# Patient Record
Sex: Female | Born: 1945
Health system: Southern US, Community
[De-identification: ages and names within clinical notes are randomized; demographics above are authoritative.]

## PROBLEM LIST (undated history)

## (undated) DIAGNOSIS — E7800 Pure hypercholesterolemia, unspecified: Secondary | ICD-10-CM

## (undated) DIAGNOSIS — N63 Unspecified lump in unspecified breast: Secondary | ICD-10-CM

## (undated) DIAGNOSIS — K219 Gastro-esophageal reflux disease without esophagitis: Secondary | ICD-10-CM

## (undated) DIAGNOSIS — M1990 Unspecified osteoarthritis, unspecified site: Secondary | ICD-10-CM

## (undated) DIAGNOSIS — Z87891 Personal history of nicotine dependence: Secondary | ICD-10-CM

## (undated) DIAGNOSIS — J439 Emphysema, unspecified: Secondary | ICD-10-CM

## (undated) DIAGNOSIS — R911 Solitary pulmonary nodule: Secondary | ICD-10-CM

## (undated) DIAGNOSIS — N390 Urinary tract infection, site not specified: Secondary | ICD-10-CM

## (undated) DIAGNOSIS — D219 Benign neoplasm of connective and other soft tissue, unspecified: Secondary | ICD-10-CM

## (undated) DIAGNOSIS — J449 Chronic obstructive pulmonary disease, unspecified: Secondary | ICD-10-CM

## (undated) DIAGNOSIS — M810 Age-related osteoporosis without current pathological fracture: Secondary | ICD-10-CM

## (undated) HISTORY — DX: Unspecified osteoarthritis, unspecified site: M19.90

## (undated) HISTORY — DX: Personal history of nicotine dependence: Z87.891

## (undated) HISTORY — PX: BREAST BIOPSY: SHX20

## (undated) HISTORY — DX: Pure hypercholesterolemia, unspecified: E78.00

## (undated) HISTORY — DX: Urinary tract infection, site not specified: N39.0

## (undated) HISTORY — DX: Benign neoplasm of connective and other soft tissue, unspecified: D21.9

## (undated) HISTORY — DX: Age-related osteoporosis without current pathological fracture: M81.0

## (undated) HISTORY — DX: Unspecified lump in unspecified breast: N63.0

## (undated) HISTORY — PX: TONSILLECTOMY: SHX5217

## (undated) HISTORY — PX: BREAST SURGERY: SHX581

---

## 1973-07-14 HISTORY — PX: BREAST BIOPSY: SHX20

## 1973-07-14 HISTORY — PX: BREAST EXCISIONAL BIOPSY: SUR124

## 1995-07-15 HISTORY — PX: ABDOMINAL HYSTERECTOMY: SHX81

## 2005-11-01 ENCOUNTER — Emergency Department: Payer: Self-pay | Admitting: Emergency Medicine

## 2006-02-15 IMAGING — CR DG ELBOW COMPLETE 3+V*L*
1 series · 6 of 6 positions shown · non-contrast
Comparison: none

REASON FOR EXAM: INJURY
COMMENTS:

[Series 1: view not recorded · 0.17mm/px · 6 of 6 slices shown]
[im 1/6]
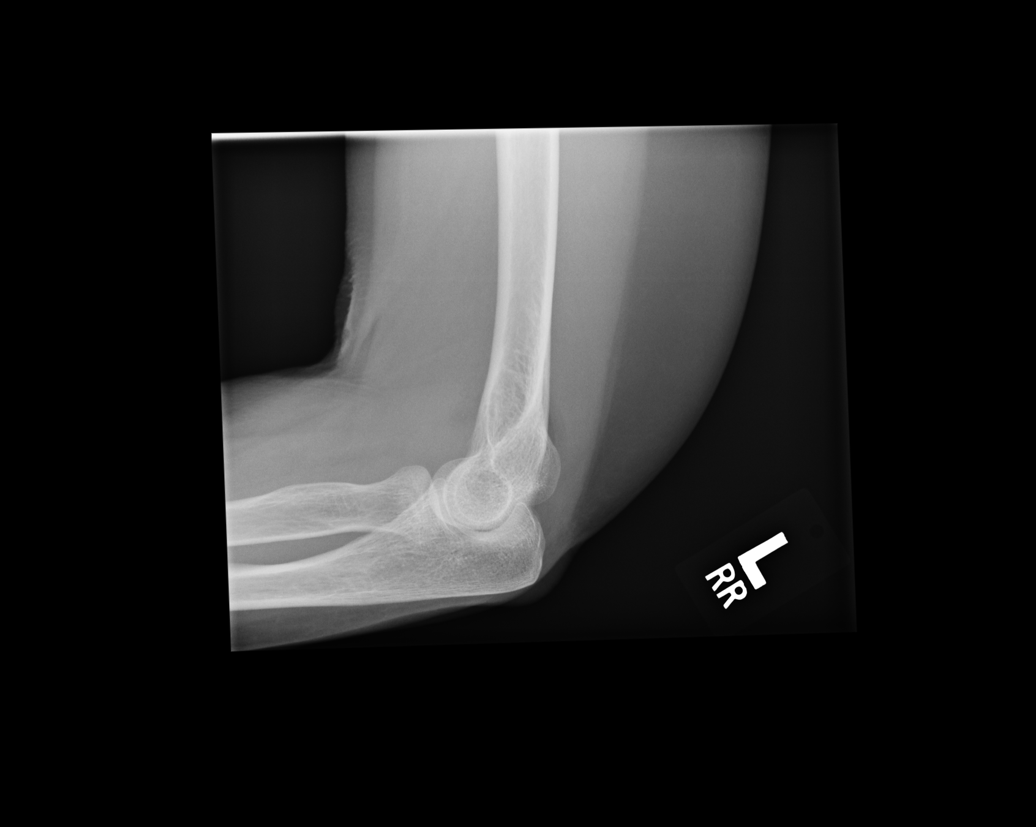
[im 2/6]
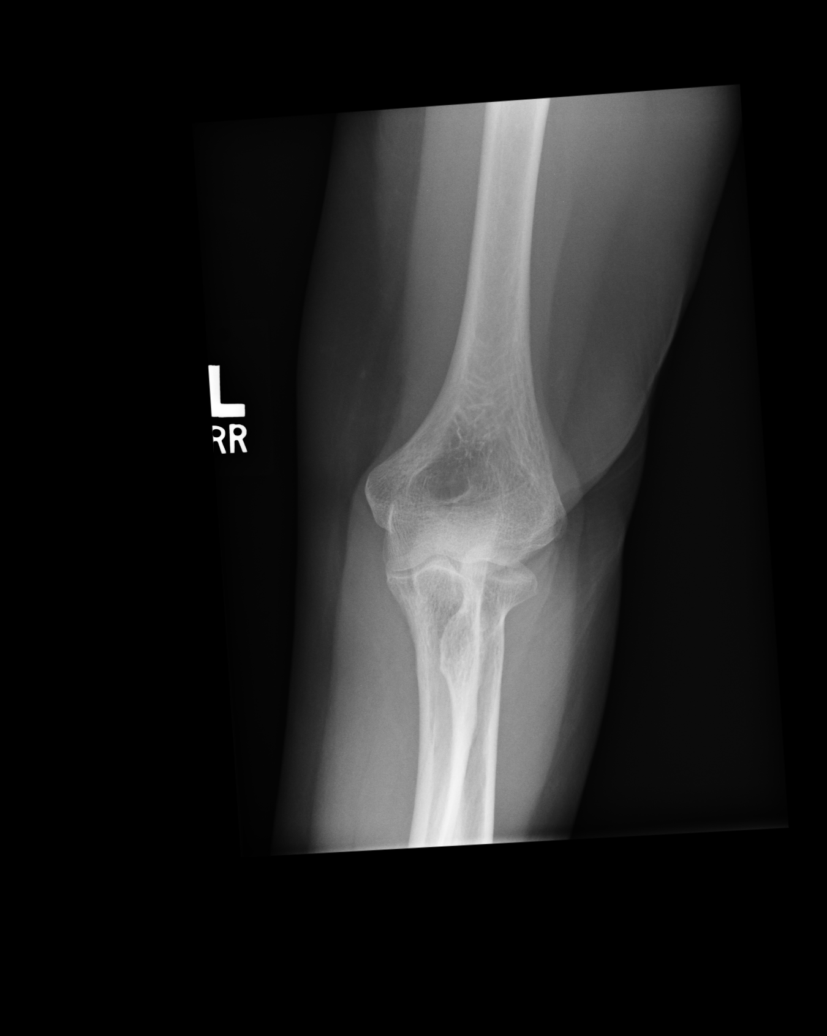
[im 3/6]
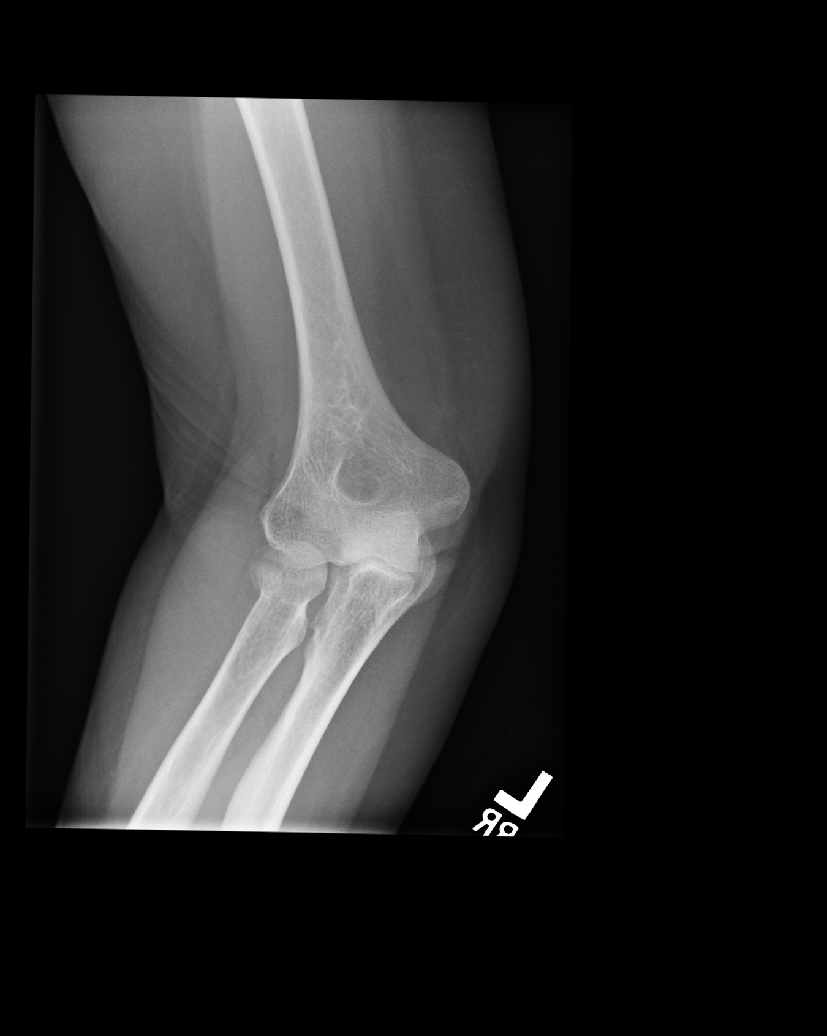
[im 4/6]
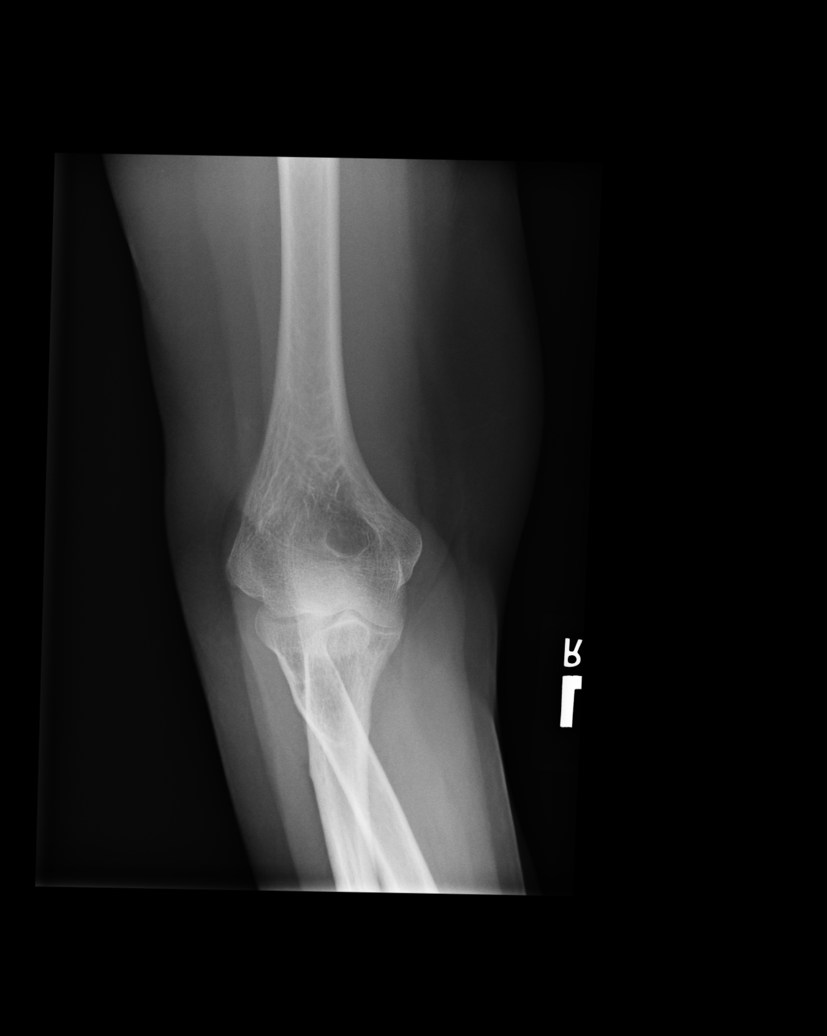
[im 5/6]
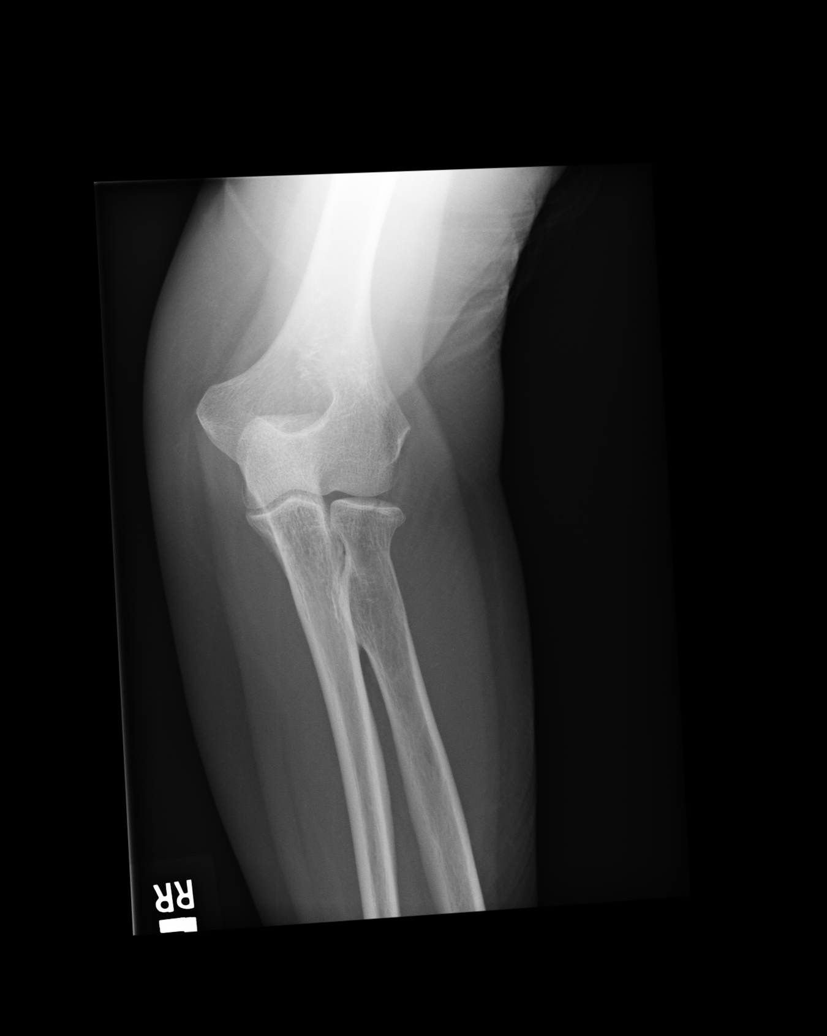
[im 6/6]
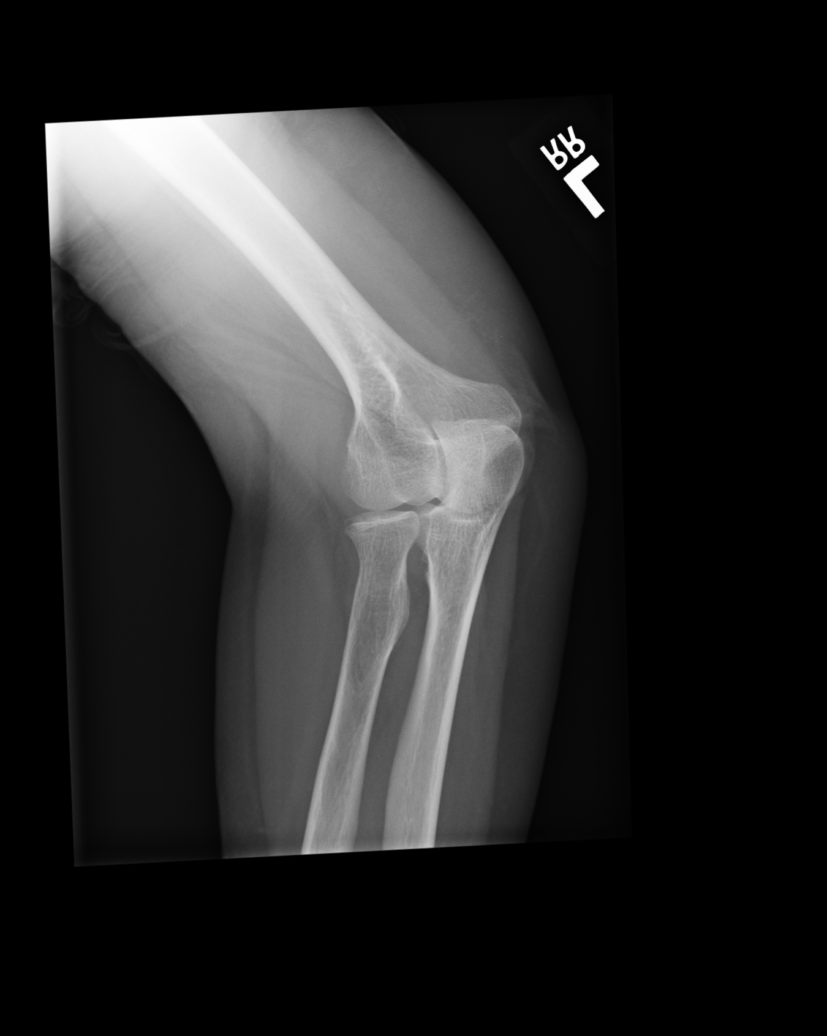

[6 of 6 positions shown; findings below may reference images not displayed]

PROCEDURE:     DXR - DXR ELBOW LT COMP W/OBLIQUES  - [DATE]  [DATE]

RESULT:          There is a posterior fat pad sign present.  There is
lucency through the radial head consistent with a fracture.  The fracture
appears nondisplaced and nondepressed.  The distal humerus and the olecranon
appear intact.
IMPRESSION: The patient has sustained a fracture through the radial
head on the LEFT.  There is a small joint effusion.

## 2006-07-03 ENCOUNTER — Ambulatory Visit: Payer: Self-pay | Admitting: Internal Medicine

## 2006-07-03 IMAGING — US US THYROID
1 series · 17 of 25 positions shown · non-contrast
Comparison: none

REASON FOR EXAM: Right thyroid fullness,  abdominal pain
COMMENTS:

[Series 1: us thyroid · 17 of 45 slices shown]
[im 1/45]
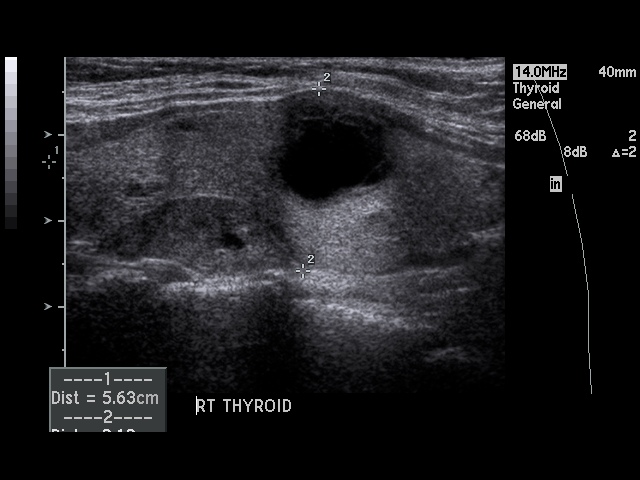
[im 4/45]
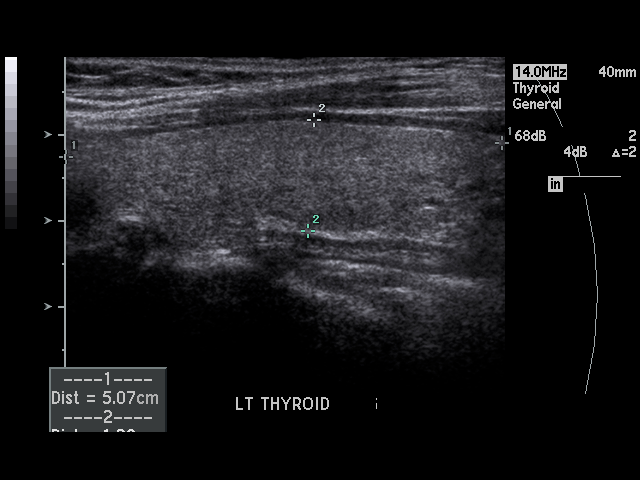
[im 6/45]
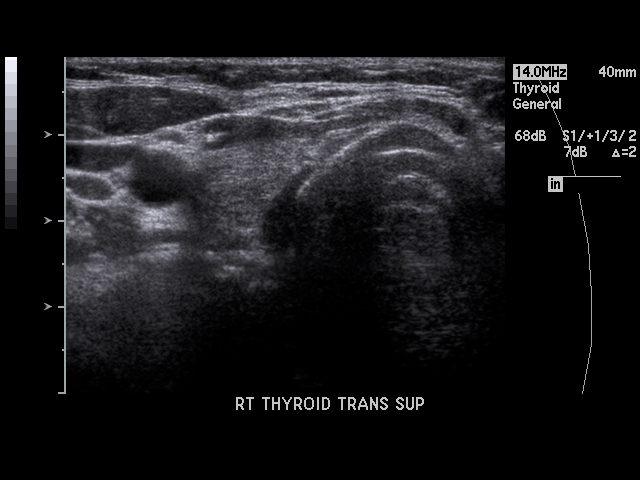
[im 10/45]
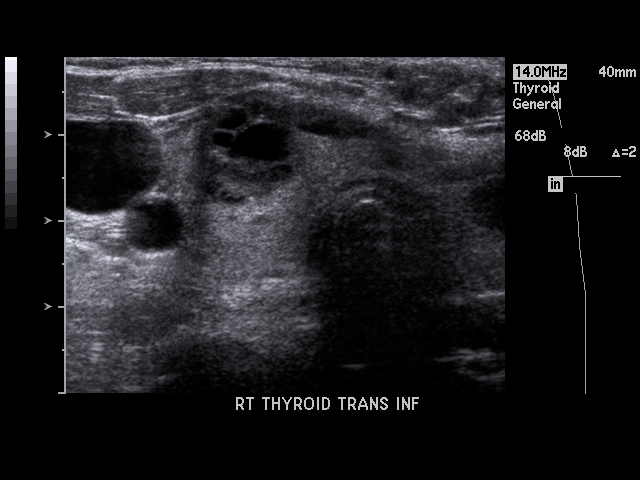
[im 12/45]
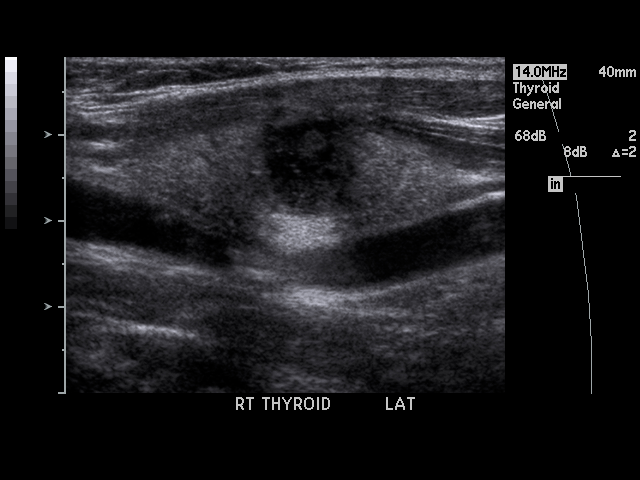
[im 15/45]
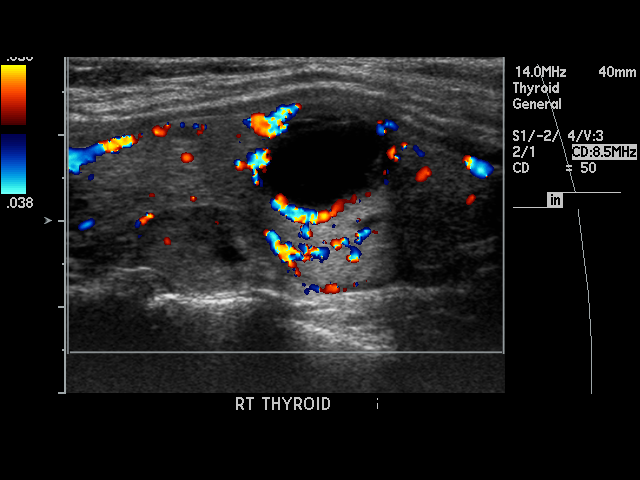
[im 17/45]
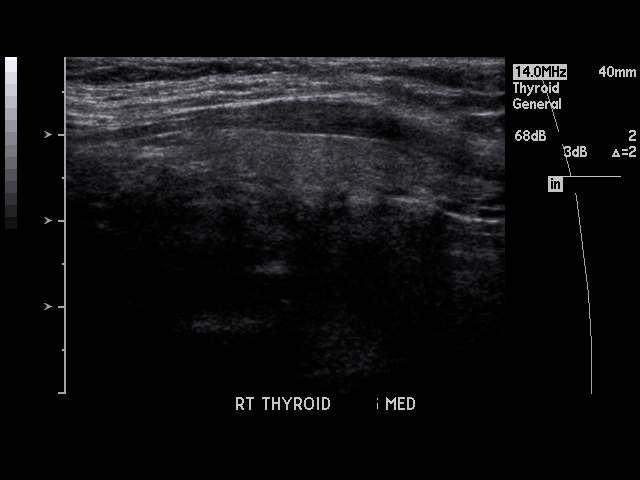
[im 21/45]
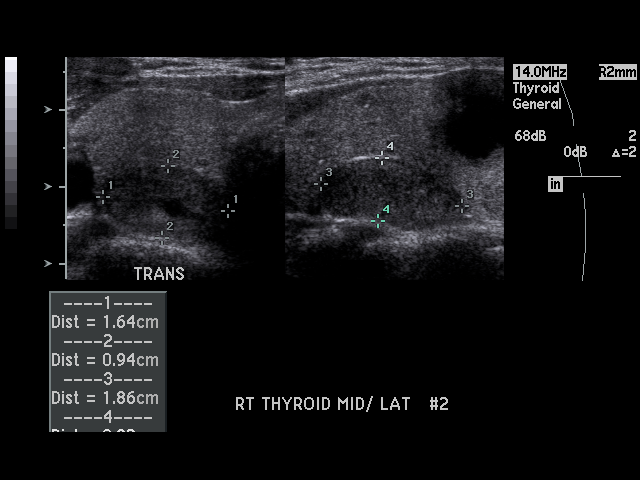
[im 23/45]
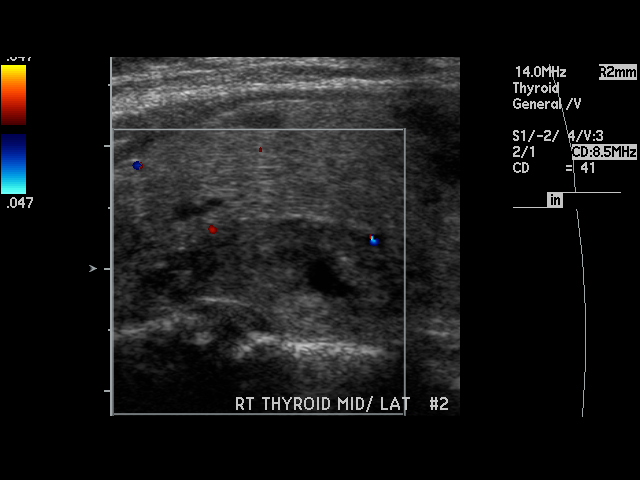
[im 24/45]
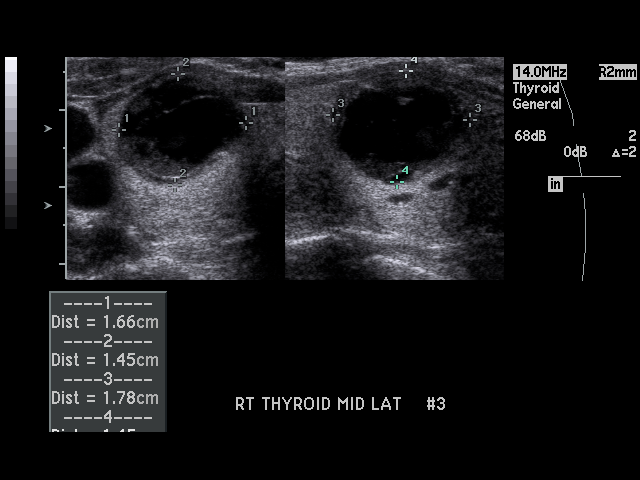
[im 28/45]
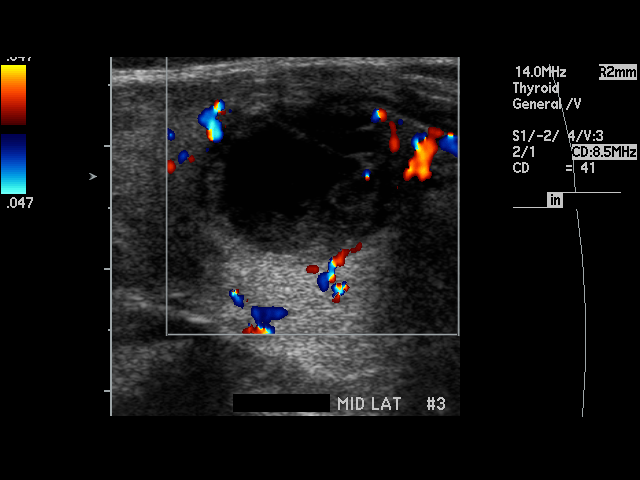
[im 30/45]
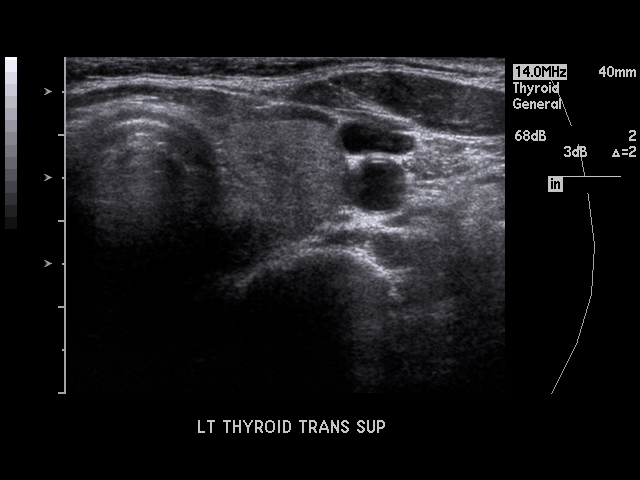
[im 34/45]
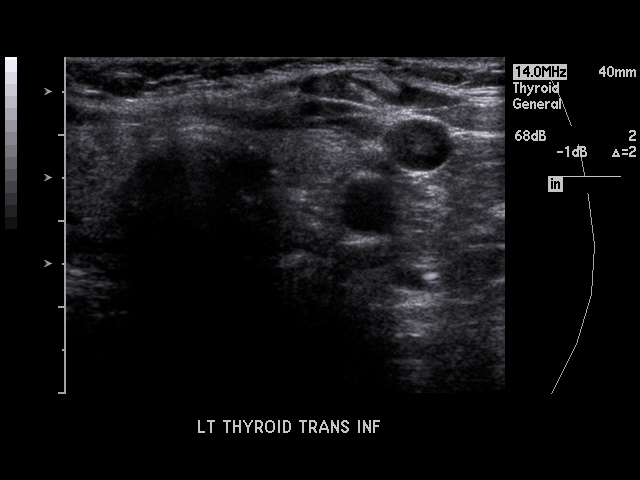
[im 35/45]
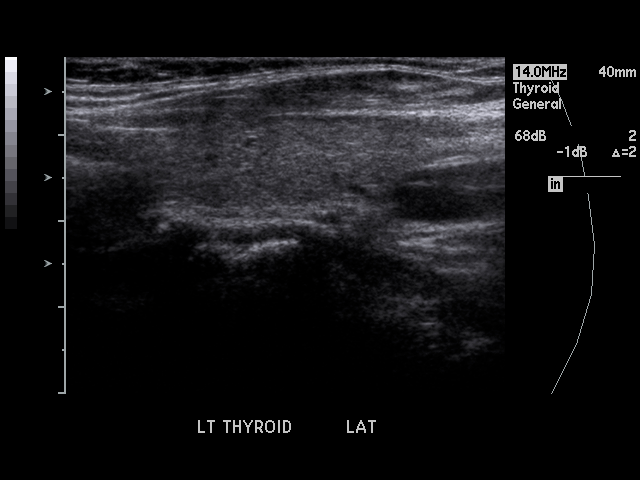
[im 39/45]
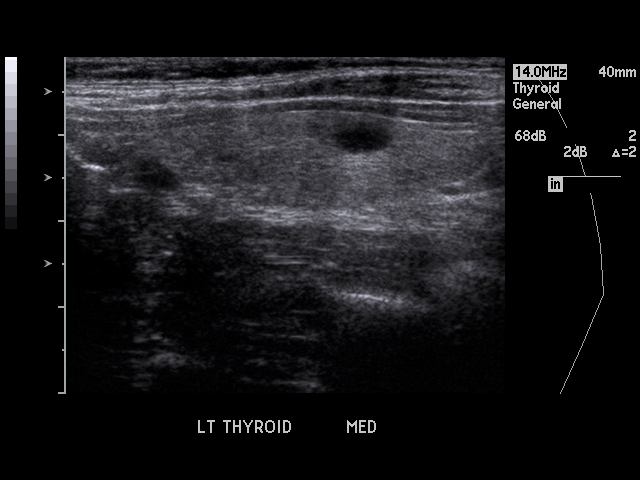
[im 41/45]
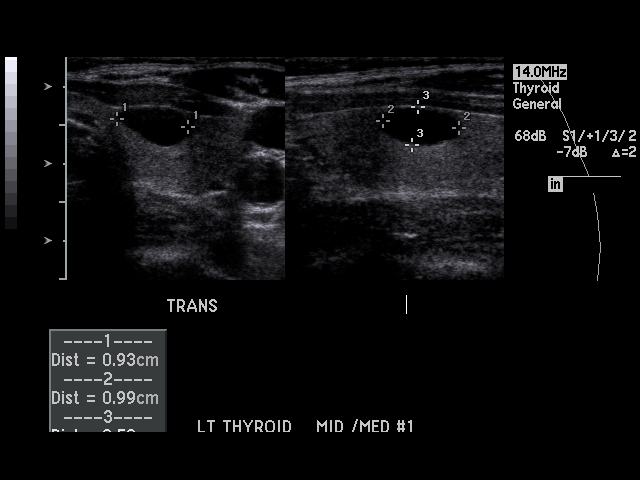
[im 45/45]
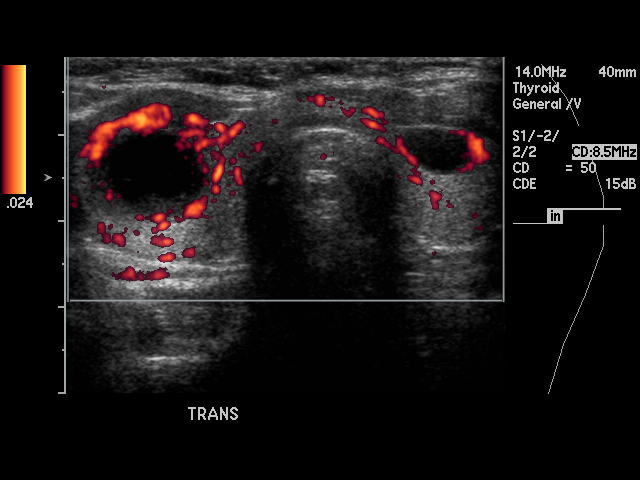

[17 of 25 positions shown; findings below may reference images not displayed]

PROCEDURE:     US  - US THYROID  - [DATE] [DATE]

RESULT:     The RIGHT lobe measures 5.63 cm x 2.2 cm x 2.2 cm and the LEFT
lobe measures 5.07 cm x 1.35 cm x 1.43 cm.  There are two complex cystic
nodules in the lower pole of the RIGHT lobe.  The larger measures 1.86 cm at
maximum diameter and the smaller measures 1.78 cm at maximum diameter.
Additionally there is a 3.0 mm cystic-appearing nodule in the upper pole of
the RIGHT lobe.  In the LEFT lobe there is a 1 cm cystic-appearing nodule in
the midpole region.  No other masses or nodules are identified.
IMPRESSION: The thyroid overall is upper limits for normal in size or slightly enlarged.

Multiple complex or cystic thyroid nodules are noted as mentioned above.
The most prominent nodules are on the RIGHT.

## 2006-07-03 IMAGING — US ABDOMEN ULTRASOUND
1 series · 17 of 25 positions shown · non-contrast
Comparison: none

REASON FOR EXAM: Right thyroid fullness, abdominal pain
COMMENTS:

[Series 1: abdomen ultrasound · 17 of 60 slices shown]
[im 1/60]
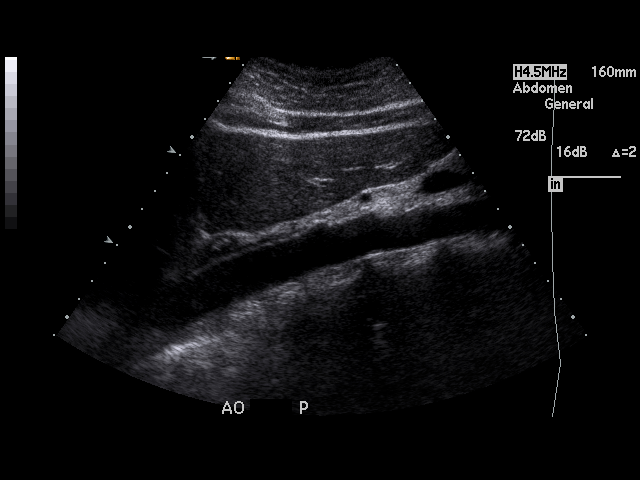
[im 5/60]
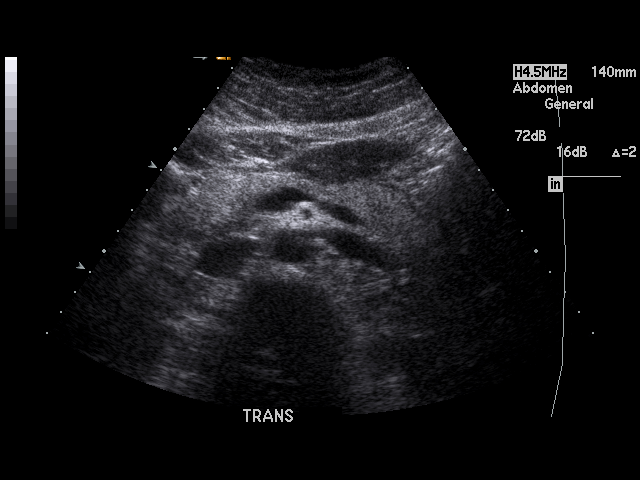
[im 8/60]
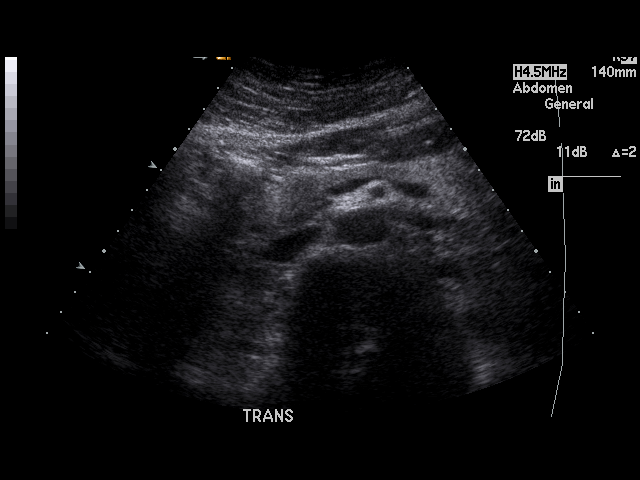
[im 13/60]
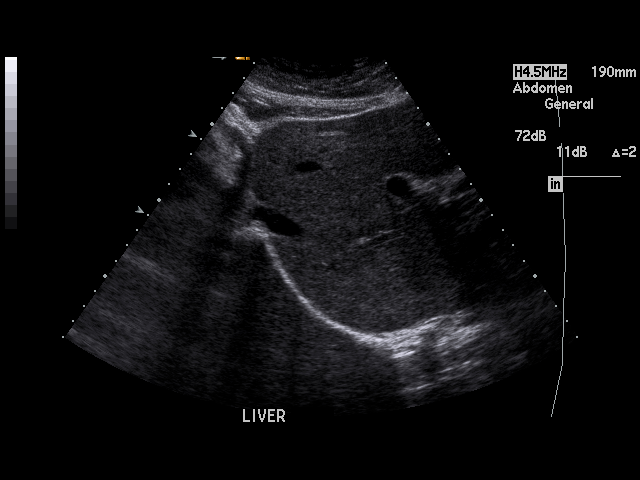
[im 15/60]
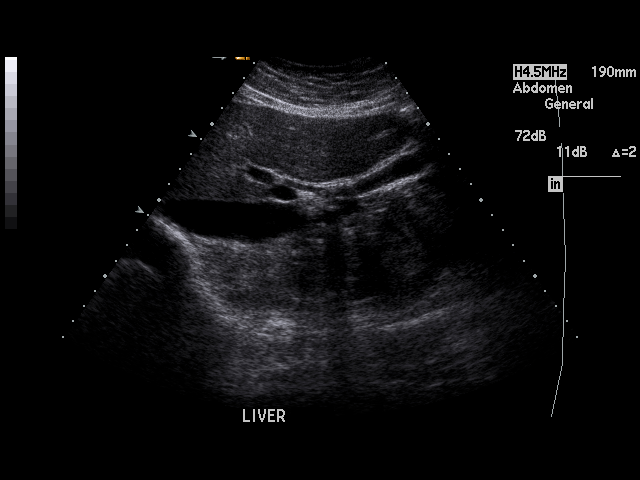
[im 20/60]
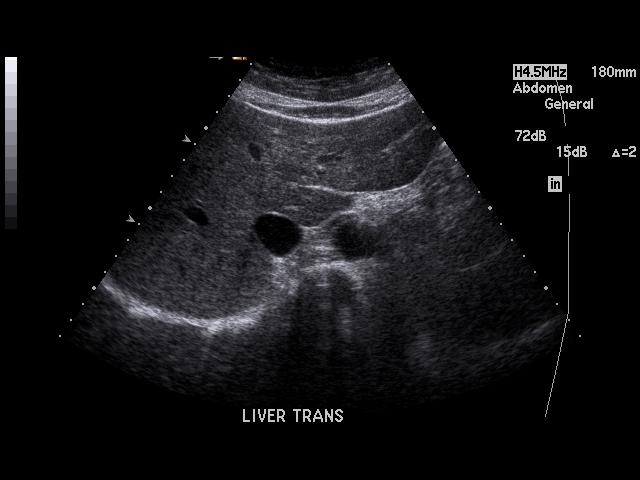
[im 23/60]
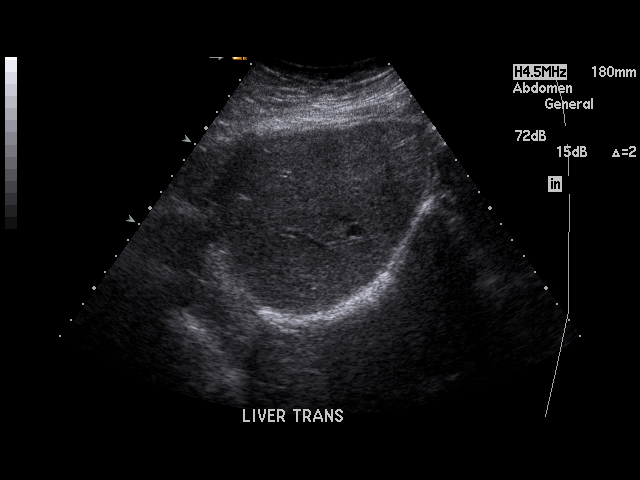
[im 28/60]
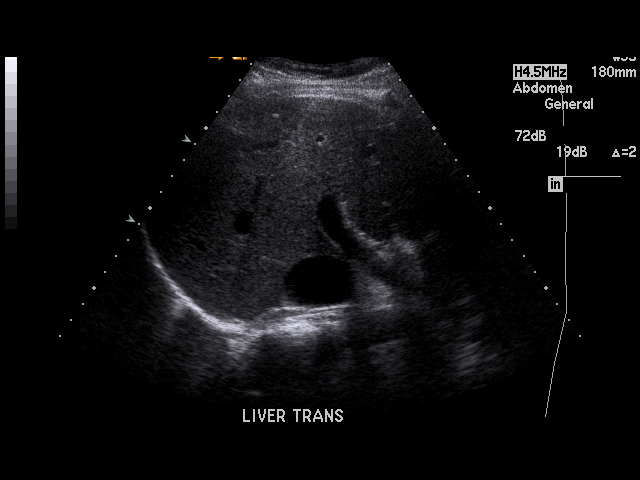
[im 30/60]
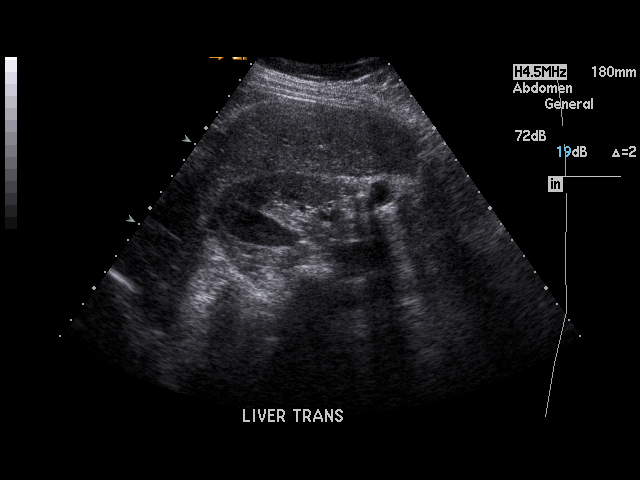
[im 32/60]
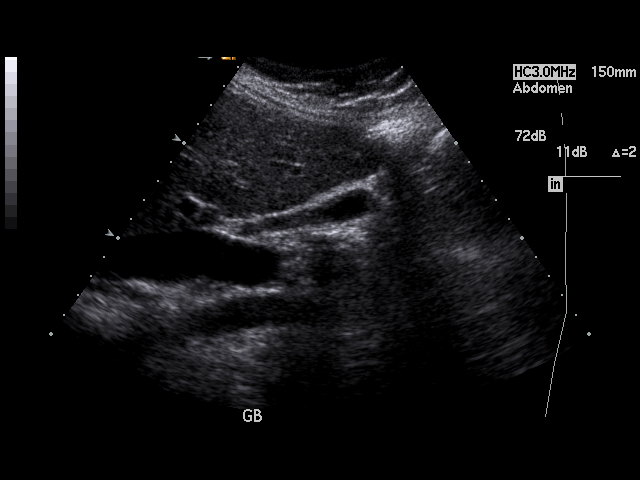
[im 37/60]
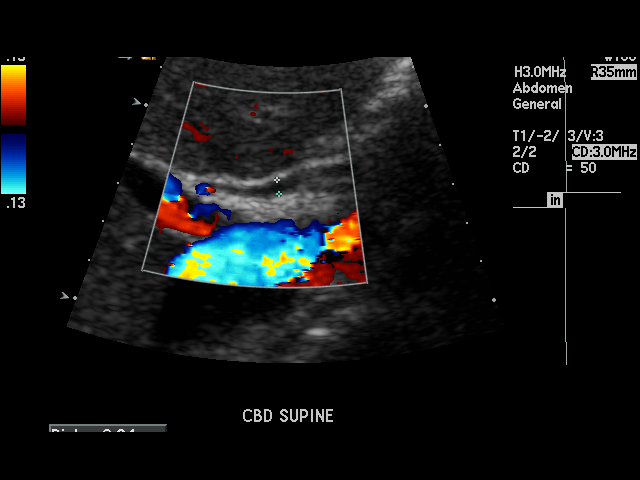
[im 40/60]
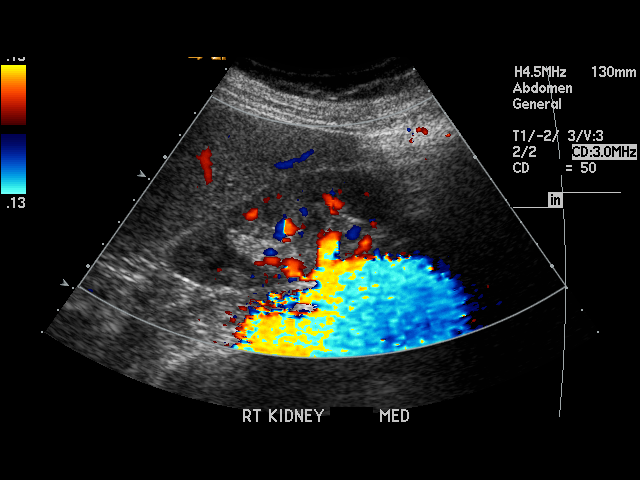
[im 45/60]
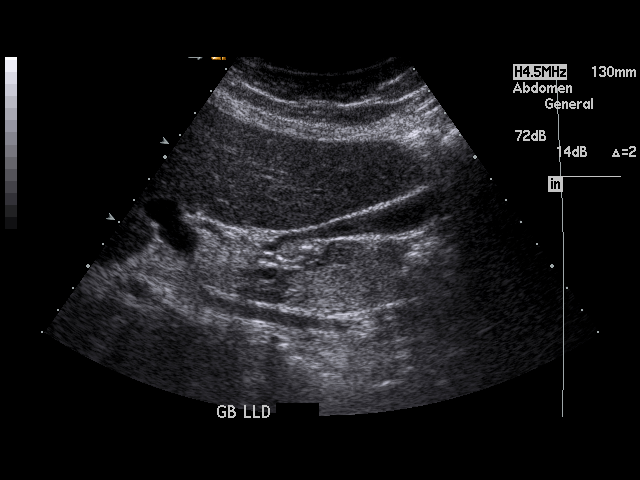
[im 47/60]
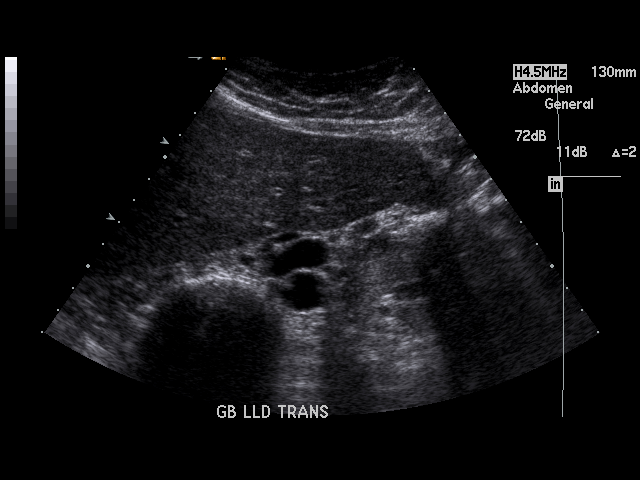
[im 52/60]
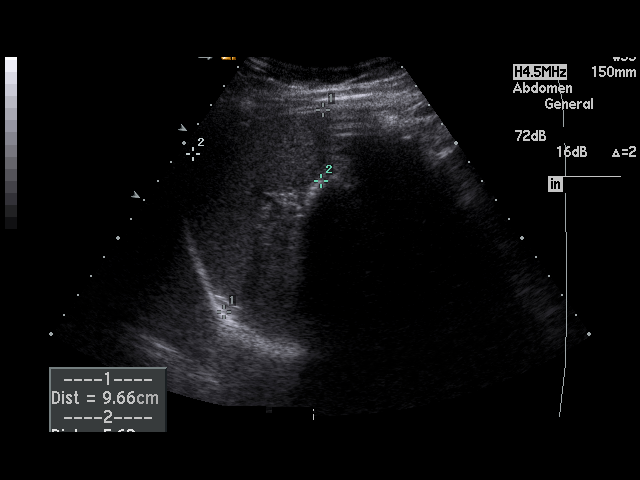
[im 55/60]
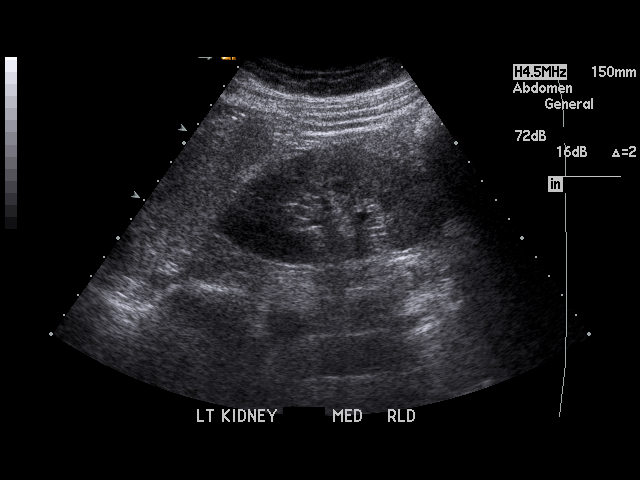
[im 60/60]
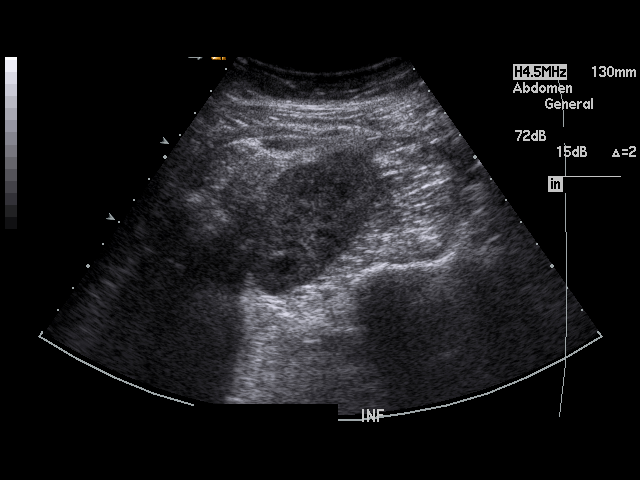

[17 of 25 positions shown; findings below may reference images not displayed]

PROCEDURE:     US  - US ABDOMEN GENERAL SURVEY  - [DATE] [DATE]

RESULT:     The liver, spleen, pancreas, and abdominal aorta show no
significant abnormalities.  The gallbladder is small and apparently
contracted.  Reportedly the patient had eaten prior to the exam.  No
thickening of the gallbladder wall is seen.  No definite gallstones are
seen.  The common bile duct measures 3.5 mm in diameter which is within
normal limits.  The kidneys show no hydronephrosis.  There is no ascites.
IMPRESSION: No significant abnormalities are noted.

## 2006-07-20 ENCOUNTER — Ambulatory Visit: Payer: Self-pay | Admitting: Internal Medicine

## 2006-07-20 IMAGING — US ULTRASOUND LEFT BREAST
1 series · 10 of 10 positions shown · non-contrast
Comparison: none

REASON FOR EXAM: pain and thickening left breast   US if needed
COMMENTS:

PROCEDURE:     US  - US BREAST LEFT  - [DATE]  [DATE]
RESULT:     Focused breast ultrasound is performed over the area of pain at
3 o'clock near the areolar region. There is noted a dilated duct measuring
.137 cm. No masses are noted. No nodules are noted within the duct.

[Series 1: ultrasound left breast · 10 of 10 slices shown]
[im 1/10]
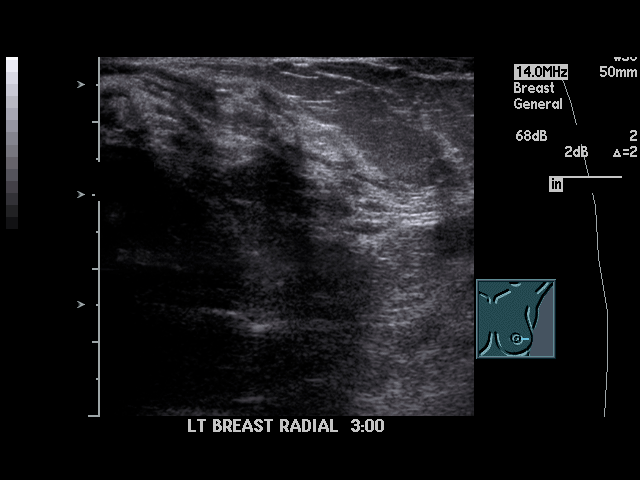
[im 2/10]
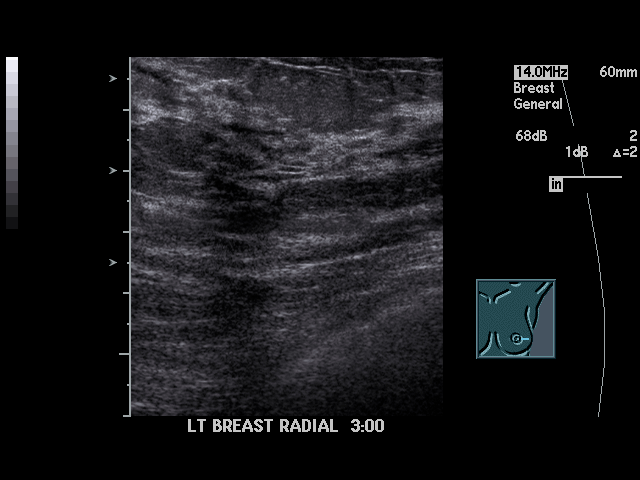
[im 3/10]
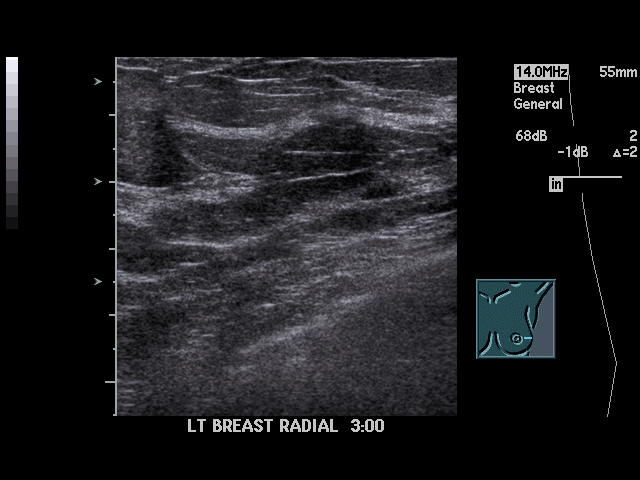
[im 4/10]
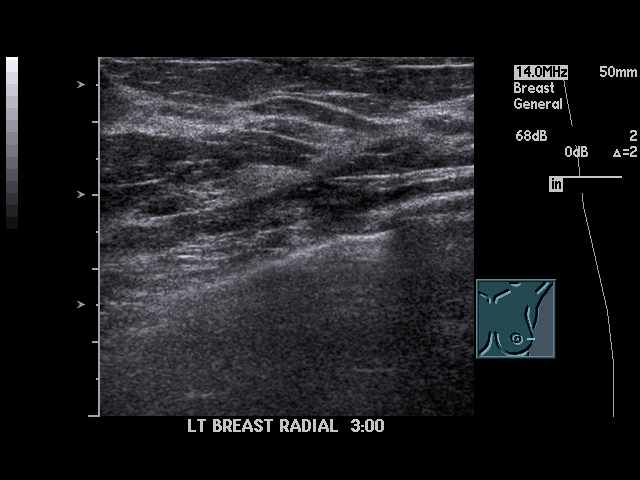
[im 5/10]
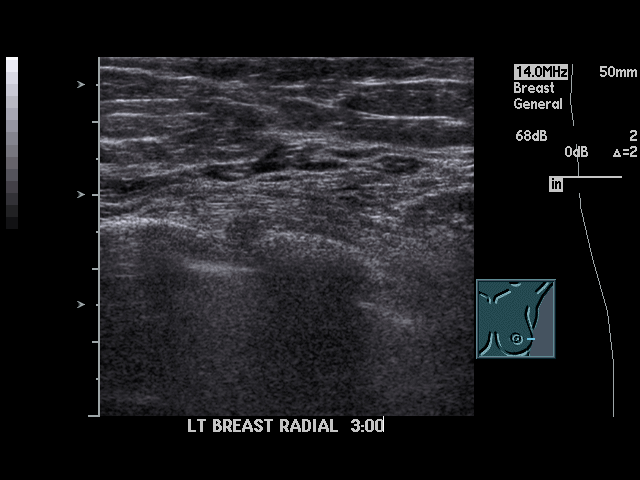
[im 6/10]
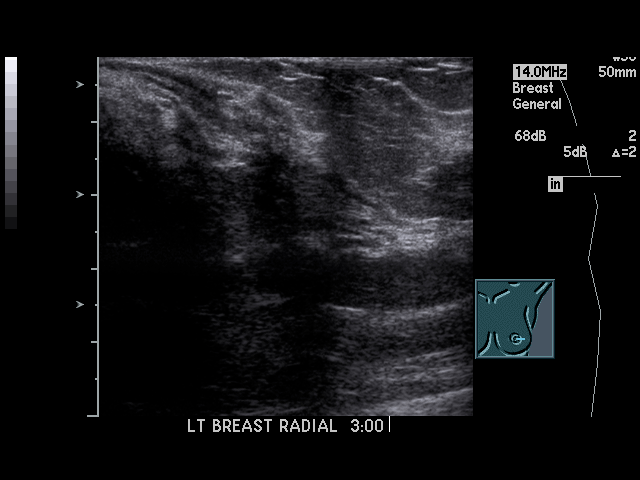
[im 7/10]
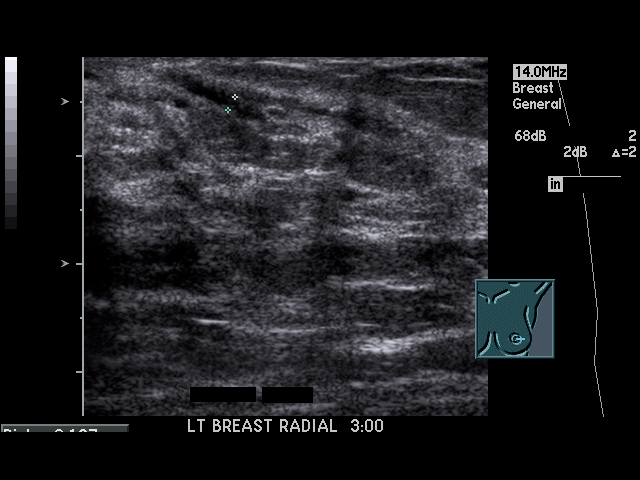
[im 8/10]
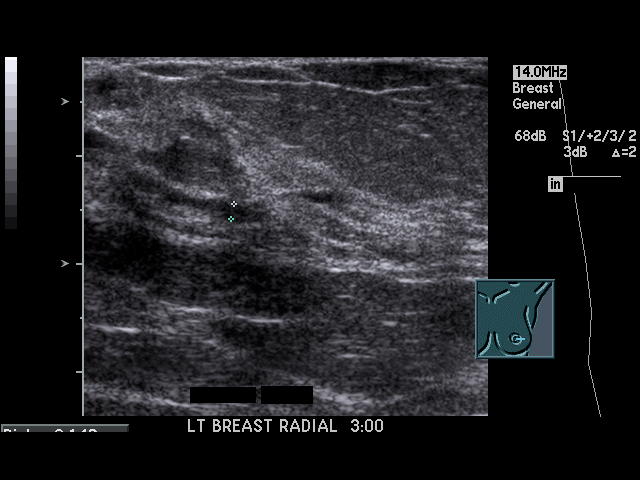
[im 9/10]
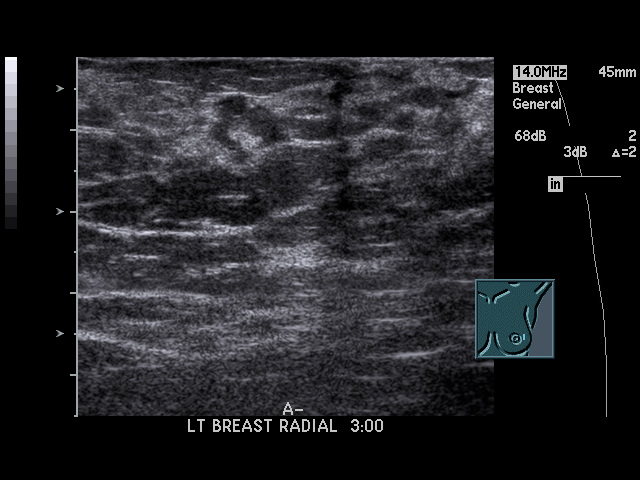
[im 10/10]
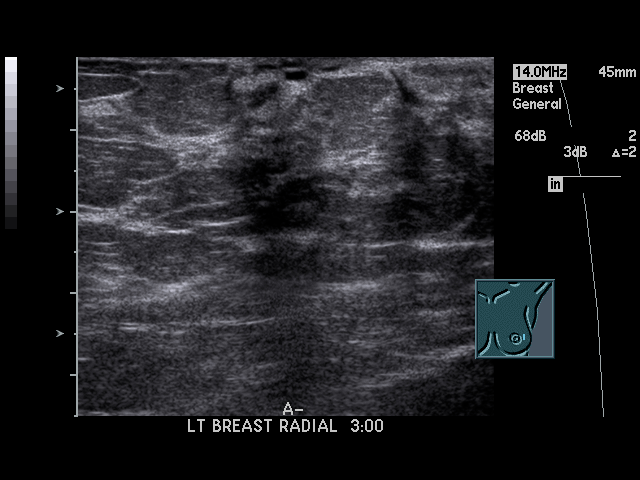

[10 of 10 positions shown; findings below may reference images not displayed]

IMPRESSION: 1)No masses are seen on the ultrasound.

2)Dilated duct is noted.

## 2006-08-03 ENCOUNTER — Ambulatory Visit: Payer: Self-pay | Admitting: General Surgery

## 2006-08-03 IMAGING — CT CT NECK-CHEST W/O CM
2 series · 10 of 14 positions shown, 12 images · non-contrast
Comparison: none

REASON FOR EXAM: Thyroid nodule, sternum mass   Dr ROBLA with Dr ROBLA
on contrast
COMMENTS:

[Series 2: soft tissue · axial · 0.69mm/px · z∈[-456,-114]mm · 7 of 152 slices shown, 9 images]
[im 19/152  soft-tissue]
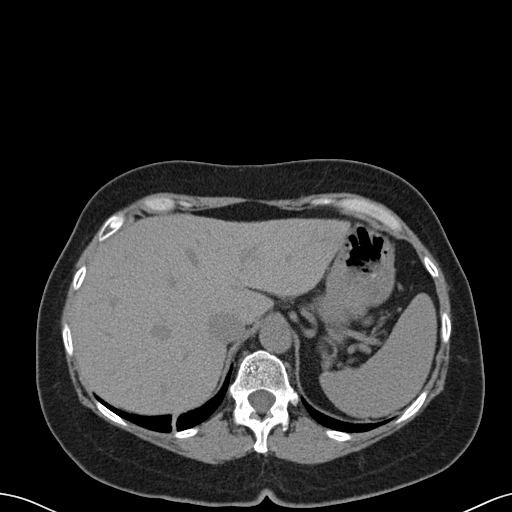
[im 19/152  bone]
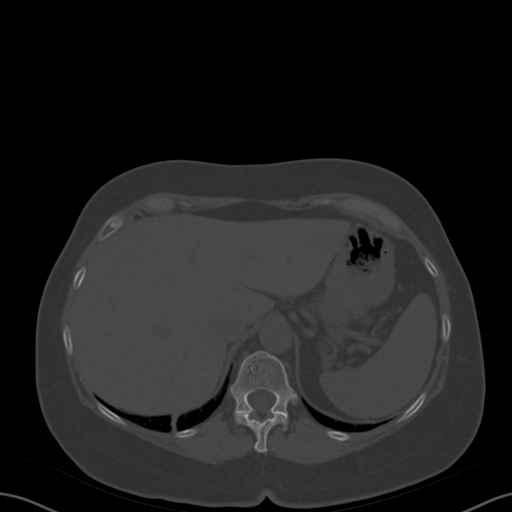
[im 38/152  bone]
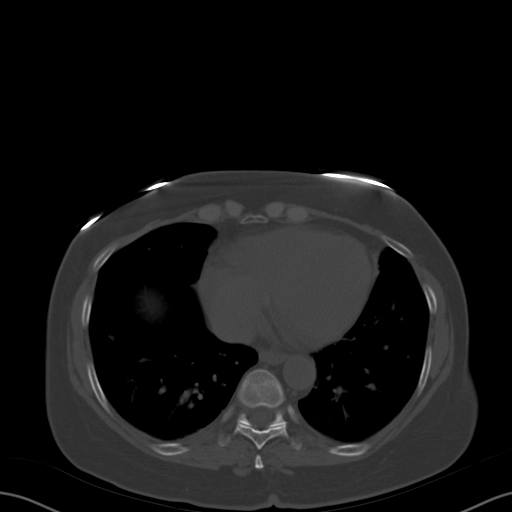
[im 57/152  bone]
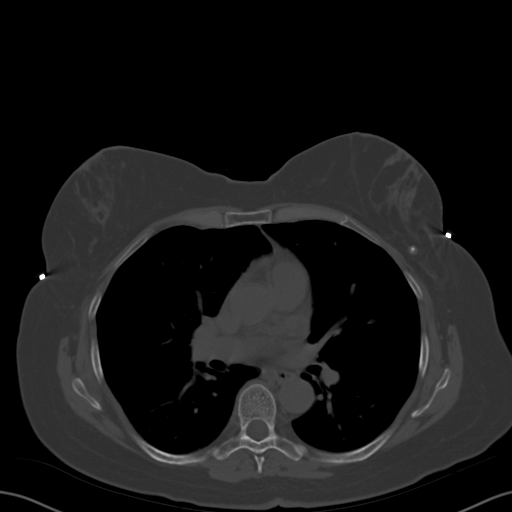
[im 76/152  bone]
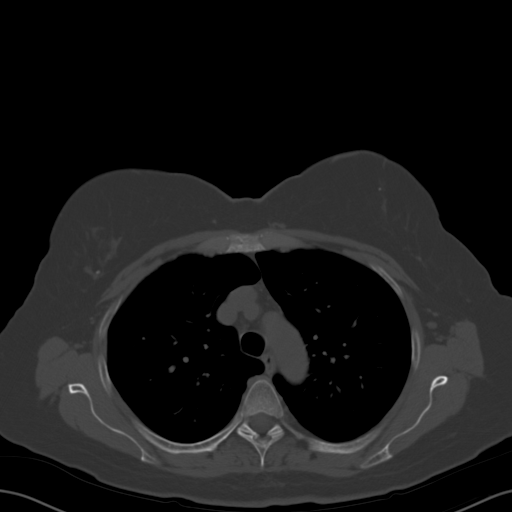
[im 95/152  soft-tissue]
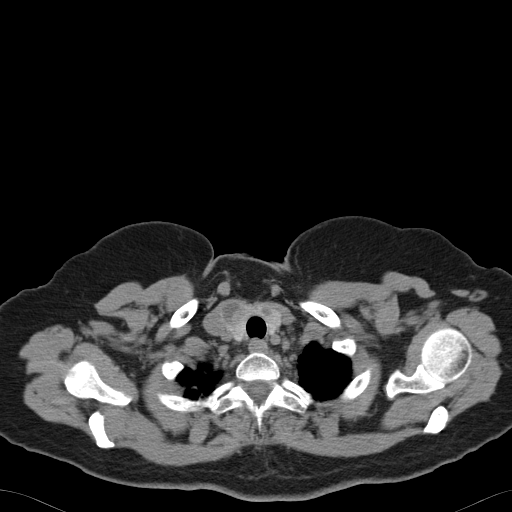
[im 95/152  bone]
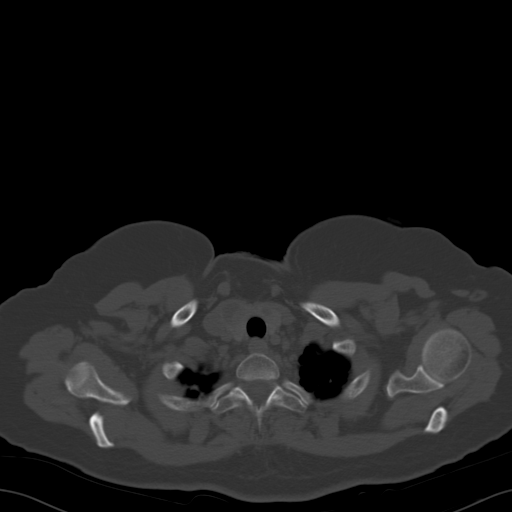
[im 114/152  bone]
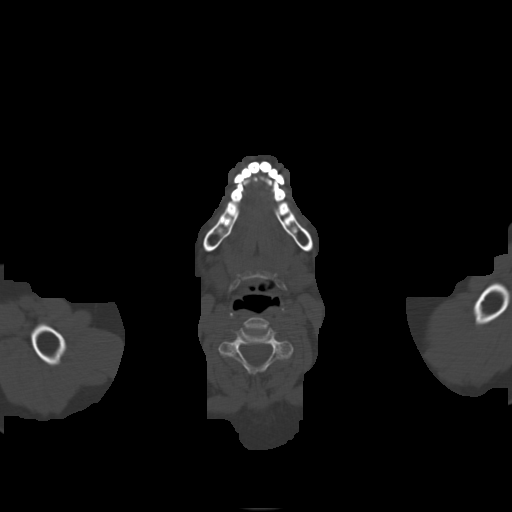
[im 133/152  bone]
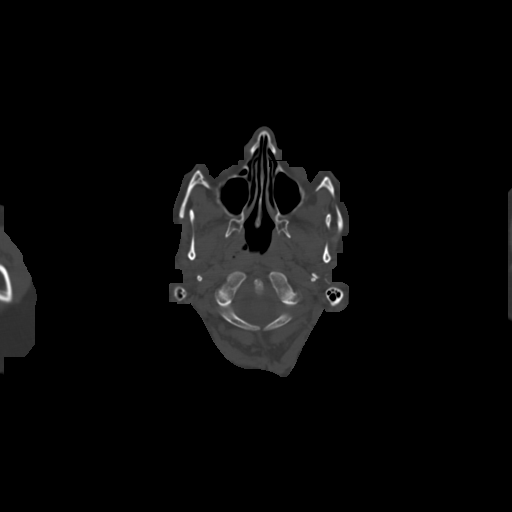

[Series 4: lung windows · axial · 0.65mm/px · z∈[-416,-281]mm · 3 of 91 slices shown]
[im 23/91  bone]
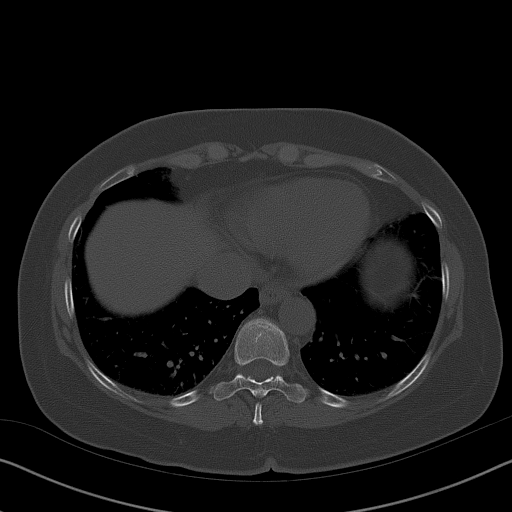
[im 46/91  bone]
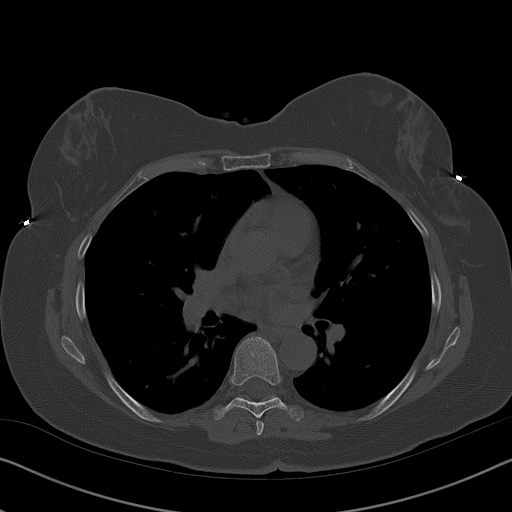
[im 68/91  bone]
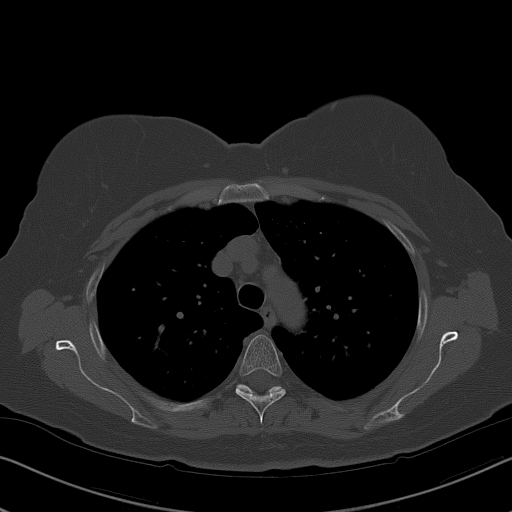

[10 of 14 positions shown; findings below may reference images not displayed]

PROCEDURE:     CT  - CT NECK / CHEST WITHOUT  - [DATE]  [DATE]

RESULT:       Helical noncontrast 3 mm sections were obtained from the skull
base through the thoracic inlet.

Evaluation of the neck portion demonstrates no evidence of masses,
adenopathy, free fluid or drainable loculated fluid collections.
Subcentimeter 3-4 mm lymph nodes are demonstrated within the posterior
cervical chain and to a lesser extent in the anterior cervical chain.  A
1.74 x 1.81 cm nodule is appreciated within the anterior medial portion of
the RIGHT lobe of the thyroid.   A smaller 8-9 mm nodule is demonstrated
within the base of the LEFT lobe of the thyroid.  Noncontrast evaluation of
the mediastinum, hilar regions and structures demonstrates no evidence of
mediastinal or hilar adenopathy or masses.  The lung parenchyma demonstrates
linear areas of increased density within the lung base as well as an element
of interstitial prominence.  No focal regions of consolidation are
demonstrated.   The visualized upper abdominal viscera demonstrate no gross
noncontrast abnormalities.
IMPRESSION: 1.     Masses involving the RIGHT and LEFT lobe of the thyroid. More
dominant mass on the RIGHT as described above.
2.     Subcentimeter shotty lymph nodes within the neck.
3.     Atelectasis versus scarring within the lung bases.
4.     Note, images were obtained utilizing a high definition bone
algorithm.  The visualized bony skeleton appears to be intact.

## 2007-08-27 ENCOUNTER — Ambulatory Visit: Payer: Self-pay | Admitting: Internal Medicine

## 2008-07-14 HISTORY — PX: COLONOSCOPY: SHX174

## 2008-08-31 ENCOUNTER — Ambulatory Visit: Payer: Self-pay | Admitting: Internal Medicine

## 2008-09-22 ENCOUNTER — Ambulatory Visit: Payer: Self-pay | Admitting: Unknown Physician Specialty

## 2009-09-05 ENCOUNTER — Ambulatory Visit: Payer: Self-pay | Admitting: Internal Medicine

## 2010-04-15 ENCOUNTER — Ambulatory Visit: Payer: Self-pay | Admitting: Unknown Physician Specialty

## 2010-04-15 IMAGING — CT CT CHEST W/ CM
1 series · 16 of 33 positions shown, 20 images · IV contrast (APPLIED)
Comparison: none

REASON FOR EXAM: SOB MODIG HX OF SMOKING
COMMENTS:

[Series 4: pe_avg 3.0 i41f · axial · 0.67mm/px · z∈[-681,-381]mm · 16 of 109 slices shown, 20 images]
[im 5/109  mediastinal]
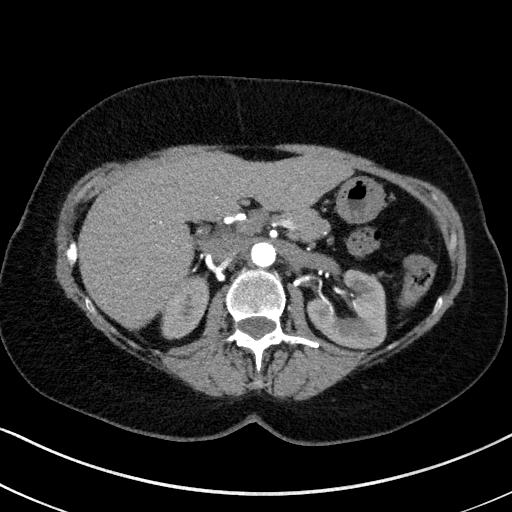
[im 5/109  lung]
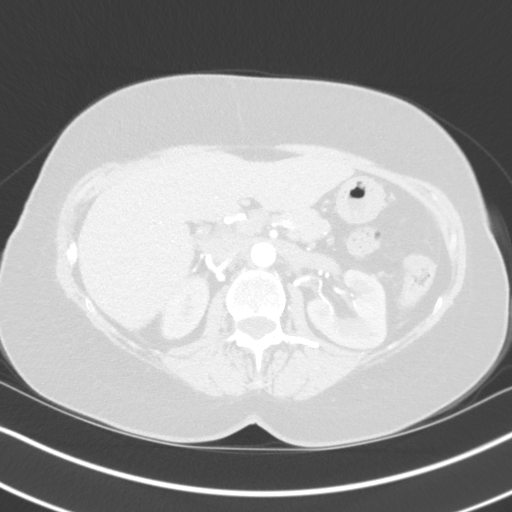
[im 13/109  lung]
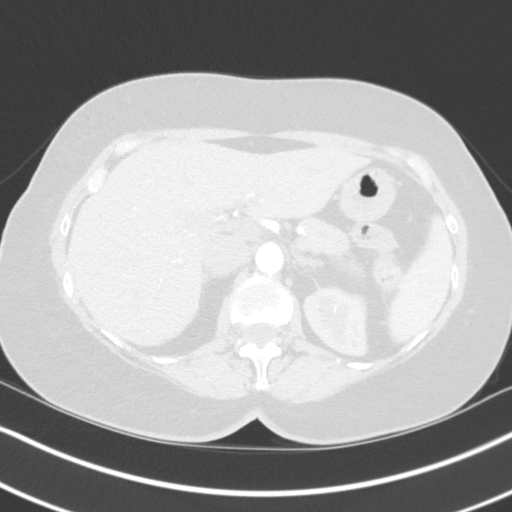
[im 21/109  lung]
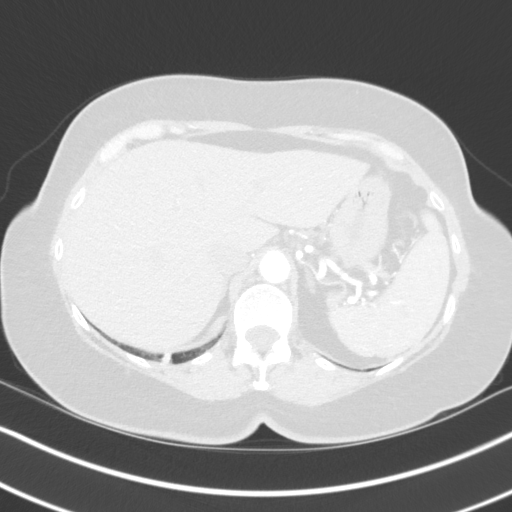
[im 25/109  lung]
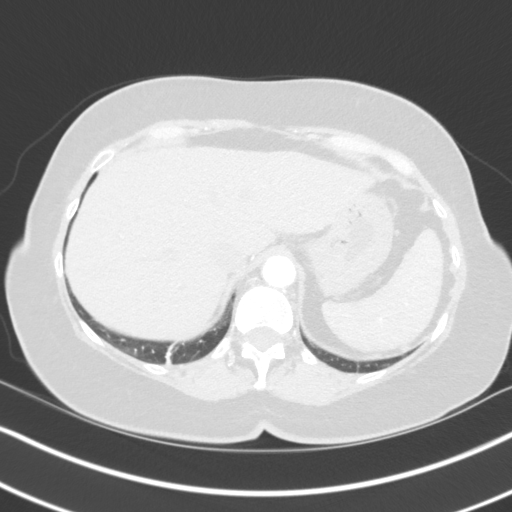
[im 33/109  mediastinal]
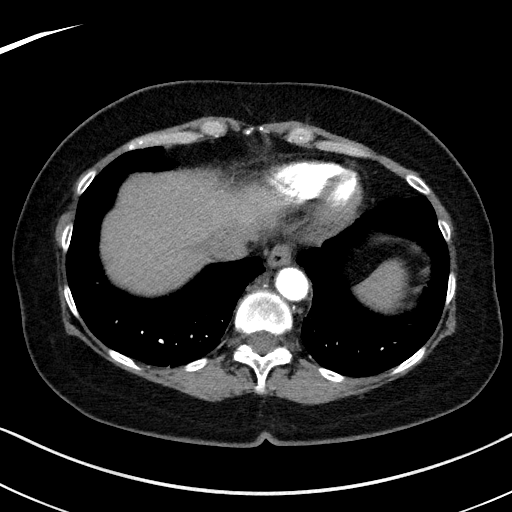
[im 33/109  lung]
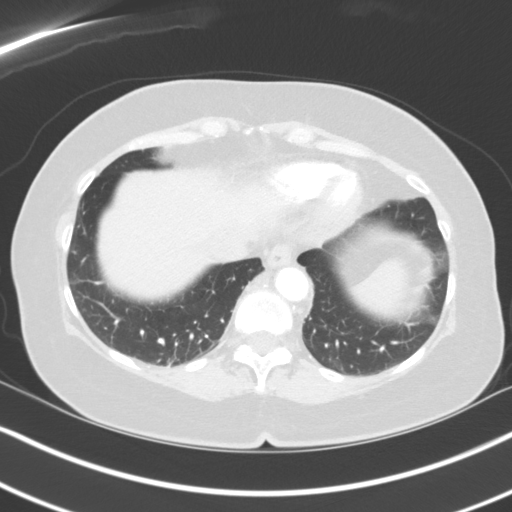
[im 41/109  lung]
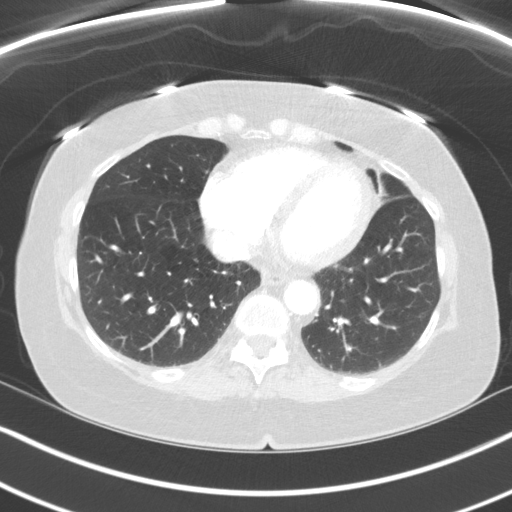
[im 45/109  lung]
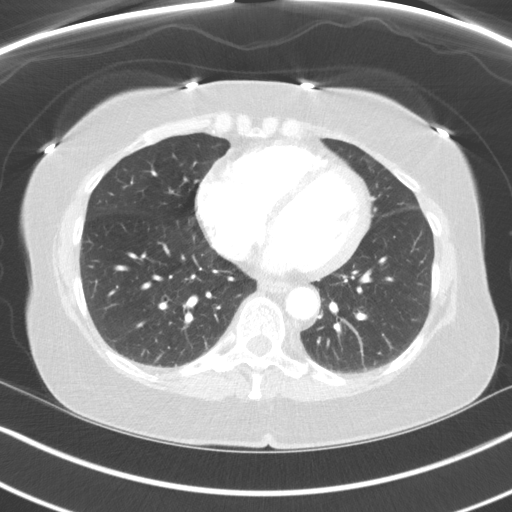
[im 52/109  lung]
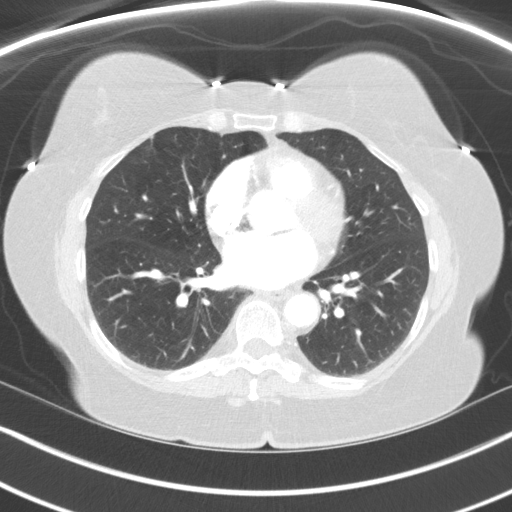
[im 57/109  mediastinal]
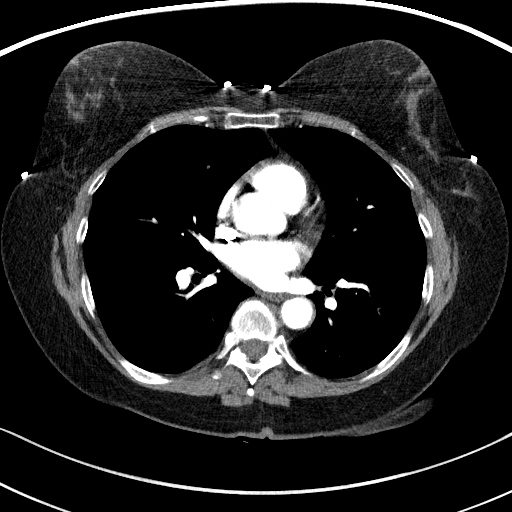
[im 57/109  lung]
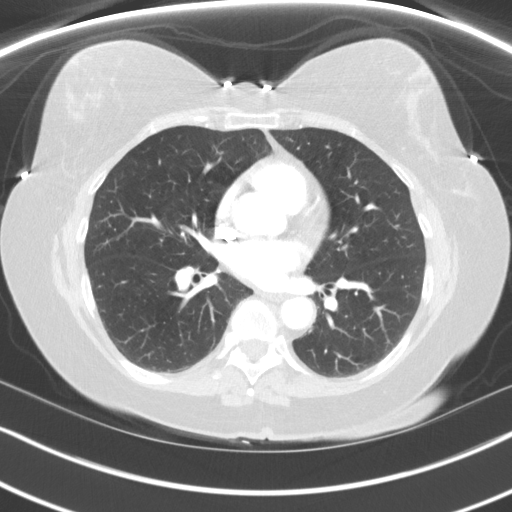
[im 61/109  lung]
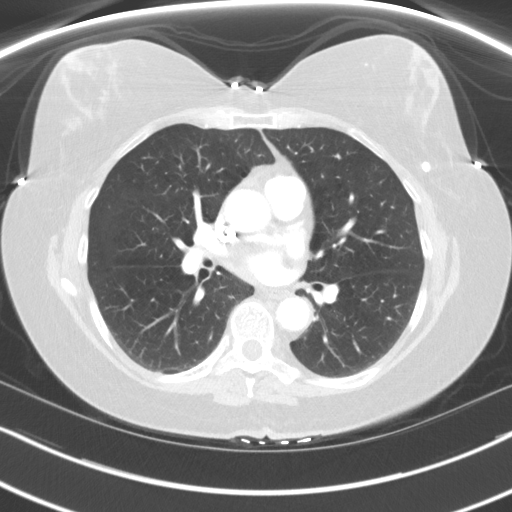
[im 69/109  lung]
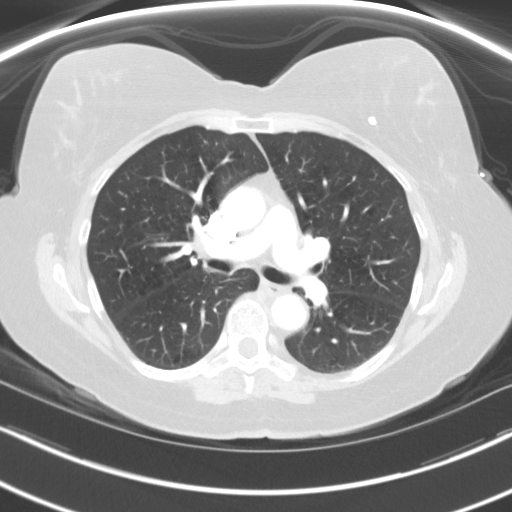
[im 77/109  lung]
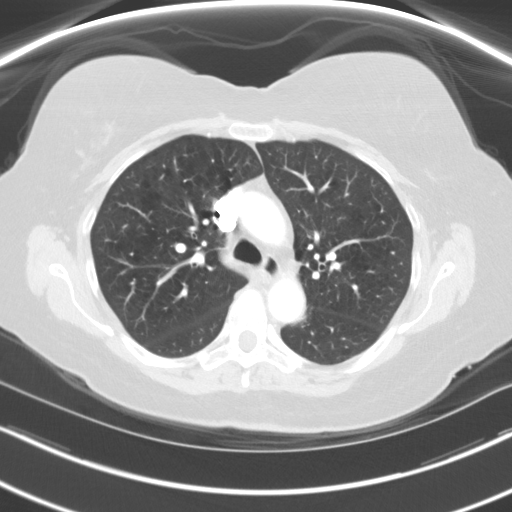
[im 85/109  mediastinal]
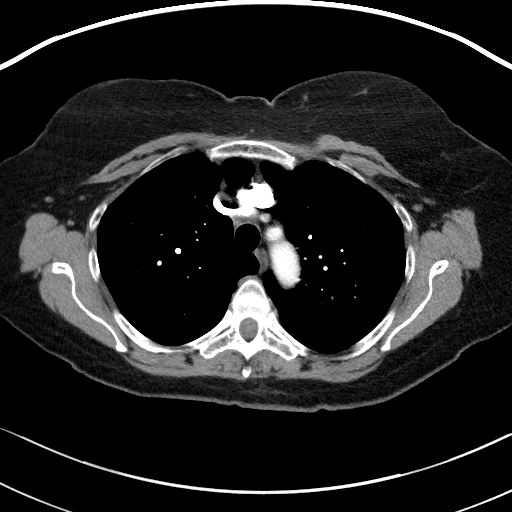
[im 85/109  lung]
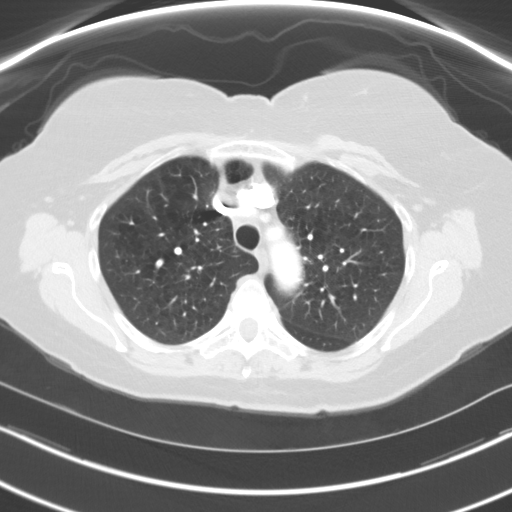
[im 89/109  lung]
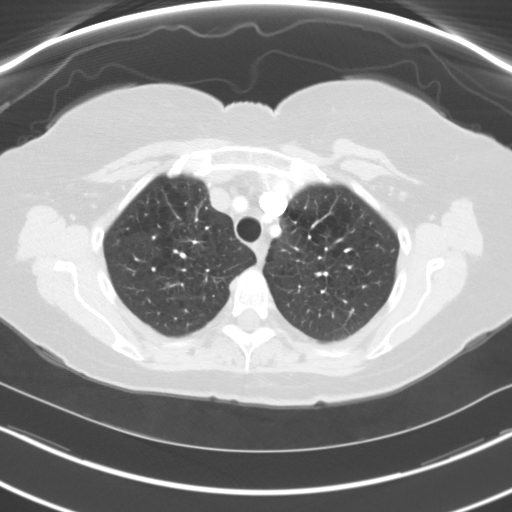
[im 97/109  lung]
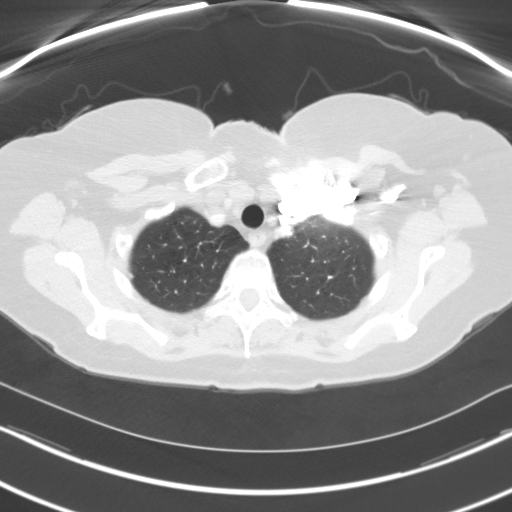
[im 105/109  lung]
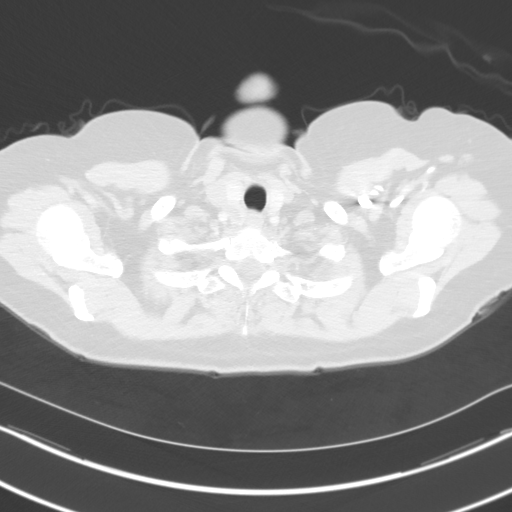

[16 of 33 positions shown; findings below may reference images not displayed]

PROCEDURE:     KCT - KCT CHEST WITH CONTRAST  - [DATE] [DATE]

RESULT:     Axial CT scanning was performed through the chest at 3 mm
intervals and slice thicknesses. The patient received 75 cc of [LN].
Review of multiplanar reconstructed images was performed separately on the
VIA monitor.

Contrast within the pulmonary arterial tree is normal in appearance. I do
not see evidence of an acute pulmonary embolism. The caliber of the thoracic
aorta is normal. I see no false lumen. The cardiac chambers are normal in
size. There is no pleural nor pericardial effusion. I see no mediastinal nor
hilar lymphadenopathy.

At lung window settings I see no interstitial nor alveolar infiltrates. No
pulmonary parenchymal nodules are identified.

Within the upper abdomen the observed portions of the liver and spleen are
normal. I see no adrenal masses.
IMPRESSION: 1. I do not see evidence of acute pulmonary embolism.
2. There is no evidence of CHF nor of pneumonia.
3. I see no pulmonary parenchymal masses.

## 2010-05-14 ENCOUNTER — Ambulatory Visit: Payer: Self-pay | Admitting: Internal Medicine

## 2010-09-09 ENCOUNTER — Ambulatory Visit: Payer: Self-pay | Admitting: Internal Medicine

## 2011-09-12 ENCOUNTER — Ambulatory Visit: Payer: Self-pay | Admitting: Internal Medicine

## 2012-06-15 ENCOUNTER — Encounter: Payer: Self-pay | Admitting: Internal Medicine

## 2012-06-18 ENCOUNTER — Ambulatory Visit (INDEPENDENT_AMBULATORY_CARE_PROVIDER_SITE_OTHER): Payer: No Typology Code available for payment source | Admitting: Internal Medicine

## 2012-06-18 ENCOUNTER — Encounter: Payer: Self-pay | Admitting: Internal Medicine

## 2012-06-18 VITALS — BP 108/72 | HR 70 | Temp 97.6°F | Ht 63.5 in | Wt 180.5 lb

## 2012-06-18 DIAGNOSIS — Z139 Encounter for screening, unspecified: Secondary | ICD-10-CM

## 2012-06-18 DIAGNOSIS — R5381 Other malaise: Secondary | ICD-10-CM

## 2012-06-18 DIAGNOSIS — R5383 Other fatigue: Secondary | ICD-10-CM

## 2012-06-18 DIAGNOSIS — E0789 Other specified disorders of thyroid: Secondary | ICD-10-CM

## 2012-06-18 DIAGNOSIS — M810 Age-related osteoporosis without current pathological fracture: Secondary | ICD-10-CM | POA: Insufficient documentation

## 2012-06-18 DIAGNOSIS — M8580 Other specified disorders of bone density and structure, unspecified site: Secondary | ICD-10-CM | POA: Insufficient documentation

## 2012-06-18 DIAGNOSIS — K219 Gastro-esophageal reflux disease without esophagitis: Secondary | ICD-10-CM | POA: Insufficient documentation

## 2012-06-18 DIAGNOSIS — E7800 Pure hypercholesterolemia, unspecified: Secondary | ICD-10-CM | POA: Insufficient documentation

## 2012-06-18 NOTE — Assessment & Plan Note (Signed)
Low cholesterol diet and exercise.  Follow lipid panel.   

## 2012-06-18 NOTE — Assessment & Plan Note (Signed)
Has tried fosamax and boniva - caused bone pain.  Bone density 05/14/10 revealed osteoporosis with some decrease from previous.  Have discussed other treatment options.  Continue calcium and vitamin D.

## 2012-06-18 NOTE — Assessment & Plan Note (Signed)
On prilosec.  Symptoms controlled.   

## 2012-06-18 NOTE — Patient Instructions (Addendum)
It was nice seeing you today.  I am glad you have been doing well.  Let me know if you need anything. 

## 2012-06-18 NOTE — Progress Notes (Signed)
Subjective:    Patient ID: Alexis Reed, female    DOB: [birth year], 66 y.o.   MRN: [id number]  HPI 66 year old female with past history of hypercholesterolemia who comes in today for her complete physical exam.  States she is doing well.  Has had no further headaches.  She was having problems with pain in her left hip back in 8/13.  Saw Dr Gavin Potters.  Diagnosed with Bursitis.  S/p injection.  Helped.  Had a flare last week.  Saw Dr Gavin Potters again.  Had another injection.  Doing better.  Had xray of her back that revealed degenerative disc.   Overall stable now.  No significant pain.  Eating and drinking well.  No cardiac symptoms with increased activity or exertion.   Past Medical History  Diagnosis Date  . Hypercholesterolemia   . Arthritis   . Fibroids     s/p hysterectomy  . Benign breast lumps     multiple lumps biopsy and remove x's 5 last being 1979 by Dr. Okey Dupre  . Osteoporosis     Current Outpatient Prescriptions on File Prior to Visit  Medication Sig Dispense Refill  . Calcium Carbonate-Vitamin D (CALCIUM 600-D) 600-400 MG-UNIT per tablet Take 1 tablet by mouth 2 (two) times daily.      Marland Kitchen omeprazole (PRILOSEC) 20 MG capsule Take 20 mg by mouth daily.        Review of Systems Patient denies any headache, lightheadedness or dizziness.  No sinus or allergy symptoms.  No chest pain, tightness or palpitations.  No increased shortness of breath, cough or congestion.  No nausea or vomiting.  No acid reflux, dysphagia or odynophagia.  Prilosec controls her symptoms.  No abdominal pain or cramping.  No bowel change, such as diarrhea, constipation, BRBPR or melana.  No urine change.        Objective:   Physical Exam Filed Vitals:   06/18/12 1428  BP: 108/72  Pulse: 70  Temp: 97.6 F (73.35 C)   66 year old female in no acute distress.   HEENT:  Nares- clear.  Oropharynx - without lesions. NECK:  Supple.  Nontender.  No audible bruit.  HEART:  Appears to be regular. LUNGS:   No crackles or wheezing audible.  Respirations even and unlabored.  RADIAL PULSE:  Equal bilaterally.    BREASTS:  No nipple discharge or nipple retraction present.  Could not appreciate any distinct nodules or axillary adenopathy.  ABDOMEN:  Soft, nontender.  Bowel sounds present and normal.  No audible abdominal bruit.  GU:  Normal external genitalia.  Vaginal vault without lesions.  S/P hysterectomy.   Could not appreciate any adnexal masses or tenderness.   RECTAL:  Heme negative.   EXTREMITIES:  No increased edema present.  DP pulses palpable and equal bilaterally.           Assessment & Plan:  THYROID FULLNESS.  Has been worked up.  Follow TSH.   GI.  Colonoscopy 09/22/08 revealed internal hemorrhoids.  Recommended repeat in five years.  Upper symptoms controlled on Omeprazole.  IFOB given.    PREVIOUS ABNORMAL BREAST EXAM.  Mammogram 09/12/11 - BiRADS II.  Schedule follow up mammogram when due.    CARDIOVASCULAR.  Stress test 03/08/10 negative for ischemia.  Currently asymptomatic.    ;PULMONARY.  PFTs revealed mild obstruction and moderately decreased diffusion capacity.  Findings c/w COPD.  CT chest 04/15/10 negative.  Breathing stable.  Continue risk factor modification.   HEALTH MAINTENANCE.  Physical today.  Mammogram 09/12/11 BiRADS II.  Colonoscopy as outlined.

## 2012-06-19 ENCOUNTER — Encounter: Payer: Self-pay | Admitting: Internal Medicine

## 2012-06-25 ENCOUNTER — Other Ambulatory Visit (INDEPENDENT_AMBULATORY_CARE_PROVIDER_SITE_OTHER): Payer: No Typology Code available for payment source

## 2012-06-25 DIAGNOSIS — R5381 Other malaise: Secondary | ICD-10-CM

## 2012-06-25 DIAGNOSIS — E0789 Other specified disorders of thyroid: Secondary | ICD-10-CM

## 2012-06-25 DIAGNOSIS — R5383 Other fatigue: Secondary | ICD-10-CM

## 2012-06-25 DIAGNOSIS — Z139 Encounter for screening, unspecified: Secondary | ICD-10-CM

## 2012-06-25 DIAGNOSIS — E7800 Pure hypercholesterolemia, unspecified: Secondary | ICD-10-CM

## 2012-06-25 LAB — COMPREHENSIVE METABOLIC PANEL
ALT: 17 U/L (ref 0–35)
AST: 16 U/L (ref 0–37)
Albumin: 4.2 g/dL (ref 3.5–5.2)
Alkaline Phosphatase: 79 U/L (ref 39–117)
BUN: 16 mg/dL (ref 6–23)
CO2: 27 mEq/L (ref 19–32)
Calcium: 9.4 mg/dL (ref 8.4–10.5)
Chloride: 103 mEq/L (ref 96–112)
Creatinine, Ser: 0.7 mg/dL (ref 0.4–1.2)
GFR: 97.44 mL/min (ref >60.00)
Glucose, Bld: 95 mg/dL (ref 70–99)
Potassium: 4.2 mEq/L (ref 3.5–5.1)
Sodium: 137 mEq/L (ref 135–145)
Total Bilirubin: 0.8 mg/dL (ref 0.3–1.2)
Total Protein: 7.5 g/dL (ref 6.0–8.3)

## 2012-06-25 LAB — LIPID PANEL
Cholesterol: 184 mg/dL (ref 0–200)
HDL: 41.9 mg/dL (ref >39.00)
LDL Cholesterol: 126 mg/dL — ABNORMAL HIGH (ref 0–99)
Total CHOL/HDL Ratio: 4
Triglycerides: 82 mg/dL (ref 0.0–149.0)
VLDL: 16.4 mg/dL (ref 0.0–40.0)

## 2012-06-25 LAB — CBC WITH DIFFERENTIAL/PLATELET
Basophils Absolute: 0 10*3/uL (ref 0.0–0.1)
Basophils Relative: 0.6 % (ref 0.0–3.0)
Eosinophils Absolute: 0.2 10*3/uL (ref 0.0–0.7)
Eosinophils Relative: 2.5 % (ref 0.0–5.0)
HCT: 41.7 % (ref 36.0–46.0)
Hemoglobin: 14.2 g/dL (ref 12.0–15.0)
Lymphocytes Relative: 24.1 % (ref 12.0–46.0)
Lymphs Abs: 1.7 10*3/uL (ref 0.7–4.0)
MCHC: 34 g/dL (ref 30.0–36.0)
MCV: 89.8 fl (ref 78.0–100.0)
Monocytes Absolute: 0.5 10*3/uL (ref 0.1–1.0)
Monocytes Relative: 6.9 % (ref 3.0–12.0)
Neutro Abs: 4.6 10*3/uL (ref 1.4–7.7)
Neutrophils Relative %: 65.9 % (ref 43.0–77.0)
Platelets: 323 10*3/uL (ref 150.0–400.0)
RBC: 4.65 Mil/uL (ref 3.87–5.11)
RDW: 13.1 % (ref 11.5–14.6)
WBC: 6.9 10*3/uL (ref 4.5–10.5)

## 2012-06-25 LAB — TSH: TSH: 1.48 u[IU]/mL (ref 0.35–5.50)

## 2012-06-25 LAB — FECAL OCCULT BLOOD, IMMUNOCHEMICAL: Fecal Occult Bld: NEGATIVE

## 2012-06-30 ENCOUNTER — Encounter: Payer: Self-pay | Admitting: *Deleted

## 2012-08-17 ENCOUNTER — Other Ambulatory Visit: Payer: Self-pay | Admitting: Internal Medicine

## 2012-08-17 NOTE — Telephone Encounter (Signed)
Meloxicam 7.5 mg take one once a day as needed - refill needed.  [facility] in [place].

## 2012-08-18 NOTE — Telephone Encounter (Signed)
Please confirm with pharmacy that I prescribed and when last filled.

## 2012-08-18 NOTE — Telephone Encounter (Signed)
Just checking to see if you prescribed this medication. It was not in the system.

## 2012-08-23 ENCOUNTER — Other Ambulatory Visit: Payer: Self-pay | Admitting: *Deleted

## 2012-08-23 NOTE — Telephone Encounter (Signed)
How many would you like to dispense for Meloxicam and refills ?

## 2012-08-24 MED ORDER — MELOXICAM 7.5 MG PO TABS: 7.5000 mg | ORAL_TABLET | Freq: Every day | ORAL | Status: DC | PRN

## 2012-08-24 NOTE — Telephone Encounter (Signed)
meloxicam 7.5mg  q day prn #30  No refills.  Called in to pharmacy

## 2012-09-13 ENCOUNTER — Ambulatory Visit: Payer: Self-pay | Admitting: Internal Medicine

## 2012-09-13 IMAGING — MG MM CAD SCREENING MAMMO
1 series · 4 of 4 positions shown · non-contrast
Comparison: none

REASON FOR EXAM: SCR MAMMO NO ORDER
COMMENTS:

[R CC · right · 4 of 4 slices shown]
[im 1/4]
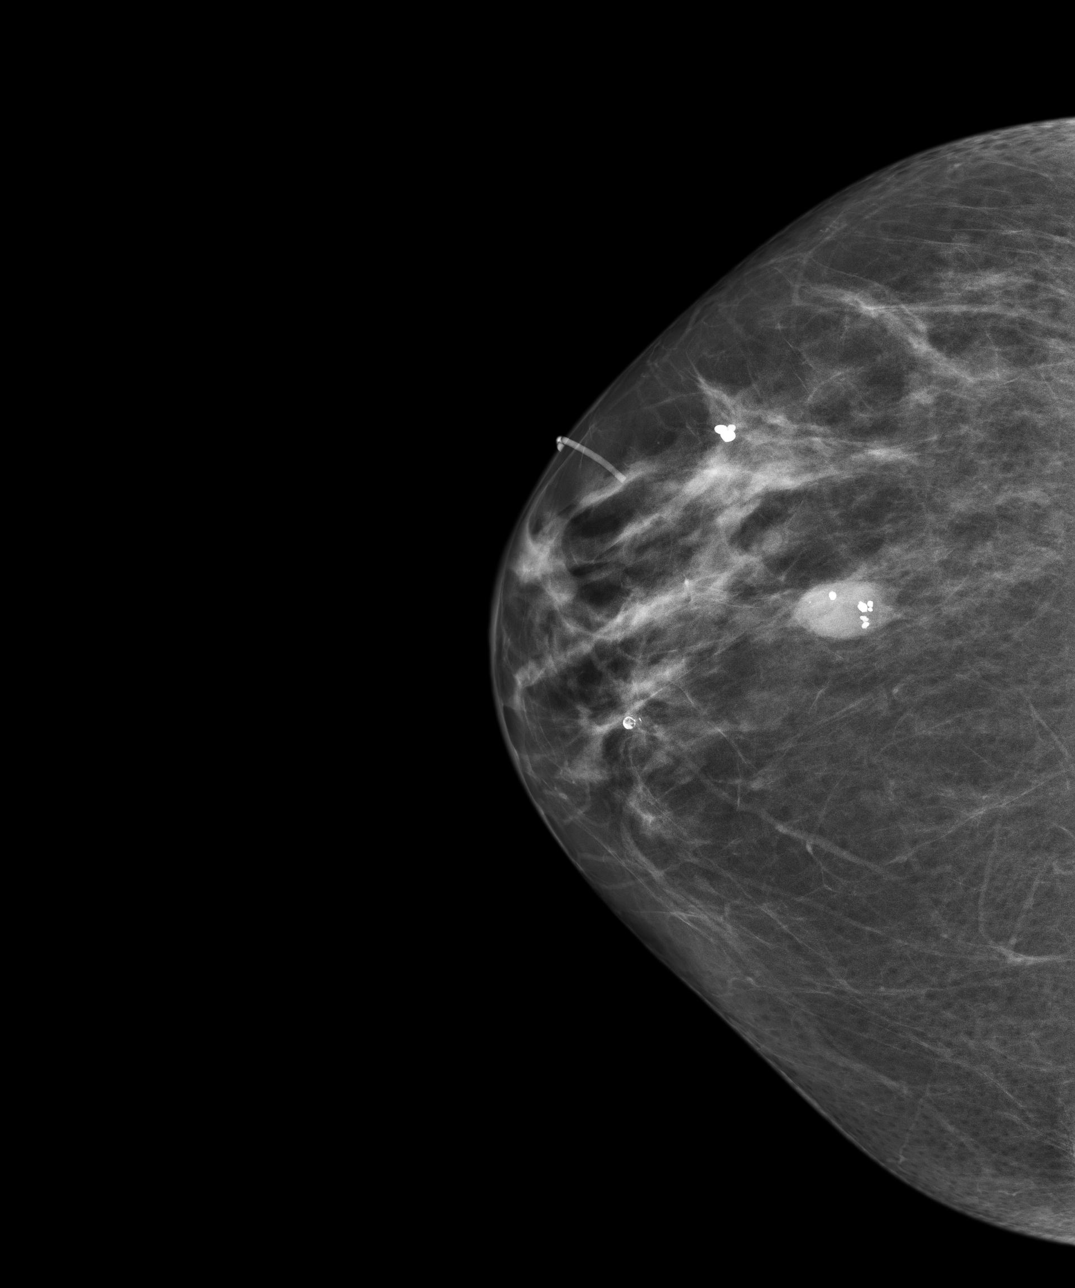
[im 2/4]
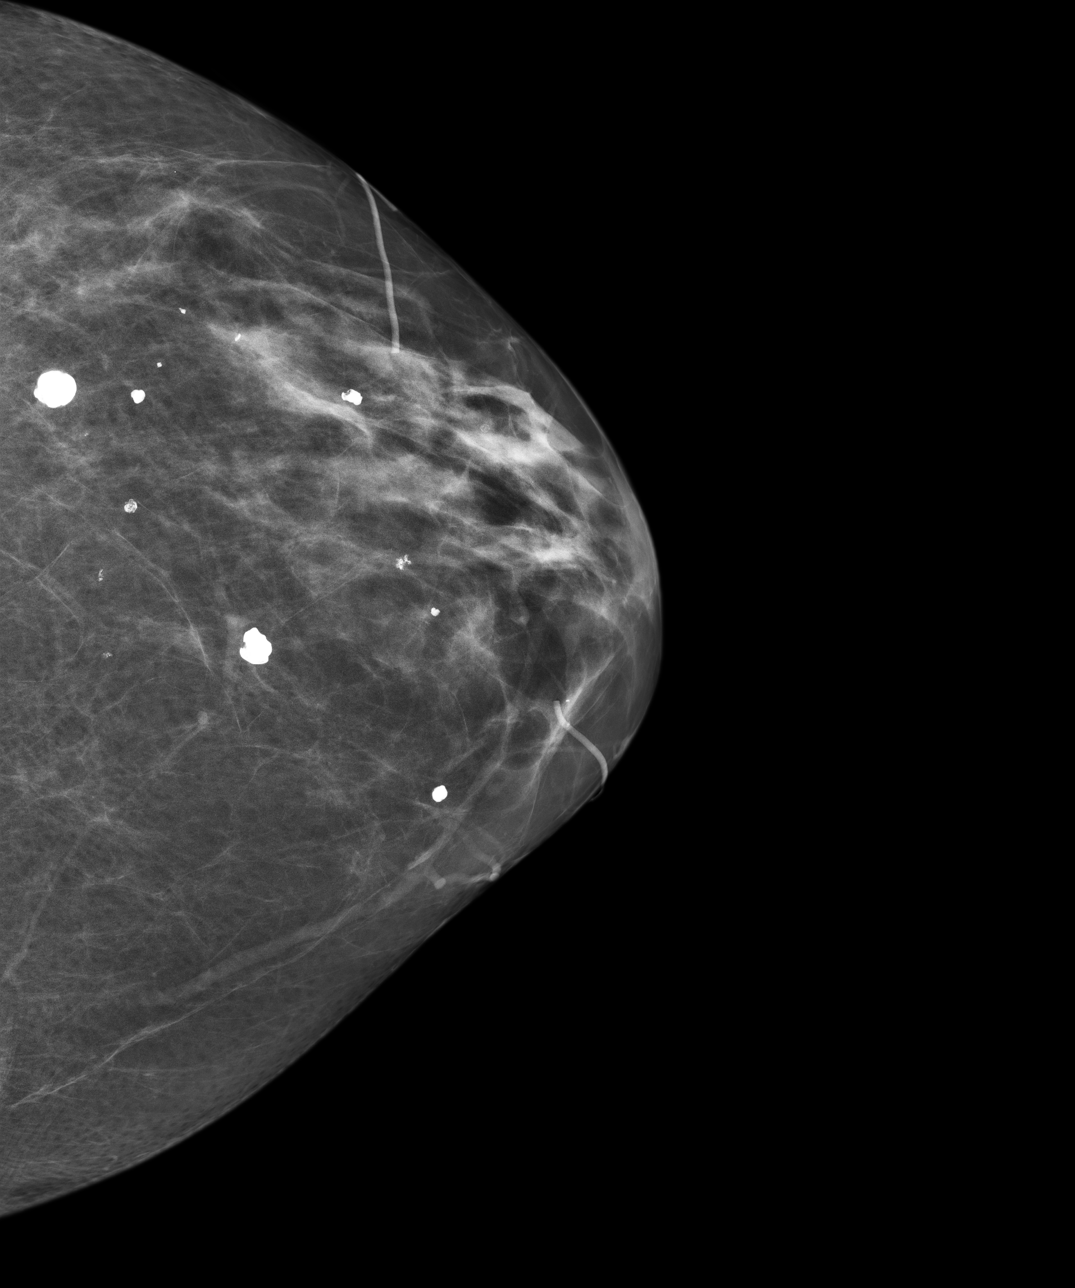
[im 3/4]
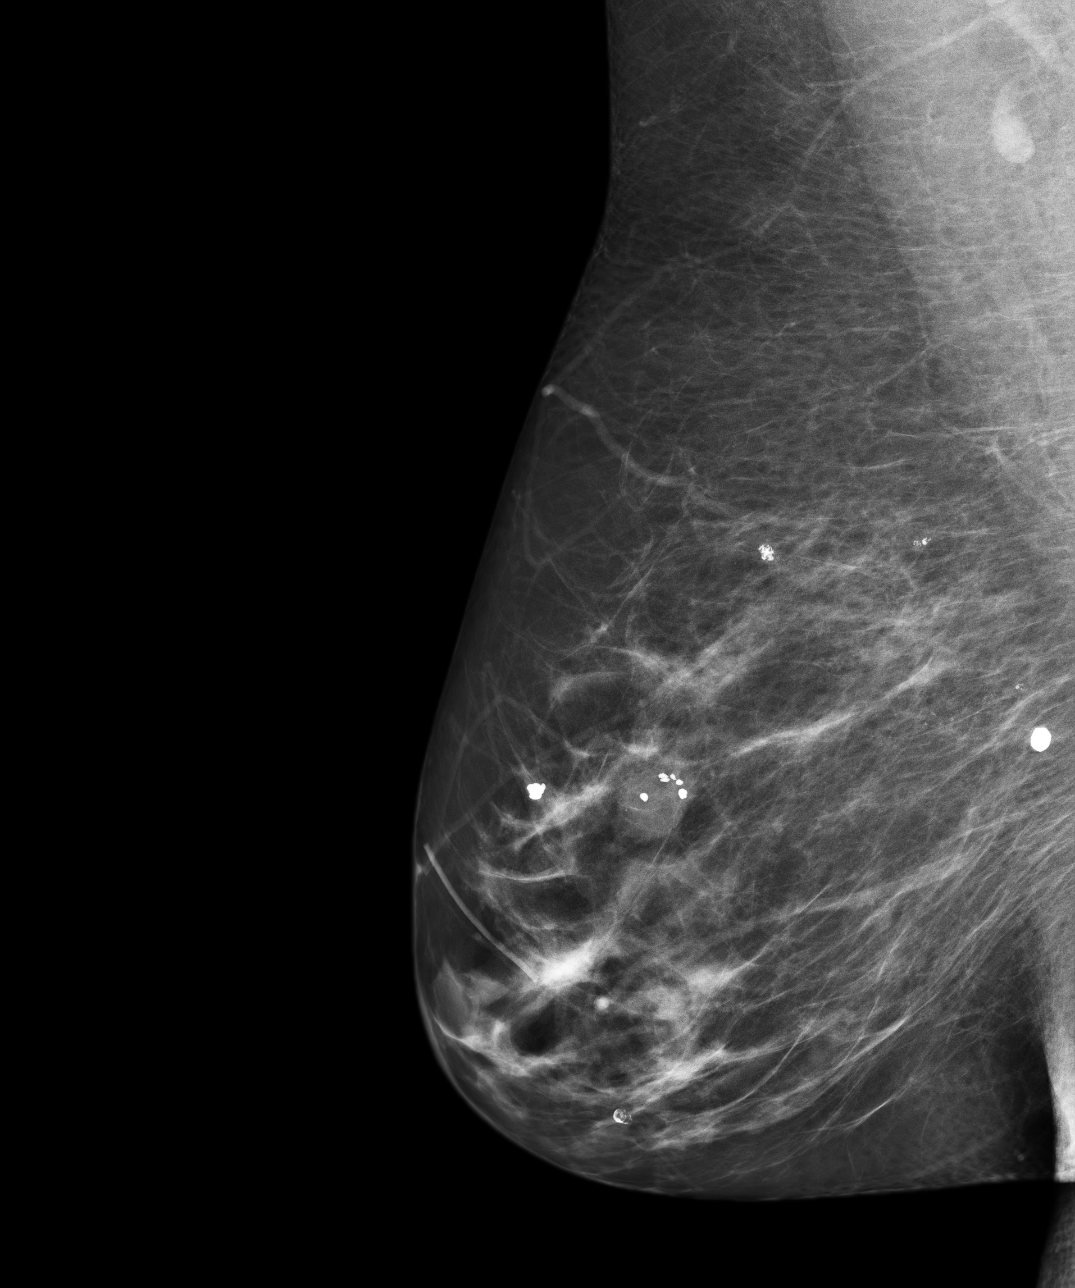
[im 4/4]
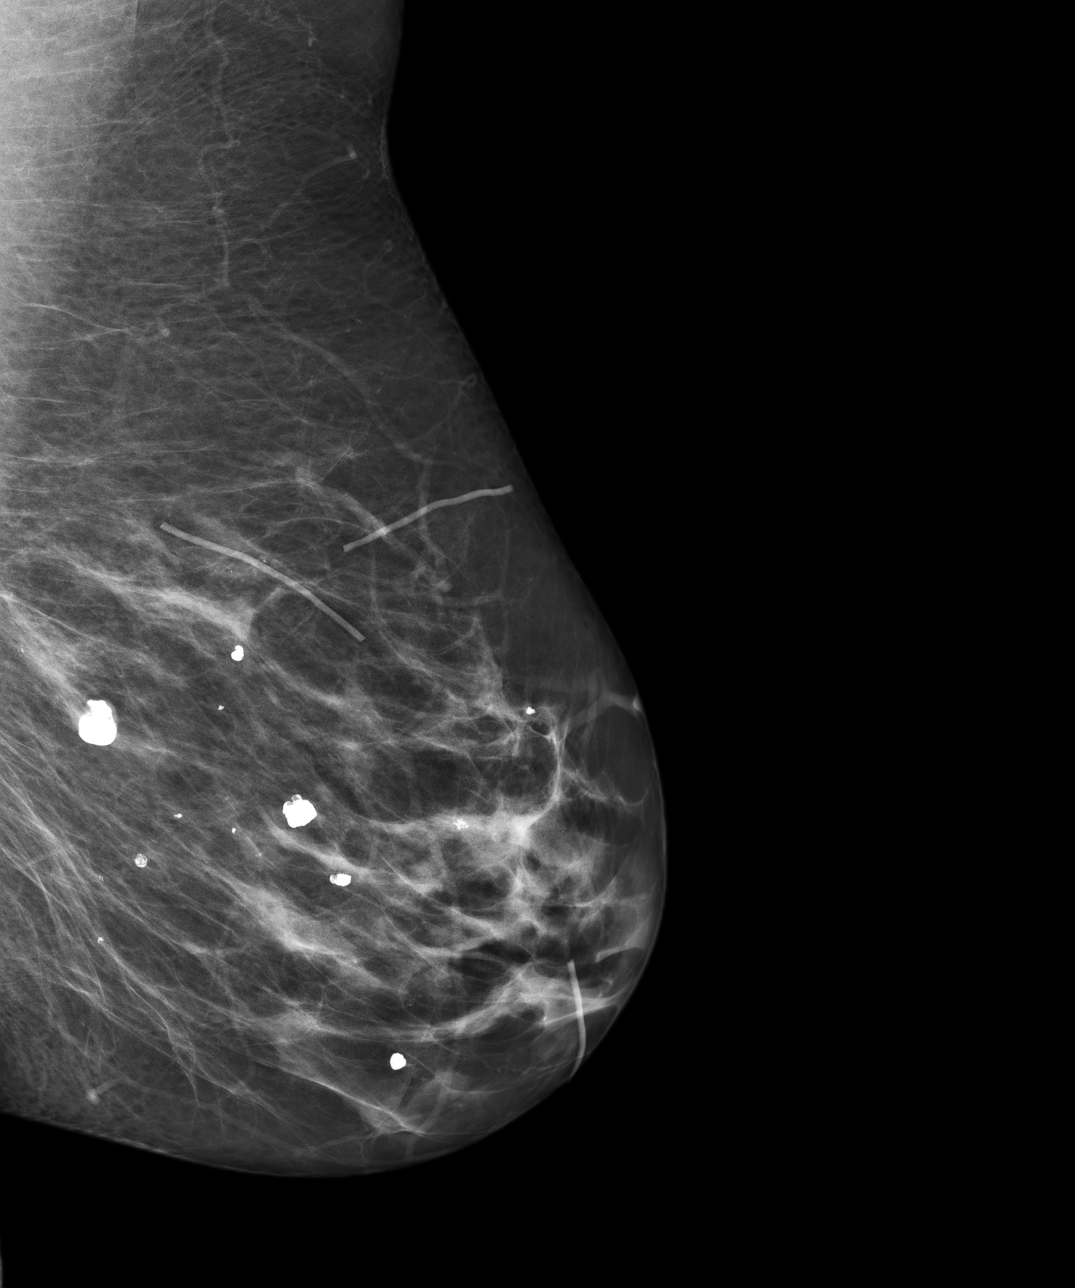

[4 of 4 positions shown; findings below may reference images not displayed]

PROCEDURE:     MAM - MAM DGTL SCRN MAM NO ORDER W/CAD  - [DATE] [DATE]

RESULT:     There is no family history of breast cancer. There is reported
previous negative left lumpectomy in the left breast and right breast within
negative cyst aspiration in the left breast many years ago. There is no
history of breast cancer in the patient's family. Comparison is made to
previous digital studies performed on [DATE] [DATE] [DATE], [DATE] [DATE] [DATE] and [DATE]. Numerous stable microcalcifications are present. Scar markers
are present over the anterior lateral right breast above the nipple and in
the anterior medial left breast inferior to the nipple as well as in the
lateral mid upper outer portion of the left breast. There is no dominant
mass, malignant calcification or architectural distortion. The mammographic
appearance is stable.
IMPRESSION: Stable, benign appearing bilateral mammogram. Please
continue to encourage annual mammographic followup and monthly breast self
exam. BREAST COMPOSITION: The breast composition is SCATTERED FIBROGLANDULAR
TISSUE (glandular tissue is 25-50%) BI-RADS: Category 2- Benign Finding

A NEGATIVE MAMMOGRAM REPORT DOES NOT PRECLUDE BIOPSY OR OTHER EVALUATION OF
A CLINICALLY PALPABLE OR OTHERWISE SUSPICIOUS MASS OR LESION. BREAST CANCER
MAY NOT BE DETECTED IN UP TO 10% OF CASES.

[REDACTED]

## 2012-09-15 ENCOUNTER — Encounter: Payer: Self-pay | Admitting: Internal Medicine

## 2012-10-01 ENCOUNTER — Encounter: Payer: Self-pay | Admitting: Internal Medicine

## 2012-10-07 ENCOUNTER — Other Ambulatory Visit: Payer: Self-pay | Admitting: *Deleted

## 2012-10-08 ENCOUNTER — Other Ambulatory Visit: Payer: Self-pay | Admitting: *Deleted

## 2012-10-08 MED ORDER — MELOXICAM 7.5 MG PO TABS: 7.5000 mg | ORAL_TABLET | Freq: Every day | ORAL | Status: DC | PRN

## 2012-12-15 ENCOUNTER — Encounter: Payer: Self-pay | Admitting: Internal Medicine

## 2012-12-15 ENCOUNTER — Ambulatory Visit (INDEPENDENT_AMBULATORY_CARE_PROVIDER_SITE_OTHER): Payer: No Typology Code available for payment source | Admitting: Internal Medicine

## 2012-12-15 VITALS — BP 102/80 | HR 70 | Temp 97.7°F | Ht 63.5 in | Wt 182.5 lb

## 2012-12-15 DIAGNOSIS — Z79899 Other long term (current) drug therapy: Secondary | ICD-10-CM

## 2012-12-15 DIAGNOSIS — K219 Gastro-esophageal reflux disease without esophagitis: Secondary | ICD-10-CM

## 2012-12-15 DIAGNOSIS — E7800 Pure hypercholesterolemia, unspecified: Secondary | ICD-10-CM

## 2012-12-15 DIAGNOSIS — M810 Age-related osteoporosis without current pathological fracture: Secondary | ICD-10-CM

## 2012-12-15 LAB — CREATININE, SERUM: Creatinine, Ser: 0.5 mg/dL (ref 0.4–1.2)

## 2012-12-16 ENCOUNTER — Encounter: Payer: Self-pay | Admitting: Internal Medicine

## 2012-12-19 ENCOUNTER — Encounter: Payer: Self-pay | Admitting: Internal Medicine

## 2012-12-19 NOTE — Assessment & Plan Note (Signed)
Low cholesterol diet and exercise.  Follow lipid panel.  Lipid panel 06/25/12 revealed total cholesterol 184, triglycerides 82, HDL 42 and LDL 126.

## 2012-12-19 NOTE — Progress Notes (Signed)
Subjective:    Patient ID: Alexis Reed, female    DOB: 11-24-1945, 67 y.o.   MRN: [id number]  HPI 67 year old female with past history of hypercholesterolemia who comes in today for a scheduled follow up.  States she is doing well.  Has had no further headaches.  She was having problems with pain in her left hip back in 8/13.  Saw Dr Gavin Potters.  Diagnosed with Bursitis.  S/p injection.  Helped.  Had xray of her back that revealed degenerative disc.   She notices some pain intermittently in her right lower back.  Takes mobic.  Helps.  Tolerating.  No acid reflux or stomach irritations.   Eating and drinking well.  No cardiac symptoms with increased activity or exertion.    Past Medical History  Diagnosis Date  . Hypercholesterolemia   . Arthritis   . Fibroids     s/p hysterectomy  . Benign breast lumps     multiple lumps biopsy and remove x's 5 last being 1979 by Dr. Okey Dupre  . Osteoporosis     Current Outpatient Prescriptions on File Prior to Visit  Medication Sig Dispense Refill  . b complex vitamins tablet Take 1 tablet by mouth daily.      . Calcium Carbonate-Vitamin D (CALCIUM 600-D) 600-400 MG-UNIT per tablet Take 1 tablet by mouth 2 (two) times daily.      . Cholecalciferol (VITAMIN D) 2000 UNITS tablet Take 2,000 Units by mouth daily.      . fish oil-omega-3 fatty acids 1000 MG capsule Take 1 g by mouth daily.      . meloxicam (MOBIC) 7.5 MG tablet Take 1 tablet (7.5 mg total) by mouth daily as needed.  30 tablet  0  . omeprazole (PRILOSEC) 20 MG capsule Take 20 mg by mouth daily.       No current facility-administered medications on file prior to visit.    Review of Systems Patient denies any headache, lightheadedness or dizziness.  No sinus or allergy symptoms.  No chest pain, tightness or palpitations.  No increased shortness of breath, cough or congestion.  No nausea or vomiting.  No acid reflux, dysphagia or odynophagia.  Prilosec controls her symptoms.  No abdominal  pain or cramping.  No bowel change, such as diarrhea, constipation, BRBPR or melana.  No urine change.  Intermittent back and hip pain as outlined.  Overall she feels she is doing well.       Objective:   Physical Exam  Filed Vitals:   12/15/12 1055  BP: 102/80  Pulse: 70  Temp: 97.7 F (31.45 C)   67 year old female in no acute distress.   HEENT:  Nares- clear.  Oropharynx - without lesions. NECK:  Supple.  Nontender.  No audible bruit.  HEART:  Appears to be regular. LUNGS:  No crackles or wheezing audible.  Respirations even and unlabored.  RADIAL PULSE:  Equal bilaterally.   ABDOMEN:  Soft, nontender.  Bowel sounds present and normal.  No audible abdominal bruit.   EXTREMITIES:  No increased edema present.  DP pulses palpable and equal bilaterally.           Assessment & Plan:  MSK.  Back and hip pain as outlined.  Takes mobic and it helps.  Has seen Dr Gavin Potters.  S/p injection. Helped.  Follow. Check Cr (being on Meloxicam).    THYROID FULLNESS.  Has been worked up.  Follow TSH.   GI.  Colonoscopy 09/22/08 revealed internal hemorrhoids.  Recommended repeat in five years.  Upper symptoms controlled on Omeprazole.    PREVIOUS ABNORMAL BREAST EXAM.  Mammogram 09/13/12 - BiRADS II.     CARDIOVASCULAR.  Stress test 03/08/10 negative for ischemia.  Currently asymptomatic.    ;PULMONARY.  PFTs revealed mild obstruction and moderately decreased diffusion capacity.  Findings c/w COPD.  CT chest 04/15/10 negative.  Breathing stable.  Continue risk factor modification.   HEALTH MAINTENANCE.  Physical 06/18/12.  Mammogram 09/13/12- BiRADS II.  Colonoscopy as outlined.

## 2012-12-19 NOTE — Assessment & Plan Note (Signed)
On prilosec.  Symptoms controlled.   

## 2012-12-19 NOTE — Assessment & Plan Note (Signed)
Has tried fosamax and boniva - caused bone pain.  Bone density 05/14/10 revealed osteoporosis with some decrease from previous.  Have discussed other treatment options.  Continue calcium and vitamin D.

## 2012-12-20 ENCOUNTER — Other Ambulatory Visit: Payer: Self-pay | Admitting: *Deleted

## 2012-12-20 MED ORDER — MELOXICAM 7.5 MG PO TABS: 7.5000 mg | ORAL_TABLET | Freq: Every day | ORAL | Status: DC | PRN

## 2012-12-20 NOTE — Telephone Encounter (Signed)
My Chart Message: Your kidney function is within normal limits  Notified patient of recent labs ^^^^^^^^^^

## 2012-12-20 NOTE — Telephone Encounter (Signed)
rx sent in for mobic #30 with 2 refills.

## 2012-12-20 NOTE — Telephone Encounter (Signed)
Okay to refill? 

## 2013-03-11 ENCOUNTER — Ambulatory Visit (INDEPENDENT_AMBULATORY_CARE_PROVIDER_SITE_OTHER): Payer: Medicare Other | Admitting: Internal Medicine

## 2013-03-11 ENCOUNTER — Encounter: Payer: Self-pay | Admitting: Internal Medicine

## 2013-03-11 VITALS — BP 110/70 | HR 68 | Temp 97.9°F | Ht 63.5 in | Wt 182.8 lb

## 2013-03-11 DIAGNOSIS — K6289 Other specified diseases of anus and rectum: Secondary | ICD-10-CM

## 2013-03-11 MED ORDER — HYDROCORTISONE ACE-PRAMOXINE 1-1 % RE CREA: TOPICAL_CREAM | Freq: Two times a day (BID) | RECTAL | Status: DC

## 2013-03-14 ENCOUNTER — Encounter: Payer: Self-pay | Admitting: Internal Medicine

## 2013-03-14 NOTE — Progress Notes (Signed)
  Subjective:    Patient ID: Alexis Reed, female    DOB: 1945-11-25, 67 y.o.   MRN: [id number]  HPI 67 year old female with past history of hypercholesterolemia who comes in today as a work in with concerns regarding possible hemorrhoid.  Has had intermittent problems with hemorrhoids for the last few months.  Describes perirectal irritation.  Is having normal bowel movements.  No straining.  Increased irritation with walking - when flared.  Intermittently flares.  Has tried preparation H.  Occasionally notices some bright red blood per rectum with wiping (streaks on the tissue).  No abdominal pain or cramping.      Past Medical History  Diagnosis Date  . Hypercholesterolemia   . Arthritis   . Fibroids     s/p hysterectomy  . Benign breast lumps     multiple lumps biopsy and remove x's 5 last being 1979 by Dr. Okey Dupre  . Osteoporosis     Current Outpatient Prescriptions on File Prior to Visit  Medication Sig Dispense Refill  . Calcium Carbonate-Vitamin D (CALCIUM 600-D) 600-400 MG-UNIT per tablet Take 1 tablet by mouth 2 (two) times daily.      . Cholecalciferol (VITAMIN D) 2000 UNITS tablet Take 2,000 Units by mouth daily.      . fish oil-omega-3 fatty acids 1000 MG capsule Take 1 g by mouth daily.      . meloxicam (MOBIC) 7.5 MG tablet Take 1 tablet (7.5 mg total) by mouth daily as needed.  30 tablet  2  . omeprazole (PRILOSEC) 20 MG capsule Take 20 mg by mouth daily.       No current facility-administered medications on file prior to visit.    Review of Systems Patient denies any headache, lightheadedness or dizziness.  No chest pain, tightness or palpitations.  No increased shortness of breath, cough or congestion.  No nausea or vomiting.  No acid reflux, dysphagia or odynophagia.  Prilosec controls her symptoms.  No abdominal pain or cramping.  No bowel significant bowel change.  Rectal irritation as outlined.  No pain with bm.       Objective:   Physical Exam  Filed  Vitals:   03/11/13 1112  BP: 110/70  Pulse: 68  Temp: 97.9 F (69.10 C)   67 year old female in no acute distress.   HEART:  Appears to be regular. LUNGS:  No crackles or wheezing audible.  Respirations even and unlabored. ABDOMEN:  Soft, nontender.  Bowel sounds present and normal.  No audible abdominal bruit.   RECTAL:  Skin tag present.  Minimal perirectal irritation.  Heme negative on exam.             Assessment & Plan:  RECTAL IRRITATION.  Analpram cream as directed.  Follow.  Notify me if persistent problems.     THYROID FULLNESS.  Has been worked up.  Follow TSH.   GI.  Colonoscopy 09/22/08 revealed internal hemorrhoids.  Recommended repeat in five years.  Upper symptoms controlled on Omeprazole.    PREVIOUS ABNORMAL BREAST EXAM.  Mammogram 09/13/12 - BiRADS II.     CARDIOVASCULAR.  Stress test 03/08/10 negative for ischemia.  Currently asymptomatic.    ;PULMONARY.  PFTs revealed mild obstruction and moderately decreased diffusion capacity.  Findings c/w COPD.  CT chest 04/15/10 negative.  Breathing stable.  Continue risk factor modification.   HEALTH MAINTENANCE.  Physical 06/18/12.  Mammogram 09/13/12- BiRADS II.  Colonoscopy as outlined.

## 2013-05-03 ENCOUNTER — Other Ambulatory Visit: Payer: Self-pay | Admitting: *Deleted

## 2013-05-03 ENCOUNTER — Telehealth: Payer: Self-pay | Admitting: Internal Medicine

## 2013-05-03 MED ORDER — OMEPRAZOLE 20 MG PO CPDR: 20.0000 mg | DELAYED_RELEASE_CAPSULE | Freq: Every day | ORAL | Status: DC

## 2013-05-03 NOTE — Telephone Encounter (Signed)
Done

## 2013-05-03 NOTE — Telephone Encounter (Signed)
Pt is needing refill on Omprazole pt uses [facility].

## 2013-05-08 ENCOUNTER — Encounter: Payer: Self-pay | Admitting: Internal Medicine

## 2013-05-09 ENCOUNTER — Encounter: Payer: Self-pay | Admitting: Internal Medicine

## 2013-05-11 NOTE — Telephone Encounter (Signed)
Spoke with pharmacist, did not have refill on file. Gave verbal, pt notified.

## 2013-05-19 ENCOUNTER — Other Ambulatory Visit: Payer: Self-pay

## 2013-06-20 ENCOUNTER — Other Ambulatory Visit (INDEPENDENT_AMBULATORY_CARE_PROVIDER_SITE_OTHER): Payer: Medicare Other

## 2013-06-20 DIAGNOSIS — K219 Gastro-esophageal reflux disease without esophagitis: Secondary | ICD-10-CM

## 2013-06-20 DIAGNOSIS — E7800 Pure hypercholesterolemia, unspecified: Secondary | ICD-10-CM

## 2013-06-20 DIAGNOSIS — M810 Age-related osteoporosis without current pathological fracture: Secondary | ICD-10-CM

## 2013-06-20 DIAGNOSIS — Z79899 Other long term (current) drug therapy: Secondary | ICD-10-CM

## 2013-06-20 LAB — COMPREHENSIVE METABOLIC PANEL
ALT: 13 U/L (ref 0–35)
AST: 16 U/L (ref 0–37)
Albumin: 4.1 g/dL (ref 3.5–5.2)
Alkaline Phosphatase: 87 U/L (ref 39–117)
BUN: 12 mg/dL (ref 6–23)
CO2: 29 mEq/L (ref 19–32)
Calcium: 9.3 mg/dL (ref 8.4–10.5)
Chloride: 105 mEq/L (ref 96–112)
Creatinine, Ser: 0.6 mg/dL (ref 0.4–1.2)
GFR: 102.58 mL/min (ref >60.00)
Glucose, Bld: 94 mg/dL (ref 70–99)
Potassium: 4.7 mEq/L (ref 3.5–5.1)
Sodium: 141 mEq/L (ref 135–145)
Total Bilirubin: 0.5 mg/dL (ref 0.3–1.2)
Total Protein: 7.3 g/dL (ref 6.0–8.3)

## 2013-06-20 LAB — LIPID PANEL
Cholesterol: 222 mg/dL — ABNORMAL HIGH (ref 0–200)
HDL: 48.1 mg/dL (ref >39.00)
Total CHOL/HDL Ratio: 5
Triglycerides: 92 mg/dL (ref 0.0–149.0)
VLDL: 18.4 mg/dL (ref 0.0–40.0)

## 2013-06-20 LAB — CBC WITH DIFFERENTIAL/PLATELET
Basophils Absolute: 0 10*3/uL (ref 0.0–0.1)
Basophils Relative: 0.5 % (ref 0.0–3.0)
Eosinophils Absolute: 0.1 10*3/uL (ref 0.0–0.7)
Eosinophils Relative: 2.4 % (ref 0.0–5.0)
HCT: 42.4 % (ref 36.0–46.0)
Hemoglobin: 14.1 g/dL (ref 12.0–15.0)
Lymphocytes Relative: 26 % (ref 12.0–46.0)
Lymphs Abs: 1.4 10*3/uL (ref 0.7–4.0)
MCHC: 33.2 g/dL (ref 30.0–36.0)
MCV: 90.5 fl (ref 78.0–100.0)
Monocytes Absolute: 0.4 10*3/uL (ref 0.1–1.0)
Monocytes Relative: 7.9 % (ref 3.0–12.0)
Neutro Abs: 3.4 10*3/uL (ref 1.4–7.7)
Neutrophils Relative %: 63.2 % (ref 43.0–77.0)
Platelets: 283 10*3/uL (ref 150.0–400.0)
RBC: 4.68 Mil/uL (ref 3.87–5.11)
RDW: 13.4 % (ref 11.5–14.6)
WBC: 5.4 10*3/uL (ref 4.5–10.5)

## 2013-06-20 LAB — TSH: TSH: 1.01 u[IU]/mL (ref 0.35–5.50)

## 2013-06-20 LAB — LDL CHOLESTEROL, DIRECT: Direct LDL: 164.5 mg/dL

## 2013-06-21 ENCOUNTER — Encounter: Payer: Self-pay | Admitting: Internal Medicine

## 2013-06-24 ENCOUNTER — Ambulatory Visit (INDEPENDENT_AMBULATORY_CARE_PROVIDER_SITE_OTHER): Payer: Medicare Other | Admitting: Internal Medicine

## 2013-06-24 ENCOUNTER — Encounter: Payer: Self-pay | Admitting: *Deleted

## 2013-06-24 ENCOUNTER — Encounter: Payer: Self-pay | Admitting: Internal Medicine

## 2013-06-24 VITALS — BP 120/80 | HR 70 | Temp 98.3°F | Ht 63.75 in | Wt 189.2 lb

## 2013-06-24 DIAGNOSIS — K644 Residual hemorrhoidal skin tags: Secondary | ICD-10-CM

## 2013-06-24 DIAGNOSIS — M810 Age-related osteoporosis without current pathological fracture: Secondary | ICD-10-CM

## 2013-06-24 DIAGNOSIS — E7800 Pure hypercholesterolemia, unspecified: Secondary | ICD-10-CM

## 2013-06-24 DIAGNOSIS — K219 Gastro-esophageal reflux disease without esophagitis: Secondary | ICD-10-CM

## 2013-06-24 DIAGNOSIS — Z1239 Encounter for other screening for malignant neoplasm of breast: Secondary | ICD-10-CM

## 2013-06-24 NOTE — Progress Notes (Signed)
Subjective:    Patient ID: Alexis Reed, female    DOB: 24-Aug-1945, 67 y.o.   MRN: [id number]  HPI 67 year old female with past history of hypercholesterolemia who comes in today to follow up on these issues as well as for a complete physical exam.  States she is doing well.  Stays active.  No cardiac symptoms with increased activity or exertion.  Breathing stable.  Eating and drinking well.  Bowels stable.  She does report some increased/persistent rectal irritation.  Persistent "tag".  Bothers her.  She request referral to surgery for evaluation.       Past Medical History  Diagnosis Date  . Hypercholesterolemia   . Arthritis   . Fibroids     s/p hysterectomy  . Benign breast lumps     multiple lumps biopsy and remove x's 5 last being 1979 by Dr. Okey Dupre  . Osteoporosis     Current Outpatient Prescriptions on File Prior to Visit  Medication Sig Dispense Refill  . Calcium Carbonate-Vitamin D (CALCIUM 600-D) 600-400 MG-UNIT per tablet Take 1 tablet by mouth 2 (two) times daily.      . Cholecalciferol (VITAMIN D) 2000 UNITS tablet Take 2,000 Units by mouth daily.      . fish oil-omega-3 fatty acids 1000 MG capsule Take 1 g by mouth daily.      . meloxicam (MOBIC) 7.5 MG tablet Take 1 tablet (7.5 mg total) by mouth daily as needed.  30 tablet  2  . omeprazole (PRILOSEC) 20 MG capsule Take 1 capsule (20 mg total) by mouth daily.  30 capsule  5   No current facility-administered medications on file prior to visit.    Review of Systems Patient denies any headache, lightheadedness or dizziness.  No sinus or allergy symptoms.  No chest pain, tightness or palpitations.  No increased shortness of breath, cough or congestion.  No nausea or vomiting.  No acid reflux, dysphagia or odynophagia.  Prilosec controls her symptoms.  No abdominal pain or cramping.  No bowel significant bowel change.  Rectal irritation as outlined.  No pain with bm.       Objective:   Physical Exam  Filed Vitals:    06/24/13 1026  BP: 120/80  Pulse: 70  Temp: 98.3 F (40.48 C)   67 year old female in no acute distress.   HEENT:  Nares- clear.  Oropharynx - without lesions. NECK:  Supple.  Nontender.  No audible bruit.  HEART:  Appears to be regular. LUNGS:  No crackles or wheezing audible.  Respirations even and unlabored.  RADIAL PULSE:  Equal bilaterally.    BREASTS:  No nipple discharge or nipple retraction present.  Could not appreciate any distinct nodules or axillary adenopathy.  ABDOMEN:  Soft, nontender.  Bowel sounds present and normal.  No audible abdominal bruit.  GU:  Noto performed.     RECTAL:  Performed last visit.   EXTREMITIES:  No increased edema present.  DP pulses palpable and equal bilaterally.          Assessment & Plan:  RECTAL IRRITATION.  Used analpram.  Persistent irritation.  Request further evaluation.  Refer to Dr Lemar Livings for evaluation.    THYROID FULLNESS.  Has been worked up.  Follow TSH.   GI.  Colonoscopy 09/22/08 revealed internal hemorrhoids.  Recommended repeat in five years.  Upper symptoms controlled on Omeprazole.    PREVIOUS ABNORMAL BREAST EXAM.  Mammogram 09/13/12 - BiRADS II.   Schedule f/u mammo when due.  CARDIOVASCULAR.  Stress test 03/08/10 negative for ischemia.  Currently asymptomatic.    ;PULMONARY.  PFTs revealed mild obstruction and moderately decreased diffusion capacity.  Findings c/w COPD.  CT chest 04/15/10 negative.  Breathing stable.  Continue risk factor modification.   HEALTH MAINTENANCE.  Physical today.  Mammogram 09/13/12- BiRADS II.  Colonoscopy as outlined.

## 2013-06-24 NOTE — Progress Notes (Signed)
Pre-visit discussion using our clinic review tool. No additional management support is needed unless otherwise documented below in the visit note.  

## 2013-06-26 ENCOUNTER — Encounter: Payer: Self-pay | Admitting: Internal Medicine

## 2013-06-26 NOTE — Assessment & Plan Note (Signed)
On prilosec.  Symptoms controlled.   

## 2013-06-26 NOTE — Assessment & Plan Note (Signed)
Low cholesterol diet and exercise.  Follow lipid panel.  Lipid panel 06/20/13 revealed total cholesterol 22, triglycerides 92, HDL 48 and LDL 164.  Pt states she has not been watching what she eats.  Desires not to start cholesterol medication.  Will follow.  If persistent elevation, will require medication.

## 2013-06-26 NOTE — Assessment & Plan Note (Signed)
Has tried fosamax and boniva - caused bone pain.  Bone density 05/14/10 revealed osteoporosis with some decrease from previous.  Have discussed other treatment options.  Continue vitamin D.    

## 2013-06-27 ENCOUNTER — Encounter: Payer: Self-pay | Admitting: Emergency Medicine

## 2013-06-28 ENCOUNTER — Encounter: Payer: Self-pay | Admitting: Internal Medicine

## 2013-06-29 ENCOUNTER — Other Ambulatory Visit: Payer: Self-pay | Admitting: *Deleted

## 2013-06-29 MED ORDER — ZOSTER VACCINE LIVE 19400 UNT/0.65ML ~~LOC~~ SOLR: 0.6500 mL | Freq: Once | SUBCUTANEOUS | Status: DC

## 2013-06-30 ENCOUNTER — Other Ambulatory Visit: Payer: Self-pay | Admitting: *Deleted

## 2013-07-28 ENCOUNTER — Ambulatory Visit: Payer: Self-pay | Admitting: General Surgery

## 2013-08-01 ENCOUNTER — Ambulatory Visit: Payer: Self-pay | Admitting: General Surgery

## 2013-08-30 ENCOUNTER — Ambulatory Visit: Payer: Self-pay | Admitting: General Surgery

## 2013-09-20 ENCOUNTER — Ambulatory Visit: Payer: Self-pay | Admitting: Internal Medicine

## 2013-09-20 LAB — HM MAMMOGRAPHY

## 2013-09-20 IMAGING — MG MM DIGITAL SCREENING BILAT W/ CAD
3 series · 8 of 8 positions shown · non-contrast
Comparison: none

[R CC · right · 6 of 6 slices shown]
[im 1/6]
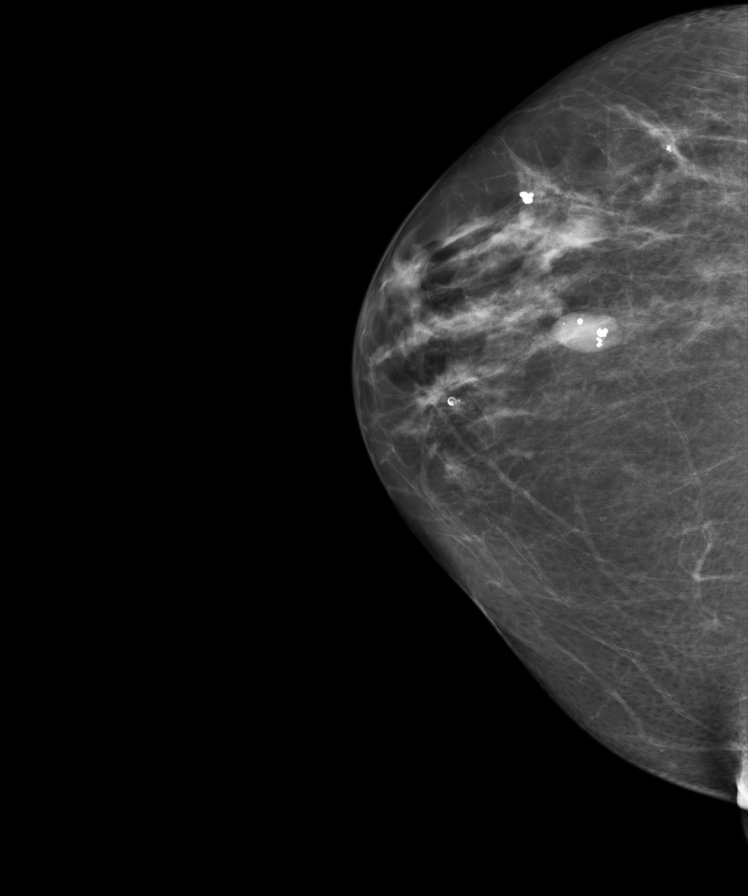
[im 2/6]
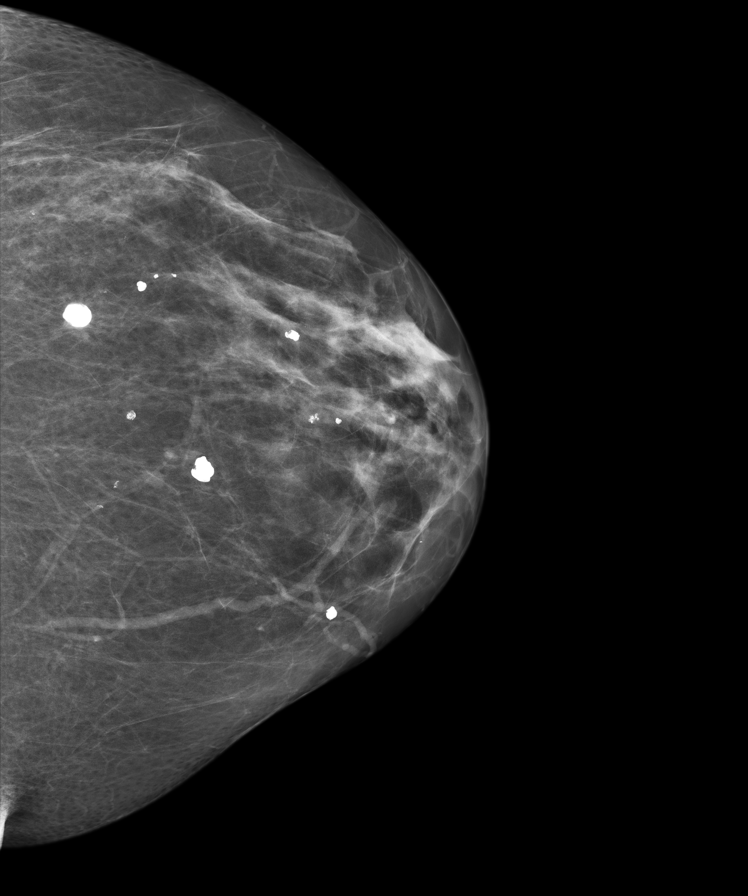
[im 3/6]
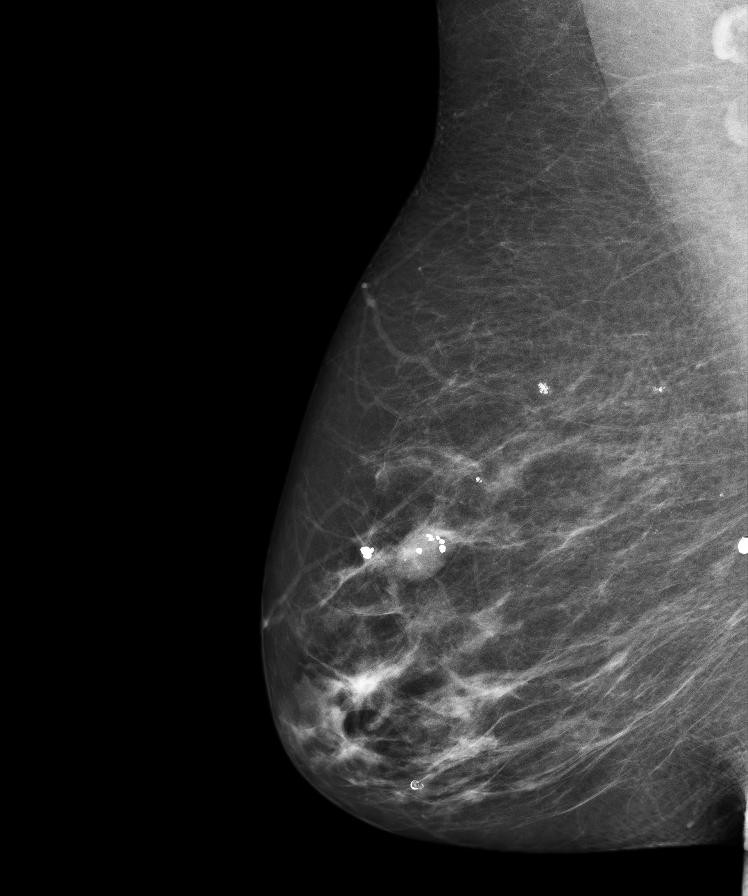
[im 4/6]
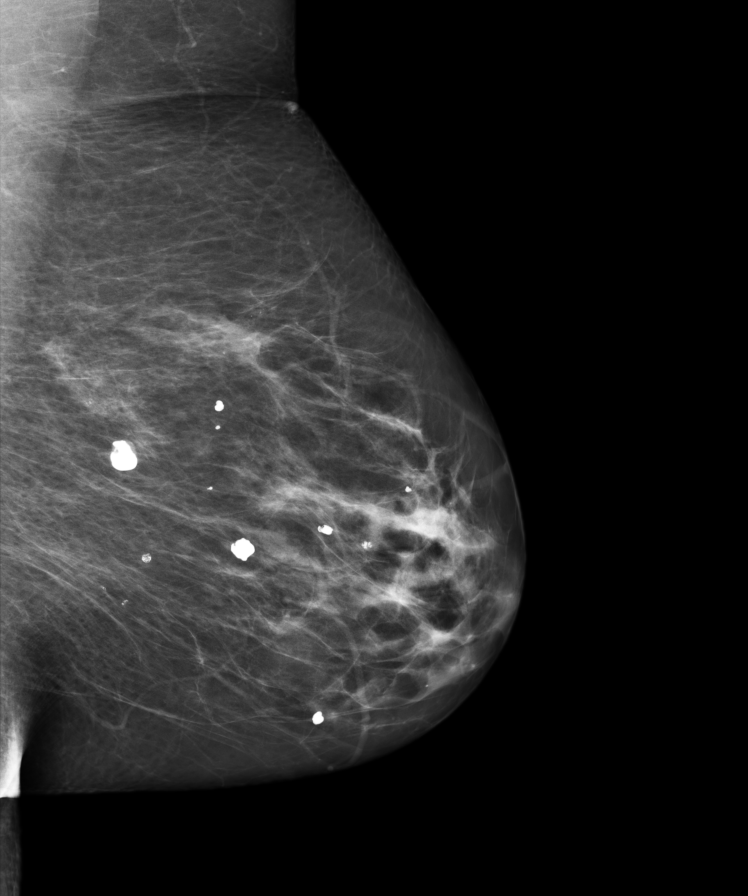
[im 5/6]
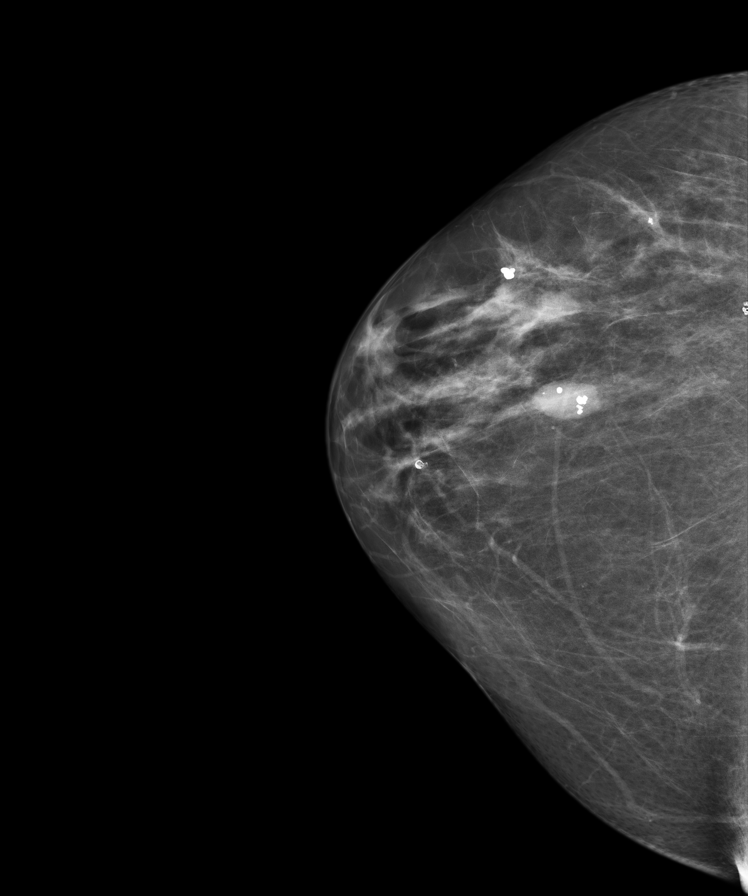
[im 6/6]
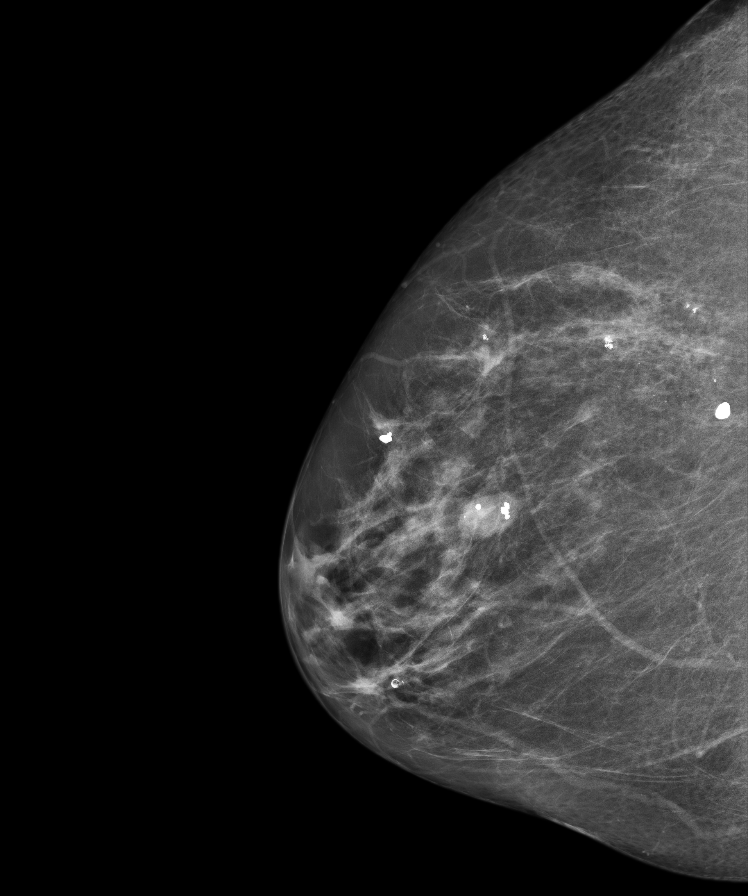

[L MLO]
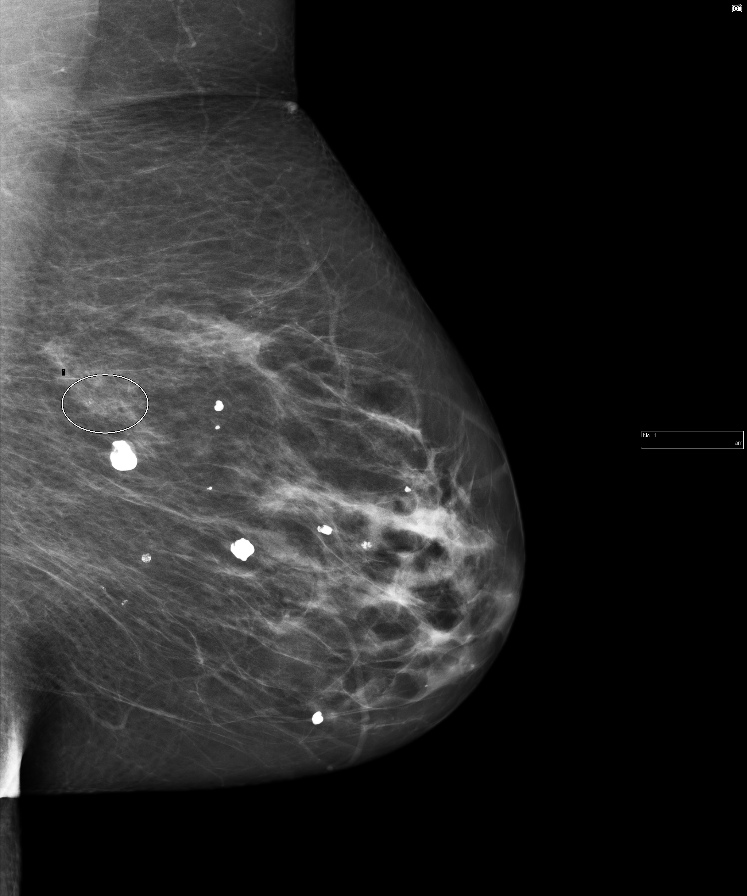

[L CC]
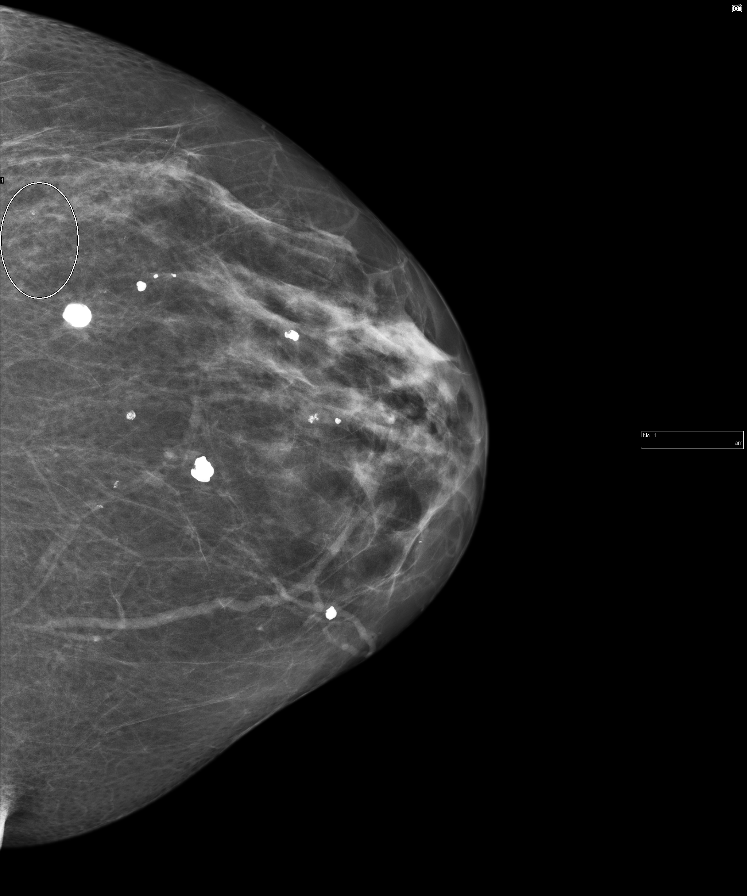

[8 of 8 positions shown; findings below may reference images not displayed]

CLINICAL DATA
Screening.

EXAM
DIGITAL SCREENING BILATERAL MAMMOGRAM WITH CAD

COMPARISON
Previous Exam(s)

ACR BREAST DENSITY
ACR Breast Density Category b: There are scattered areas of
fibroglandular density.

FINDINGS
In the left breast, calcifications warrant further evaluation with
magnified views. In the right breast, no findings suspicious for
malignancy. Images were processed with CAD.

IMPRESSION
Further evaluation is suggested for calcifications in the left
breast.

RECOMMENDATION
Diagnostic mammogram of the left breast. (Code:[UA])

The patient will be contacted regarding the findings, and additional
imaging will be scheduled.

BI-RADS CATEGORY
0: Incomplete. Need additional imaging evaluation and/or prior
mammograms for comparison.

SIGNATURE

## 2013-09-21 ENCOUNTER — Encounter: Payer: Self-pay | Admitting: General Surgery

## 2013-09-21 ENCOUNTER — Ambulatory Visit (INDEPENDENT_AMBULATORY_CARE_PROVIDER_SITE_OTHER): Payer: Medicare Other | Admitting: General Surgery

## 2013-09-21 ENCOUNTER — Encounter: Payer: Self-pay | Admitting: Internal Medicine

## 2013-09-21 VITALS — BP 110/80 | HR 74 | Resp 12 | Ht 64.0 in | Wt 190.0 lb

## 2013-09-21 DIAGNOSIS — K648 Other hemorrhoids: Secondary | ICD-10-CM

## 2013-09-21 DIAGNOSIS — K644 Residual hemorrhoidal skin tags: Secondary | ICD-10-CM

## 2013-09-21 LAB — HEMOCCULT GUIAC POC 1CARD (OFFICE): Fecal Occult Blood, POC: NEGATIVE

## 2013-09-21 NOTE — Progress Notes (Signed)
Patient ID: Alexis Reed, female   DOB: [birth year], 68 y.o.   MRN: [id number]  Chief Complaint  Patient presents with  . Other    skin tags    HPI Alexis Reed is a 68 y.o. female who presents for an evaluation of a skin tag on her rectum. The patient states she noticed it last summer.  She states she was walking a lot at that time and that area became irritated from rubbing. She is not having any problems at this time. She denies any rectal bleeding or pain at this time. The patient denies any pain with defecation. Rare episodes of seeing a spot of blood on the toilet tissue.  HPI  Past Medical History  Diagnosis Date  . Hypercholesterolemia   . Arthritis   . Fibroids     s/p hysterectomy  . Benign breast lumps     multiple lumps biopsy and remove x's 5 last being 1979 by Dr. Sharlet Salina  . Osteoporosis     Past Surgical History  Procedure Laterality Date  . Abdominal hysterectomy  1997    with bilateral oophorectomy  . Tonsillectomy    . Breast biopsy Right 1975    multiple biopsies done  . Breast biopsy Left     multiple done  . Breast surgery Left     lump removed. Unsure of date  . Breast surgery Right     lump removed. Unsure of date  . Colonoscopy  2010    Dr. Vira Agar    Family History  Problem Relation Age of Onset  . Colon cancer Father   . Stroke Father   . Heart disease Father     myocardial infarction  . Breast cancer Neg Hx     Social History History  Substance Use Topics  . Smoking status: Former Smoker -- 1.00 packs/day for 30 years    Types: Cigarettes  . Smokeless tobacco: Never Used  . Alcohol Use: Yes     Comment: occassional    Allergies  Allergen Reactions  . Boniva [Ibandronic Acid] Other (See Comments)    Aching   . Codeine Other (See Comments)    Upset stomach  . Fosamax [Alendronate Sodium]     Caused aching  . Penicillins     Facial swelling    Current Outpatient Prescriptions  Medication Sig Dispense Refill  .  Calcium Carbonate-Vitamin D (CALCIUM 600-D) 600-400 MG-UNIT per tablet Take 1 tablet by mouth 2 (two) times daily.      . Cholecalciferol (VITAMIN D) 2000 UNITS tablet Take 2,000 Units by mouth daily.      . fish oil-omega-3 fatty acids 1000 MG capsule Take 1 g by mouth daily.      . meloxicam (MOBIC) 7.5 MG tablet Take 1 tablet (7.5 mg total) by mouth daily as needed.  30 tablet  2  . omeprazole (PRILOSEC) 20 MG capsule Take 1 capsule (20 mg total) by mouth daily.  30 capsule  5  . pramoxine-hydrocortisone (PROCTOCREAM-HC) 1-1 % rectal cream Place rectally 2 (two) times daily as needed.      . zoster vaccine live, PF, (ZOSTAVAX) 85277 UNT/0.65ML injection Inject 19,400 Units into the skin once.  1 each  0   No current facility-administered medications for this visit.    Review of Systems Review of Systems  Constitutional: Negative.   Respiratory: Negative.   Cardiovascular: Negative.   Gastrointestinal: Negative.     Blood pressure 110/80, pulse 74, resp. rate 12, height 5\' 4"  (1.626  m), weight 190 lb (86.183 kg), last menstrual period 07/20/1995.  Physical Exam Physical Exam  Constitutional: She is oriented to person, place, and time. She appears well-developed and well-nourished.  Neck: Neck supple. No thyromegaly present.  Cardiovascular: Normal rate, regular rhythm and normal heart sounds.   No murmur heard. Pulmonary/Chest: Effort normal and breath sounds normal.  Abdominal: Soft. Normal appearance and bowel sounds are normal. There is no hepatosplenomegaly. There is no tenderness. No hernia.  Genitourinary: Rectal exam shows internal hemorrhoid.     Lymphadenopathy:    She has no cervical adenopathy.  Neurological: She is alert and oriented to person, place, and time.  Skin: Skin is warm and dry.    Data Reviewed Previous colonoscopy 2010. No malignancy. Alexis Reed, M.D. Repeat study due 2015.  Assessment    A posterior anal skin tag was noted without fissure.   A. Symptomatic internal hemorrhoid.    Plan    The patient is minimally symptomatic, it is likely that the pain she would experience post-excision would be unpleasant at best. She was encouraged to avoid use of soap on the perineum for cleansing. Over-the-counter hydrocortisone cream during areas of inflammation would be appropriate.If the area becomes more consistently symptomatic, we could discuss excision.  The patient will be in touch with Dr. Vira Agar to arrange for follow up exam due to were family history of colon cancer.        Robert Bellow 09/24/2013, 3:05 PM

## 2013-09-21 NOTE — Patient Instructions (Signed)
The patient is aware to call back for any questions or concerns. Patient to return as needed. Patient advised to use baby wipes or tucks pads to clean with.

## 2013-09-24 DIAGNOSIS — K648 Other hemorrhoids: Secondary | ICD-10-CM | POA: Insufficient documentation

## 2013-09-24 DIAGNOSIS — K644 Residual hemorrhoidal skin tags: Secondary | ICD-10-CM | POA: Insufficient documentation

## 2013-09-26 ENCOUNTER — Ambulatory Visit: Payer: Self-pay | Admitting: Internal Medicine

## 2013-09-26 ENCOUNTER — Encounter: Payer: Self-pay | Admitting: Internal Medicine

## 2013-09-26 LAB — HM MAMMOGRAPHY

## 2013-09-26 IMAGING — MG MM ADDITIONAL VIEWS AT NO CHARGE
3 series · 4 of 4 positions shown · non-contrast
Comparison: [DATE] and earlier priors dating back to [DATE]

CLINICAL DATA: Calcifications left breast identified on recent
screening mammogram.

EXAM:
DIGITAL DIAGNOSTIC  LEFT MAMMOGRAM

[L CC · left · 2 of 2 slices shown (1 of 2)]
[im 1/2]
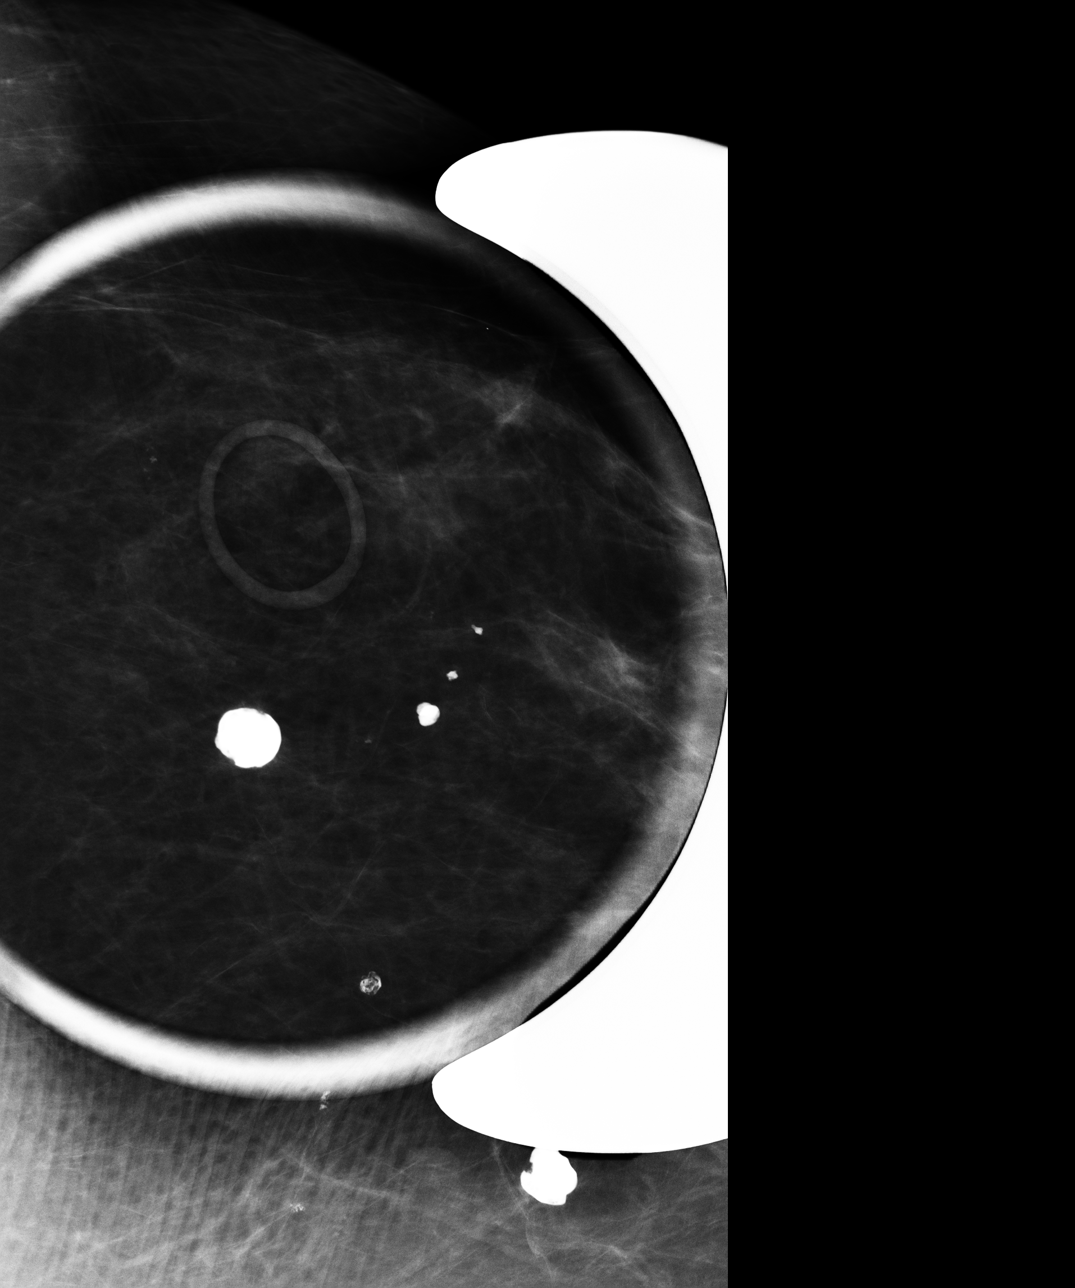
[im 2/2]
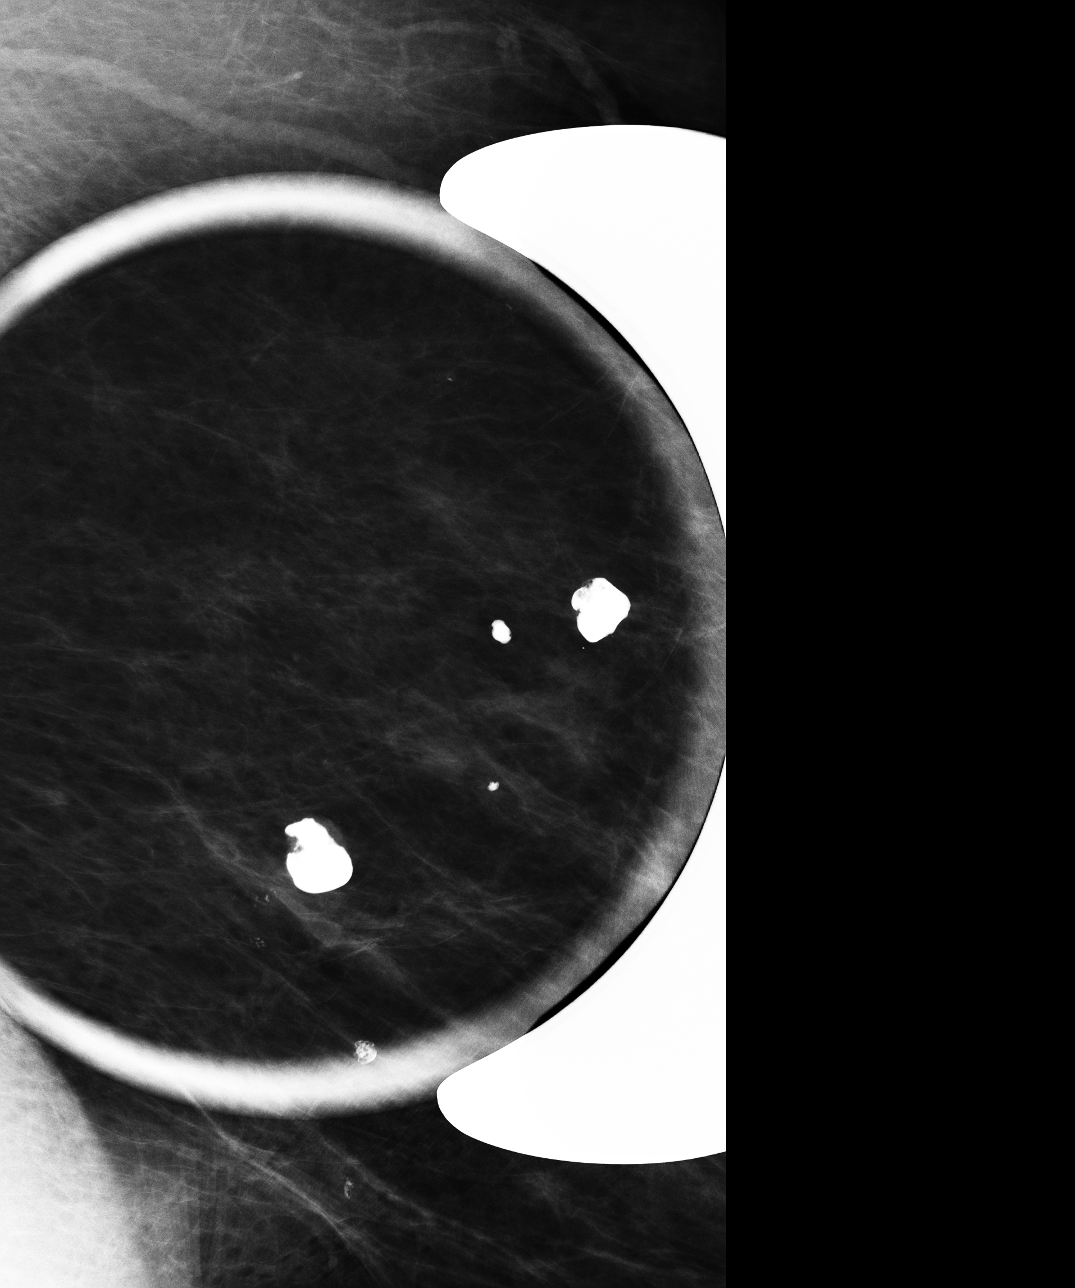

[L ML]
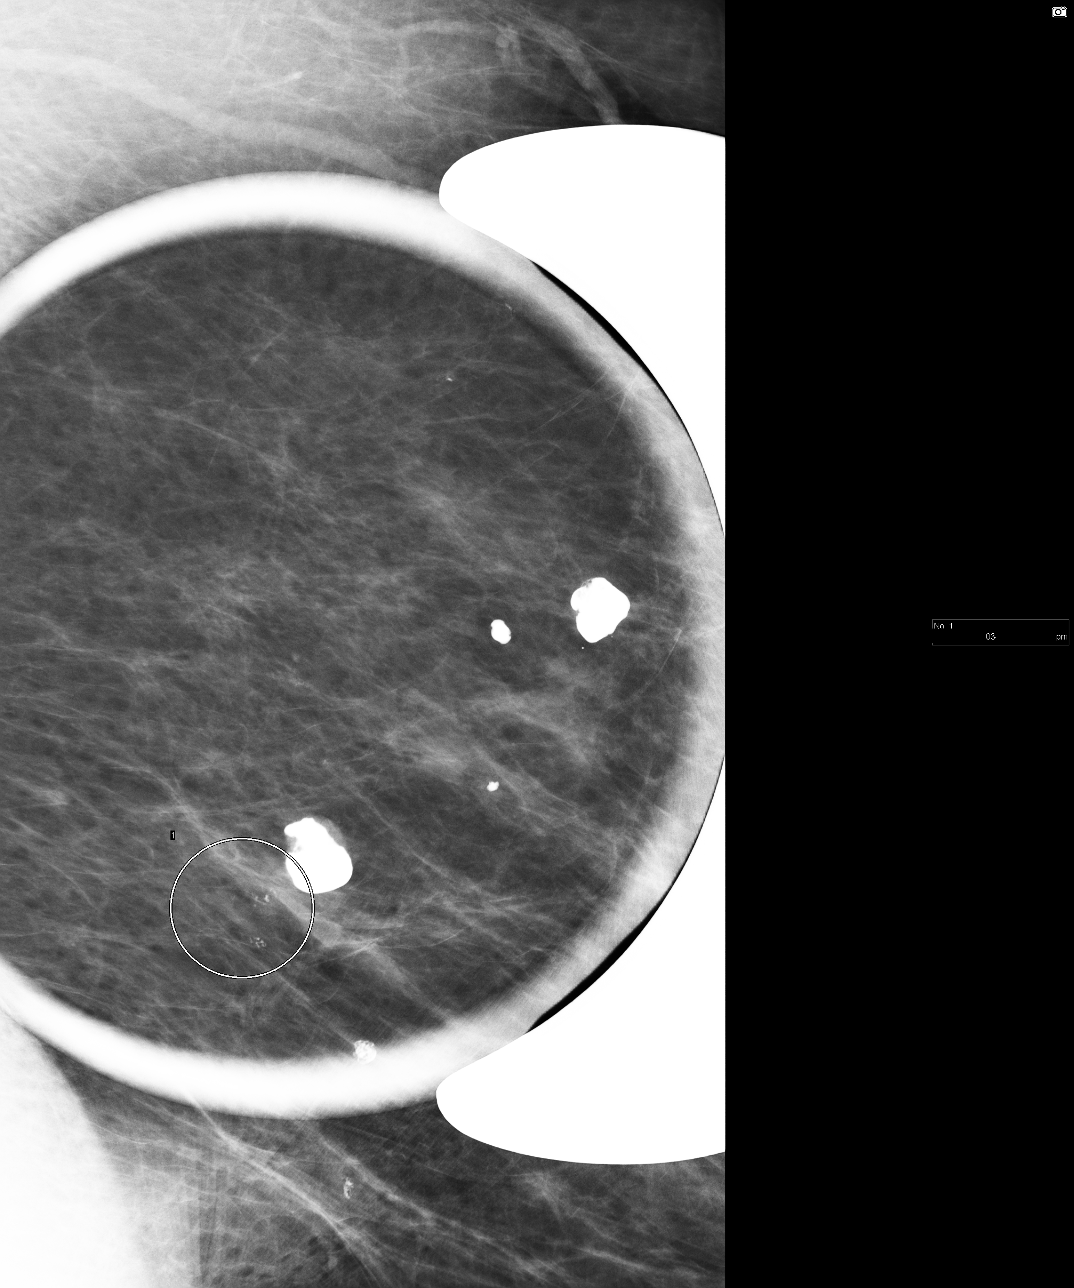

[L CC (2 of 2)]
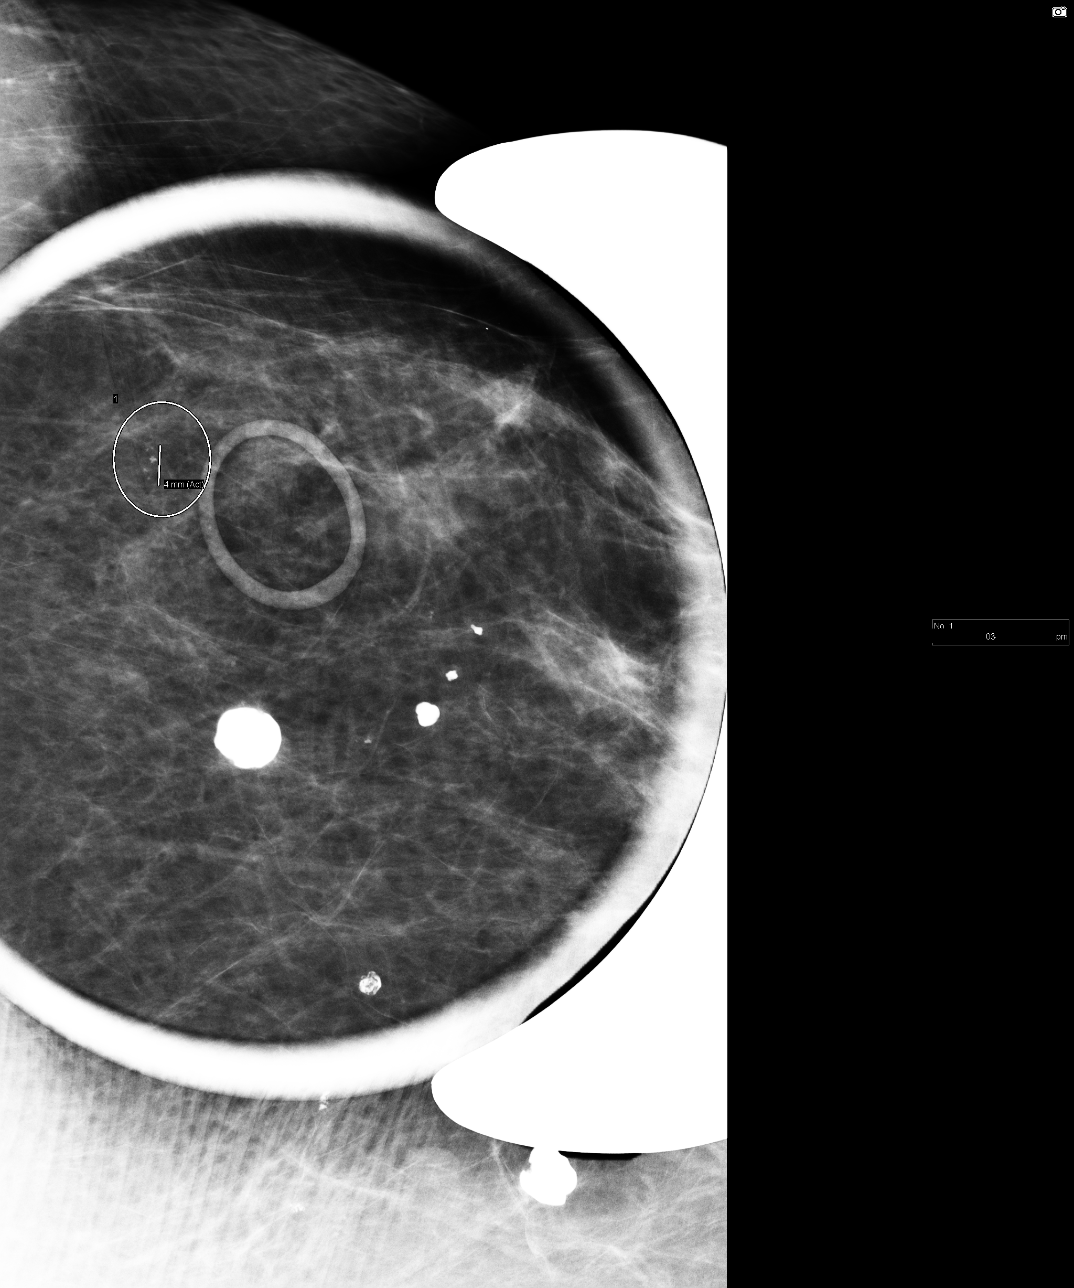

[4 of 4 positions shown; findings below may reference images not displayed]

ACR Breast Density Category b: There are scattered areas of
fibroglandular density.
FINDINGS: The patient has multiple coarsely calcified degenerating
fibroadenomas in the left breast. The area of potential concern on
recent mammogram is evaluated with magnification views, and
demonstrates a new cluster of coarse microcalcifications in the
outer left breast that span 7 mm. These are favored to be benign
fibroadenomatoid calcifications. No suspicious linear forms are
seen.
IMPRESSION: Probably benign 7 mm group of calcifications in the outer left
breast.

RECOMMENDATION:
Diagnostic left mammogram in 6 months is recommended.

I have discussed the findings and recommendations with the patient.
Results were also provided in writing at the conclusion of the
visit. If applicable, a reminder letter will be sent to the patient
regarding the next appointment.

BI-RADS CATEGORY  3: Probably benign finding(s) - short interval
follow-up suggested.

## 2013-12-21 ENCOUNTER — Other Ambulatory Visit: Payer: Medicare Other

## 2013-12-22 ENCOUNTER — Encounter: Payer: Self-pay | Admitting: Internal Medicine

## 2013-12-22 ENCOUNTER — Other Ambulatory Visit: Payer: Self-pay | Admitting: *Deleted

## 2013-12-22 MED ORDER — MELOXICAM 7.5 MG PO TABS: 7.5000 mg | ORAL_TABLET | Freq: Every day | ORAL | Status: DC | PRN

## 2013-12-22 NOTE — Telephone Encounter (Signed)
Refilled meloxicam #30 with one refill.  Needs to keep lab appt.

## 2013-12-22 NOTE — Telephone Encounter (Signed)
Last visit 06/24/13, appt sch 7/15. Refill?

## 2013-12-23 MED ORDER — OMEPRAZOLE 20 MG PO CPDR: 20.0000 mg | DELAYED_RELEASE_CAPSULE | Freq: Every day | ORAL | Status: DC

## 2013-12-27 ENCOUNTER — Ambulatory Visit: Payer: Medicare Other | Admitting: Internal Medicine

## 2014-01-05 ENCOUNTER — Other Ambulatory Visit (INDEPENDENT_AMBULATORY_CARE_PROVIDER_SITE_OTHER): Payer: Medicare Other

## 2014-01-05 DIAGNOSIS — E7800 Pure hypercholesterolemia, unspecified: Secondary | ICD-10-CM

## 2014-01-05 DIAGNOSIS — M810 Age-related osteoporosis without current pathological fracture: Secondary | ICD-10-CM

## 2014-01-05 LAB — LIPID PANEL
Cholesterol: 216 mg/dL — ABNORMAL HIGH (ref 0–200)
HDL: 46.3 mg/dL (ref >39.00)
LDL Cholesterol: 144 mg/dL — ABNORMAL HIGH (ref 0–99)
NonHDL: 169.7
Total CHOL/HDL Ratio: 5
Triglycerides: 130 mg/dL (ref 0.0–149.0)
VLDL: 26 mg/dL (ref 0.0–40.0)

## 2014-01-05 LAB — COMPREHENSIVE METABOLIC PANEL
ALT: 13 U/L (ref 0–35)
AST: 17 U/L (ref 0–37)
Albumin: 4.1 g/dL (ref 3.5–5.2)
Alkaline Phosphatase: 82 U/L (ref 39–117)
BUN: 15 mg/dL (ref 6–23)
CO2: 29 mEq/L (ref 19–32)
Calcium: 9.4 mg/dL (ref 8.4–10.5)
Chloride: 106 mEq/L (ref 96–112)
Creatinine, Ser: 0.6 mg/dL (ref 0.4–1.2)
GFR: 110.6 mL/min (ref >60.00)
Glucose, Bld: 93 mg/dL (ref 70–99)
Potassium: 4.3 mEq/L (ref 3.5–5.1)
Sodium: 140 mEq/L (ref 135–145)
Total Bilirubin: 0.6 mg/dL (ref 0.2–1.2)
Total Protein: 7 g/dL (ref 6.0–8.3)

## 2014-01-06 LAB — VITAMIN D 25 HYDROXY (VIT D DEFICIENCY, FRACTURES): Vit D, 25-Hydroxy: 50 ng/mL (ref 30–89)

## 2014-01-07 ENCOUNTER — Encounter: Payer: Self-pay | Admitting: Internal Medicine

## 2014-01-12 ENCOUNTER — Encounter: Payer: Self-pay | Admitting: Internal Medicine

## 2014-01-12 ENCOUNTER — Ambulatory Visit (INDEPENDENT_AMBULATORY_CARE_PROVIDER_SITE_OTHER): Payer: Medicare Other | Admitting: Internal Medicine

## 2014-01-12 VITALS — BP 110/70 | HR 71 | Temp 98.3°F | Ht 63.75 in | Wt 184.5 lb

## 2014-01-12 DIAGNOSIS — M810 Age-related osteoporosis without current pathological fracture: Secondary | ICD-10-CM

## 2014-01-12 DIAGNOSIS — R059 Cough, unspecified: Secondary | ICD-10-CM

## 2014-01-12 DIAGNOSIS — E7800 Pure hypercholesterolemia, unspecified: Secondary | ICD-10-CM

## 2014-01-12 DIAGNOSIS — K648 Other hemorrhoids: Secondary | ICD-10-CM

## 2014-01-12 DIAGNOSIS — R05 Cough: Secondary | ICD-10-CM

## 2014-01-12 DIAGNOSIS — K219 Gastro-esophageal reflux disease without esophagitis: Secondary | ICD-10-CM

## 2014-01-15 ENCOUNTER — Encounter: Payer: Self-pay | Admitting: Internal Medicine

## 2014-01-15 DIAGNOSIS — R059 Cough, unspecified: Secondary | ICD-10-CM | POA: Insufficient documentation

## 2014-01-15 DIAGNOSIS — R05 Cough: Secondary | ICD-10-CM | POA: Insufficient documentation

## 2014-01-15 NOTE — Assessment & Plan Note (Signed)
Cough in pt with previous smoking history.  Check cxr.

## 2014-01-15 NOTE — Progress Notes (Signed)
Subjective:    Patient ID: Alexis Reed, female    DOB: Mar 20, 1946, 68 y.o.   MRN: [id number]  HPI 67 year old female with past history of hypercholesterolemia who comes in today for a scheduled follow up.  States she is doing well.  Stays active.  No cardiac symptoms with increased activity or exertion.  Breathing stable.  Eating and drinking well.  Bowels stable.  She saw Dr Bary Castilla for rectal irritation.  See his note.  Decided to watch.  Since stopping her fish oil, rectal irritation has resolved.  Bowels are better.  Previous smoking history.  Some cough.  No wheezing.        Past Medical History  Diagnosis Date  . Hypercholesterolemia   . Arthritis   . Fibroids     s/p hysterectomy  . Benign breast lumps     multiple lumps biopsy and remove x's 5 last being 1979 by Dr. Sharlet Salina  . Osteoporosis     Current Outpatient Prescriptions on File Prior to Visit  Medication Sig Dispense Refill  . Calcium Carbonate-Vitamin D (CALCIUM 600-D) 600-400 MG-UNIT per tablet Take 1 tablet by mouth 2 (two) times daily.      . Cholecalciferol (VITAMIN D) 2000 UNITS tablet Take 2,000 Units by mouth daily.      . meloxicam (MOBIC) 7.5 MG tablet Take 1 tablet (7.5 mg total) by mouth daily as needed.  30 tablet  1  . omeprazole (PRILOSEC) 20 MG capsule Take 1 capsule (20 mg total) by mouth daily.  30 capsule  5  . pramoxine-hydrocortisone (PROCTOCREAM-HC) 1-1 % rectal cream Place rectally 2 (two) times daily as needed.      . zoster vaccine live, PF, (ZOSTAVAX) 93790 UNT/0.65ML injection Inject 19,400 Units into the skin once.  1 each  0   No current facility-administered medications on file prior to visit.    Review of Systems Patient denies any headache, lightheadedness or dizziness.  No sinus or allergy symptoms.  No chest pain, tightness or palpitations.  No increased shortness of breath or congestion.  Some cough.  No nausea or vomiting.  No acid reflux, dysphagia or odynophagia.  Prilosec  controls her symptoms.  No abdominal pain or cramping.  Bowels better.  Rectal irritation resolved.        Objective:   Physical Exam  Filed Vitals:   01/12/14 1117  BP: 110/70  Pulse: 71  Temp: 98.3 F (63.29 C)   68 year old female in no acute distress.   HEENT:  Nares- clear.  Oropharynx - without lesions. NECK:  Supple.  Nontender.  No audible bruit.  HEART:  Appears to be regular. LUNGS:  No crackles or wheezing audible.  Respirations even and unlabored.  RADIAL PULSE:  Equal bilaterally.  ABDOMEN:  Soft, nontender.  Bowel sounds present and normal.  No audible abdominal bruit.  EXTREMITIES:  No increased edema present.  DP pulses palpable and equal bilaterally.          Assessment & Plan:  THYROID FULLNESS.  Has been worked up.  Follow TSH.   GI.  Colonoscopy 09/22/08 revealed internal hemorrhoids.  Recommended repeat in five years.  Upper symptoms controlled on Omeprazole.    PREVIOUS ABNORMAL BREAST EXAM.  Mammogram 09/26/13 - BiRADS II.      CARDIOVASCULAR.  Stress test 03/08/10 negative for ischemia.  Currently asymptomatic.    ;PULMONARY.  PFTs revealed mild obstruction and moderately decreased diffusion capacity.  Findings c/w COPD.  CT chest 04/15/10 negative.  Some cough.  Smoking history.  Check cxr.  Continue risk factor modification.   HEALTH MAINTENANCE.  Physical 06/24/13.  Mammogram 09/26/13- BiRADS I.  Colonoscopy as outlined.

## 2014-01-15 NOTE — Assessment & Plan Note (Signed)
Has tried fosamax and boniva - caused bone pain.  Bone density 05/14/10 revealed osteoporosis with some decrease from previous.  Have discussed other treatment options.  Continue vitamin D.

## 2014-01-15 NOTE — Assessment & Plan Note (Signed)
Bowels better since stopping fish oil.  No rectal irritation.

## 2014-01-15 NOTE — Assessment & Plan Note (Signed)
On prilosec.  Symptoms controlled.   

## 2014-01-15 NOTE — Assessment & Plan Note (Signed)
Low cholesterol diet and exercise.  Follow lipid panel.  Lipid panel just checked revealed total cholesterol 216, triglycerides 130, HDL 46 and LDL 144.   Desires not to start cholesterol medication.  Will follow.  If persistent elevation, will require medication.

## 2014-01-18 ENCOUNTER — Ambulatory Visit (INDEPENDENT_AMBULATORY_CARE_PROVIDER_SITE_OTHER)
Admission: RE | Admit: 2014-01-18 | Discharge: 2014-01-18 | Disposition: A | Discharge status: Home / Self Care | Payer: Medicare Other | Source: Ambulatory Visit | Attending: Internal Medicine | Admitting: Internal Medicine

## 2014-01-18 DIAGNOSIS — R059 Cough, unspecified: Secondary | ICD-10-CM

## 2014-01-18 DIAGNOSIS — R05 Cough: Secondary | ICD-10-CM

## 2014-01-18 IMAGING — CR DG CHEST 2V
2 series · 2 of 2 positions shown · non-contrast
Comparison: CT chest [DATE].

CLINICAL DATA: Cough.

EXAM:
CHEST  2 VIEW

[view not recorded (1 of 2)]
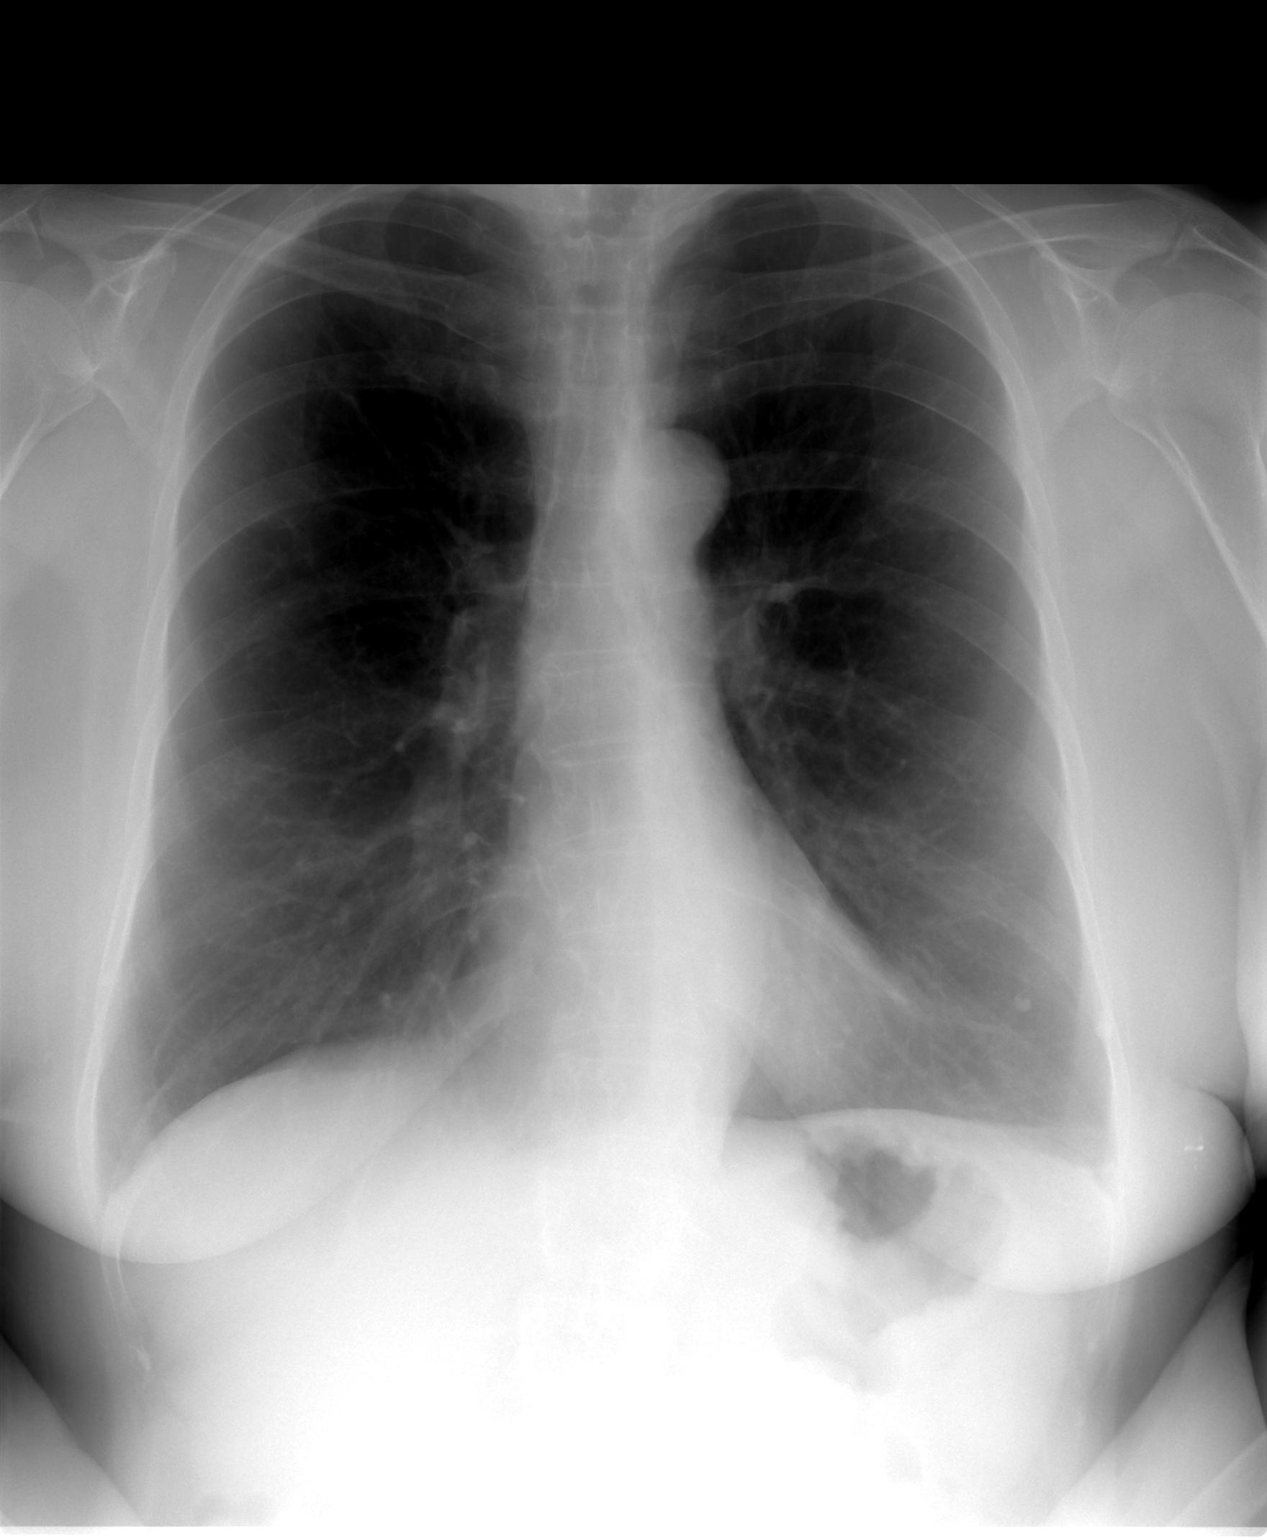

[view not recorded (2 of 2)]
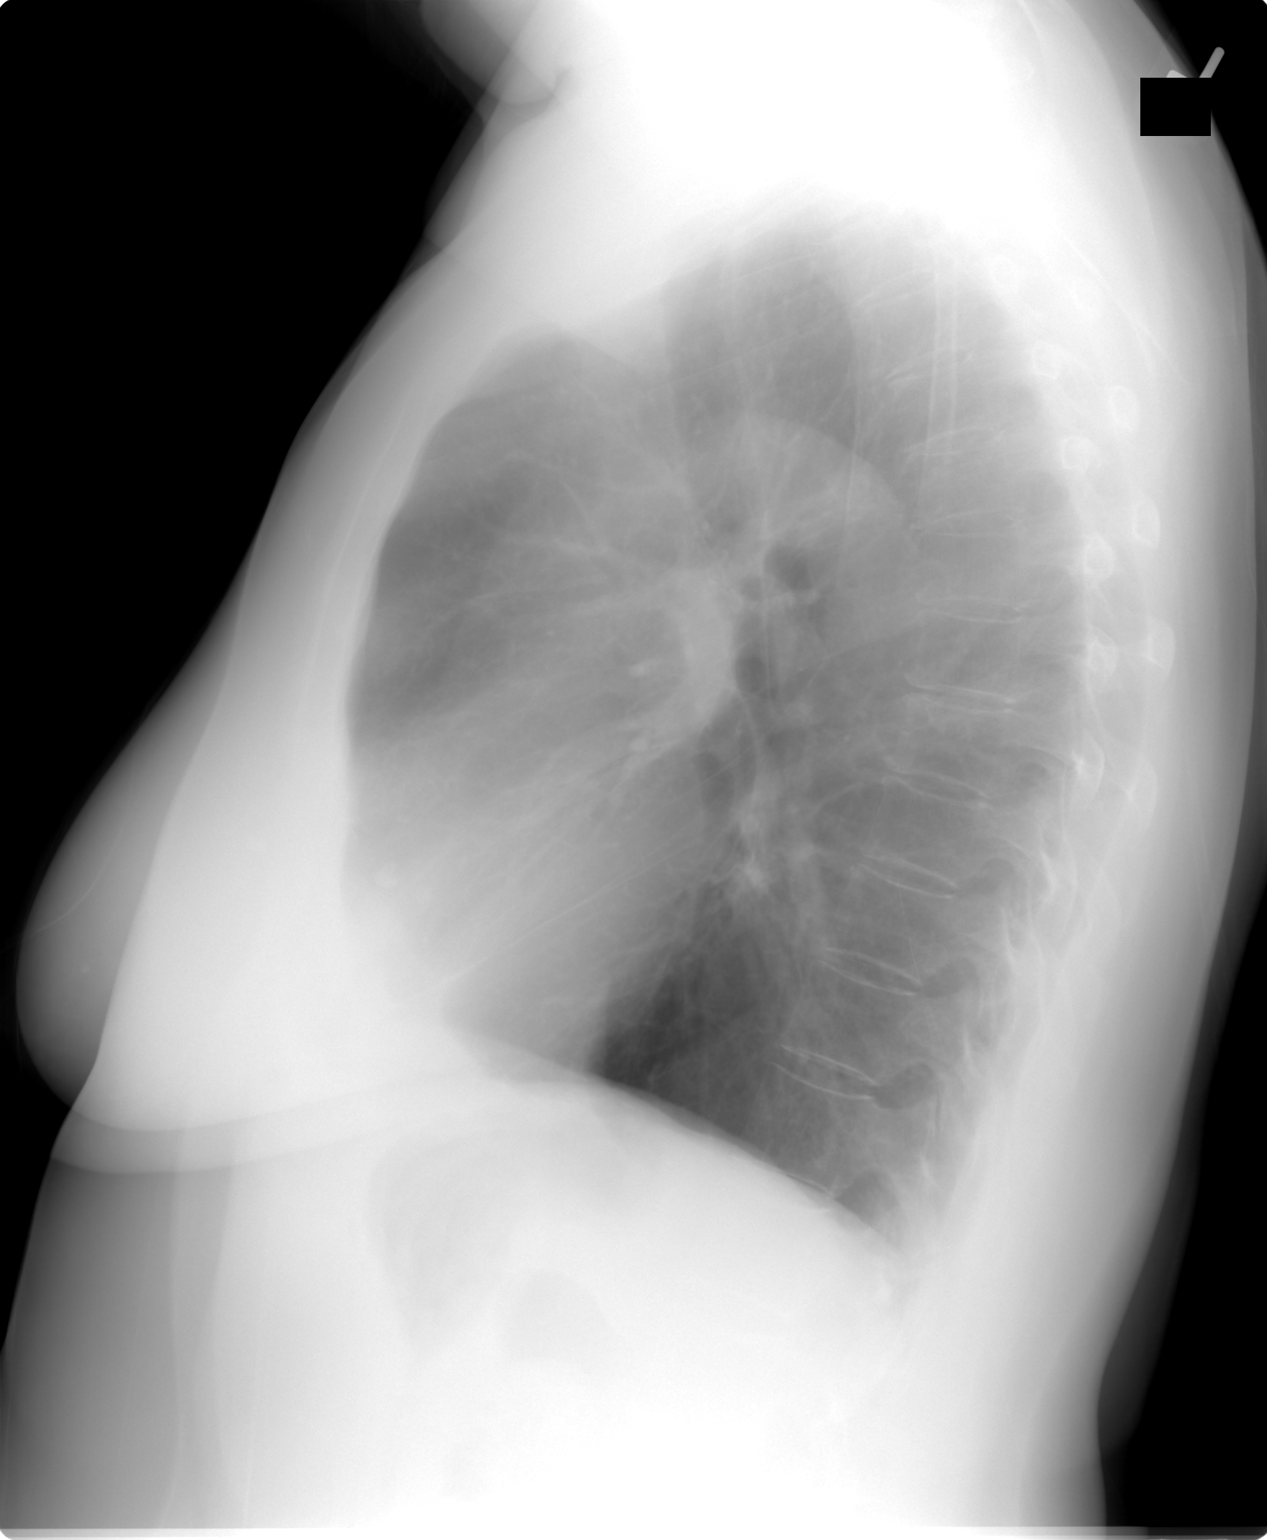

[2 of 2 positions shown; findings below may reference images not displayed]

FINDINGS: The lungs are emphysematous but clear. Heart size is normal. No
pneumothorax or pleural effusion. Calcifications left breast are
identified and unchanged.
IMPRESSION: Emphysema without acute disease.

## 2014-01-19 ENCOUNTER — Encounter: Payer: Self-pay | Admitting: Internal Medicine

## 2014-02-20 ENCOUNTER — Encounter: Payer: Self-pay | Admitting: Internal Medicine

## 2014-02-20 NOTE — Telephone Encounter (Signed)
Spoke with pt, denies any acute symptoms, no CP. Appt scheduled for tomorrow at 11:45 per Dr. Nicki Reaper

## 2014-02-21 ENCOUNTER — Ambulatory Visit (INDEPENDENT_AMBULATORY_CARE_PROVIDER_SITE_OTHER): Payer: Medicare Other | Admitting: Internal Medicine

## 2014-02-21 ENCOUNTER — Encounter: Payer: Self-pay | Admitting: Internal Medicine

## 2014-02-21 VITALS — BP 106/66 | HR 76 | Resp 14 | Ht 63.75 in | Wt 184.4 lb

## 2014-02-21 DIAGNOSIS — K219 Gastro-esophageal reflux disease without esophagitis: Secondary | ICD-10-CM

## 2014-02-21 DIAGNOSIS — F439 Reaction to severe stress, unspecified: Secondary | ICD-10-CM

## 2014-02-21 DIAGNOSIS — Z733 Stress, not elsewhere classified: Secondary | ICD-10-CM

## 2014-02-21 DIAGNOSIS — R0602 Shortness of breath: Secondary | ICD-10-CM

## 2014-02-21 DIAGNOSIS — R059 Cough, unspecified: Secondary | ICD-10-CM

## 2014-02-21 DIAGNOSIS — R002 Palpitations: Secondary | ICD-10-CM

## 2014-02-21 DIAGNOSIS — R05 Cough: Secondary | ICD-10-CM

## 2014-02-21 NOTE — Progress Notes (Signed)
Pre visit review using our clinic review tool, if applicable. No additional management support is needed unless otherwise documented below in the visit note. 

## 2014-02-25 ENCOUNTER — Encounter: Payer: Self-pay | Admitting: Internal Medicine

## 2014-02-25 DIAGNOSIS — F439 Reaction to severe stress, unspecified: Secondary | ICD-10-CM | POA: Insufficient documentation

## 2014-02-25 DIAGNOSIS — R0602 Shortness of breath: Secondary | ICD-10-CM | POA: Insufficient documentation

## 2014-02-25 NOTE — Progress Notes (Signed)
  Subjective:    Patient ID: Alexis Reed, female    DOB: 1946/04/20, 68 y.o.   MRN: [id number]  Shortness of Breath  Palpitations  Associated symptoms include shortness of breath.  68 year old female with past history of hypercholesterolemia who comes in today as a work in with concerns regarding some sob with exertion and increased heart rate and palpitations.  She has noticed her heart racing more.  Has also noticed some sob with exertion, i.e., vacuuming, etc.  No chest pain.  Under increased stress with her mother's medical issues.  She feels she is handling things relatively well.         Past Medical History  Diagnosis Date  . Hypercholesterolemia   . Arthritis   . Fibroids     s/p hysterectomy  . Benign breast lumps     multiple lumps biopsy and remove x's 5 last being 1979 by Dr. Sharlet Salina  . Osteoporosis     Current Outpatient Prescriptions on File Prior to Visit  Medication Sig Dispense Refill  . Calcium Carbonate-Vitamin D (CALCIUM 600-D) 600-400 MG-UNIT per tablet Take 1 tablet by mouth 2 (two) times daily.      . Cholecalciferol (VITAMIN D) 2000 UNITS tablet Take 2,000 Units by mouth daily.      . meloxicam (MOBIC) 7.5 MG tablet Take 1 tablet (7.5 mg total) by mouth daily as needed.  30 tablet  1  . omeprazole (PRILOSEC) 20 MG capsule Take 1 capsule (20 mg total) by mouth daily.  30 capsule  5  . pramoxine-hydrocortisone (PROCTOCREAM-HC) 1-1 % rectal cream Place rectally 2 (two) times daily as needed.       No current facility-administered medications on file prior to visit.    Review of Systems  Respiratory: Positive for shortness of breath.   Cardiovascular: Positive for palpitations.  Patient denies any headache, lightheadedness or dizziness.  No sinus or allergy symptoms.  No chest pain.  Does report increased heart rate and palpitations.  No increased cough or congestion.  Some sob with exertion.   No nausea or vomiting.  No acid reflux, dysphagia or odynophagia.   Prilosec controls her symptoms.  No abdominal pain or cramping.        Objective:   Physical Exam  Filed Vitals:   02/21/14 1143  BP: 106/66  Pulse: 76  Resp: 26   68 year old female in no acute distress.   HEENT:  Nares- clear.  Oropharynx - without lesions. NECK:  Supple.  Nontender.  No audible bruit.  HEART:  Appears to be regular. LUNGS:  No crackles or wheezing audible.  Respirations even and unlabored.  RADIAL PULSE:  Equal bilaterally.  ABDOMEN:  Soft, nontender.  Bowel sounds present and normal.  No audible abdominal bruit.  EXTREMITIES:  No increased edema present.  DP pulses palpable and equal bilaterally.          Assessment & Plan:  PULMONARY.  PFTs revealed mild obstruction and moderately decreased diffusion capacity.  Findings c/w COPD.  CT chest 04/15/10 negative.  CXR after last visit with no acute changes.  Some changes c/w emphysema.    HEALTH MAINTENANCE.  Physical 06/24/13.  Mammogram 09/26/13- BiRADS I.  Colonoscopy as outlined.   I spent 25 minutes with the patient discussing her current symptoms, stress and mother's medical issues and more than 50% of the time was spent in consultation regarding the above.

## 2014-02-25 NOTE — Assessment & Plan Note (Signed)
Increased stress as outlined.  Feels she is handling things well.  Follow.

## 2014-02-25 NOTE — Assessment & Plan Note (Signed)
Describes sob with exertion and increased heart rate as outlined.  EKG obtained and revealed SR with no acute ischemic changes.  Given persistent symptoms and notice of increased sob with exertion, etc, will refer to cardiology for further evaluation.

## 2014-02-25 NOTE — Assessment & Plan Note (Signed)
Cough is better.  CXR as outlined.  Pursue cardiac w/up as outlined.

## 2014-02-25 NOTE — Assessment & Plan Note (Signed)
On prilosec.  Symptoms controlled.   

## 2014-03-01 ENCOUNTER — Telehealth: Payer: Self-pay | Admitting: Internal Medicine

## 2014-03-01 NOTE — Telephone Encounter (Signed)
The patient is aware of her appointment at [facility] on 9.17.15 @ 10:20  For a Diagnostic Uni Left Mammogram for Left Breast Calcification F/U

## 2014-03-08 ENCOUNTER — Encounter: Payer: Self-pay | Admitting: Internal Medicine

## 2014-03-31 ENCOUNTER — Encounter: Payer: Self-pay | Admitting: Internal Medicine

## 2014-03-31 ENCOUNTER — Ambulatory Visit (INDEPENDENT_AMBULATORY_CARE_PROVIDER_SITE_OTHER): Payer: Medicare Other | Admitting: Internal Medicine

## 2014-03-31 VITALS — BP 120/80 | HR 72 | Temp 98.1°F | Ht 63.75 in | Wt 183.8 lb

## 2014-03-31 DIAGNOSIS — R350 Frequency of micturition: Secondary | ICD-10-CM

## 2014-03-31 DIAGNOSIS — N3000 Acute cystitis without hematuria: Secondary | ICD-10-CM

## 2014-03-31 DIAGNOSIS — R3915 Urgency of urination: Secondary | ICD-10-CM

## 2014-03-31 LAB — POCT URINALYSIS DIPSTICK
Bilirubin, UA: NEGATIVE
Glucose, UA: NEGATIVE
Ketones, UA: NEGATIVE
Leukocytes, UA: NEGATIVE
Nitrite, UA: NEGATIVE
Protein, UA: NEGATIVE
Spec Grav, UA: 1.015
Urobilinogen, UA: 0.2
pH, UA: 5.5

## 2014-03-31 MED ORDER — CIPROFLOXACIN HCL 500 MG PO TABS: 500.0000 mg | ORAL_TABLET | Freq: Two times a day (BID) | ORAL | Status: DC

## 2014-03-31 NOTE — Progress Notes (Signed)
Pre visit review using our clinic review tool, if applicable. No additional management support is needed unless otherwise documented below in the visit note. 

## 2014-04-02 ENCOUNTER — Encounter: Payer: Self-pay | Admitting: Internal Medicine

## 2014-04-02 ENCOUNTER — Telehealth: Payer: Self-pay | Admitting: Internal Medicine

## 2014-04-02 DIAGNOSIS — N390 Urinary tract infection, site not specified: Secondary | ICD-10-CM | POA: Insufficient documentation

## 2014-04-02 DIAGNOSIS — R0602 Shortness of breath: Secondary | ICD-10-CM

## 2014-04-02 NOTE — Assessment & Plan Note (Signed)
Symptoms as outlined.  Urine dip c/w uti.  Treat with cipro as directed.  Send culture.  Stay hydrated.  Follow.

## 2014-04-02 NOTE — Progress Notes (Signed)
  Subjective:    Patient ID: Alexis Reed, female    DOB: February 29, 1946, 68 y.o.   MRN: [id number]  Urinary Tract Infection   68 year old female with past history of hypercholesterolemia who comes in today as a work in with concerns regarding dysuria and urgency.  No hematuria.  No nausea or vomiting.  No diarrhea.  Took one of her mother's cipro last night.  Has had uti's in the past.  Feels similar.  No vaginal symptoms.         Past Medical History  Diagnosis Date  . Hypercholesterolemia   . Arthritis   . Fibroids     s/p hysterectomy  . Benign breast lumps     multiple lumps biopsy and remove x's 5 last being 1979 by Dr. Sharlet Salina  . Osteoporosis     Current Outpatient Prescriptions on File Prior to Visit  Medication Sig Dispense Refill  . Calcium Carbonate-Vitamin D (CALCIUM 600-D) 600-400 MG-UNIT per tablet Take 1 tablet by mouth 2 (two) times daily.      . Cholecalciferol (VITAMIN D) 2000 UNITS tablet Take 2,000 Units by mouth daily.      . meloxicam (MOBIC) 7.5 MG tablet Take 1 tablet (7.5 mg total) by mouth daily as needed.  30 tablet  1  . omeprazole (PRILOSEC) 20 MG capsule Take 1 capsule (20 mg total) by mouth daily.  30 capsule  5  . pramoxine-hydrocortisone (PROCTOCREAM-HC) 1-1 % rectal cream Place rectally 2 (two) times daily as needed.       No current facility-administered medications on file prior to visit.    Review of Systems no fever or chills.   No nausea or vomiting.   No abdominal pain or cramping.  No diarrhea.  No vaginal complaints or problems.  Urinary urgency and dysuria as outlined.        Objective:   Physical Exam  Filed Vitals:   03/31/14 1141  BP: 120/80  Pulse: 72  Temp: 98.1 F (68.73 C)   68 year old female in no acute distress.  HEART:  Appears to be regular. LUNGS:  No crackles or wheezing audible.  Respirations even and unlabored.  ABDOMEN:  Soft, nontender.  Bowel sounds present and normal.   BACK:  Non tender.  No CVA tenderness.             Assessment & Plan:  HEALTH MAINTENANCE.  Physical 06/24/13.  Mammogram 09/26/13- BiRADS I.  Colonoscopy as outlined.   I spent 15 minutes with the patient and more than 50% of the time was spent in consultation regarding the above.

## 2014-04-02 NOTE — Telephone Encounter (Signed)
Pt notified me - saw Dr Ubaldo Glassing.  Cardiac w/up negative.  Still with the sob.  Desires pulmonary referral.  Order placed for referral.

## 2014-04-04 ENCOUNTER — Encounter: Payer: Self-pay | Admitting: Internal Medicine

## 2014-04-04 ENCOUNTER — Telehealth: Payer: Self-pay

## 2014-04-04 DIAGNOSIS — R928 Other abnormal and inconclusive findings on diagnostic imaging of breast: Secondary | ICD-10-CM

## 2014-04-04 NOTE — Telephone Encounter (Signed)
The patient is scheduled for a Uni Left DX Left Calcs follow up on 04/07/14 at the [facility]. Can this order be put in? They are hoping to have it faxed over. Thanks!

## 2014-04-04 NOTE — Telephone Encounter (Signed)
Order placed for uni mammo left (diag)

## 2014-04-07 ENCOUNTER — Ambulatory Visit: Payer: Self-pay | Admitting: Internal Medicine

## 2014-04-07 LAB — HM MAMMOGRAPHY

## 2014-04-07 IMAGING — MG MM MAMMO DIAGNOSTIC UNILATERAL*L*
4 series · 4 of 4 positions shown · non-contrast
Comparison: [DATE], [DATE], [DATE], [DATE],
additional prior mammograms dating back to [DATE]

CLINICAL DATA: 68-year-old female for follow-up of left breast
calcifications.

EXAM:
DIGITAL DIAGNOSTIC  LEFT MAMMOGRAM WITH CAD

[L ML]
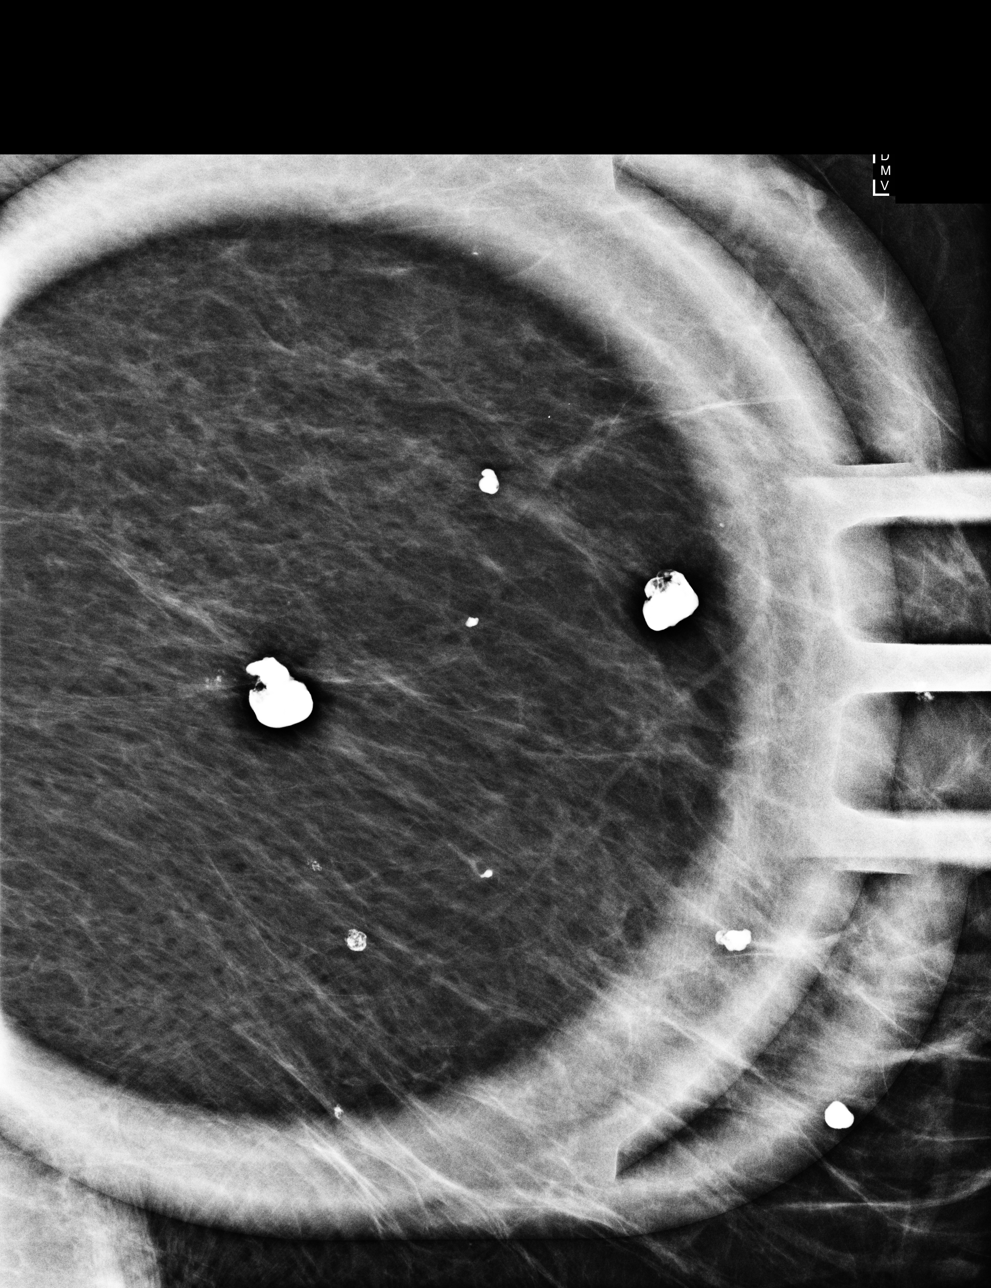

[L CC (1 of 2)]
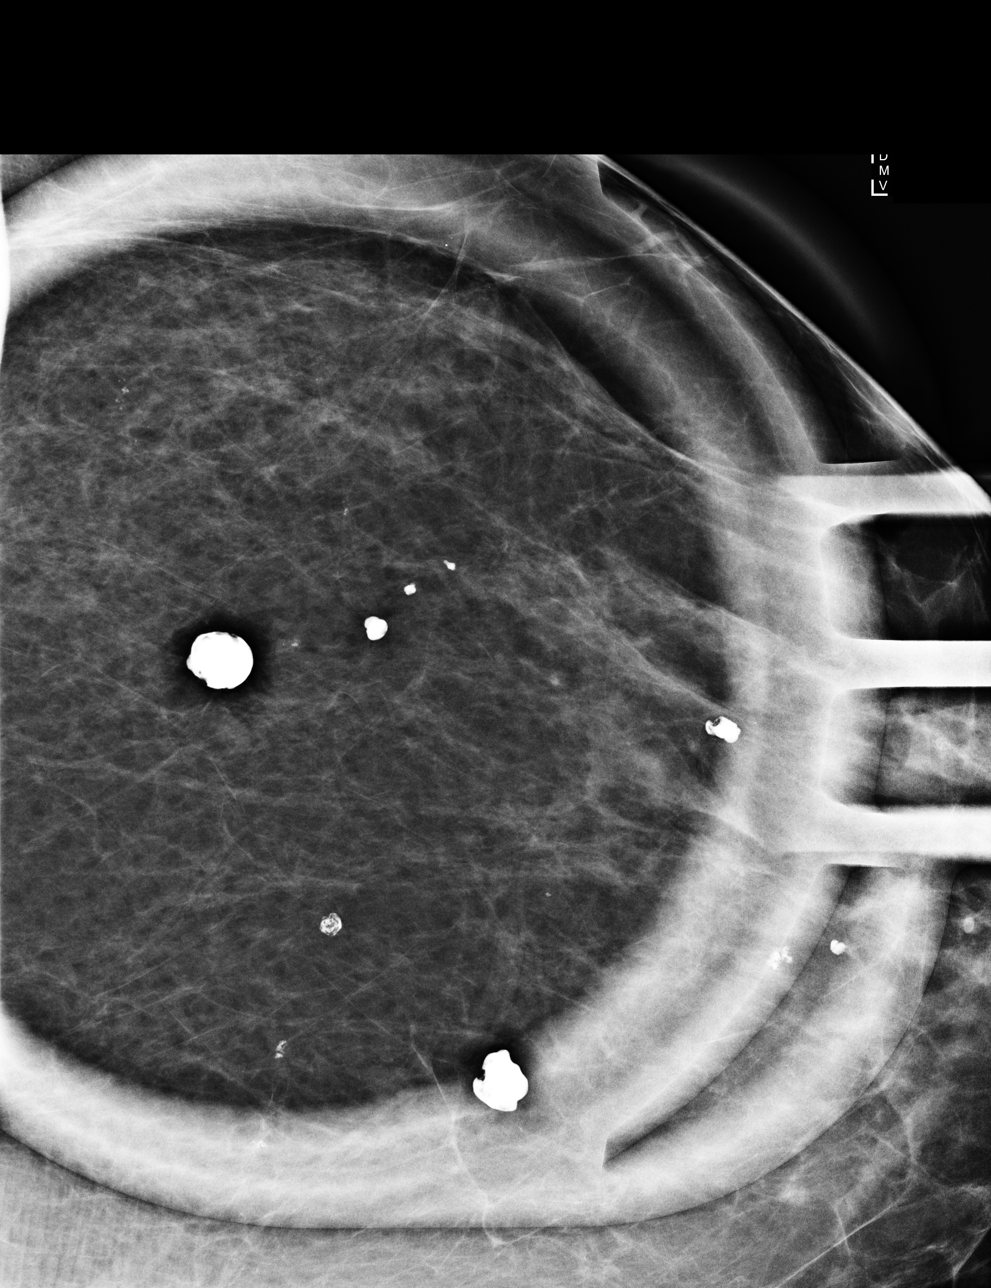

[L MLO]
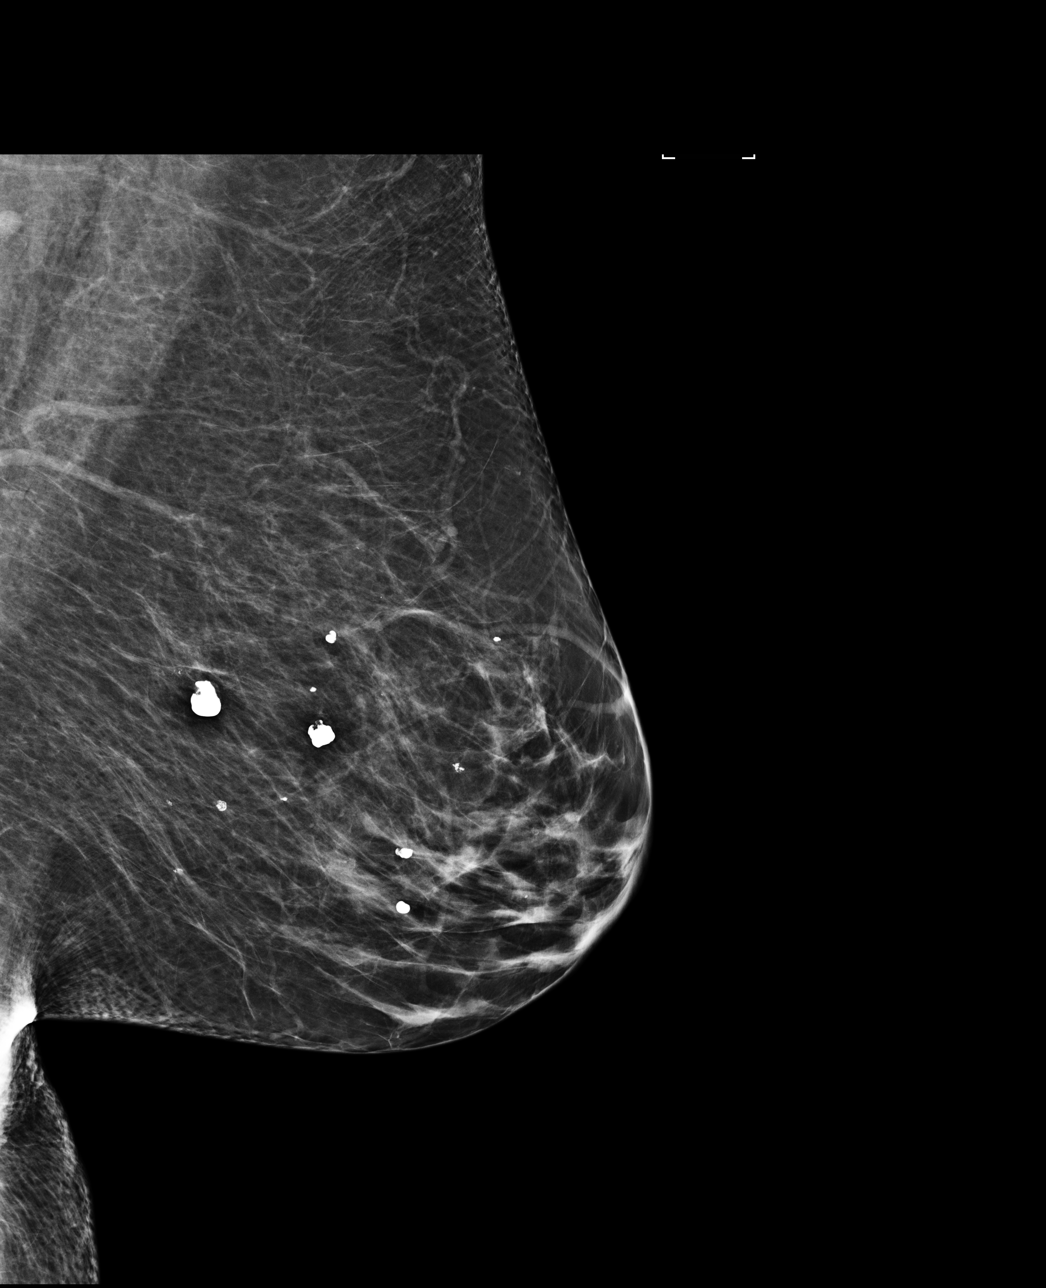

[L CC (2 of 2)]
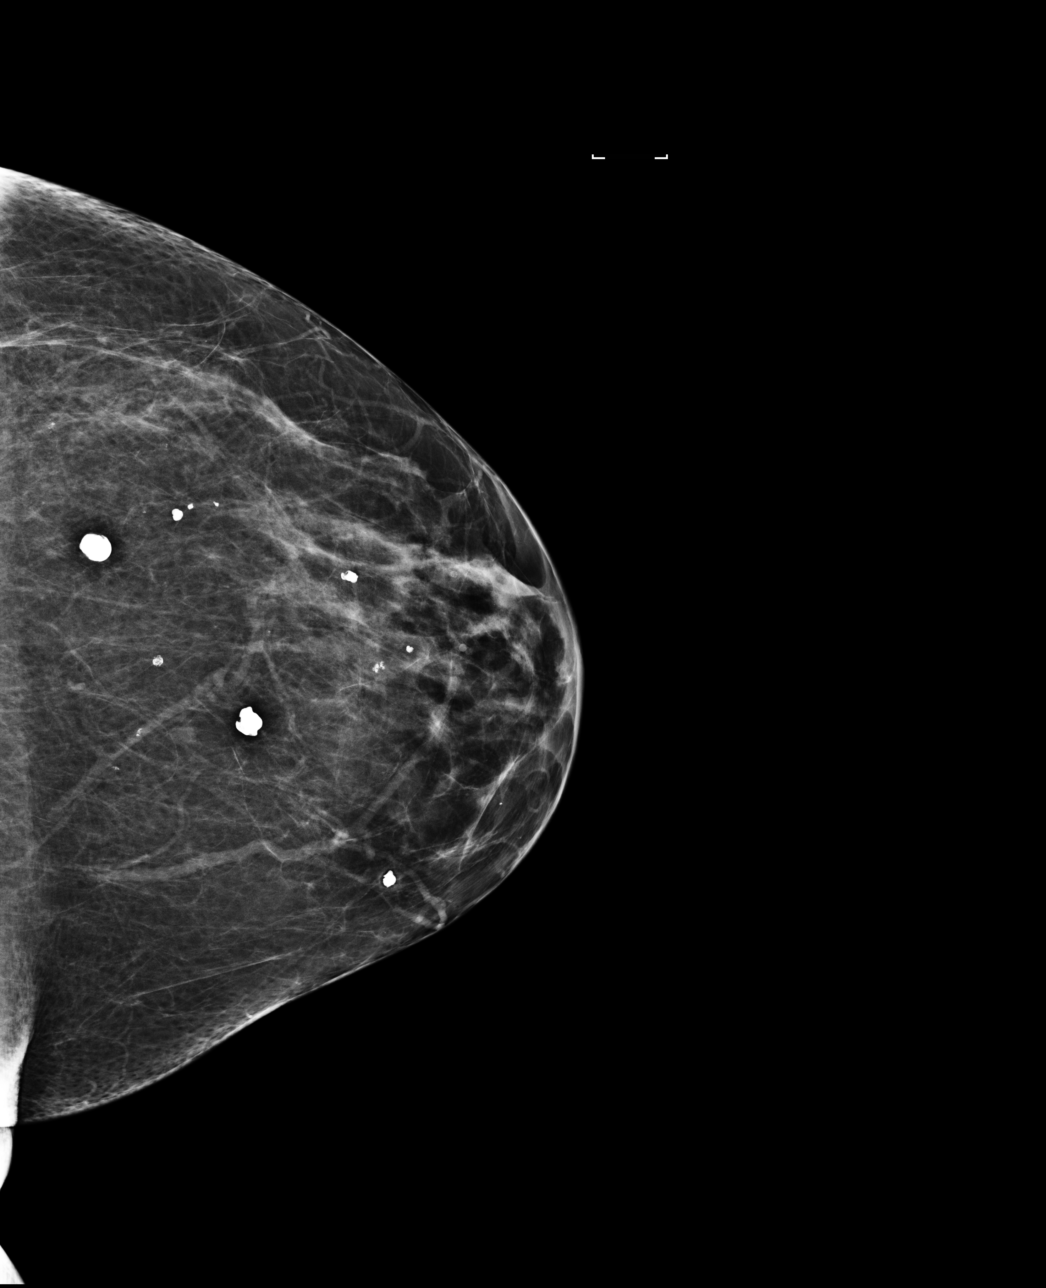

[4 of 4 positions shown; findings below may reference images not displayed]

ACR Breast Density Category b: There are scattered areas of
fibroglandular density.
FINDINGS: The previously noted 7 mm cluster of coarse calcifications within
the lateral, posterior left breast appears similar to prior study
and is again thought to most likely represent fibroadenomatoid
change. The patient has multiple calcified fibroadenomas within both
breasts. No suspicious mass or architectural distortion is
identified.

Mammographic images were processed with CAD.
IMPRESSION: Probably benign left breast calcifications.

RECOMMENDATION:
Bilateral diagnostic mammogram in 6 months.

I have discussed the findings and recommendations with the patient.
Results were also provided in writing at the conclusion of the
visit. If applicable, a reminder letter will be sent to the patient
regarding the next appointment.

BI-RADS CATEGORY  3: Probably benign finding(s) - short interval
follow-up suggested.

## 2014-04-11 ENCOUNTER — Encounter: Payer: Self-pay | Admitting: Internal Medicine

## 2014-04-12 ENCOUNTER — Encounter: Payer: Self-pay | Admitting: Internal Medicine

## 2014-04-12 DIAGNOSIS — R928 Other abnormal and inconclusive findings on diagnostic imaging of breast: Secondary | ICD-10-CM | POA: Insufficient documentation

## 2014-04-24 ENCOUNTER — Institutional Professional Consult (permissible substitution): Payer: Medicare Other | Admitting: Pulmonary Disease

## 2014-04-26 ENCOUNTER — Encounter: Payer: Self-pay | Admitting: Pulmonary Disease

## 2014-04-26 ENCOUNTER — Ambulatory Visit (INDEPENDENT_AMBULATORY_CARE_PROVIDER_SITE_OTHER): Payer: Medicare Other | Admitting: Pulmonary Disease

## 2014-04-26 VITALS — BP 134/72 | HR 71 | Ht 63.75 in | Wt 186.0 lb

## 2014-04-26 DIAGNOSIS — F17200 Nicotine dependence, unspecified, uncomplicated: Secondary | ICD-10-CM | POA: Insufficient documentation

## 2014-04-26 DIAGNOSIS — Z23 Encounter for immunization: Secondary | ICD-10-CM

## 2014-04-26 DIAGNOSIS — Z720 Tobacco use: Secondary | ICD-10-CM

## 2014-04-26 DIAGNOSIS — J449 Chronic obstructive pulmonary disease, unspecified: Secondary | ICD-10-CM | POA: Insufficient documentation

## 2014-04-26 DIAGNOSIS — J438 Other emphysema: Secondary | ICD-10-CM | POA: Insufficient documentation

## 2014-04-26 MED ORDER — IPRATROPIUM-ALBUTEROL 20-100 MCG/ACT IN AERS: 1.0000 | INHALATION_SPRAY | Freq: Four times a day (QID) | RESPIRATORY_TRACT | Status: DC | PRN

## 2014-04-26 NOTE — Assessment & Plan Note (Signed)
She smoked one pack of cigarettes daily for 30 years and quit 10 years ago. Today we had a lengthy conversation about the risks and benefits of low-dose lung cancer screening. She was advised at length on the high false positive risk and the need for followup testing. She had questions answered about this.  Plan:  -she is willing to proceed with let us cancer screening, we will make a referral.

## 2014-04-26 NOTE — Patient Instructions (Signed)
We will arrange a screening CT scan at [facility] Take combivent one puff every 6 hours as needed for shortness of breath (I would also try taking it ten minutes before exercise) We will see you back in 4-6 weeks or sooner if needed

## 2014-04-26 NOTE — Progress Notes (Signed)
Subjective:    Patient ID: Alexis Reed, female    DOB: 07-02-1946, 68 y.o.   MRN: [id number]  HPI  Alexis Reed is here to see me today for shortness of breath.  She said that she noticed it more for several years and it was a lot worse this summer with the hot weather.  She had a heart evaluation this summer because of the dyspnea and heart palpitations and she was told that she was OK from a heart perspective.  She previously smoked 1 ppd for 30 years and quit ten years ago.   She says that she wheezes when she is short of breath.  There are no clear environmental triggers.  She states that she gets dyspnea on exertion and does not have wheezing or pain when this happens.  She walks regularly every morning and she climbs hills which make her dyspnea.    She thinks that this has been going on since she quit smoking and it has progressed slightly.  No cough.  Past Medical History  Diagnosis Date  . Hypercholesterolemia   . Arthritis   . Fibroids     s/p hysterectomy  . Benign breast lumps     multiple lumps biopsy and remove x's 5 last being 1979 by Dr. Sharlet Reed  . Osteoporosis      Family History  Problem Relation Age of Onset  . Colon cancer Father   . Stroke Father   . Heart disease Father     myocardial infarction  . Breast cancer Neg Hx      History   Social History  . Marital Status: Married    Spouse Name: N/A    Number of Children: 2  . Years of Education: N/A   Occupational History  . Not on file.   Social History Main Topics  . Smoking status: Former Smoker -- 1.00 packs/day for 30 years    Types: Cigarettes    Quit date: 04/26/2004  . Smokeless tobacco: Never Used  . Alcohol Use: Yes     Comment: occassional  . Drug Use: No  . Sexual Activity: Not on file   Other Topics Concern  . Not on file   Social History Narrative   She is married, has two children. Works at [employer]     Allergies  Allergen Reactions  .  Boniva [Ibandronic Acid] Other (See Comments)    Aching   . Codeine Other (See Comments)    Upset stomach  . Fosamax [Alendronate Sodium]     Caused aching  . Penicillins     Facial swelling     Outpatient Prescriptions Prior to Visit  Medication Sig Dispense Refill  . Calcium Carbonate-Vitamin D (CALCIUM 600-D) 600-400 MG-UNIT per tablet Take 1 tablet by mouth 2 (two) times daily.      . Cholecalciferol (VITAMIN D) 2000 UNITS tablet Take 2,000 Units by mouth daily.      . ciprofloxacin (CIPRO) 500 MG tablet Take 1 tablet (500 mg total) by mouth 2 (two) times daily.  10 tablet  0  . meloxicam (MOBIC) 7.5 MG tablet Take 1 tablet (7.5 mg total) by mouth daily as needed.  30 tablet  1  . omeprazole (PRILOSEC) 20 MG capsule Take 1 capsule (20 mg total) by mouth daily.  30 capsule  5  . pramoxine-hydrocortisone (PROCTOCREAM-HC) 1-1 % rectal cream Place rectally 2 (two) times daily as needed.       No facility-administered medications prior to  visit.      Review of Systems  Constitutional: Negative for fever, chills, diaphoresis, appetite change and fatigue.  HENT: Negative for congestion, hearing loss, nosebleeds, postnasal drip, rhinorrhea, sinus pressure, sore throat and trouble swallowing.   Eyes: Negative for discharge, redness and visual disturbance.  Respiratory: Positive for shortness of breath. Negative for cough, choking, chest tightness and wheezing.   Cardiovascular: Negative for chest pain and leg swelling.  Gastrointestinal: Negative for nausea, abdominal pain, diarrhea, constipation and blood in stool.  Genitourinary: Negative for dysuria, frequency and hematuria.  Musculoskeletal: Negative for arthralgias, joint swelling, myalgias and neck stiffness.  Skin: Negative for color change, pallor and rash.  Neurological: Negative for dizziness, seizures, speech difficulty, light-headedness, numbness and headaches.  Hematological: Negative for adenopathy. Does not bruise/bleed  easily.       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 2011;155(3):179) recommends tobacco cessation, pulmonary rehab (for symptomatic patients with an FEV1 < 50% predicted), supplemental oxygen (for patients with SaO2 <88% or paO2 <55), and appropriate bronchodilator therapy.  In regards to long acting bronchodilators, they recommend monotherapy (FEV1 60-80% with symptoms weak evidence, FEV1 with symptoms <60% strong evidence), or combination therapy (FEV1 <60% with symptoms, strong recommendation, moderate evidence).  One should also provide patients with annual immunizations and consider therapy for prevention of COPD exacerbations (ie. roflumilast or azithromycin) when appopriate.  -O2 therapy: not indicated -Immunizations: Pneumovax today, prevnar next visit, flu with county this weeks -Tobacco use: quit 2005 -Exercise: Advised continue regular activity, try weights as well -Bronchodilator therapy: Combivent q6h prn dyspnea -Exacerbation prevention: N/A   Tobacco use disorder She smoked one pack of cigarettes daily for 30 years and quit 10 years ago. Today we had a lengthy conversation about the risks  and benefits of low-dose lung cancer screening. She was advised at length on the high false positive risk and the need for followup testing. She had questions answered about this.  Plan:  -she is willing to proceed with let us cancer screening, we will make a referral.   Updated Medication List Outpatient Encounter Prescriptions as of 04/26/2014  Medication Sig  . Calcium Carbonate-Vitamin D (CALCIUM 600-D) 600-400 MG-UNIT per tablet Take 1 tablet by mouth 2 (two) times daily.  . Cholecalciferol (VITAMIN D) 2000 UNITS tablet Take 2,000 Units by mouth daily.  . ciprofloxacin (CIPRO) 500 MG tablet Take 1 tablet (500 mg total) by mouth 2 (two) times daily.  . meloxicam (MOBIC) 7.5 MG tablet Take 1 tablet (7.5 mg total) by mouth daily as needed.  Marland Kitchen omeprazole (PRILOSEC) 20 MG capsule Take 1 capsule (20 mg total) by mouth daily.  . Ipratropium-Albuterol (COMBIVENT RESPIMAT) 20-100  MCG/ACT AERS respimat Inhale 1 puff into the lungs every 6 (six) hours as needed for wheezing or shortness of breath.  . [DISCONTINUED] pramoxine-hydrocortisone (PROCTOCREAM-HC) 1-1 % rectal cream Place rectally 2 (two) times daily as needed.

## 2014-04-26 NOTE — Assessment & Plan Note (Addendum)
COPD: GOLD Grade A Combined recommendations from the [employer], [employer], Investment banker, corporate, [place] (Qaseem A et al, Ann Intern Med. 2011;155(3):179) recommends tobacco cessation, pulmonary rehab (for symptomatic patients with an FEV1 < 50% predicted), supplemental oxygen (for patients with SaO2 <88% or paO2 <55), and appropriate bronchodilator therapy.  In regards to long acting bronchodilators, they recommend monotherapy (FEV1 60-80% with symptoms weak evidence, FEV1 with symptoms <60% strong evidence), or combination therapy (FEV1 <60% with symptoms, strong recommendation, moderate evidence).  One should also provide patients with annual immunizations and consider therapy for prevention of COPD exacerbations (ie. roflumilast or azithromycin) when appopriate.  -O2 therapy: not indicated -Immunizations: Pneumovax today, prevnar next visit, flu with county this weeks -Tobacco use: quit 2005 -Exercise: Advised continue regular activity, try weights as well -Bronchodilator therapy: Combivent q6h prn dyspnea -Exacerbation prevention: N/A

## 2014-05-15 ENCOUNTER — Encounter: Payer: Self-pay | Admitting: Pulmonary Disease

## 2014-05-23 ENCOUNTER — Telehealth: Payer: Self-pay | Admitting: Pulmonary Disease

## 2014-05-23 DIAGNOSIS — F17200 Nicotine dependence, unspecified, uncomplicated: Secondary | ICD-10-CM

## 2014-05-23 DIAGNOSIS — J438 Other emphysema: Secondary | ICD-10-CM

## 2014-05-23 NOTE — Telephone Encounter (Signed)
Protivin x 1 - For some reason order for CT was not placed at pt's ov on 04/26/14.  I placed order today and pt should be receiving call for [facility].

## 2014-05-24 NOTE — Telephone Encounter (Signed)
Called and spoke to pt. CT order placed and pt is aware someone will be contacting her to schedule CT. F/u appt made with BQ in [place] on 06/26/14. Pt verbalized understanding and denied any further questions or concerns at this time.

## 2014-06-14 ENCOUNTER — Ambulatory Visit: Payer: Self-pay | Admitting: Hospice and Palliative Medicine

## 2014-06-14 IMAGING — CT CT CHEST W/O CM SCREENING
2 of 4 series · 15 of 40 positions shown, 18 images · non-contrast
Comparison: None.

CLINICAL DATA: Former smoker, 30 pack-year history, quit 10 years
ago.

EXAM:
CT CHEST WITHOUT CONTRAST
TECHNIQUE: Multidetector CT imaging of the chest was performed following the
standard protocol without IV contrast..

[Series 2: routine chest wo · axial · 0.63mm/px · z∈[-628,-383]mm · 12 of 59 slices shown, 15 images]
[im 5/59  mediastinal]
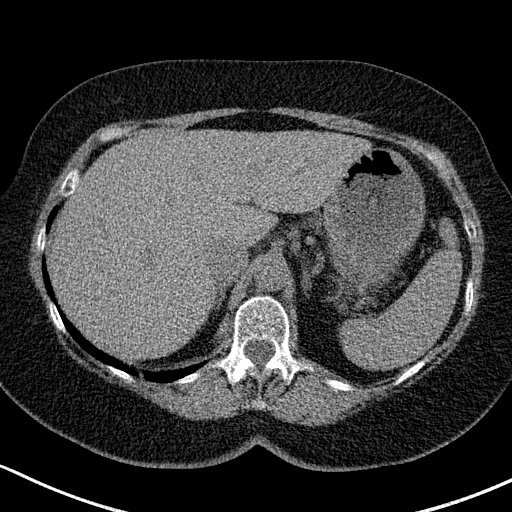
[im 5/59  lung]
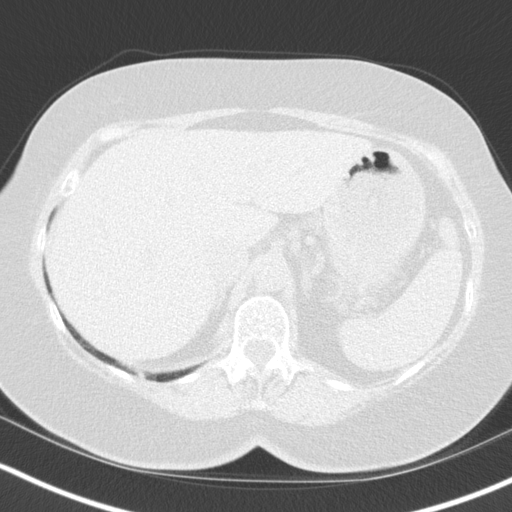
[im 9/59  lung]
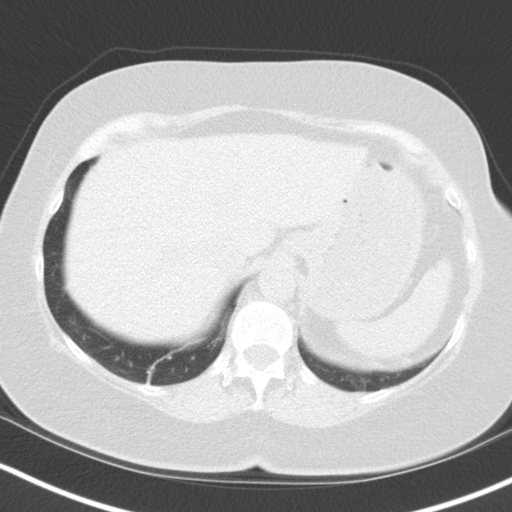
[im 14/59  lung]
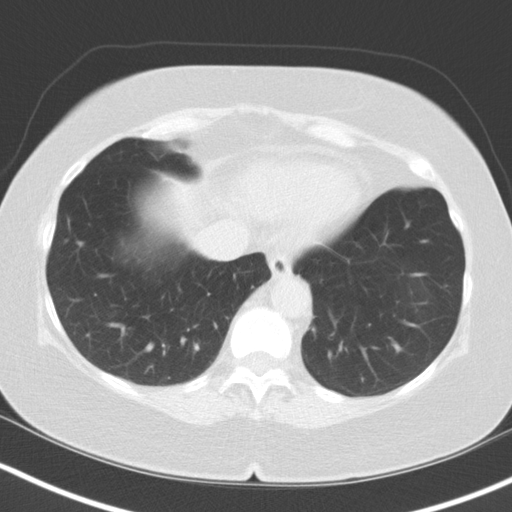
[im 18/59  lung]
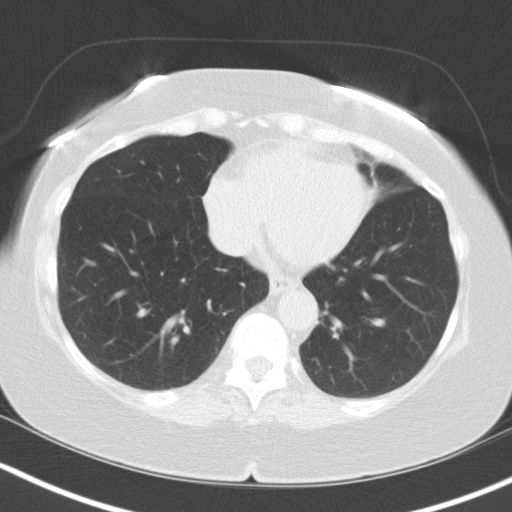
[im 23/59  mediastinal]
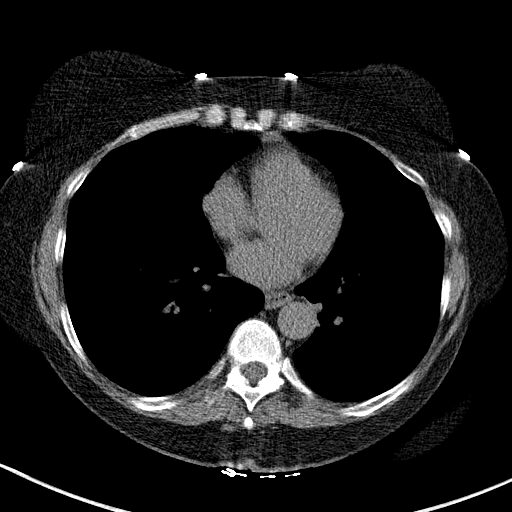
[im 23/59  lung]
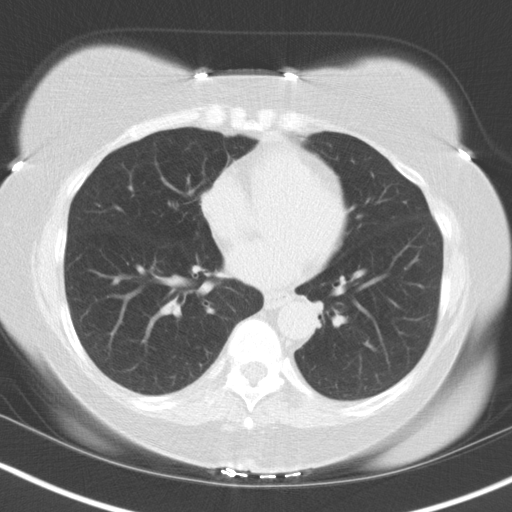
[im 27/59  lung]
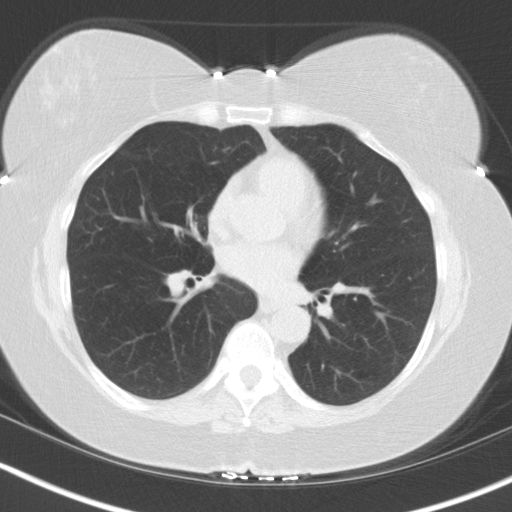
[im 32/59  lung]
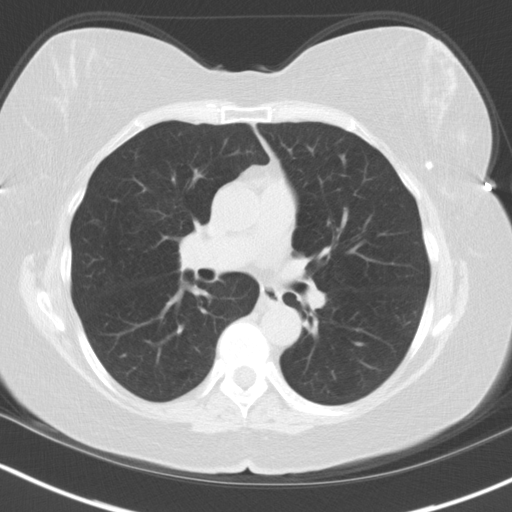
[im 36/59  lung]
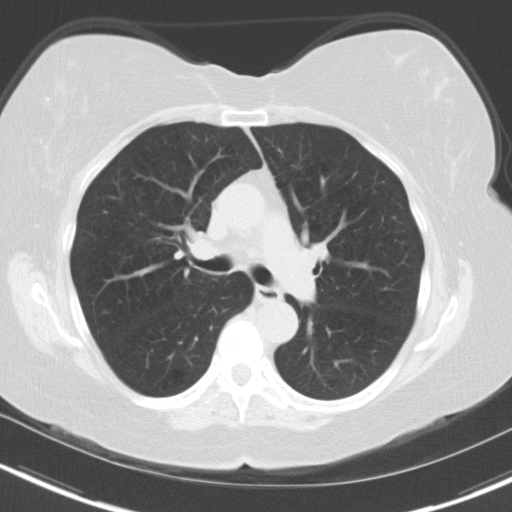
[im 41/59  mediastinal]
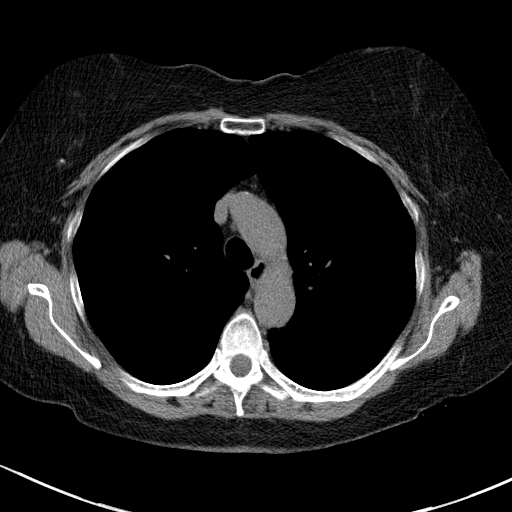
[im 41/59  lung]
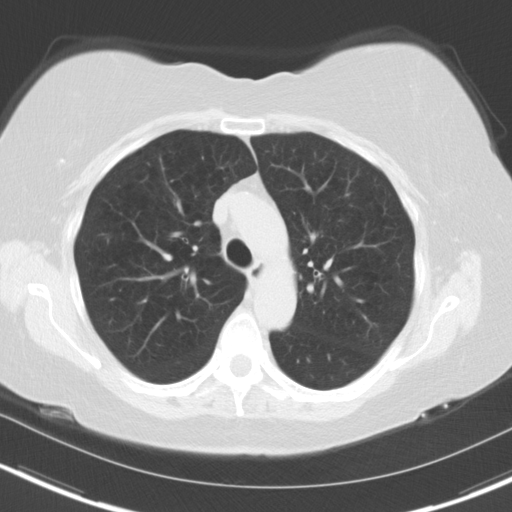
[im 45/59  lung]
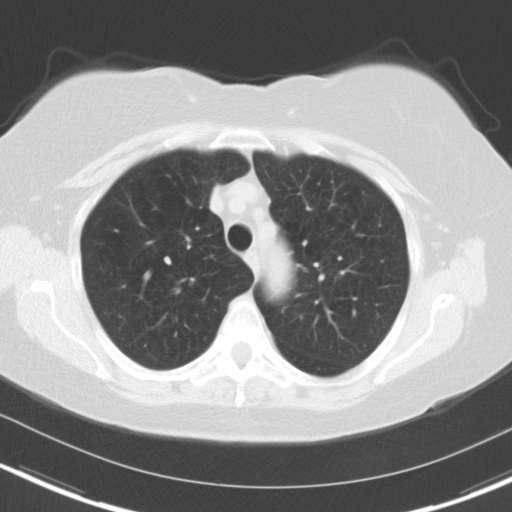
[im 50/59  lung]
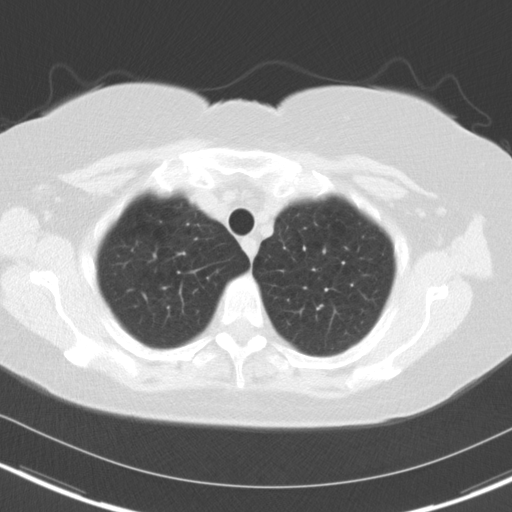
[im 54/59  lung]
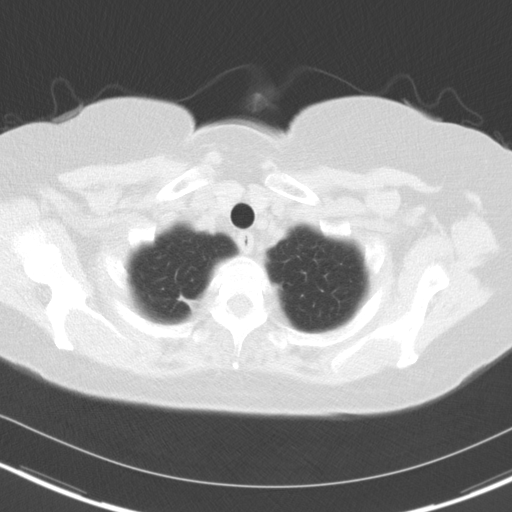

[Series 5: routine chest wo cor · coronal · 0.59mm/px · 3 of 294 slices shown]
[im 59/294  lung]
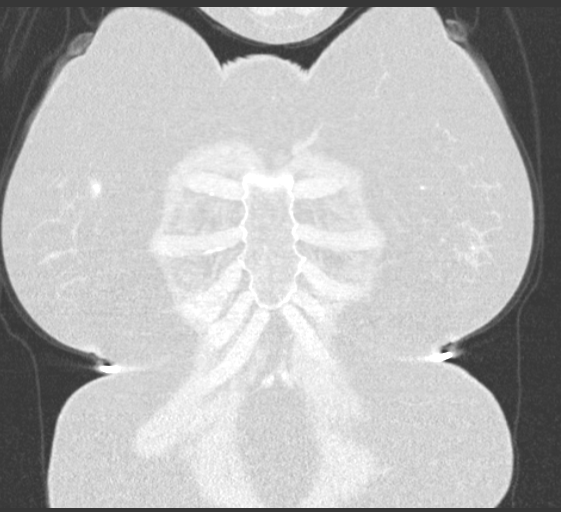
[im 118/294  lung]
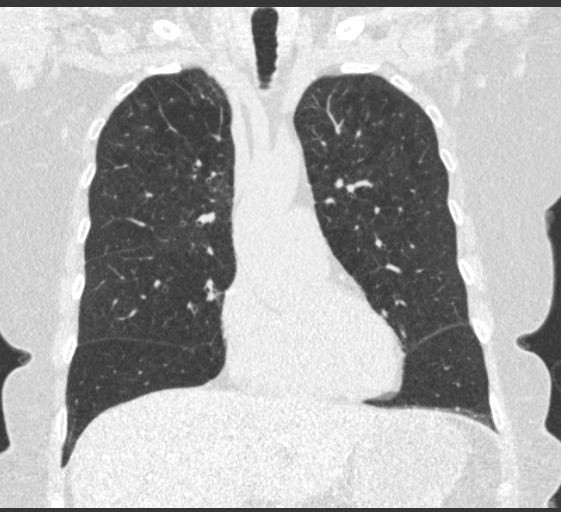
[im 176/294  lung]
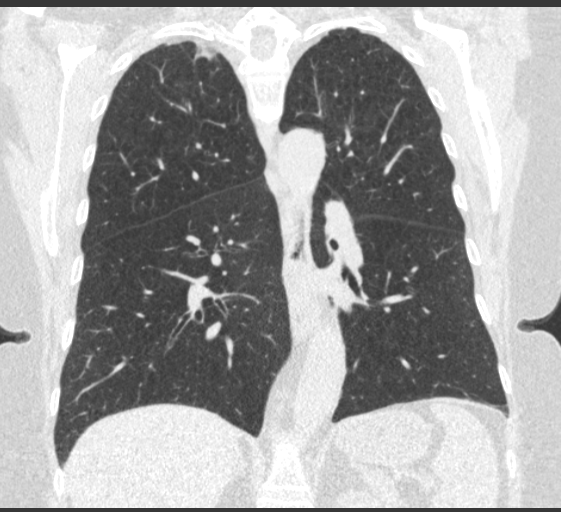

[15 of 40 positions shown; findings below may reference images not displayed]

FINDINGS: No pathologically enlarged mediastinal or axillary lymph nodes.
Hilar regions are difficult to definitively evaluate without IV
contrast appear grossly unremarkable. Heart size normal. No
pericardial effusion.

Biapical pleural parenchymal scarring. Centrilobular emphysema. 2 mm
left upper lobe nodule (image 57). Scattered pulmonary parenchymal
scarring is mild. No pleural fluid. Airway is unremarkable.

Incidental imaging of the upper abdomen shows the visualized
portions of the liver, adrenal glands, left kidney, spleen, pancreas
and stomach to be grossly unremarkable.

No worrisome lytic or sclerotic lesions.
IMPRESSION: Lung-RADS Category 2, benign appearance or behavior. Continue annual
screening with low-dose chest CT without contrast in 12 months.

## 2014-06-26 ENCOUNTER — Ambulatory Visit (INDEPENDENT_AMBULATORY_CARE_PROVIDER_SITE_OTHER): Payer: Medicare Other | Admitting: Pulmonary Disease

## 2014-06-26 ENCOUNTER — Encounter: Payer: Self-pay | Admitting: Pulmonary Disease

## 2014-06-26 VITALS — BP 94/68 | HR 96 | Temp 98.0°F | Ht 63.0 in | Wt 188.4 lb

## 2014-06-26 DIAGNOSIS — J438 Other emphysema: Secondary | ICD-10-CM

## 2014-06-26 DIAGNOSIS — F17200 Nicotine dependence, unspecified, uncomplicated: Secondary | ICD-10-CM

## 2014-06-26 DIAGNOSIS — Z720 Tobacco use: Secondary | ICD-10-CM

## 2014-06-26 NOTE — Progress Notes (Signed)
   Subjective:    Patient ID: Alexis Reed, female    DOB: [birth year], 68 y.o.   MRN: [id number]  Synopsis: Gold Grade A COPD, smoked 1 ppd for 30 years, quit 2005  HPI  Chief Complaint  Patient presents with  . Follow-up    Patient denies any pulmonary symptoms today. She has used her Combivent inhaler a couple of times. She can tell a difference when she uses it. She does see sob with exhertionl   Alexis Reed has been doing fine since the last visit. She has minimal shortness of breath. She does use her Combivent inhaler from time to time and she says that it helps. 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 She says that this has helped. She has no cough. She has no chest tightness. She recently had her low-dose lung cancer screening CT which she said was normal. I have yet to receive results of this.   Past Medical History  Diagnosis Date  . Hypercholesterolemia   . Arthritis   . Fibroids     s/p hysterectomy  . Benign breast lumps     multiple lumps biopsy and remove x's 5 last being 1979 by Dr. Sharlet Salina  . 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 This is a stable interval for her.  Her flu shot is up-to-date  Plan: -Continue Combivent as needed -Annual visits  Tobacco use disorder Apparently she recently had the low-dose screening CT. Unfortunately this has not been  forwarded my way yet. I will request these results.    Updated Medication List Outpatient Encounter Prescriptions as of 06/26/2014  Medication Sig  . B Complex Vitamins (VITAMIN B COMPLEX PO) Take 1 tablet by mouth daily.  . Calcium Carbonate-Vitamin D (CALCIUM 600-D) 600-400 MG-UNIT per tablet Take 1 tablet by mouth 2 (two) times daily.  . Cholecalciferol (VITAMIN D) 2000 UNITS tablet Take 2,000 Units by mouth daily.  . Ipratropium-Albuterol (COMBIVENT RESPIMAT) 20-100 MCG/ACT AERS respimat Inhale 1 puff into the lungs every 6 (six) hours as needed for wheezing or shortness of breath.  . meloxicam (MOBIC) 7.5 MG tablet Take 1 tablet (7.5 mg total) by mouth daily as needed.  Marland Kitchen omeprazole (PRILOSEC) 20 MG capsule Take 1 capsule (20 mg total) by mouth daily.  . [DISCONTINUED] ciprofloxacin (CIPRO) 500 MG tablet Take 1 tablet (500 mg total) by mouth 2 (two) times daily. (Patient not taking: Reported on 06/26/2014)

## 2014-06-26 NOTE — Assessment & Plan Note (Signed)
Apparently she recently had the low-dose screening CT. Unfortunately this has not been forwarded my way yet. I will request these results.

## 2014-06-26 NOTE — Assessment & Plan Note (Signed)
Azha has been doing fine. This is a stable interval for her.  Her flu shot is up-to-date  Plan: -Continue Combivent as needed -Annual visits

## 2014-06-26 NOTE — Patient Instructions (Signed)
Keep taking your combivent on an as needed basis as you are doing We will see you back in one year or sooner if needed

## 2014-07-11 ENCOUNTER — Other Ambulatory Visit: Payer: Self-pay

## 2014-07-11 MED ORDER — OMEPRAZOLE 20 MG PO CPDR: 20.0000 mg | DELAYED_RELEASE_CAPSULE | Freq: Every day | ORAL | Status: DC

## 2014-07-14 HISTORY — PX: BREAST BIOPSY: SHX20

## 2014-07-17 ENCOUNTER — Other Ambulatory Visit (INDEPENDENT_AMBULATORY_CARE_PROVIDER_SITE_OTHER): Payer: Medicare Other

## 2014-07-17 ENCOUNTER — Encounter: Payer: Self-pay | Admitting: Internal Medicine

## 2014-07-17 DIAGNOSIS — M810 Age-related osteoporosis without current pathological fracture: Secondary | ICD-10-CM

## 2014-07-17 DIAGNOSIS — E7800 Pure hypercholesterolemia, unspecified: Secondary | ICD-10-CM

## 2014-07-17 LAB — LIPID PANEL
Cholesterol: 267 mg/dL — ABNORMAL HIGH (ref 0–200)
HDL: 50.4 mg/dL (ref >39.00)
LDL Cholesterol: 177 mg/dL — ABNORMAL HIGH (ref 0–99)
NonHDL: 216.6
Total CHOL/HDL Ratio: 5
Triglycerides: 200 mg/dL — ABNORMAL HIGH (ref 0.0–149.0)
VLDL: 40 mg/dL (ref 0.0–40.0)

## 2014-07-17 LAB — CBC WITH DIFFERENTIAL/PLATELET
Basophils Absolute: 0 10*3/uL (ref 0.0–0.1)
Basophils Relative: 0.4 % (ref 0.0–3.0)
Eosinophils Absolute: 0.2 10*3/uL (ref 0.0–0.7)
Eosinophils Relative: 3.5 % (ref 0.0–5.0)
HCT: 44.5 % (ref 36.0–46.0)
Hemoglobin: 14.7 g/dL (ref 12.0–15.0)
Lymphocytes Relative: 28 % (ref 12.0–46.0)
Lymphs Abs: 1.9 10*3/uL (ref 0.7–4.0)
MCHC: 32.9 g/dL (ref 30.0–36.0)
MCV: 91.2 fl (ref 78.0–100.0)
Monocytes Absolute: 0.5 10*3/uL (ref 0.1–1.0)
Monocytes Relative: 7.8 % (ref 3.0–12.0)
Neutro Abs: 4 10*3/uL (ref 1.4–7.7)
Neutrophils Relative %: 60.3 % (ref 43.0–77.0)
Platelets: 281 10*3/uL (ref 150.0–400.0)
RBC: 4.88 Mil/uL (ref 3.87–5.11)
RDW: 13.9 % (ref 11.5–15.5)
WBC: 6.6 10*3/uL (ref 4.0–10.5)

## 2014-07-17 LAB — COMPREHENSIVE METABOLIC PANEL
ALT: 16 U/L (ref 0–35)
AST: 18 U/L (ref 0–37)
Albumin: 4.1 g/dL (ref 3.5–5.2)
Alkaline Phosphatase: 80 U/L (ref 39–117)
BUN: 12 mg/dL (ref 6–23)
CO2: 26 mEq/L (ref 19–32)
Calcium: 9.4 mg/dL (ref 8.4–10.5)
Chloride: 105 mEq/L (ref 96–112)
Creatinine, Ser: 0.6 mg/dL (ref 0.4–1.2)
GFR: 117.4 mL/min (ref >60.00)
Glucose, Bld: 93 mg/dL (ref 70–99)
Potassium: 4.3 mEq/L (ref 3.5–5.1)
Sodium: 140 mEq/L (ref 135–145)
Total Bilirubin: 0.6 mg/dL (ref 0.2–1.2)
Total Protein: 7.1 g/dL (ref 6.0–8.3)

## 2014-07-17 LAB — TSH: TSH: 1.96 u[IU]/mL (ref 0.35–4.50)

## 2014-07-19 ENCOUNTER — Encounter: Payer: Self-pay | Admitting: Internal Medicine

## 2014-07-19 ENCOUNTER — Ambulatory Visit (INDEPENDENT_AMBULATORY_CARE_PROVIDER_SITE_OTHER): Payer: Medicare Other | Admitting: Internal Medicine

## 2014-07-19 VITALS — BP 102/70 | HR 76 | Temp 97.7°F | Ht 63.5 in | Wt 187.8 lb

## 2014-07-19 DIAGNOSIS — R928 Other abnormal and inconclusive findings on diagnostic imaging of breast: Secondary | ICD-10-CM

## 2014-07-19 DIAGNOSIS — Z658 Other specified problems related to psychosocial circumstances: Secondary | ICD-10-CM

## 2014-07-19 DIAGNOSIS — E7800 Pure hypercholesterolemia, unspecified: Secondary | ICD-10-CM

## 2014-07-19 DIAGNOSIS — N644 Mastodynia: Secondary | ICD-10-CM

## 2014-07-19 DIAGNOSIS — F439 Reaction to severe stress, unspecified: Secondary | ICD-10-CM

## 2014-07-19 DIAGNOSIS — J438 Other emphysema: Secondary | ICD-10-CM

## 2014-07-19 DIAGNOSIS — K219 Gastro-esophageal reflux disease without esophagitis: Secondary | ICD-10-CM

## 2014-07-19 DIAGNOSIS — Z720 Tobacco use: Secondary | ICD-10-CM

## 2014-07-19 DIAGNOSIS — F17200 Nicotine dependence, unspecified, uncomplicated: Secondary | ICD-10-CM

## 2014-07-19 DIAGNOSIS — Z1211 Encounter for screening for malignant neoplasm of colon: Secondary | ICD-10-CM

## 2014-07-19 DIAGNOSIS — E669 Obesity, unspecified: Secondary | ICD-10-CM

## 2014-07-19 MED ORDER — OMEPRAZOLE 20 MG PO CPDR: 20.0000 mg | DELAYED_RELEASE_CAPSULE | Freq: Every day | ORAL | Status: DC

## 2014-07-19 MED ORDER — ATORVASTATIN CALCIUM 10 MG PO TABS: 10.0000 mg | ORAL_TABLET | Freq: Every day | ORAL | Status: DC

## 2014-07-19 NOTE — Progress Notes (Signed)
Subjective:    Patient ID: Alexis Reed, female    DOB: Apr 11, 1946, 69 y.o.   MRN: [id number]  HPI 69 year old female with past history of hypercholesterolemia who comes in today to follow up on her cholesterol as well as for a complete physical exam.   States she is doing well.  Stays active.  No cardiac symptoms with increased activity or exertion.  Breathing stable.  Cough better.  Eating and drinking well.  Bowels stable.    Previous smoking history.  Evaluated by Dr Lake Bells.  CT ok.  No wheezing.  Increased stress with her mother's medical issues.  Feels she is handling things well.  Cholesterol elevated.  Increased from the last check.  Discussed treatment options, diet and exercise.         Past Medical History  Diagnosis Date  . Hypercholesterolemia   . Arthritis   . Fibroids     s/p hysterectomy  . Benign breast lumps     multiple lumps biopsy and remove x's 5 last being 1979 by Dr. Sharlet Salina  . Osteoporosis     Current Outpatient Prescriptions on File Prior to Visit  Medication Sig Dispense Refill  . B Complex Vitamins (VITAMIN B COMPLEX PO) Take 1 tablet by mouth daily.    . Calcium Carbonate-Vitamin D (CALCIUM 600-D) 600-400 MG-UNIT per tablet Take 1 tablet by mouth 2 (two) times daily.    . Cholecalciferol (VITAMIN D) 2000 UNITS tablet Take 2,000 Units by mouth daily.    . Ipratropium-Albuterol (COMBIVENT RESPIMAT) 20-100 MCG/ACT AERS respimat Inhale 1 puff into the lungs every 6 (six) hours as needed for wheezing or shortness of breath. 4 g 5  . meloxicam (MOBIC) 7.5 MG tablet Take 1 tablet (7.5 mg total) by mouth daily as needed. 30 tablet 1  . omeprazole (PRILOSEC) 20 MG capsule Take 1 capsule (20 mg total) by mouth daily. 30 capsule 5   No current facility-administered medications on file prior to visit.    Review of Systems Patient denies any headache, lightheadedness or dizziness.  No sinus or allergy symptoms.  No chest pain, tightness or palpitations. No  increased shortness of breath or congestion.  No cough.  No nausea or vomiting.  No acid reflux, dysphagia or odynophagia.  Prilosec controls her symptoms.  No abdominal pain or cramping.  Bowels stable.  Cholesterol elevated.  Increased stress as outlined.  Does report pain in left breast.  Present now for a while.        Objective:   Physical Exam  Filed Vitals:   07/19/14 1038  BP: 102/70  Pulse: 76  Temp: 97.7 F (36.5 C)   Blood pressure recheck:  19/62  69 year old female in no acute distress.   HEENT:  Nares- clear.  Oropharynx - without lesions. NECK:  Supple.  Nontender.  No audible bruit.  HEART:  Appears to be regular. LUNGS:  No crackles or wheezing audible.  Respirations even and unlabored.  RADIAL PULSE:  Equal bilaterally.    BREASTS:  No nipple discharge or nipple retraction present.  Could not appreciate any distinct nodules or axillary adenopathy.  Minimal pain - left breast (2-3:00).   ABDOMEN:  Soft, nontender.  Bowel sounds present and normal.  No audible abdominal bruit.  GU:  Not performed.     EXTREMITIES:  No increased edema present.  DP pulses palpable and equal bilaterally.          Assessment & Plan:  1. Hypercholesterolemia Low  cholesterol diet and exercise.  LDL increased more on this last check.  Discussed starting a statin medication.  Start lipitor 10mg  q day.  Follow cholesterol and liver function tests.   - Hepatic function panel; Future  2. Abnormal mammogram See below.  Left breast pain.  Birads III mammogram.   - Ambulatory referral to General Surgery  3. Breast pain, left See above.   - Ambulatory referral to General Surgery  4. Obesity (BMI 30-39.9) Diet and exercise.    5. Gastroesophageal reflux disease, esophagitis presence not specified Controlled.    6. Stress Increased stress with her mother's medical issues.  Feels she is coping well.  Follow.    7. Tobacco use disorder Has quit smoking.  Recent CT ok.    8. GOLD  GRADE A COPD with Centrilobular Emphysema Breathing stable.  Continue combivent prn.    9. Colon cancer screening Refer to GI for colonoscopy.   - Ambulatory referral to Gastroenterology  10. THYROID FULLNESS.  Has been worked up.  Follow TSH.   11. GI.  Colonoscopy 09/22/08 revealed internal hemorrhoids.  Recommended repeat in five years.  States needs referral.  Upper symptoms controlled on Omeprazole.    12. PREVIOUS ABNORMAL BREAST EXAM.  Bilateral mammogram 09/20/13 - recommended f/u views.  Left breast mammogram 09/26/13 - BiRADS III.   Left breast mammogram 04/07/14 Birads III.  Would be due for bilateral mammogram in March.  With pain and mammogram as outlined, will refer to Dr Bary Castilla for further evaluation.      13. CARDIOVASCULAR.  Stress test 03/08/10 negative for ischemia.  Currently asymptomatic.    14. PULMONARY.  PFTs revealed mild obstruction and moderately decreased diffusion capacity.  Findings c/w COPD.  CT chest 04/15/10 negative.  Had low dose CT recently.  Negative.  Saw Dr Lake Bells.   Cough better.  Breathing stable.    HEALTH MAINTENANCE.  Physical today.  Mammogram as outlined.  .  Colonoscopy as outlined.   I spent 25 minutes with the patient and more than 50% of the time was spent in consultation regarding the above.

## 2014-07-19 NOTE — Progress Notes (Signed)
Pre visit review using our clinic review tool, if applicable. No additional management support is needed unless otherwise documented below in the visit note. 

## 2014-07-21 ENCOUNTER — Encounter: Payer: Self-pay | Admitting: General Surgery

## 2014-07-23 ENCOUNTER — Encounter: Payer: Self-pay | Admitting: Internal Medicine

## 2014-07-23 DIAGNOSIS — E669 Obesity, unspecified: Secondary | ICD-10-CM | POA: Insufficient documentation

## 2014-07-31 ENCOUNTER — Ambulatory Visit: Payer: Medicare Other | Admitting: General Surgery

## 2014-08-10 ENCOUNTER — Ambulatory Visit: Payer: Medicare Other | Admitting: General Surgery

## 2014-08-16 ENCOUNTER — Encounter: Payer: Self-pay | Admitting: General Surgery

## 2014-08-16 ENCOUNTER — Ambulatory Visit (INDEPENDENT_AMBULATORY_CARE_PROVIDER_SITE_OTHER): Payer: Medicare Other | Admitting: General Surgery

## 2014-08-16 VITALS — BP 120/82 | HR 76 | Resp 12 | Ht 63.0 in | Wt 186.0 lb

## 2014-08-16 DIAGNOSIS — R0789 Other chest pain: Secondary | ICD-10-CM

## 2014-08-16 NOTE — Patient Instructions (Signed)
Patient advised she can use a heating pad to help with pain relief. The patient is aware to call back for any questions or concerns.

## 2014-08-16 NOTE — Progress Notes (Signed)
Patient ID: Alexis Reed, female   DOB: [birth year], 69 y.o.   MRN: [id number]  Chief Complaint  Patient presents with  . Other    mammogram    HPI [name] is a 69 y.o. female who presents for a breast evaluation. The most recent mammogram was done on 04/07/14. Patient does perform regular self breast checks and gets regular mammograms done.  She noticed some left breast pain in November-December 2015. The pain would come and goes. She described the pain as a shooting pain as well as a dull ache. The pain has gotten better but still occasional comes. The pain is located in the outer aspect of the left breast. In March 2015 calcifications were noted on her mammogram. Her repeat mammogram was on 04/07/14 and the calcifications had not changed.   HPI  Past Medical History  Diagnosis Date  . Hypercholesterolemia   . Arthritis   . Fibroids     s/p hysterectomy  . Benign breast lumps     multiple lumps biopsy and remove x's 5 last being 1979 by Dr. Sharlet Salina  . Osteoporosis     Past Surgical History  Procedure Laterality Date  . Abdominal hysterectomy  1997    with bilateral oophorectomy  . Tonsillectomy    . Breast biopsy Right 1975    multiple biopsies done  . Breast biopsy Left     multiple done  . Breast surgery Left     lump removed. Unsure of date  . Breast surgery Right     lump removed. Unsure of date  . Colonoscopy  2010    Dr. Vira Agar    Family History  Problem Relation Age of Onset  . Colon cancer Father   . Stroke Father   . Heart disease Father     myocardial infarction  . Breast cancer Neg Hx     Social History History  Substance Use Topics  . Smoking status: Former Smoker -- 1.00 packs/day for 30 years    Types: Cigarettes    Quit date: 04/26/2004  . Smokeless tobacco: Never Used  . Alcohol Use: 0.0 oz/week    0 Not specified per week     Comment: occassional    Allergies  Allergen Reactions  . Boniva [Ibandronic Acid] Other (See  Comments)    Aching   . Codeine Other (See Comments)    Upset stomach  . Fosamax [Alendronate Sodium]     Caused aching  . Penicillins     Facial swelling    Current Outpatient Prescriptions  Medication Sig Dispense Refill  . atorvastatin (LIPITOR) 10 MG tablet Take 1 tablet (10 mg total) by mouth daily. 30 tablet 2  . B Complex Vitamins (VITAMIN B COMPLEX PO) Take 1 tablet by mouth daily.    . Calcium Carbonate-Vitamin D (CALCIUM 600-D) 600-400 MG-UNIT per tablet Take 1 tablet by mouth 2 (two) times daily.    . Cholecalciferol (VITAMIN D) 2000 UNITS tablet Take 2,000 Units by mouth daily.    . Ipratropium-Albuterol (COMBIVENT RESPIMAT) 20-100 MCG/ACT AERS respimat Inhale 1 puff into the lungs every 6 (six) hours as needed for wheezing or shortness of breath. 4 g 5  . meloxicam (MOBIC) 7.5 MG tablet Take 1 tablet (7.5 mg total) by mouth daily as needed. 30 tablet 1  . omeprazole (PRILOSEC) 20 MG capsule Take 1 capsule (20 mg total) by mouth daily. 30 capsule 5   No current facility-administered medications for this visit.    Review  of Systems Review of Systems  Constitutional: Negative.   Respiratory: Negative.   Cardiovascular: Negative.     Blood pressure 120/82, pulse 76, resp. rate 12, height 5\' 3"  (1.6 m), weight 186 lb (84.369 kg), last menstrual period 07/20/1995.  Physical Exam Physical Exam  Constitutional: She is oriented to person, place, and time. She appears well-developed and well-nourished.  Neck: Neck supple. No thyromegaly present.  Cardiovascular: Normal rate, regular rhythm and normal heart sounds.   Pulmonary/Chest: Effort normal and breath sounds normal. Right breast exhibits no inverted nipple, no mass, no nipple discharge, no skin change and no tenderness. Left breast exhibits no inverted nipple, no mass, no nipple discharge, no skin change and no tenderness.    Well healed incision right breast 6 o'clock.   Well healed incision left breast 2  o'clock.     Lymphadenopathy:    She has no cervical adenopathy.    She has no axillary adenopathy.  Neurological: She is alert and oriented to person, place, and time.  Skin: Skin is warm and dry.  Vitals reviewed.   Data Reviewed 2013 through 2015 mammograms were reviewed independently.  Assessment    Chest wall tenderness.  Benign breast exam.    Plan    The patient was encouraged to make use of local heat for comfort.  Anti-inflammatories if needed.  The patient will continue annual breast exam and MRV with Dr. Nicki Reaper.     PCP:  Nash Shearer 08/17/2014, 5:24 PM

## 2014-08-17 DIAGNOSIS — R0789 Other chest pain: Secondary | ICD-10-CM | POA: Insufficient documentation

## 2014-08-28 ENCOUNTER — Telehealth: Payer: Self-pay | Admitting: Internal Medicine

## 2014-08-28 DIAGNOSIS — R928 Other abnormal and inconclusive findings on diagnostic imaging of breast: Secondary | ICD-10-CM

## 2014-08-28 NOTE — Telephone Encounter (Signed)
Saw Dr Bary Castilla.  Recommended f/u mammogram.  Diagnostic mammogram ordered for 09/2014

## 2014-08-29 MED ORDER — OSELTAMIVIR PHOSPHATE 75 MG PO CAPS: 75.0000 mg | ORAL_CAPSULE | Freq: Two times a day (BID) | ORAL | Status: DC

## 2014-08-29 NOTE — Telephone Encounter (Signed)
rx sent in for tamiflu.  

## 2014-08-30 ENCOUNTER — Other Ambulatory Visit: Payer: Medicare Other

## 2014-10-09 ENCOUNTER — Ambulatory Visit: Payer: Self-pay | Admitting: Internal Medicine

## 2014-10-09 LAB — HM MAMMOGRAPHY

## 2014-10-09 IMAGING — MG MM DIGITAL DIAGNOSTIC BILAT W/ TOMO W/ CAD
8 of 19 series · 8 of 35 positions shown · non-contrast
Comparison: [DATE], [DATE], [DATE], [DATE],
[DATE], [DATE].

CLINICAL DATA: Patient returns for 6 month follow-up evaluation of
probably benign left breast calcifications and annual re-evaluation
of the right breast.

EXAM:
DIGITAL DIAGNOSTIC bilateral MAMMOGRAM WITH 3D TOMOSYNTHESIS AND CAD

[L ML]
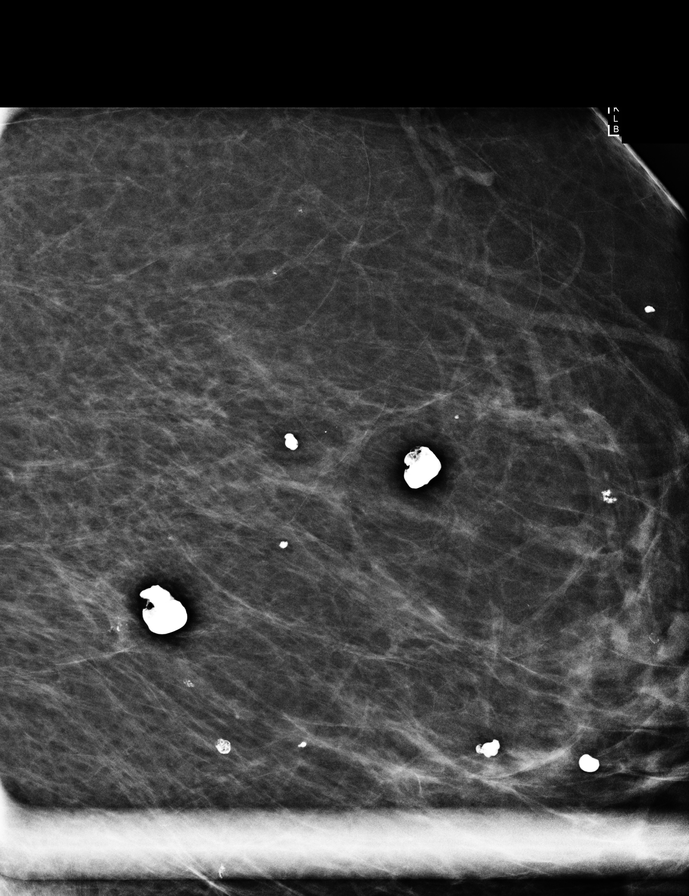

[R ML]
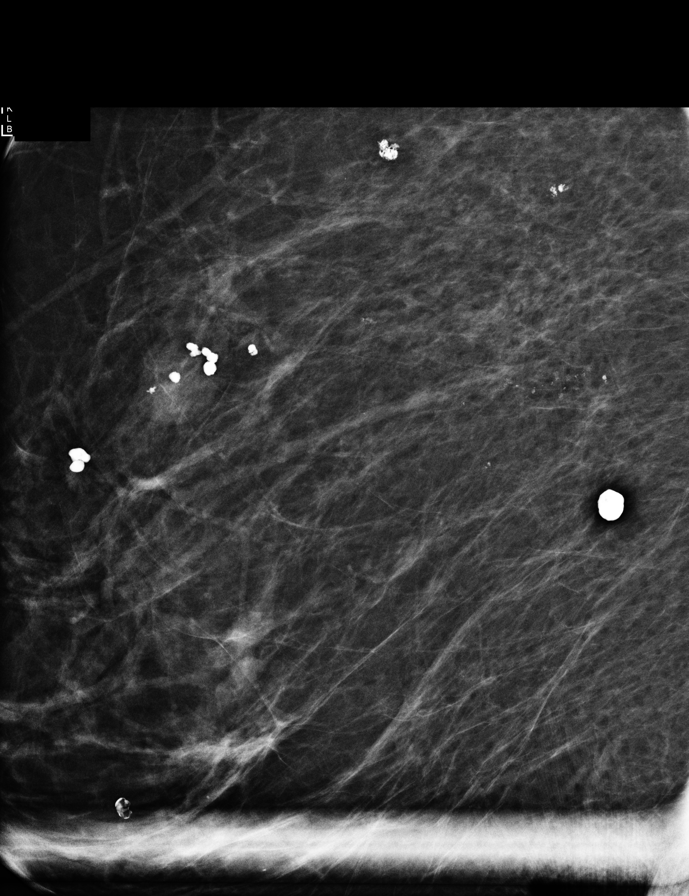

[L CC (1 of 2)]
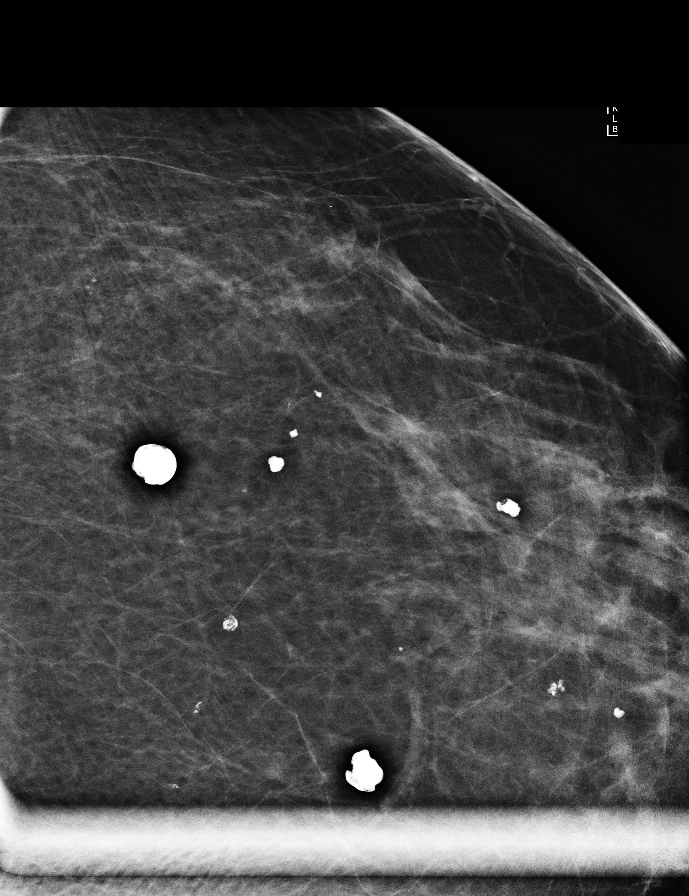

[R CC (1 of 2)]
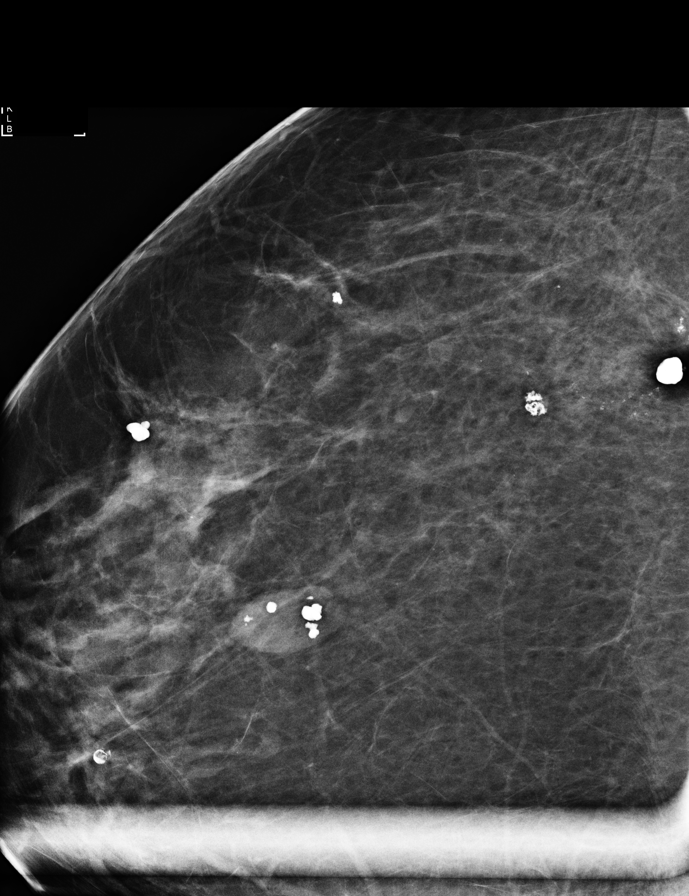

[L CC (2 of 2)]
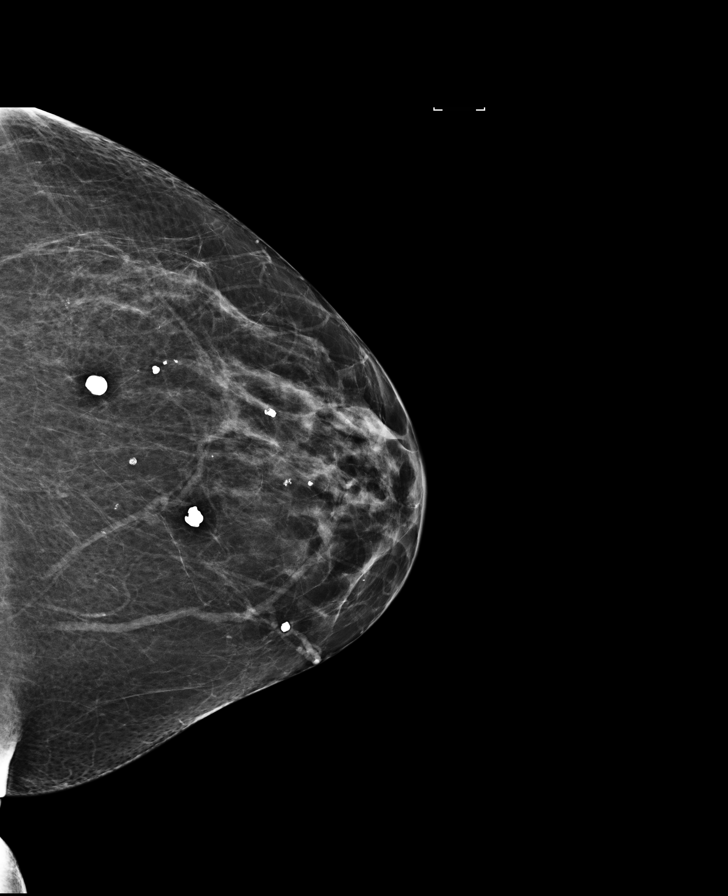

[R CC (2 of 2)]
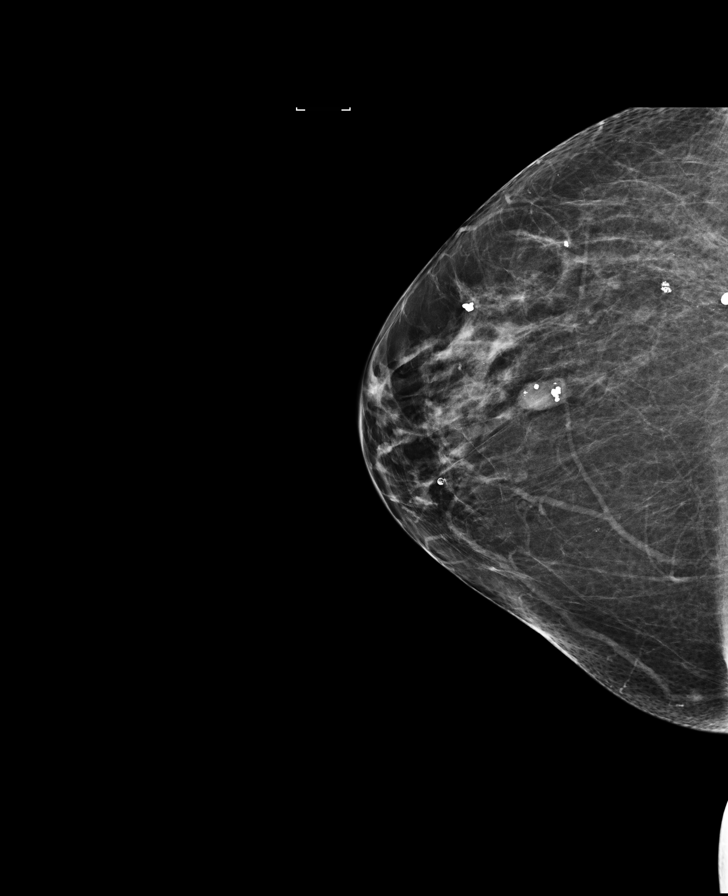

[L CC synth-2D]
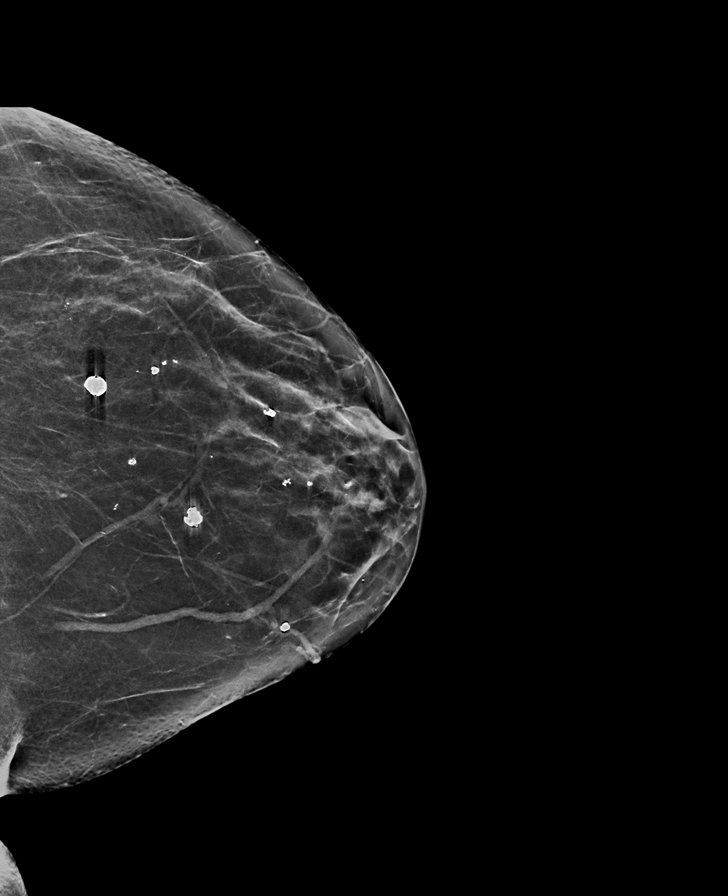

[R MLO synth-2D]
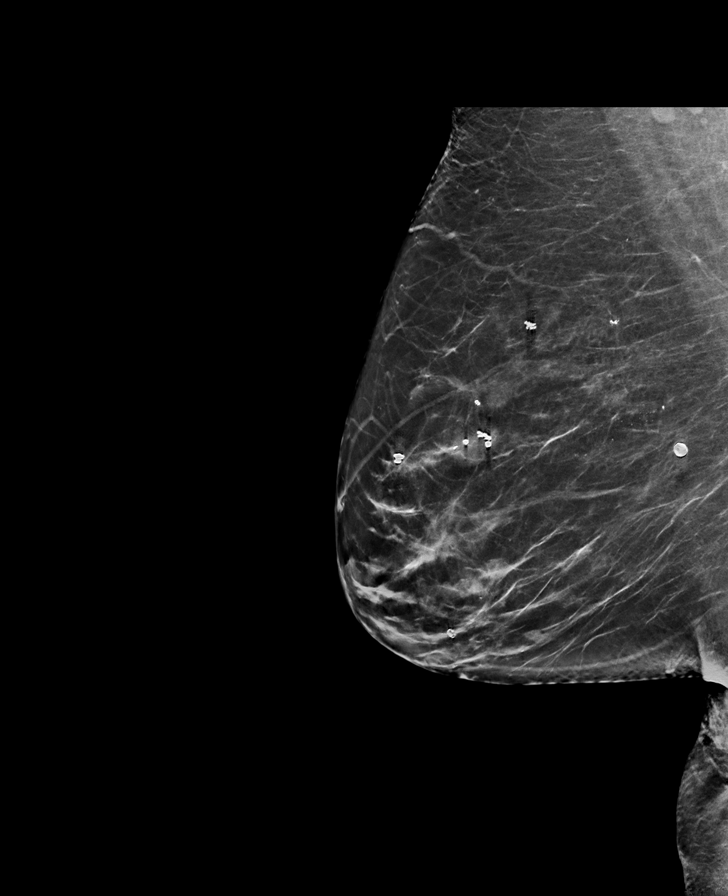

[8 of 35 positions shown; findings below may reference images not displayed]

ACR Breast Density Category b: There are scattered areas of
fibroglandular density.
FINDINGS: There are stable relatively coarse calcifications located within the
lateral, posterior left breast felt to represent probable dystrophic
fibroadenomatoid calcifications. These span 7 mm. There are multiple
bilateral stable nodules with calcifications consistent with
multiple calcified fibroadenomas.

There is an area of calcification with a linear distribution located
within the posterior [DATE] of the upper-outer quadrant of the right
breast. These calcifications span 2.2 cm and are slightly suspicious
for possible DCIS. Tissue sampling is recommended. I have discussed
stereotactic core biopsy of the calcifications with patient. This
will be scheduled.

Mammographic images were processed with CAD.
IMPRESSION: 1. Slightly suspicious calcifications with linear orientation
located within the upper outer quadrant of the right breast. Tissue
sampling is recommended and stereotactic core biopsy will be
scheduled.
2. Stable probably benign dystrophic calcifications located within
the lateral left breast.

RECOMMENDATION:
Right breast stereotactic biopsy.

I have discussed the findings and recommendations with the patient.
Results were also provided in writing at the conclusion of the
visit. If applicable, a reminder letter will be sent to the patient
regarding the next appointment.

BI-RADS CATEGORY  4: Suspicious.

## 2014-10-10 ENCOUNTER — Telehealth: Payer: Self-pay

## 2014-10-10 ENCOUNTER — Encounter: Payer: Self-pay | Admitting: Internal Medicine

## 2014-10-10 NOTE — Telephone Encounter (Signed)
Need report.  Hold until we receive.  Thanks

## 2014-10-10 NOTE — Telephone Encounter (Signed)
Noted  

## 2014-10-10 NOTE — Telephone Encounter (Signed)
Spoke to [place] from the breast center. Alexis Reed stated that patient had mammograph done yesterday. Radiologist recommends patient has a Right breast biopsy done.

## 2014-10-10 NOTE — Telephone Encounter (Signed)
Report placed in green folder 

## 2014-10-12 ENCOUNTER — Other Ambulatory Visit: Payer: Self-pay | Admitting: Internal Medicine

## 2014-10-12 DIAGNOSIS — R928 Other abnormal and inconclusive findings on diagnostic imaging of breast: Secondary | ICD-10-CM

## 2014-10-12 NOTE — Telephone Encounter (Signed)
Pt notified of results.  Order placed for surgery referral.

## 2014-10-12 NOTE — Progress Notes (Signed)
Order placed for surgery referral.  

## 2014-10-12 NOTE — Telephone Encounter (Signed)
Pt is at [facility] now & asked Alexis Reed to call & see why she hasnt heard back from [place]. Patient would like to get this biopsy scheduled/ordered asap.

## 2014-10-17 ENCOUNTER — Encounter: Payer: Self-pay | Admitting: General Surgery

## 2014-10-17 ENCOUNTER — Ambulatory Visit (INDEPENDENT_AMBULATORY_CARE_PROVIDER_SITE_OTHER): Payer: Medicare Other | Admitting: General Surgery

## 2014-10-17 VITALS — BP 130/80 | HR 76 | Resp 12 | Ht 63.0 in | Wt 185.0 lb

## 2014-10-17 DIAGNOSIS — R928 Other abnormal and inconclusive findings on diagnostic imaging of breast: Secondary | ICD-10-CM | POA: Diagnosis not present

## 2014-10-17 DIAGNOSIS — R921 Mammographic calcification found on diagnostic imaging of breast: Secondary | ICD-10-CM

## 2014-10-17 NOTE — Patient Instructions (Addendum)
Patient has been scheduled for a right breast stereotactic biopsy at [facility] for 10-23-14 at 1 pm. She will check-in at the [facility] at 12:30 pm. This patient is aware of date, time, and instructions. Patient verbalizes understanding.

## 2014-10-17 NOTE — Progress Notes (Signed)
Patient ID: Alexis Reed, female   DOB: 1946-03-22, 69 y.o.   MRN: [id number]  Chief Complaint  Patient presents with  . Follow-up    abnormal mammogram    HPI Alexis Reed is a 69 y.o. female  Here to follow up from a mammogram done on 10/06/14 showing calcifications in the right breast with a biopsy recommended. She denies any breast pain or tenderness. Her chest wall pain for which she was evaluated in February of this year has fully cleared.   The patient had had multiple biopsies in the 1970s for palpable lesions. HPI  Past Medical History  Diagnosis Date  . Hypercholesterolemia   . Arthritis   . Fibroids     s/p hysterectomy  . Benign breast lumps     multiple lumps biopsy and remove x's 5 last being 1979 by Dr. Sharlet Salina  . Osteoporosis     Past Surgical History  Procedure Laterality Date  . Abdominal hysterectomy  1997    with bilateral oophorectomy  . Tonsillectomy    . Breast biopsy Right 1975    multiple biopsies done  . Breast biopsy Left     multiple done  . Breast surgery Left     lump removed. Unsure of date  . Breast surgery Right     lump removed. Unsure of date  . Colonoscopy  2010    Dr. Vira Agar    Family History  Problem Relation Age of Onset  . Colon cancer Father   . Stroke Father   . Heart disease Father     myocardial infarction  . Breast cancer Neg Hx     Social History History  Substance Use Topics  . Smoking status: Former Smoker -- 1.00 packs/day for 30 years    Types: Cigarettes    Quit date: 04/26/2004  . Smokeless tobacco: Never Used  . Alcohol Use: 0.0 oz/week    0 Standard drinks or equivalent per week     Comment: occassional    Allergies  Allergen Reactions  . Boniva [Ibandronic Acid] Other (See Comments)    Aching   . Codeine Other (See Comments)    Upset stomach  . Fosamax [Alendronate Sodium]     Caused aching  . Lipitor [Atorvastatin] Hives  . Penicillins     Facial swelling    Current Outpatient  Prescriptions  Medication Sig Dispense Refill  . B Complex Vitamins (VITAMIN B COMPLEX PO) Take 1 tablet by mouth daily.    . Calcium Carbonate-Vitamin D (CALCIUM 600-D) 600-400 MG-UNIT per tablet Take 1 tablet by mouth 2 (two) times daily.    . Cholecalciferol (VITAMIN D) 2000 UNITS tablet Take 2,000 Units by mouth daily.    . Ipratropium-Albuterol (COMBIVENT RESPIMAT) 20-100 MCG/ACT AERS respimat Inhale 1 puff into the lungs every 6 (six) hours as needed for wheezing or shortness of breath. 4 g 5  . meloxicam (MOBIC) 7.5 MG tablet Take 1 tablet (7.5 mg total) by mouth daily as needed. 30 tablet 1  . omeprazole (PRILOSEC) 20 MG capsule Take 1 capsule (20 mg total) by mouth daily. 30 capsule 5   No current facility-administered medications for this visit.    Review of Systems Review of Systems  Constitutional: Negative.   Respiratory: Negative.   Cardiovascular: Negative.     Blood pressure 130/80, pulse 76, resp. rate 12, height 5\' 3"  (1.6 m), weight 185 lb (83.915 kg), last menstrual period 07/20/1995.  Physical Exam Physical Exam  Constitutional: She is oriented  to person, place, and time. She appears well-developed and well-nourished.  Eyes: Conjunctivae are normal. No scleral icterus.  Neck: Neck supple.  Cardiovascular: Normal rate, regular rhythm and normal heart sounds.   Pulmonary/Chest: Effort normal and breath sounds normal. Right breast exhibits no inverted nipple, no mass, no nipple discharge, no skin change and no tenderness. Left breast exhibits no inverted nipple, no mass, no nipple discharge, no skin change and no tenderness.  Lymphadenopathy:    She has no cervical adenopathy.    She has no axillary adenopathy.  Neurological: She is alert and oriented to person, place, and time.    Data Reviewed 2015 and 2016 mammograms reviewed. Radiology report reviewed. Slightly suspicious for possible DCIS.  Minimal interval change between 2015 and 2016. Those were reviewed  with the patient and her husband.  Assessment    Microcalcifications of the right breast, multiple previous benign biopsies.    Plan    Options for management were reviewed: Continued serial mammographic exams with intervention should definite progression be appreciated versus early stereotactic biopsy. Pros and cons of each were reviewed. There is little likelihood of upstaging from DCIS to invasive cancer if indeed DCIS is present with short-term 6-12 month follow-up.  The patient will consider her options and notify the office of how she would like to proceed. At a minimal we'll arrange for a follow-up exam in 6 months.  Patient has been scheduled for a right breast stereotactic biopsy at [facility] for 10-23-14 at 1 pm. She will check-in at the [facility] at 12:30 pm. This patient is aware of date, time, and instructions. Patient verbalizes understanding.     PCP: Nash Shearer 10/18/2014, 4:43 PM

## 2014-10-18 DIAGNOSIS — R921 Mammographic calcification found on diagnostic imaging of breast: Secondary | ICD-10-CM | POA: Insufficient documentation

## 2014-10-23 ENCOUNTER — Ambulatory Visit: Admit: 2014-10-23 | Disposition: A | Discharge status: Home / Self Care | Payer: Self-pay | Admitting: General Surgery

## 2014-10-23 DIAGNOSIS — R920 Mammographic microcalcification found on diagnostic imaging of breast: Secondary | ICD-10-CM | POA: Diagnosis not present

## 2014-10-24 ENCOUNTER — Telehealth: Payer: Self-pay | Admitting: General Surgery

## 2014-10-24 NOTE — Telephone Encounter (Signed)
Notified yesterday's biopsy results were benign.   Reports she is doing well. F/U with staff for wound check next week as planned.

## 2014-10-25 ENCOUNTER — Encounter: Payer: Self-pay | Admitting: General Surgery

## 2014-10-26 ENCOUNTER — Ambulatory Visit (INDEPENDENT_AMBULATORY_CARE_PROVIDER_SITE_OTHER): Payer: Medicare Other | Admitting: *Deleted

## 2014-10-26 DIAGNOSIS — R921 Mammographic calcification found on diagnostic imaging of breast: Secondary | ICD-10-CM

## 2014-10-26 DIAGNOSIS — R928 Other abnormal and inconclusive findings on diagnostic imaging of breast: Secondary | ICD-10-CM

## 2014-10-26 NOTE — Patient Instructions (Signed)
Follow up as scheduled.  

## 2014-10-26 NOTE — Progress Notes (Signed)
Patient came in today for a wound check/post right breast biopsy.  The biopsy site is clean, with no signs of infection noted. She came in sooner because she was having irritation and redness from the steri strips. Steri strips removed.

## 2014-10-30 ENCOUNTER — Telehealth: Payer: Self-pay

## 2014-10-30 ENCOUNTER — Ambulatory Visit: Payer: Medicare Other

## 2014-10-30 ENCOUNTER — Other Ambulatory Visit: Payer: Self-pay | Admitting: *Deleted

## 2014-10-30 MED ORDER — MELOXICAM 7.5 MG PO TABS: 7.5000 mg | ORAL_TABLET | Freq: Every day | ORAL | Status: DC | PRN

## 2014-10-30 NOTE — Telephone Encounter (Signed)
-----   Message from Robert Bellow, MD sent at 10/30/2014  2:28 PM EDT ----- Please notify the patient will arrange for screening mammograms and a brief office visit in one year. Thank you ----- Message -----    From: Augustin Schooling, CMA    Sent: 10/25/2014   9:06 AM      To: Robert Bellow, MD

## 2014-10-30 NOTE — Telephone Encounter (Signed)
Notified patient as instructed, patient pleased. Discussed follow-up appointment in one year, patient agrees

## 2014-10-30 NOTE — Telephone Encounter (Signed)
Ok refill? 

## 2014-10-30 NOTE — Telephone Encounter (Signed)
Refilled meloxicam #30 with one refill

## 2014-11-06 LAB — SURGICAL PATHOLOGY

## 2014-11-14 ENCOUNTER — Ambulatory Visit (INDEPENDENT_AMBULATORY_CARE_PROVIDER_SITE_OTHER): Payer: Medicare Other | Admitting: Internal Medicine

## 2014-11-14 ENCOUNTER — Encounter: Payer: Self-pay | Admitting: Internal Medicine

## 2014-11-14 VITALS — BP 114/72 | HR 77 | Temp 98.0°F | Ht 63.0 in | Wt 182.5 lb

## 2014-11-14 DIAGNOSIS — E669 Obesity, unspecified: Secondary | ICD-10-CM | POA: Diagnosis not present

## 2014-11-14 DIAGNOSIS — Z658 Other specified problems related to psychosocial circumstances: Secondary | ICD-10-CM

## 2014-11-14 DIAGNOSIS — Z0000 Encounter for general adult medical examination without abnormal findings: Secondary | ICD-10-CM

## 2014-11-14 DIAGNOSIS — E7800 Pure hypercholesterolemia, unspecified: Secondary | ICD-10-CM

## 2014-11-14 DIAGNOSIS — R928 Other abnormal and inconclusive findings on diagnostic imaging of breast: Secondary | ICD-10-CM

## 2014-11-14 DIAGNOSIS — K219 Gastro-esophageal reflux disease without esophagitis: Secondary | ICD-10-CM

## 2014-11-14 DIAGNOSIS — F439 Reaction to severe stress, unspecified: Secondary | ICD-10-CM

## 2014-11-14 NOTE — Progress Notes (Signed)
Patient ID: Alexis Reed, female   DOB: 1945/11/01, 69 y.o.   MRN: [id number]   Subjective:    Patient ID: Alexis Reed, female    DOB: Feb 16, 1946, 69 y.o.   MRN: [id number]  HPI  Patient here for a scheduled follow up.  Doing well.  Feels good.  Off lipitor.  Her face was tight - when took her medication.  No rash.  No swelling.  She has been watching her diet.  Exercising.  Feels better. No cardiac symptoms with increased activity or exertion.  No sob. Recent breast biopsy benign.  Dr Bary Castilla recommended f/u in one year.     Past Medical History  Diagnosis Date  . Hypercholesterolemia   . Arthritis   . Fibroids     s/p hysterectomy  . Benign breast lumps     multiple lumps biopsy and remove x's 5 last being 1979 by Dr. Sharlet Salina  . Osteoporosis     Current Outpatient Prescriptions on File Prior to Visit  Medication Sig Dispense Refill  . B Complex Vitamins (VITAMIN B COMPLEX PO) Take 1 tablet by mouth daily.    . Calcium Carbonate-Vitamin D (CALCIUM 600-D) 600-400 MG-UNIT per tablet Take 1 tablet by mouth 2 (two) times daily.    . Cholecalciferol (VITAMIN D) 2000 UNITS tablet Take 2,000 Units by mouth daily.    . Ipratropium-Albuterol (COMBIVENT RESPIMAT) 20-100 MCG/ACT AERS respimat Inhale 1 puff into the lungs every 6 (six) hours as needed for wheezing or shortness of breath. 4 g 5  . meloxicam (MOBIC) 7.5 MG tablet Take 1 tablet (7.5 mg total) by mouth daily as needed. 30 tablet 1  . omeprazole (PRILOSEC) 20 MG capsule Take 1 capsule (20 mg total) by mouth daily. 30 capsule 5   No current facility-administered medications on file prior to visit.    Review of Systems  Constitutional: Negative for appetite change (has been watching her diet.  exercising. ) and unexpected weight change.  HENT: Negative for congestion and sinus pressure.   Respiratory: Negative for cough, chest tightness and shortness of breath.   Cardiovascular: Negative for chest pain, palpitations and  leg swelling.  Gastrointestinal: Negative for nausea, vomiting, abdominal pain and diarrhea.  Neurological: Negative for dizziness, light-headedness and headaches.       Objective:     Blood pressure recheck:  114/72  Physical Exam  Constitutional: She appears well-developed and well-nourished. No distress.  HENT:  Nose: Nose normal.  Mouth/Throat: Oropharynx is clear and moist.  Neck: Neck supple. No thyromegaly present.  Cardiovascular: Normal rate and regular rhythm.   Pulmonary/Chest: Breath sounds normal. No respiratory distress. She has no wheezes.  Abdominal: Soft. Bowel sounds are normal. There is no tenderness.  Musculoskeletal: She exhibits no edema or tenderness.  Lymphadenopathy:    She has no cervical adenopathy.  Psychiatric: She has a normal mood and affect. Her behavior is normal.    BP 114/72 mmHg  Pulse 77  Temp(Src) 98 F (36.7 C) (Oral)  Ht 5\' 3"  (1.6 m)  Wt 182 lb 8 oz (82.781 kg)  BMI 32.34 kg/m2  SpO2 94%  LMP 07/20/1995 Wt Readings from Last 3 Encounters:  11/14/14 182 lb 8 oz (82.781 kg)  10/17/14 185 lb (83.915 kg)  08/16/14 186 lb (84.369 kg)     Lab Results  Component Value Date   WBC 6.6 07/17/2014   HGB 14.7 07/17/2014   HCT 44.5 07/17/2014   PLT 281.0 07/17/2014   GLUCOSE 93 07/17/2014  CHOL 267* 07/17/2014   TRIG 200.0* 07/17/2014   HDL 50.40 07/17/2014   LDLDIRECT 164.5 06/20/2013   LDLCALC 177* 07/17/2014   ALT 16 07/17/2014   AST 18 07/17/2014   NA 140 07/17/2014   K 4.3 07/17/2014   CL 105 07/17/2014   CREATININE 0.6 07/17/2014   BUN 12 07/17/2014   CO2 26 07/17/2014   TSH 1.96 07/17/2014       Assessment & Plan:   Problem List Items Addressed This Visit    Abnormal mammogram    Bilateral mammogram - 10/09/14 - Birads IV.  Biopsy benign.  Saw Dr Bary Castilla.  Recommended f/u in one year.        GERD (gastroesophageal reflux disease) - Primary    On prilosec.  Symptoms controlled.        Health care maintenance     Physical 07/19/14.  Mammogram 10/09/14 - birads IV.  Biopsy - benign.  Saw Dr Bary Castilla.  Recommend f/u in one year.  Referred to GI for colonoscopy.       Hypercholesterolemia    Did not tolerate lipitor.  Has adjusted her diet.  Is exercising.  Follow lipid panel.       Relevant Orders   Comprehensive metabolic panel   Lipid panel   Obesity (BMI 30-39.9)    Diet and exercise.  Has lost weight.  Adjusted her diet.  Follow.        Stress    Feels she is handling things relatively well.  Follow.            Einar Pheasant, MD

## 2014-11-14 NOTE — Progress Notes (Signed)
Pre visit review using our clinic review tool, if applicable. No additional management support is needed unless otherwise documented below in the visit note. 

## 2014-11-17 ENCOUNTER — Ambulatory Visit: Payer: Medicare Other | Admitting: Internal Medicine

## 2014-11-18 ENCOUNTER — Encounter: Payer: Self-pay | Admitting: Internal Medicine

## 2014-11-18 DIAGNOSIS — Z0000 Encounter for general adult medical examination without abnormal findings: Secondary | ICD-10-CM | POA: Insufficient documentation

## 2014-11-18 NOTE — Assessment & Plan Note (Signed)
Diet and exercise.  Has lost weight.  Adjusted her diet.  Follow.

## 2014-11-18 NOTE — Assessment & Plan Note (Signed)
Bilateral mammogram - 10/09/14 - Birads IV.  Biopsy benign.  Saw Dr Bary Castilla.  Recommended f/u in one year.

## 2014-11-18 NOTE — Assessment & Plan Note (Signed)
Feels she is handling things relatively well.  Follow.   

## 2014-11-18 NOTE — Assessment & Plan Note (Signed)
Did not tolerate lipitor.  Has adjusted her diet.  Is exercising.  Follow lipid panel.

## 2014-11-18 NOTE — Assessment & Plan Note (Signed)
On prilosec.  Symptoms controlled.   

## 2014-11-18 NOTE — Assessment & Plan Note (Addendum)
Physical 07/19/14.  Mammogram 10/09/14 - birads IV.  Biopsy - benign.  Saw Dr Bary Castilla.  Recommend f/u in one year.  Referred to GI for colonoscopy.

## 2014-11-29 ENCOUNTER — Other Ambulatory Visit (INDEPENDENT_AMBULATORY_CARE_PROVIDER_SITE_OTHER): Payer: Medicare Other

## 2014-11-29 ENCOUNTER — Encounter: Payer: Self-pay | Admitting: Internal Medicine

## 2014-11-29 DIAGNOSIS — E7800 Pure hypercholesterolemia, unspecified: Secondary | ICD-10-CM

## 2014-11-29 LAB — COMPREHENSIVE METABOLIC PANEL
ALT: 12 U/L (ref 0–35)
AST: 14 U/L (ref 0–37)
Albumin: 4.2 g/dL (ref 3.5–5.2)
Alkaline Phosphatase: 88 U/L (ref 39–117)
BUN: 14 mg/dL (ref 6–23)
CO2: 26 mEq/L (ref 19–32)
Calcium: 9.6 mg/dL (ref 8.4–10.5)
Chloride: 105 mEq/L (ref 96–112)
Creatinine, Ser: 0.57 mg/dL (ref 0.40–1.20)
GFR: 112.54 mL/min (ref >60.00)
Glucose, Bld: 92 mg/dL (ref 70–99)
Potassium: 4.1 mEq/L (ref 3.5–5.1)
Sodium: 138 mEq/L (ref 135–145)
Total Bilirubin: 0.5 mg/dL (ref 0.2–1.2)
Total Protein: 7.2 g/dL (ref 6.0–8.3)

## 2014-11-29 LAB — LIPID PANEL
Cholesterol: 205 mg/dL — ABNORMAL HIGH (ref 0–200)
HDL: 53 mg/dL (ref >39.00)
LDL Cholesterol: 127 mg/dL — ABNORMAL HIGH (ref 0–99)
NonHDL: 152
Total CHOL/HDL Ratio: 4
Triglycerides: 124 mg/dL (ref 0.0–149.0)
VLDL: 24.8 mg/dL (ref 0.0–40.0)

## 2014-11-29 LAB — HEPATIC FUNCTION PANEL
ALT: 12 U/L (ref 0–35)
AST: 14 U/L (ref 0–37)
Albumin: 4.2 g/dL (ref 3.5–5.2)
Alkaline Phosphatase: 88 U/L (ref 39–117)
Bilirubin, Direct: 0.1 mg/dL (ref 0.0–0.3)
Total Bilirubin: 0.5 mg/dL (ref 0.2–1.2)
Total Protein: 7.2 g/dL (ref 6.0–8.3)

## 2014-12-05 LAB — HM COLONOSCOPY

## 2015-04-27 ENCOUNTER — Other Ambulatory Visit: Payer: Self-pay | Admitting: Surgical

## 2015-04-27 MED ORDER — OMEPRAZOLE 20 MG PO CPDR: 20.0000 mg | DELAYED_RELEASE_CAPSULE | Freq: Every day | ORAL | Status: DC

## 2015-04-30 ENCOUNTER — Encounter: Payer: Self-pay | Admitting: Internal Medicine

## 2015-04-30 MED ORDER — OMEPRAZOLE 20 MG PO CPDR: 20.0000 mg | DELAYED_RELEASE_CAPSULE | Freq: Every day | ORAL | Status: DC

## 2015-04-30 NOTE — Addendum Note (Signed)
Addended by: Bevelyn Ngo on: 04/30/2015 09:19 AM   Modules accepted: Orders

## 2015-07-17 ENCOUNTER — Telehealth: Payer: Self-pay | Admitting: *Deleted

## 2015-07-17 ENCOUNTER — Other Ambulatory Visit (INDEPENDENT_AMBULATORY_CARE_PROVIDER_SITE_OTHER): Payer: Medicare HMO

## 2015-07-17 DIAGNOSIS — M810 Age-related osteoporosis without current pathological fracture: Secondary | ICD-10-CM

## 2015-07-17 DIAGNOSIS — E7800 Pure hypercholesterolemia, unspecified: Secondary | ICD-10-CM

## 2015-07-17 LAB — CBC WITH DIFFERENTIAL/PLATELET
Basophils Absolute: 0 10*3/uL (ref 0.0–0.1)
Basophils Relative: 1 % (ref 0.0–3.0)
Eosinophils Absolute: 0.2 10*3/uL (ref 0.0–0.7)
Eosinophils Relative: 3.3 % (ref 0.0–5.0)
HCT: 44.2 % (ref 36.0–46.0)
Hemoglobin: 14.5 g/dL (ref 12.0–15.0)
Lymphocytes Relative: 33.3 % (ref 12.0–46.0)
Lymphs Abs: 1.7 10*3/uL (ref 0.7–4.0)
MCHC: 32.8 g/dL (ref 30.0–36.0)
MCV: 92.4 fl (ref 78.0–100.0)
Monocytes Absolute: 0.5 10*3/uL (ref 0.1–1.0)
Monocytes Relative: 9.7 % (ref 3.0–12.0)
Neutro Abs: 2.7 10*3/uL (ref 1.4–7.7)
Neutrophils Relative %: 52.7 % (ref 43.0–77.0)
Platelets: 285 10*3/uL (ref 150.0–400.0)
RBC: 4.78 Mil/uL (ref 3.87–5.11)
RDW: 14.3 % (ref 11.5–15.5)
WBC: 5.1 10*3/uL (ref 4.0–10.5)

## 2015-07-17 LAB — COMPREHENSIVE METABOLIC PANEL
ALT: 12 U/L (ref 0–35)
AST: 13 U/L (ref 0–37)
Albumin: 4.1 g/dL (ref 3.5–5.2)
Alkaline Phosphatase: 87 U/L (ref 39–117)
BUN: 15 mg/dL (ref 6–23)
CO2: 31 mEq/L (ref 19–32)
Calcium: 9.5 mg/dL (ref 8.4–10.5)
Chloride: 104 mEq/L (ref 96–112)
Creatinine, Ser: 0.55 mg/dL (ref 0.40–1.20)
GFR: 117.05 mL/min (ref >60.00)
Glucose, Bld: 86 mg/dL (ref 70–99)
Potassium: 4.6 mEq/L (ref 3.5–5.1)
Sodium: 141 mEq/L (ref 135–145)
Total Bilirubin: 0.4 mg/dL (ref 0.2–1.2)
Total Protein: 6.6 g/dL (ref 6.0–8.3)

## 2015-07-17 LAB — LIPID PANEL
Cholesterol: 241 mg/dL — ABNORMAL HIGH (ref 0–200)
HDL: 50.1 mg/dL (ref >39.00)
NonHDL: 190.94
Total CHOL/HDL Ratio: 5
Triglycerides: 211 mg/dL — ABNORMAL HIGH (ref 0.0–149.0)
VLDL: 42.2 mg/dL — ABNORMAL HIGH (ref 0.0–40.0)

## 2015-07-17 LAB — TSH: TSH: 1.81 u[IU]/mL (ref 0.35–4.50)

## 2015-07-17 LAB — LDL CHOLESTEROL, DIRECT: Direct LDL: 153 mg/dL

## 2015-07-17 LAB — VITAMIN D 25 HYDROXY (VIT D DEFICIENCY, FRACTURES): VITD: 21.36 ng/mL — ABNORMAL LOW (ref 30.00–100.00)

## 2015-07-17 NOTE — Telephone Encounter (Signed)
Labs and dx?  

## 2015-07-17 NOTE — Telephone Encounter (Signed)
Orders placed for labs

## 2015-07-18 ENCOUNTER — Other Ambulatory Visit: Payer: Medicare Other

## 2015-07-18 ENCOUNTER — Encounter: Payer: Self-pay | Admitting: Internal Medicine

## 2015-07-20 ENCOUNTER — Other Ambulatory Visit: Payer: Self-pay

## 2015-07-20 DIAGNOSIS — Z1231 Encounter for screening mammogram for malignant neoplasm of breast: Secondary | ICD-10-CM

## 2015-07-24 ENCOUNTER — Encounter: Payer: Self-pay | Admitting: Internal Medicine

## 2015-07-24 ENCOUNTER — Ambulatory Visit (INDEPENDENT_AMBULATORY_CARE_PROVIDER_SITE_OTHER): Payer: Medicare HMO | Admitting: Internal Medicine

## 2015-07-24 VITALS — BP 110/70 | HR 73 | Temp 98.0°F | Resp 18 | Ht 63.0 in | Wt 185.0 lb

## 2015-07-24 DIAGNOSIS — F17200 Nicotine dependence, unspecified, uncomplicated: Secondary | ICD-10-CM

## 2015-07-24 DIAGNOSIS — R928 Other abnormal and inconclusive findings on diagnostic imaging of breast: Secondary | ICD-10-CM | POA: Diagnosis not present

## 2015-07-24 DIAGNOSIS — E7800 Pure hypercholesterolemia, unspecified: Secondary | ICD-10-CM

## 2015-07-24 DIAGNOSIS — Z1239 Encounter for other screening for malignant neoplasm of breast: Secondary | ICD-10-CM | POA: Diagnosis not present

## 2015-07-24 DIAGNOSIS — F439 Reaction to severe stress, unspecified: Secondary | ICD-10-CM

## 2015-07-24 DIAGNOSIS — K219 Gastro-esophageal reflux disease without esophagitis: Secondary | ICD-10-CM

## 2015-07-24 DIAGNOSIS — Z0000 Encounter for general adult medical examination without abnormal findings: Secondary | ICD-10-CM | POA: Diagnosis not present

## 2015-07-24 DIAGNOSIS — E2839 Other primary ovarian failure: Secondary | ICD-10-CM | POA: Diagnosis not present

## 2015-07-24 DIAGNOSIS — R921 Mammographic calcification found on diagnostic imaging of breast: Secondary | ICD-10-CM

## 2015-07-24 DIAGNOSIS — E669 Obesity, unspecified: Secondary | ICD-10-CM

## 2015-07-24 DIAGNOSIS — M810 Age-related osteoporosis without current pathological fracture: Secondary | ICD-10-CM

## 2015-07-24 DIAGNOSIS — Z658 Other specified problems related to psychosocial circumstances: Secondary | ICD-10-CM

## 2015-07-24 MED ORDER — ROSUVASTATIN CALCIUM 5 MG PO TABS: 5.0000 mg | ORAL_TABLET | Freq: Every day | ORAL | Status: DC

## 2015-07-24 NOTE — Progress Notes (Signed)
Subjective:    Patient ID: Alexis Reed, female    DOB: [birth year], 70 y.o.   MRN: [id number]  HPI  Patient with past history of hypercholesterolemia and osteoporosis.  She comes in today to follow up on these issues as well as for her physical exam.  She reports she is doing well.  Tries to stay active.  No cardiac symptoms with increased activity or exertion.  No sob.  No acid reflux.  No abdominal pain or cramping.  Bowels stable.  Increased stress.  Feels she is handling things relatively well.  Discussed her recent labs and elevated cholesterol.     Past Medical History  Diagnosis Date  . Hypercholesterolemia   . Arthritis   . Fibroids     s/p hysterectomy  . Benign breast lumps     multiple lumps biopsy and remove x's 5 last being 1979 by Dr. Sharlet Salina  . Osteoporosis    Past Surgical History  Procedure Laterality Date  . Abdominal hysterectomy  1997    with bilateral oophorectomy  . Tonsillectomy    . Breast biopsy Right 1975    multiple biopsies done  . Breast biopsy Left     multiple done  . Breast surgery Left     lump removed. Unsure of date  . Breast surgery Right     lump removed. Unsure of date  . Colonoscopy  2010    Dr. Vira Agar   Family History  Problem Relation Age of Onset  . Colon cancer Father   . Stroke Father   . Heart disease Father     myocardial infarction  . Breast cancer Neg Hx    Social History   Social History  . Marital Status: Married    Spouse Name: N/A  . Number of Children: 2  . Years of Education: N/A   Social History Main Topics  . Smoking status: Former Smoker -- 1.00 packs/day for 30 years    Types: Cigarettes    Quit date: 04/26/2004  . Smokeless tobacco: Never Used  . Alcohol Use: 0.0 oz/week    0 Standard drinks or equivalent per week     Comment: occassional  . Drug Use: No  . Sexual Activity: Not Asked   Other Topics Concern  . None   Social History Narrative   She is married, has two children. Works at  [employer]    Outpatient Encounter Prescriptions as of 07/24/2015  Medication Sig  . B Complex Vitamins (VITAMIN B COMPLEX PO) Take 1 tablet by mouth daily.  . Calcium Carbonate-Vitamin D (CALCIUM 600-D) 600-400 MG-UNIT per tablet Take 1 tablet by mouth 2 (two) times daily.  . Cholecalciferol (VITAMIN D) 2000 UNITS tablet Take 2,000 Units by mouth daily.  . Ipratropium-Albuterol (COMBIVENT RESPIMAT) 20-100 MCG/ACT AERS respimat Inhale 1 puff into the lungs every 6 (six) hours as needed for wheezing or shortness of breath.  . meloxicam (MOBIC) 7.5 MG tablet Take 1 tablet (7.5 mg total) by mouth daily as needed.  Marland Kitchen omeprazole (PRILOSEC) 20 MG capsule Take 1 capsule (20 mg total) by mouth daily.  . rosuvastatin (CRESTOR) 5 MG tablet Take 1 tablet (5 mg total) by mouth daily.   No facility-administered encounter medications on file as of 07/24/2015.    Review of Systems  Constitutional: Negative for fatigue and unexpected weight change.  HENT: Negative for congestion and sinus pressure.   Eyes: Negative for pain and visual disturbance.  Respiratory: Negative for cough,  chest tightness and shortness of breath.   Cardiovascular: Negative for chest pain, palpitations and leg swelling.  Gastrointestinal: Negative for nausea, vomiting, abdominal pain and diarrhea.  Genitourinary: Negative for dysuria and difficulty urinating.  Musculoskeletal: Negative for back pain and joint swelling.  Skin: Negative for color change and rash.  Neurological: Negative for dizziness, light-headedness and headaches.  Hematological: Negative for adenopathy. Does not bruise/bleed easily.  Psychiatric/Behavioral: Negative for dysphoric mood and agitation.       Objective:    Physical Exam  Constitutional: She is oriented to person, place, and time. She appears well-developed and well-nourished. No distress.  HENT:  Nose: Nose normal.  Mouth/Throat: Oropharynx is clear and moist.    Eyes: Right eye exhibits no discharge. Left eye exhibits no discharge. No scleral icterus.  Neck: Neck supple. No thyromegaly present.  Cardiovascular: Normal rate and regular rhythm.   Pulmonary/Chest: Breath sounds normal. No accessory muscle usage. No tachypnea. No respiratory distress. She has no decreased breath sounds. She has no wheezes. She has no rhonchi. Right breast exhibits no inverted nipple, no mass, no nipple discharge and no tenderness (no axillary adenopathy). Left breast exhibits no inverted nipple, no mass, no nipple discharge and no tenderness (no axilarry adenopathy).  Abdominal: Soft. Bowel sounds are normal. There is no tenderness.  Musculoskeletal: She exhibits no edema or tenderness.  Lymphadenopathy:    She has no cervical adenopathy.  Neurological: She is alert and oriented to person, place, and time.  Skin: Skin is warm. No rash noted. No erythema.  Psychiatric: She has a normal mood and affect. Her behavior is normal.    BP 110/70 mmHg  Pulse 73  Temp(Src) 98 F (36.7 C) (Oral)  Resp 18  Ht 5\' 3"  (1.6 m)  Wt 185 lb (83.915 kg)  BMI 32.78 kg/m2  SpO2 98%  LMP 07/20/1995 Wt Readings from Last 3 Encounters:  07/24/15 185 lb (83.915 kg)  11/14/14 182 lb 8 oz (82.781 kg)  10/17/14 185 lb (83.915 kg)     Lab Results  Component Value Date   WBC 5.1 07/17/2015   HGB 14.5 07/17/2015   HCT 44.2 07/17/2015   PLT 285.0 07/17/2015   GLUCOSE 86 07/17/2015   CHOL 241* 07/17/2015   TRIG 211.0* 07/17/2015   HDL 50.10 07/17/2015   LDLDIRECT 153.0 07/17/2015   LDLCALC 127* 11/29/2014   ALT 12 07/17/2015   AST 13 07/17/2015   NA 141 07/17/2015   K 4.6 07/17/2015   CL 104 07/17/2015   CREATININE 0.55 07/17/2015   BUN 15 07/17/2015   CO2 31 07/17/2015   TSH 1.81 07/17/2015       Assessment & Plan:   Problem List Items Addressed This Visit    Abnormal mammogram    Bilateral mammogram 10/09/14 - Birads IV.  Biopsy benign.  Saw Dr Bary Castilla.  Recommended  f/u in one year.  Ordered and scheduled.        Breast calcifications on mammogram   Relevant Orders   MM Digital Diagnostic Bilat   US BREAST LTD UNI LEFT INC AXILLA   US BREAST LTD UNI RIGHT INC AXILLA   GERD (gastroesophageal reflux disease) - Primary    Symptoms controlled on prilosec.        Health care maintenance    Physical today 07/24/15.  Mammogram 10/09/14 - birads IV.  Biopsy benign.  Saw Dr Bary Castilla.  Recommended f/u mammogram in one year.        Hypercholesterolemia    Cholesterol  elevated.  Start crestor 1/2 5mg  q Monday, Wednesday and Friday.  Will increase as tolerated.  Check liver panel in 6 weeks.        Relevant Medications   rosuvastatin (CRESTOR) 5 MG tablet   Other Relevant Orders   Hepatic function panel   Obesity (BMI 30-39.9)    Diet and exercise.        Osteoporosis    Has tried fosamax and boniva.  Caused bone pain.  Given has been a while since has had a bone density, will schedule f/u bone density.  Continue calcium and weight bearing exercise.        Stress    Increased stress.  She feels she is handling things relatively well.  Follow.  Does not feel needs any further intervention.       Tobacco use disorder    Had low dose cancer screening as outlined.  Benign appearance.  Recommended continue annual screening.  Confirm with pt if has had CT.        Other Visit Diagnoses    Screening breast examination        Relevant Orders    MM DIGITAL SCREENING BILATERAL    Estrogen deficiency        Relevant Orders    DG Bone Density        Einar Pheasant, MD

## 2015-07-24 NOTE — Progress Notes (Signed)
Pre-visit discussion using our clinic review tool. No additional management support is needed unless otherwise documented below in the visit note.  

## 2015-07-25 ENCOUNTER — Encounter: Payer: Self-pay | Admitting: Internal Medicine

## 2015-07-30 ENCOUNTER — Telehealth: Payer: Self-pay | Admitting: Internal Medicine

## 2015-07-30 ENCOUNTER — Encounter: Payer: Self-pay | Admitting: Internal Medicine

## 2015-07-30 NOTE — Assessment & Plan Note (Signed)
Physical today 07/24/15.  Mammogram 10/09/14 - birads IV.  Biopsy benign.  Saw Dr Bary Castilla.  Recommended f/u mammogram in one year.

## 2015-07-30 NOTE — Assessment & Plan Note (Signed)
Symptoms controlled on prilosec.   

## 2015-07-30 NOTE — Assessment & Plan Note (Signed)
Bilateral mammogram 10/09/14 - Birads IV.  Biopsy benign.  Saw Dr Bary Castilla.  Recommended f/u in one year.  Ordered and scheduled.

## 2015-07-30 NOTE — Assessment & Plan Note (Signed)
Diet and exercise.   

## 2015-07-30 NOTE — Assessment & Plan Note (Signed)
Had low dose cancer screening as outlined.  Benign appearance.  Recommended continue annual screening.  Confirm with pt if has had CT.

## 2015-07-30 NOTE — Telephone Encounter (Signed)
Sent my chart message to pt about scheduling screening chest CT.

## 2015-07-30 NOTE — Assessment & Plan Note (Signed)
Increased stress.  She feels she is handling things relatively well.  Follow.  Does not feel needs any further intervention.

## 2015-07-30 NOTE — Assessment & Plan Note (Signed)
Cholesterol elevated.  Start crestor 1/2 5mg  q Monday, Wednesday and Friday.  Will increase as tolerated.  Check liver panel in 6 weeks.

## 2015-07-30 NOTE — Assessment & Plan Note (Signed)
Has tried fosamax and boniva.  Caused bone pain.  Given has been a while since has had a bone density, will schedule f/u bone density.  Continue calcium and weight bearing exercise.

## 2015-08-01 ENCOUNTER — Ambulatory Visit: Payer: Medicare HMO

## 2015-08-01 NOTE — Telephone Encounter (Signed)
Unread mychart message mailed to patient 

## 2015-08-03 ENCOUNTER — Telehealth: Payer: Self-pay | Admitting: *Deleted

## 2015-08-03 NOTE — Telephone Encounter (Signed)
Patient called to schedule annual lung cancer screening scan. Verified patient's age, no signs of lung cancer, no illness that would prevent patient from receiving treatment for lung cancer, and smoking history (former smoker with 30 pack year history, quit date 2011). Her original shared decision making visit was 06/14/14. Patient is expecting call from scheduling with appointment for screening scan and knows to call me with any questions.

## 2015-08-03 NOTE — Telephone Encounter (Signed)
Thank you for the update.  Do you need me to do anything.

## 2015-08-07 ENCOUNTER — Encounter: Payer: Self-pay | Admitting: Internal Medicine

## 2015-08-07 ENCOUNTER — Encounter: Payer: Self-pay | Admitting: Family Medicine

## 2015-08-07 ENCOUNTER — Other Ambulatory Visit: Payer: Self-pay | Admitting: Family Medicine

## 2015-08-07 DIAGNOSIS — Z87891 Personal history of nicotine dependence: Secondary | ICD-10-CM

## 2015-08-07 HISTORY — DX: Personal history of nicotine dependence: Z87.891

## 2015-08-08 ENCOUNTER — Ambulatory Visit
Admission: RE | Admit: 2015-08-08 | Discharge: 2015-08-08 | Disposition: A | Discharge status: Home / Self Care | Payer: Medicare HMO | Source: Ambulatory Visit | Attending: Internal Medicine | Admitting: Internal Medicine

## 2015-08-08 DIAGNOSIS — Z13820 Encounter for screening for osteoporosis: Secondary | ICD-10-CM | POA: Diagnosis not present

## 2015-08-08 DIAGNOSIS — Z90710 Acquired absence of both cervix and uterus: Secondary | ICD-10-CM | POA: Insufficient documentation

## 2015-08-08 DIAGNOSIS — M810 Age-related osteoporosis without current pathological fracture: Secondary | ICD-10-CM | POA: Diagnosis not present

## 2015-08-08 DIAGNOSIS — E2839 Other primary ovarian failure: Secondary | ICD-10-CM

## 2015-08-09 ENCOUNTER — Encounter: Payer: Self-pay | Admitting: Internal Medicine

## 2015-08-09 DIAGNOSIS — M810 Age-related osteoporosis without current pathological fracture: Secondary | ICD-10-CM

## 2015-08-13 ENCOUNTER — Encounter: Payer: Self-pay | Admitting: *Deleted

## 2015-08-13 ENCOUNTER — Ambulatory Visit
Admission: RE | Admit: 2015-08-13 | Discharge: 2015-08-13 | Disposition: A | Discharge status: Home / Self Care | Payer: Medicare HMO | Source: Ambulatory Visit | Attending: Family Medicine | Admitting: Family Medicine

## 2015-08-13 DIAGNOSIS — J432 Centrilobular emphysema: Secondary | ICD-10-CM | POA: Insufficient documentation

## 2015-08-13 DIAGNOSIS — I2510 Atherosclerotic heart disease of native coronary artery without angina pectoris: Secondary | ICD-10-CM | POA: Insufficient documentation

## 2015-08-13 DIAGNOSIS — Z87891 Personal history of nicotine dependence: Secondary | ICD-10-CM | POA: Insufficient documentation

## 2015-08-13 IMAGING — CT CT CHEST LUNG CANCER SCREENING LOW DOSE W/O CM
1 of 2 series · 14 of 40 positions shown, 18 images · non-contrast
Comparison: Low-dose lung cancer screening chest CT [DATE].

CLINICAL DATA: 69-year-old female former smoker (quit 6 years ago)
with 30 pack-year history of smoking. Lung cancer screening
examination.

EXAM:
CT CHEST WITHOUT CONTRAST LOW-DOSE FOR LUNG CANCER SCREENING
TECHNIQUE: Multidetector CT imaging of the chest was performed following the
standard protocol without IV contrast.

[Series 2: axial st · axial · 0.64mm/px · z∈[-648,-378]mm · 14 of 62 slices shown, 18 images]
[im 5/62  mediastinal]
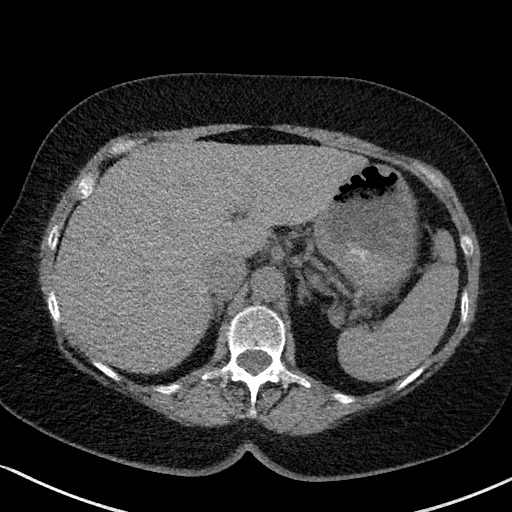
[im 5/62  lung]
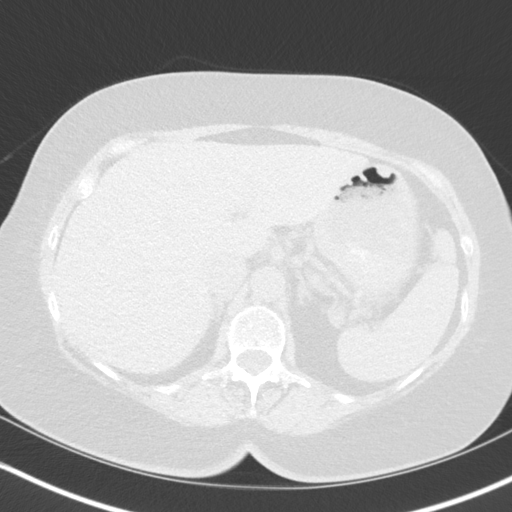
[im 10/62  lung]
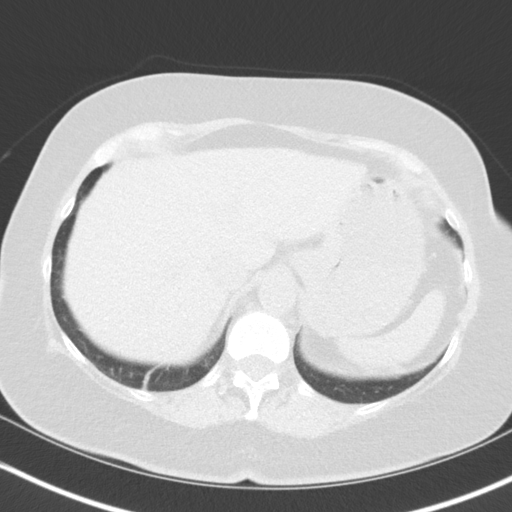
[im 15/62  lung]
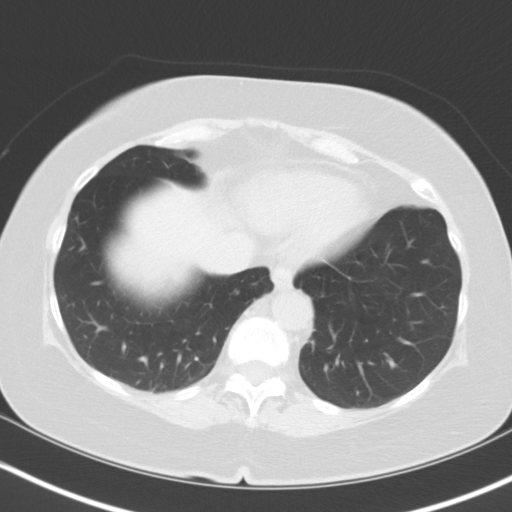
[im 17/62  lung]
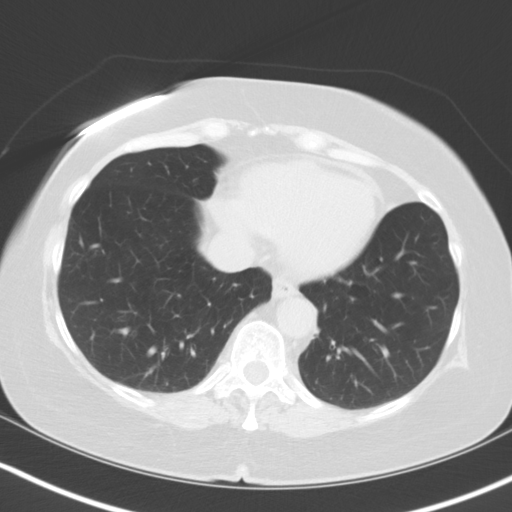
[im 22/62  mediastinal]
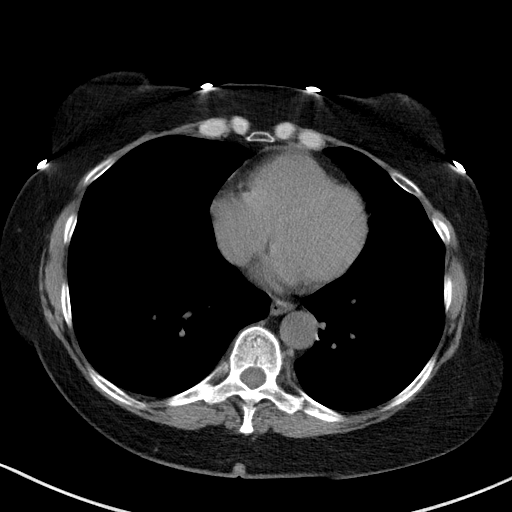
[im 22/62  lung]
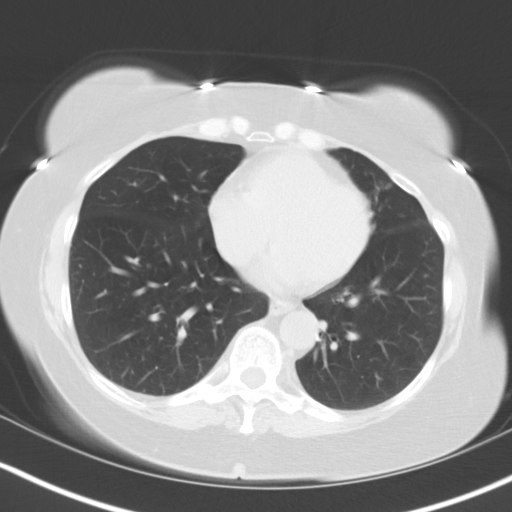
[im 26/62  lung]
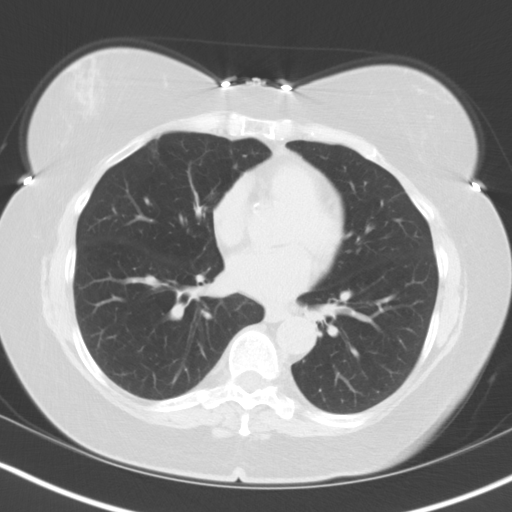
[im 30/62  lung]
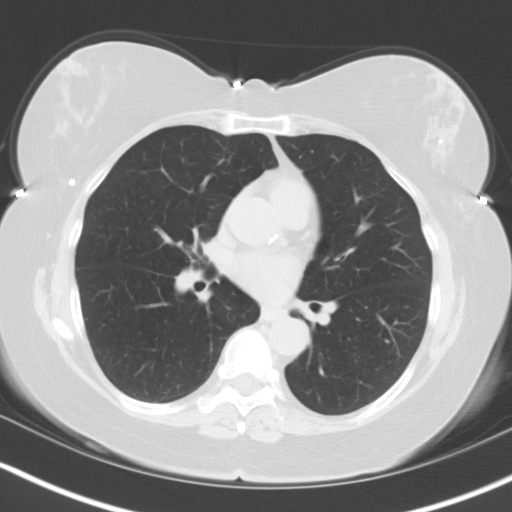
[im 33/62  lung]
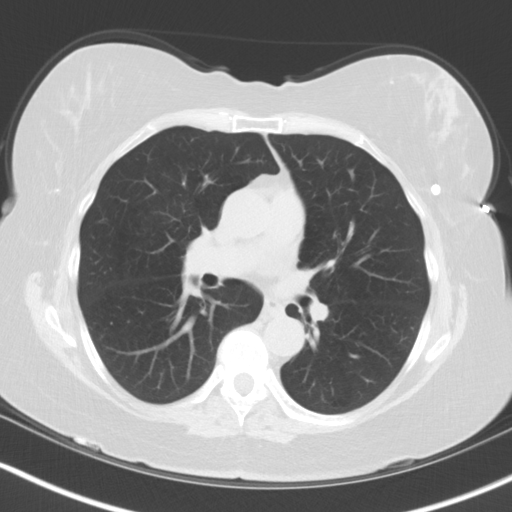
[im 38/62  mediastinal]
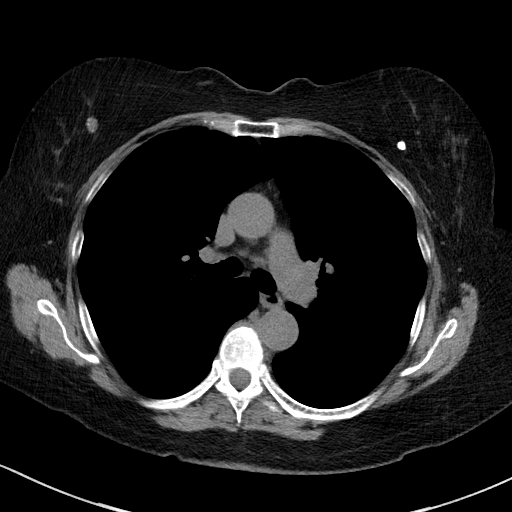
[im 38/62  lung]
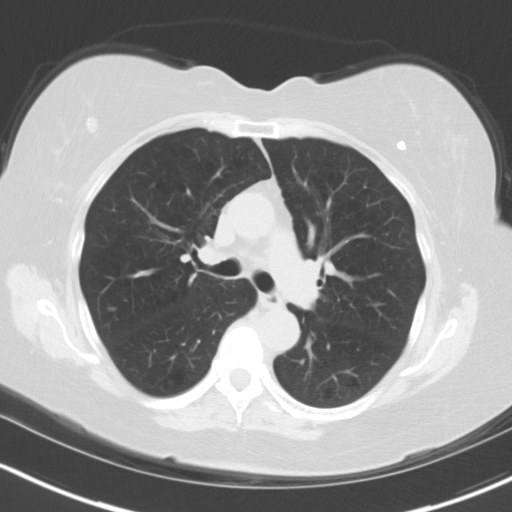
[im 43/62  lung]
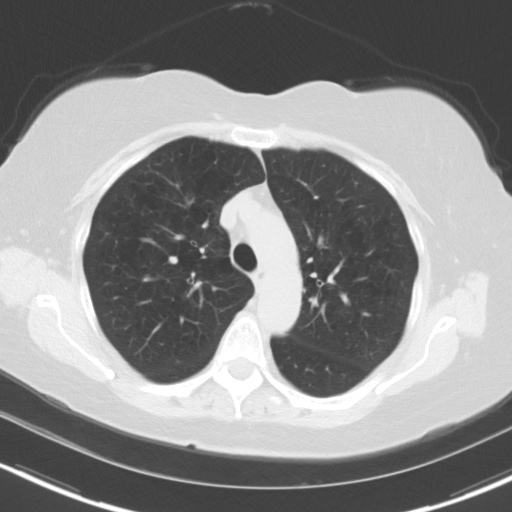
[im 46/62  lung]
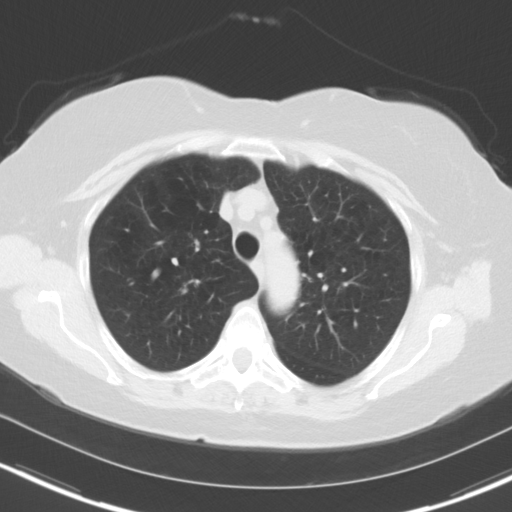
[im 50/62  lung]
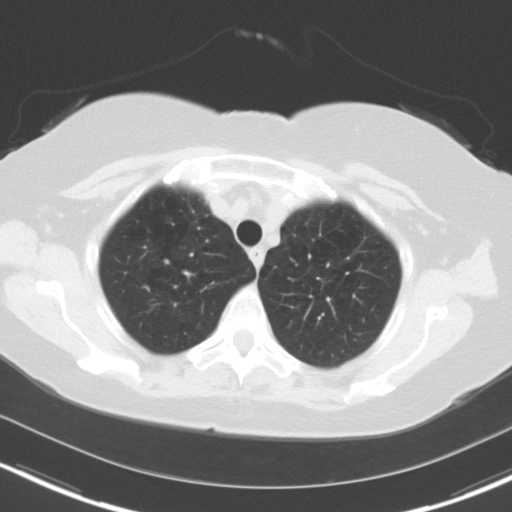
[im 54/62  mediastinal]
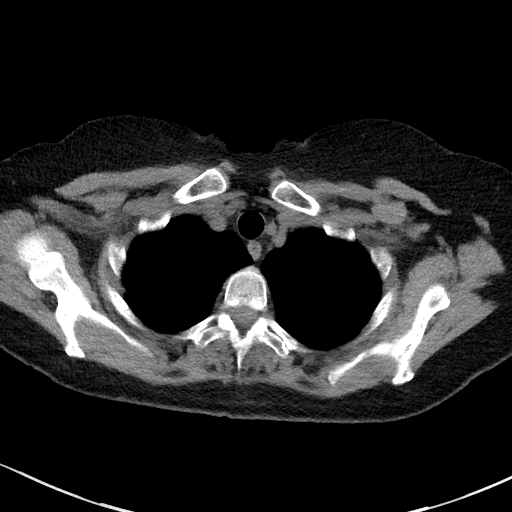
[im 54/62  lung]
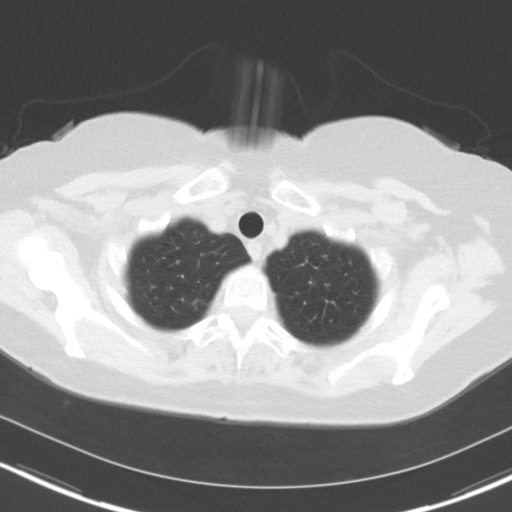
[im 59/62  lung]
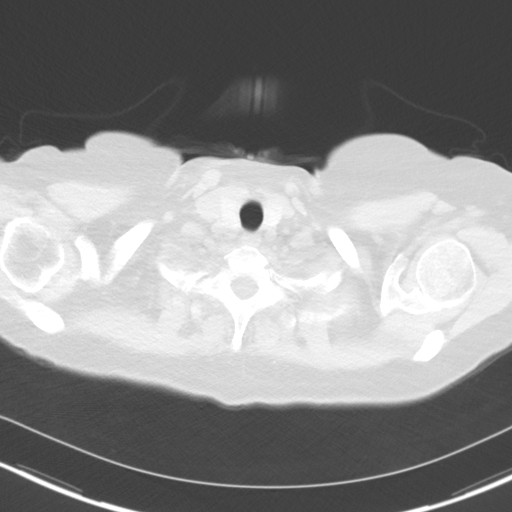

[14 of 40 positions shown; findings below may reference images not displayed]

FINDINGS: Mediastinum/Lymph Nodes: Heart size is normal. There is no
significant pericardial fluid, thickening or pericardial
calcification. There is atherosclerosis of the thoracic aorta, the
great vessels of the mediastinum and the coronary arteries,
including calcified atherosclerotic plaque in the left anterior
descending coronary artery. No pathologically enlarged mediastinal
or hilar lymph nodes. Please note that accurate exclusion of hilar
adenopathy is limited on noncontrast CT scans. Esophagus is
unremarkable in appearance. No axillary lymphadenopathy.

Lungs/Pleura: Tiny pulmonary nodules in the lungs bilaterally, the
largest of which has a volume derived mean diameter of only 2.9 mm
in the medial aspect of the superior segment of the left lower lobe
(image 97 of series 3). No larger more suspicious appearing
pulmonary nodules or masses are noted. Mild diffuse bronchial wall
thickening with mild centrilobular emphysema. Mild bilateral apical
nodular pleuroparenchymal thickening, similar to the prior
examination, most compatible with chronic post infectious or
inflammatory scarring. No acute consolidative airspace disease. No
pleural effusions.

Upper Abdomen: Unremarkable.

Musculoskeletal/Soft Tissues: There are no aggressive appearing
lytic or blastic lesions noted in the visualized portions of the
skeleton.
IMPRESSION: 1. Lung-RADS Category 2S, benign appearance or behavior. Continue
annual screening with low-dose chest CT without contrast in 12
months.
2. The "S" modifier above refers to potentially clinically
significant non lung cancer related findings. Specifically, there is
atherosclerosis, including left anterior descending coronary artery
disease. Please note that although the presence of coronary artery
calcium documents the presence of coronary artery disease, the
severity of this disease and any potential stenosis cannot be
assessed on this non-gated CT examination. Assessment for potential
risk factor modification, dietary therapy or pharmacologic therapy
may be warranted, if clinically indicated.
3. Mild diffuse bronchial wall thickening with mild centrilobular
and paraseptal emphysema; imaging findings suggestive of underlying
COPD.

## 2015-08-14 ENCOUNTER — Telehealth: Payer: Self-pay | Admitting: *Deleted

## 2015-08-14 NOTE — Telephone Encounter (Signed)
Pt does not understand the need for an appt because she was told that her CT was okay. Pt feels that we got this wrong & states that she will be sending you a mychart message. Pt did not want to schedule

## 2015-08-14 NOTE — Telephone Encounter (Signed)
Pt has been notified of CT scan results.  Please schedule an appt for f/u to discuss whether or not any further w/up warranted for the coronary artery findings.   Thanks

## 2015-08-14 NOTE — Telephone Encounter (Signed)
Called pt and explained CT results.  We are treating the cholesterol.  She wants to hold on any further evaluation at this time.  Will notify me if problems

## 2015-08-14 NOTE — Telephone Encounter (Signed)
Notified patient of LDCT lung cancer screening results of Lung Rads 2S finding with recommendation for 12 month follow up imaging. Also notified of incidental finding noted below. Patient verbalizes understanding.   IMPRESSION: 1. Lung-RADS Category 2S, benign appearance or behavior. Continue annual screening with low-dose chest CT without contrast in 12 months. 2. The "S" modifier above refers to potentially clinically significant non lung cancer related findings. Specifically, there is atherosclerosis, including left anterior descending coronary artery disease. Please note that although the presence of coronary artery calcium documents the presence of coronary artery disease, the severity of this disease and any potential stenosis cannot be assessed on this non-gated CT examination. Assessment for potential risk factor modification, dietary therapy or pharmacologic therapy may be warranted, if clinically indicated. 3. Mild diffuse bronchial wall thickening with mild centrilobular and paraseptal emphysema; imaging findings suggestive of underlying COPD.

## 2015-08-14 NOTE — Telephone Encounter (Signed)
Order placed for referral to Dr Kernodle.   

## 2015-08-14 NOTE — Telephone Encounter (Signed)
Pt needs appt to further discuss Coronary artery findings on CT scan.

## 2015-09-03 ENCOUNTER — Other Ambulatory Visit (INDEPENDENT_AMBULATORY_CARE_PROVIDER_SITE_OTHER): Payer: Medicare HMO

## 2015-09-03 ENCOUNTER — Encounter: Payer: Self-pay | Admitting: Internal Medicine

## 2015-09-03 DIAGNOSIS — I2510 Atherosclerotic heart disease of native coronary artery without angina pectoris: Secondary | ICD-10-CM

## 2015-09-03 DIAGNOSIS — E7800 Pure hypercholesterolemia, unspecified: Secondary | ICD-10-CM | POA: Diagnosis not present

## 2015-09-03 LAB — HEPATIC FUNCTION PANEL
ALT: 16 U/L (ref 0–35)
AST: 18 U/L (ref 0–37)
Albumin: 4.3 g/dL (ref 3.5–5.2)
Alkaline Phosphatase: 78 U/L (ref 39–117)
Bilirubin, Direct: 0.1 mg/dL (ref 0.0–0.3)
Total Bilirubin: 0.4 mg/dL (ref 0.2–1.2)
Total Protein: 7.1 g/dL (ref 6.0–8.3)

## 2015-09-04 ENCOUNTER — Encounter: Payer: Self-pay | Admitting: *Deleted

## 2015-09-04 ENCOUNTER — Other Ambulatory Visit: Payer: Self-pay | Admitting: Internal Medicine

## 2015-09-04 DIAGNOSIS — E7800 Pure hypercholesterolemia, unspecified: Secondary | ICD-10-CM

## 2015-09-04 NOTE — Telephone Encounter (Signed)
Order placed for cardiology referral.   

## 2015-09-04 NOTE — Progress Notes (Signed)
Order placed for f/u labs.  

## 2015-09-05 ENCOUNTER — Ambulatory Visit: Payer: Medicare HMO

## 2015-09-07 DIAGNOSIS — R0789 Other chest pain: Secondary | ICD-10-CM | POA: Diagnosis not present

## 2015-09-07 DIAGNOSIS — E7800 Pure hypercholesterolemia, unspecified: Secondary | ICD-10-CM | POA: Diagnosis not present

## 2015-09-07 DIAGNOSIS — J438 Other emphysema: Secondary | ICD-10-CM | POA: Diagnosis not present

## 2015-09-07 DIAGNOSIS — J42 Unspecified chronic bronchitis: Secondary | ICD-10-CM | POA: Diagnosis not present

## 2015-09-11 DIAGNOSIS — R0789 Other chest pain: Secondary | ICD-10-CM | POA: Diagnosis not present

## 2015-09-24 DIAGNOSIS — M810 Age-related osteoporosis without current pathological fracture: Secondary | ICD-10-CM | POA: Diagnosis not present

## 2015-09-28 DIAGNOSIS — M810 Age-related osteoporosis without current pathological fracture: Secondary | ICD-10-CM | POA: Diagnosis not present

## 2015-10-10 ENCOUNTER — Ambulatory Visit
Admission: RE | Admit: 2015-10-10 | Discharge: 2015-10-10 | Disposition: A | Discharge status: Home / Self Care | Payer: Medicare HMO | Source: Ambulatory Visit | Attending: Internal Medicine | Admitting: Internal Medicine

## 2015-10-10 ENCOUNTER — Other Ambulatory Visit: Payer: Self-pay | Admitting: Internal Medicine

## 2015-10-10 DIAGNOSIS — R921 Mammographic calcification found on diagnostic imaging of breast: Secondary | ICD-10-CM | POA: Diagnosis not present

## 2015-10-10 DIAGNOSIS — R928 Other abnormal and inconclusive findings on diagnostic imaging of breast: Secondary | ICD-10-CM | POA: Diagnosis not present

## 2015-10-10 IMAGING — MG MM DIGITAL DIAGNOSTIC BILAT W/ TOMO W/ CAD
8 of 16 series · 8 of 32 positions shown · non-contrast
Comparison: Previous exam(s).

CLINICAL DATA: Patient had a benign stereotactic biopsy of right
breast calcifications [DATE] which showed fibrocystic change and
fibroadenomatoid change with sclerosis and coarse stromal
calcifications. No atypia or malignancy seen.

EXAM:
2D DIGITAL DIAGNOSTIC BILATERAL MAMMOGRAM WITH CAD AND ADJUNCT TOMO

[R ML (1 of 2)]
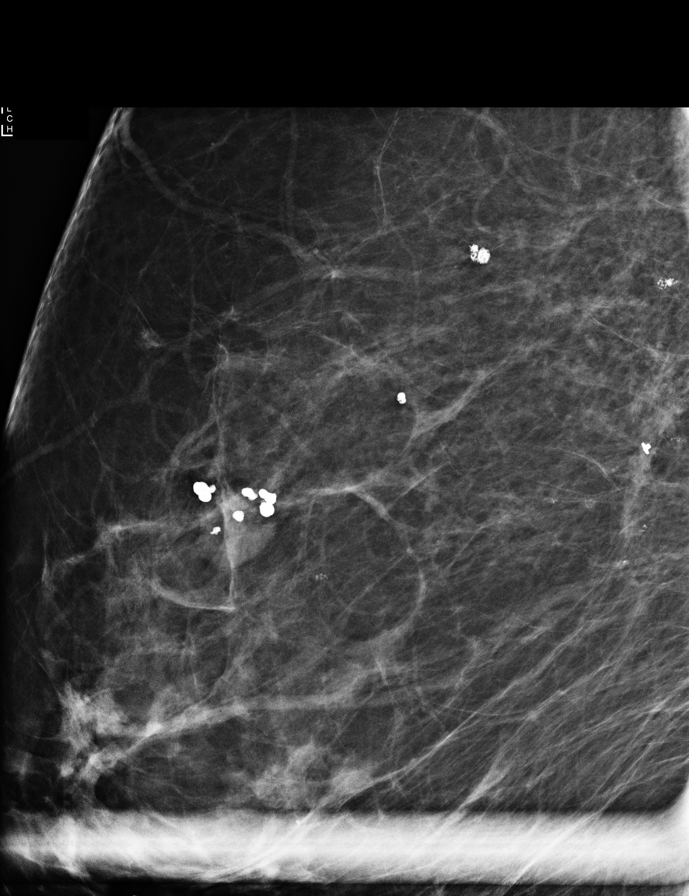

[R CC]
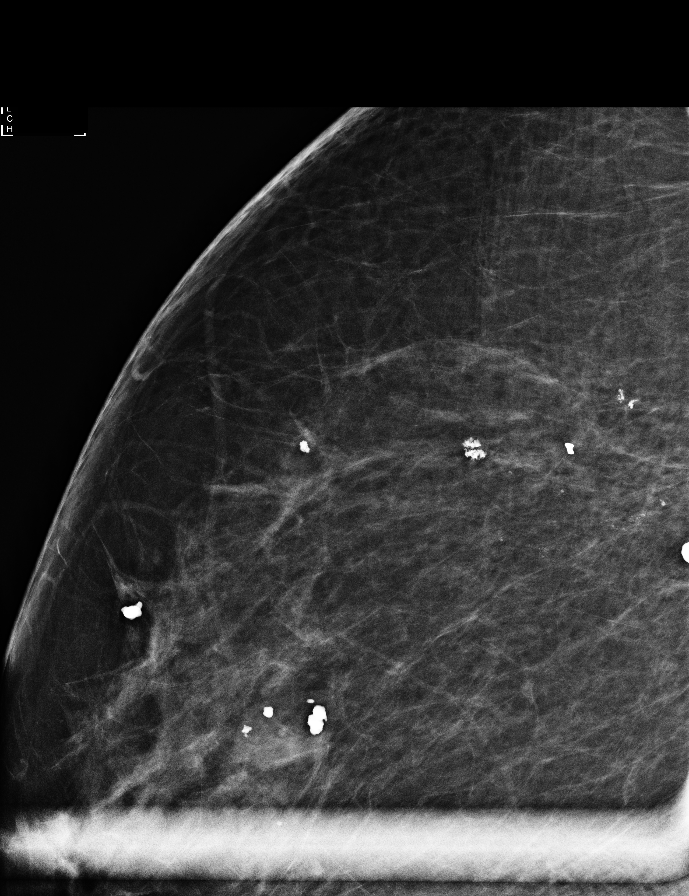

[R ML (2 of 2)]
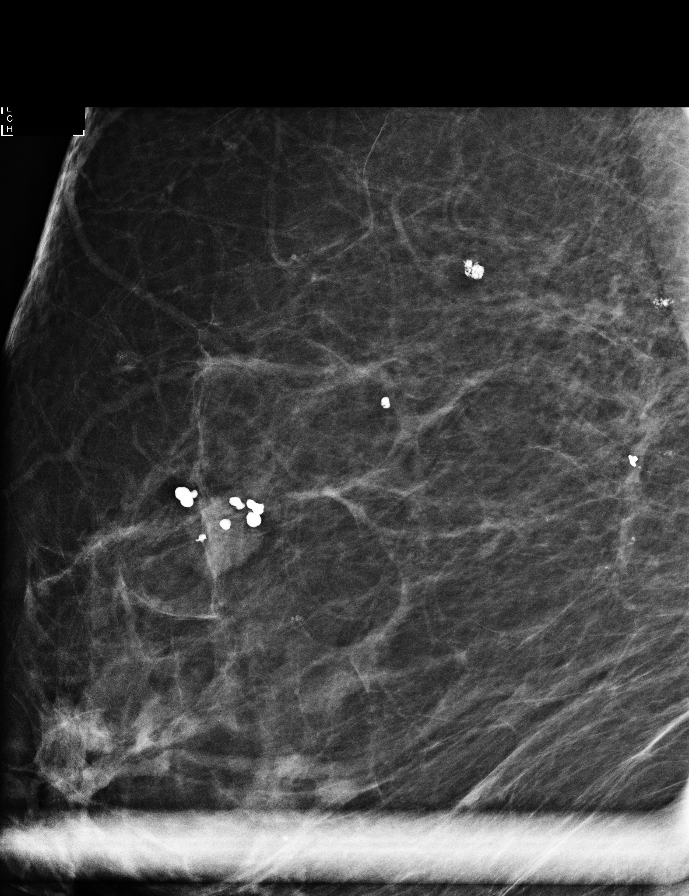

[R MLO (1 of 2)]
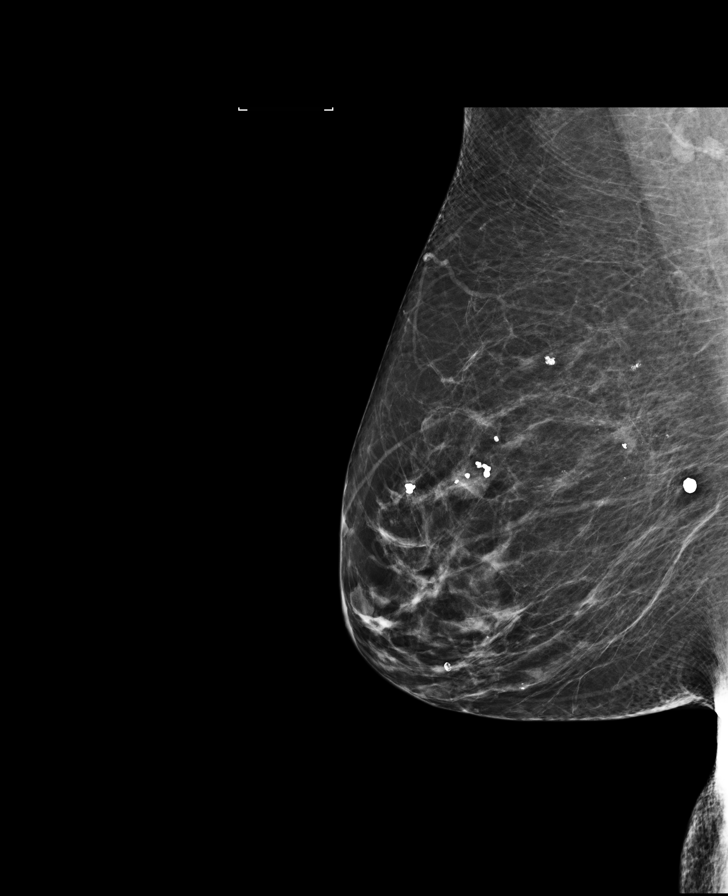

[R MLO (2 of 2)]
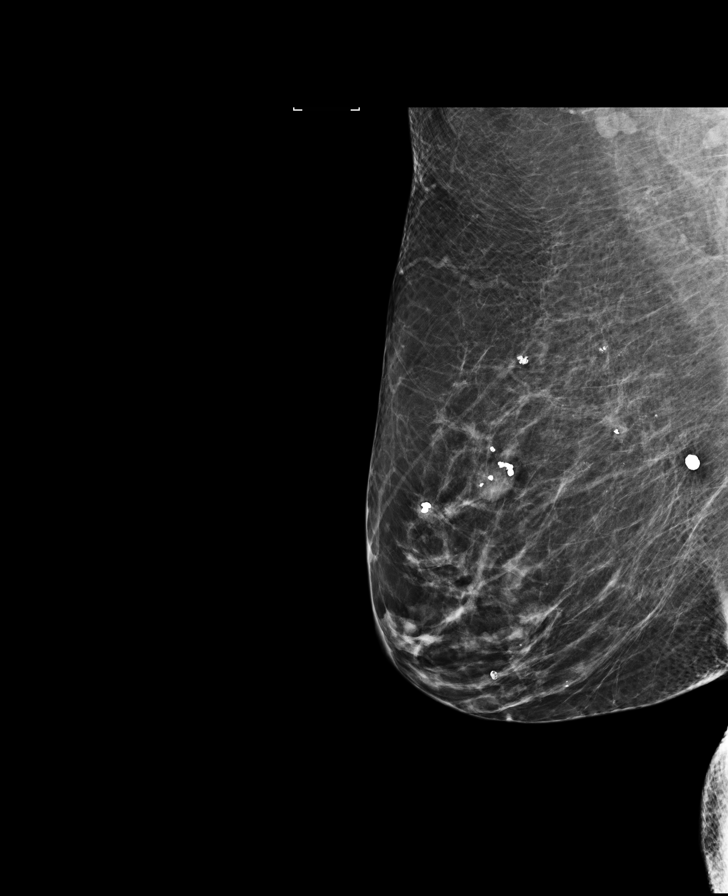

[L CC]
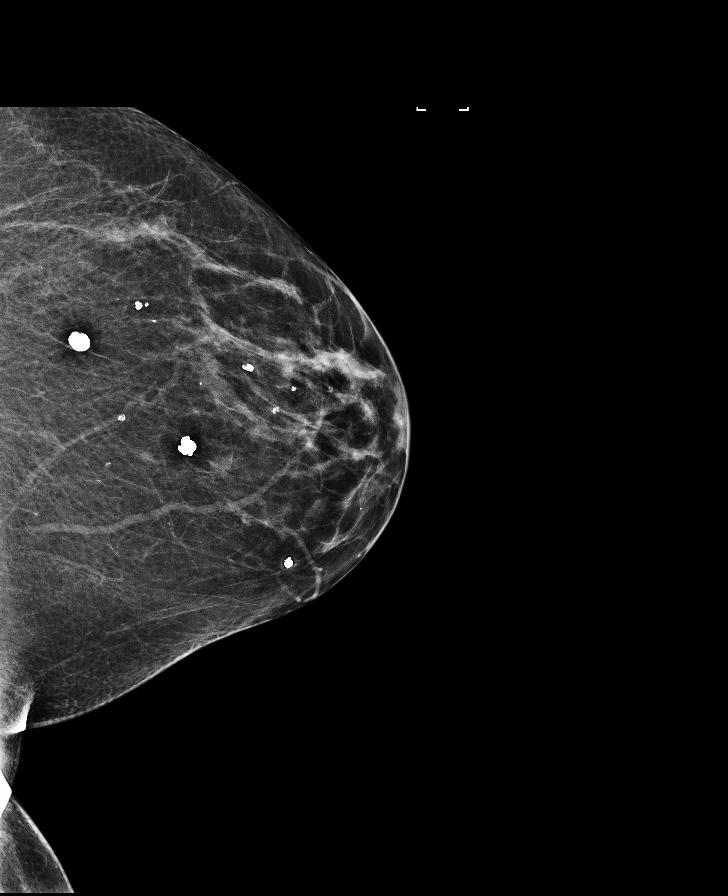

[L CC synth-2D]
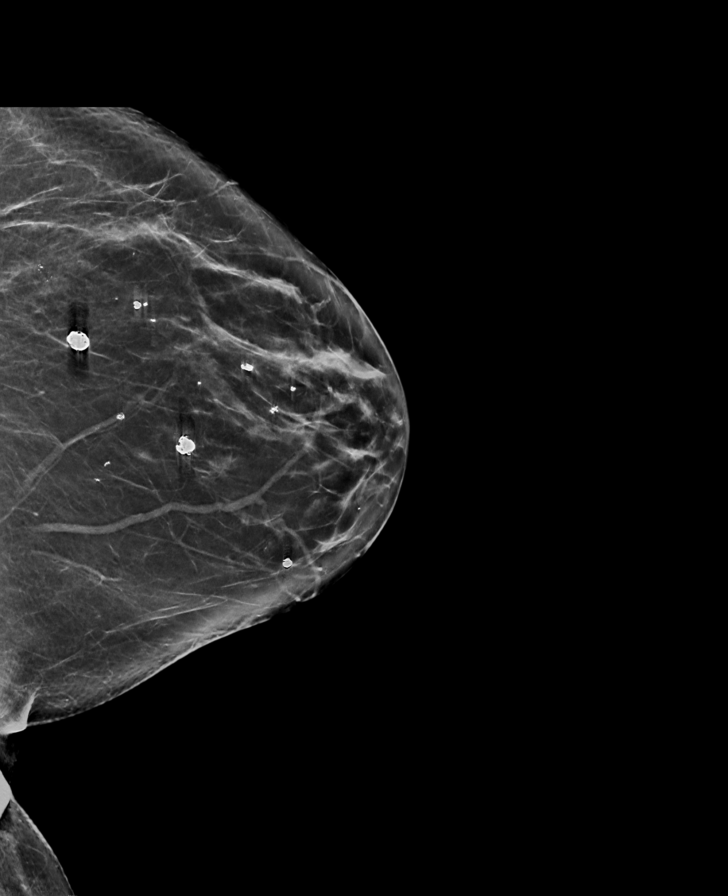

[R CC synth-2D]
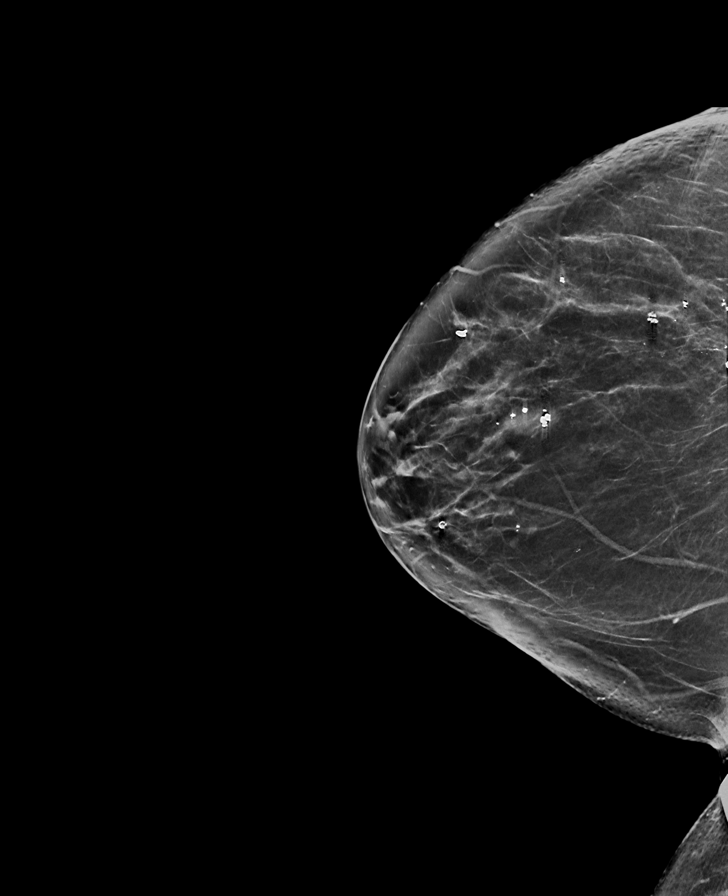

[8 of 32 positions shown; findings below may reference images not displayed]

ACR Breast Density Category b: There are scattered areas of
fibroglandular density.
FINDINGS: Biopsy clip is seen in the upper-outer quadrant of the right breast.
No suspicious mass or malignant type microcalcifications are seen in
either breast.

Mammographic images were processed with CAD.
IMPRESSION: No evidence of malignancy in either breast.

RECOMMENDATION:
Bilateral screening mammogram in 1 year is recommended.

I have discussed the findings and recommendations with the patient.
Results were also provided in writing at the conclusion of the
visit. If applicable, a reminder letter will be sent to the patient
regarding the next appointment.

BI-RADS CATEGORY  2: Benign.

## 2015-10-23 ENCOUNTER — Ambulatory Visit: Payer: Medicare Other | Admitting: General Surgery

## 2015-10-31 ENCOUNTER — Ambulatory Visit: Payer: Medicare Other | Admitting: General Surgery

## 2015-11-13 ENCOUNTER — Ambulatory Visit (INDEPENDENT_AMBULATORY_CARE_PROVIDER_SITE_OTHER): Payer: Medicare HMO | Admitting: General Surgery

## 2015-11-13 ENCOUNTER — Encounter: Payer: Self-pay | Admitting: General Surgery

## 2015-11-13 VITALS — BP 128/66 | HR 76 | Resp 12 | Ht 63.0 in | Wt 190.0 lb

## 2015-11-13 DIAGNOSIS — R928 Other abnormal and inconclusive findings on diagnostic imaging of breast: Secondary | ICD-10-CM | POA: Diagnosis not present

## 2015-11-13 DIAGNOSIS — R921 Mammographic calcification found on diagnostic imaging of breast: Secondary | ICD-10-CM

## 2015-11-13 NOTE — Progress Notes (Signed)
Patient ID: Alexis Reed, female   DOB: [birth year], 70 y.o.   MRN: [id number]  Chief Complaint  Patient presents with  . Follow-up    Mammmogram    HPI [name] is a 70 y.o. female who presents for a breast evaluation. The most recent mammogram was done on 10/10/15 .  Patient does perform regular self breast checks and gets regular mammograms done.  No new breast changes.    HPI  Past Medical History  Diagnosis Date  . Hypercholesterolemia   . Arthritis   . Fibroids     s/p hysterectomy  . Benign breast lumps     multiple lumps biopsy and remove x's 5 last being 1979 by Dr. Sharlet Salina  . Osteoporosis   . Personal history of tobacco use, presenting hazards to health 08/07/2015    Past Surgical History  Procedure Laterality Date  . Abdominal hysterectomy  1997    with bilateral oophorectomy  . Tonsillectomy    . Breast surgery Left     lump removed. Unsure of date  . Breast surgery Right     lump removed. Unsure of date  . Colonoscopy  2010    Dr. Vira Agar  . Breast biopsy Right 1975    multiple biopsies done  . Breast biopsy Left     multiple done  . Breast biopsy Right 2016    stereo    Family History  Problem Relation Age of Onset  . Colon cancer Father   . Stroke Father   . Heart disease Father     myocardial infarction  . Breast cancer Neg Hx     Social History Social History  Substance Use Topics  . Smoking status: Former Smoker -- 1.00 packs/day for 30 years    Types: Cigarettes    Quit date: 04/26/2004  . Smokeless tobacco: Never Used  . Alcohol Use: 0.0 oz/week    0 Standard drinks or equivalent per week     Comment: occassional    Allergies  Allergen Reactions  . Alendronate Other (See Comments)    Caused aching.  Alexis Reed [Ibandronic Acid] Other (See Comments)    Aching   . Codeine Other (See Comments)    Upset stomach  . Fosamax [Alendronate Sodium]     Caused aching  . Lipitor [Atorvastatin] Hives  . Other Other (See  Comments)    Aching  . Penicillins     Facial swelling    Current Outpatient Prescriptions  Medication Sig Dispense Refill  . B Complex Vitamins (VITAMIN B COMPLEX PO) Take 1 tablet by mouth daily.    . Calcium Carbonate-Vitamin D (CALCIUM 600-D) 600-400 MG-UNIT per tablet Take 1 tablet by mouth 2 (two) times daily.    . Cholecalciferol (VITAMIN D) 2000 UNITS tablet Take 2,000 Units by mouth daily.    . Denosumab (PROLIA Beltrami) Inject into the skin. Injection twice a year    . Ipratropium-Albuterol (COMBIVENT RESPIMAT) 20-100 MCG/ACT AERS respimat Inhale 1 puff into the lungs every 6 (six) hours as needed for wheezing or shortness of breath. 4 g 5  . meloxicam (MOBIC) 7.5 MG tablet Take 1 tablet (7.5 mg total) by mouth daily as needed. 30 tablet 1  . omeprazole (PRILOSEC) 20 MG capsule Take 1 capsule (20 mg total) by mouth daily. 90 capsule 2  . rosuvastatin (CRESTOR) 5 MG tablet Take 1 tablet (5 mg total) by mouth daily. 30 tablet 1   No current facility-administered medications for this visit.  Review of Systems Review of Systems  Constitutional: Negative.   Respiratory: Negative.   Cardiovascular: Negative.     Blood pressure 128/66, pulse 76, resp. rate 12, height 5\' 3"  (1.6 m), weight 190 lb (86.183 kg), last menstrual period 07/20/1995.  Physical Exam Physical Exam  Constitutional: She is oriented to person, place, and time. She appears well-developed and well-nourished.  HENT:  Mouth/Throat: Oropharynx is clear and moist.  Eyes: Conjunctivae are normal. No scleral icterus.  Neck: Neck supple.  Cardiovascular: Normal rate, regular rhythm and normal heart sounds.   Pulmonary/Chest: Effort normal and breath sounds normal. Right breast exhibits no inverted nipple, no mass, no nipple discharge, no skin change and no tenderness. Left breast exhibits no inverted nipple, no mass, no nipple discharge, no skin change and no tenderness.  Genitourinary:  5 mm rectal skin tag   Lymphadenopathy:    She has no cervical adenopathy.    She has no axillary adenopathy.  Neurological: She is alert and oriented to person, place, and time.  Skin: Skin is warm and dry.  Psychiatric: Her behavior is normal.    Data Reviewed Bilateral mammograms dated 10/10/2015 were reviewed. Stable microcalcifications. BI-RADS-2.  Assessment    Benign breast exam.    Plan    Annual screening mammograms with her PCP.     Follow up with Dr. Nicki Reaper in one year with a bilateral screening mammogram.   PCP:Dr. Einar Pheasant This information has been scribed by Karie Fetch RN, BSN,BC.    Alexis Reed 11/14/2015, 5:04 PM

## 2015-11-13 NOTE — Patient Instructions (Addendum)
Continue self breast exams. Call office for any new breast issues or concerns. Follow up with Dr. Nicki Reaper in one year with a bilateral screening mammogram.

## 2015-12-06 ENCOUNTER — Other Ambulatory Visit: Payer: Self-pay | Admitting: Internal Medicine

## 2016-01-04 ENCOUNTER — Other Ambulatory Visit: Payer: Self-pay | Admitting: Internal Medicine

## 2016-01-23 ENCOUNTER — Encounter: Payer: Self-pay | Admitting: Internal Medicine

## 2016-01-23 ENCOUNTER — Ambulatory Visit (INDEPENDENT_AMBULATORY_CARE_PROVIDER_SITE_OTHER): Payer: Medicare HMO | Admitting: Internal Medicine

## 2016-01-23 VITALS — BP 110/70 | HR 74 | Temp 97.6°F | Resp 18 | Ht 63.0 in | Wt 187.0 lb

## 2016-01-23 DIAGNOSIS — E669 Obesity, unspecified: Secondary | ICD-10-CM

## 2016-01-23 DIAGNOSIS — Z87891 Personal history of nicotine dependence: Secondary | ICD-10-CM

## 2016-01-23 DIAGNOSIS — Z658 Other specified problems related to psychosocial circumstances: Secondary | ICD-10-CM | POA: Diagnosis not present

## 2016-01-23 DIAGNOSIS — M810 Age-related osteoporosis without current pathological fracture: Secondary | ICD-10-CM

## 2016-01-23 DIAGNOSIS — E7800 Pure hypercholesterolemia, unspecified: Secondary | ICD-10-CM

## 2016-01-23 DIAGNOSIS — J438 Other emphysema: Secondary | ICD-10-CM

## 2016-01-23 DIAGNOSIS — F439 Reaction to severe stress, unspecified: Secondary | ICD-10-CM

## 2016-01-23 MED ORDER — IPRATROPIUM-ALBUTEROL 20-100 MCG/ACT IN AERS: 1.0000 | INHALATION_SPRAY | Freq: Four times a day (QID) | RESPIRATORY_TRACT | Status: DC | PRN

## 2016-01-23 NOTE — Assessment & Plan Note (Signed)
Diet and exercise.   

## 2016-01-23 NOTE — Progress Notes (Signed)
Patient ID: Alexis Reed, female   DOB: Nov 26, 1945, 70 y.o.   MRN: [id number]   Subjective:    Patient ID: Alexis Reed, female    DOB: Nov 21, 1945, 70 y.o.   MRN: [id number]  HPI  Patient here for a scheduled follow up.  She is doing well.  Some increased stress with her mother, but overall doing well.  Feels good.  Discussed exercise.  She is watching what she eats..  On crestor and tolerating.  No chest pain.  Breathing stable.  Uses inhaler - rarely.  No acid reflux.  No abdominal pain or cramping.  Bowels stable.  Had colonoscopy 11/2014.  States one polyp found.  Need results.  Received prolia 09/2015.  Tolerated.     Past Medical History  Diagnosis Date  . Hypercholesterolemia   . Arthritis   . Fibroids     s/p hysterectomy  . Benign breast lumps     multiple lumps biopsy and remove x's 5 last being 1979 by Dr. Sharlet Salina  . Osteoporosis   . Personal history of tobacco use, presenting hazards to health 08/07/2015   Past Surgical History  Procedure Laterality Date  . Abdominal hysterectomy  1997    with bilateral oophorectomy  . Tonsillectomy    . Breast surgery Left     lump removed. Unsure of date  . Breast surgery Right     lump removed. Unsure of date  . Colonoscopy  2010    Dr. Vira Agar  . Breast biopsy Right 1975    multiple biopsies done  . Breast biopsy Left     multiple done  . Breast biopsy Right 2016    stereo   Family History  Problem Relation Age of Onset  . Colon cancer Father   . Stroke Father   . Heart disease Father     myocardial infarction  . Breast cancer Neg Hx    Social History   Social History  . Marital Status: Married    Spouse Name: N/A  . Number of Children: 2  . Years of Education: N/A   Social History Main Topics  . Smoking status: Former Smoker -- 1.00 packs/day for 30 years    Types: Cigarettes    Quit date: 04/26/2004  . Smokeless tobacco: Never Used  . Alcohol Use: 0.0 oz/week    0 Standard drinks or equivalent per  week     Comment: occassional  . Drug Use: No  . Sexual Activity: Not Asked   Other Topics Concern  . None   Social History Narrative   She is married, has two children. Works at [employer]    Outpatient Encounter Prescriptions as of 01/23/2016  Medication Sig  . B Complex Vitamins (VITAMIN B COMPLEX PO) Take 1 tablet by mouth daily.  . Calcium Carbonate-Vitamin D (CALCIUM 600-D) 600-400 MG-UNIT per tablet Take 1 tablet by mouth 2 (two) times daily.  . Cholecalciferol (VITAMIN D) 2000 UNITS tablet Take 2,000 Units by mouth daily.  . Denosumab (PROLIA [place]) Inject into the skin. Injection twice a year  . Ipratropium-Albuterol (COMBIVENT RESPIMAT) 20-100 MCG/ACT AERS respimat Inhale 1 puff into the lungs every 6 (six) hours as needed for wheezing or shortness of breath.  . meloxicam (MOBIC) 7.5 MG tablet Take 1 tablet (7.5 mg total) by mouth daily as needed.  Marland Kitchen omeprazole (PRILOSEC) 20 MG capsule TAKE 1 CAPSULE BY MOUTH ONCE DAILY  . rosuvastatin (CRESTOR) 5 MG tablet TAKE 1 TABLET BY MOUTH  ONCE DAILY.  . [DISCONTINUED] Ipratropium-Albuterol (COMBIVENT RESPIMAT) 20-100 MCG/ACT AERS respimat Inhale 1 puff into the lungs every 6 (six) hours as needed for wheezing or shortness of breath.   No facility-administered encounter medications on file as of 01/23/2016.    Review of Systems  Constitutional: Negative for appetite change and unexpected weight change.  HENT: Negative for congestion and sinus pressure.   Respiratory: Negative for cough, chest tightness and shortness of breath.   Cardiovascular: Negative for chest pain, palpitations and leg swelling.  Gastrointestinal: Negative for nausea, vomiting, abdominal pain and diarrhea.  Genitourinary: Negative for dysuria and difficulty urinating.  Musculoskeletal: Negative for back pain and joint swelling.  Skin: Negative for color change and rash.  Neurological: Negative for dizziness, light-headedness and headaches.   Psychiatric/Behavioral: Negative for dysphoric mood and agitation.       Objective:     Blood pressure rechecked by me:  126/74  Physical Exam  Constitutional: She appears well-developed and well-nourished. No distress.  HENT:  Nose: Nose normal.  Mouth/Throat: Oropharynx is clear and moist.  Neck: Neck supple. No thyromegaly present.  Cardiovascular: Normal rate and regular rhythm.   Pulmonary/Chest: Breath sounds normal. No respiratory distress. She has no wheezes.  Abdominal: Soft. Bowel sounds are normal. There is no tenderness.  Musculoskeletal: She exhibits no edema or tenderness.  Lymphadenopathy:    She has no cervical adenopathy.  Skin: No rash noted. No erythema.  Psychiatric: She has a normal mood and affect. Her behavior is normal.    BP 110/70 mmHg  Pulse 74  Temp(Src) 97.6 F (36.4 C) (Oral)  Resp 18  Ht 5\' 3"  (1.6 m)  Wt 187 lb (84.823 kg)  BMI 33.13 kg/m2  SpO2 94%  LMP 07/20/1995 Wt Readings from Last 3 Encounters:  01/23/16 187 lb (84.823 kg)  11/13/15 190 lb (86.183 kg)  08/13/15 185 lb (83.915 kg)     Lab Results  Component Value Date   WBC 5.1 07/17/2015   HGB 14.5 07/17/2015   HCT 44.2 07/17/2015   PLT 285.0 07/17/2015   GLUCOSE 86 07/17/2015   CHOL 241* 07/17/2015   TRIG 211.0* 07/17/2015   HDL 50.10 07/17/2015   LDLDIRECT 153.0 07/17/2015   LDLCALC 127* 11/29/2014   ALT 16 09/03/2015   AST 18 09/03/2015   NA 141 07/17/2015   K 4.6 07/17/2015   CL 104 07/17/2015   CREATININE 0.55 07/17/2015   BUN 15 07/17/2015   CO2 31 07/17/2015   TSH 1.81 07/17/2015    Mm Diag Breast Tomo Bilateral  10/10/2015  CLINICAL DATA:  Patient had a benign stereotactic biopsy of right breast calcifications 10/23/2014 which showed fibrocystic change and fibroadenomatoid change with sclerosis and coarse stromal calcifications. No atypia or malignancy seen. EXAM: 2D DIGITAL DIAGNOSTIC BILATERAL MAMMOGRAM WITH CAD AND ADJUNCT TOMO COMPARISON:  Previous  exam(s). ACR Breast Density Category b: There are scattered areas of fibroglandular density. FINDINGS: Biopsy clip is seen in the upper-outer quadrant of the right breast. No suspicious mass or malignant type microcalcifications are seen in either breast. Mammographic images were processed with CAD. IMPRESSION: No evidence of malignancy in either breast. RECOMMENDATION: Bilateral screening mammogram in 1 year is recommended. I have discussed the findings and recommendations with the patient. Results were also provided in writing at the conclusion of the visit. If applicable, a reminder letter will be sent to the patient regarding the next appointment. BI-RADS CATEGORY  2: Benign. Electronically Signed   By: Georgiana Shore.D.  On: 10/10/2015 11:47       Assessment & Plan:   Problem List Items Addressed This Visit    GOLD GRADE A COPD with Centrilobular Emphysema    Breathing stable.  Refilled combivent.       Relevant Medications   Ipratropium-Albuterol (COMBIVENT RESPIMAT) 20-100 MCG/ACT AERS respimat   Hypercholesterolemia    Tolerating crestor.  Check lipid panel and liver function tests.        Obesity (BMI 30-39.9)    Diet and exercise.        Osteoporosis    Tolerated prolia.  Continue.       Personal history of tobacco use, presenting hazards to health - Primary    Screening CT as outlined.  Due to get another CT in one year from previous.        Stress    Doing well.  Follow.           Einar Pheasant, MD

## 2016-01-23 NOTE — Assessment & Plan Note (Signed)
Doing well.  Follow.  

## 2016-01-23 NOTE — Assessment & Plan Note (Signed)
Tolerating crestor.  Check lipid panel and liver function tests.

## 2016-01-23 NOTE — Assessment & Plan Note (Signed)
Breathing stable.  Refilled combivent.

## 2016-01-23 NOTE — Progress Notes (Signed)
Pre-visit discussion using our clinic review tool. No additional management support is needed unless otherwise documented below in the visit note.  

## 2016-01-23 NOTE — Assessment & Plan Note (Signed)
Tolerated prolia.  Continue.

## 2016-01-23 NOTE — Assessment & Plan Note (Signed)
Screening CT as outlined.  Due to get another CT in one year from previous.

## 2016-01-29 ENCOUNTER — Other Ambulatory Visit (INDEPENDENT_AMBULATORY_CARE_PROVIDER_SITE_OTHER): Payer: Medicare HMO

## 2016-01-29 DIAGNOSIS — E7800 Pure hypercholesterolemia, unspecified: Secondary | ICD-10-CM | POA: Diagnosis not present

## 2016-01-29 LAB — LIPID PANEL
Cholesterol: 210 mg/dL — ABNORMAL HIGH (ref 0–200)
HDL: 52.7 mg/dL (ref >39.00)
LDL Cholesterol: 132 mg/dL — ABNORMAL HIGH (ref 0–99)
NonHDL: 157.52
Total CHOL/HDL Ratio: 4
Triglycerides: 129 mg/dL (ref 0.0–149.0)
VLDL: 25.8 mg/dL (ref 0.0–40.0)

## 2016-01-29 LAB — HEPATIC FUNCTION PANEL
ALT: 12 U/L (ref 0–35)
AST: 14 U/L (ref 0–37)
Albumin: 4.3 g/dL (ref 3.5–5.2)
Alkaline Phosphatase: 77 U/L (ref 39–117)
Bilirubin, Direct: 0.1 mg/dL (ref 0.0–0.3)
Total Bilirubin: 0.5 mg/dL (ref 0.2–1.2)
Total Protein: 7.4 g/dL (ref 6.0–8.3)

## 2016-01-29 LAB — BASIC METABOLIC PANEL
BUN: 14 mg/dL (ref 6–23)
CO2: 29 mEq/L (ref 19–32)
Calcium: 9.6 mg/dL (ref 8.4–10.5)
Chloride: 103 mEq/L (ref 96–112)
Creatinine, Ser: 0.62 mg/dL (ref 0.40–1.20)
GFR: 101.77 mL/min (ref >60.00)
Glucose, Bld: 92 mg/dL (ref 70–99)
Potassium: 4.3 mEq/L (ref 3.5–5.1)
Sodium: 138 mEq/L (ref 135–145)

## 2016-01-30 ENCOUNTER — Other Ambulatory Visit: Payer: Medicare HMO

## 2016-01-31 ENCOUNTER — Encounter: Payer: Self-pay | Admitting: Internal Medicine

## 2016-01-31 ENCOUNTER — Other Ambulatory Visit: Payer: Medicare HMO

## 2016-02-04 ENCOUNTER — Encounter: Payer: Self-pay | Admitting: Internal Medicine

## 2016-02-04 DIAGNOSIS — Z86010 Personal history of colonic polyps: Secondary | ICD-10-CM | POA: Insufficient documentation

## 2016-03-31 DIAGNOSIS — M810 Age-related osteoporosis without current pathological fracture: Secondary | ICD-10-CM | POA: Diagnosis not present

## 2016-06-25 DIAGNOSIS — Z1283 Encounter for screening for malignant neoplasm of skin: Secondary | ICD-10-CM | POA: Diagnosis not present

## 2016-06-25 DIAGNOSIS — L570 Actinic keratosis: Secondary | ICD-10-CM | POA: Diagnosis not present

## 2016-06-25 DIAGNOSIS — Z808 Family history of malignant neoplasm of other organs or systems: Secondary | ICD-10-CM | POA: Diagnosis not present

## 2016-06-25 DIAGNOSIS — Z09 Encounter for follow-up examination after completed treatment for conditions other than malignant neoplasm: Secondary | ICD-10-CM | POA: Diagnosis not present

## 2016-06-25 DIAGNOSIS — Z872 Personal history of diseases of the skin and subcutaneous tissue: Secondary | ICD-10-CM | POA: Diagnosis not present

## 2016-07-25 ENCOUNTER — Telehealth: Payer: Self-pay | Admitting: Radiology

## 2016-07-25 ENCOUNTER — Telehealth: Payer: Self-pay | Admitting: *Deleted

## 2016-07-25 DIAGNOSIS — E7800 Pure hypercholesterolemia, unspecified: Secondary | ICD-10-CM

## 2016-07-25 DIAGNOSIS — M810 Age-related osteoporosis without current pathological fracture: Secondary | ICD-10-CM

## 2016-07-25 DIAGNOSIS — Z87891 Personal history of nicotine dependence: Secondary | ICD-10-CM

## 2016-07-25 NOTE — Addendum Note (Signed)
Addended by: Alisa Graff on: 07/25/2016 08:34 AM   Modules accepted: Orders

## 2016-07-25 NOTE — Telephone Encounter (Signed)
Pt coming in for labs Monday, please place orders. Thank you.

## 2016-07-25 NOTE — Telephone Encounter (Signed)
Orders placed for labs

## 2016-07-25 NOTE — Telephone Encounter (Signed)
Notified patient that annual lung cancer screening low dose CT scan is due. Confirmed that patient is within the age range of 55-77, and asymptomatic, (no signs or symptoms of lung cancer). Patient denies illness that would prevent curative treatment for lung cancer if found. The patient is a former smoker, quit 2006, with a 30 pack year history. The shared decision making visit was done 06/14/14. Patient is agreeable for CT scan being scheduled.

## 2016-07-28 ENCOUNTER — Other Ambulatory Visit (INDEPENDENT_AMBULATORY_CARE_PROVIDER_SITE_OTHER): Payer: Medicare HMO

## 2016-07-28 DIAGNOSIS — M810 Age-related osteoporosis without current pathological fracture: Secondary | ICD-10-CM | POA: Diagnosis not present

## 2016-07-28 DIAGNOSIS — E7800 Pure hypercholesterolemia, unspecified: Secondary | ICD-10-CM | POA: Diagnosis not present

## 2016-07-28 LAB — CBC WITH DIFFERENTIAL/PLATELET
Basophils Absolute: 0.1 10*3/uL (ref 0.0–0.1)
Basophils Relative: 1.9 % (ref 0.0–3.0)
Eosinophils Absolute: 0.2 10*3/uL (ref 0.0–0.7)
Eosinophils Relative: 3.4 % (ref 0.0–5.0)
HCT: 41.8 % (ref 36.0–46.0)
Hemoglobin: 13.8 g/dL (ref 12.0–15.0)
Lymphocytes Relative: 27.1 % (ref 12.0–46.0)
Lymphs Abs: 1.5 10*3/uL (ref 0.7–4.0)
MCHC: 33.1 g/dL (ref 30.0–36.0)
MCV: 91.2 fl (ref 78.0–100.0)
Monocytes Absolute: 0.4 10*3/uL (ref 0.1–1.0)
Monocytes Relative: 7.9 % (ref 3.0–12.0)
Neutro Abs: 3.3 10*3/uL (ref 1.4–7.7)
Neutrophils Relative %: 59.7 % (ref 43.0–77.0)
Platelets: 283 10*3/uL (ref 150.0–400.0)
RBC: 4.58 Mil/uL (ref 3.87–5.11)
RDW: 14.1 % (ref 11.5–15.5)
WBC: 5.6 10*3/uL (ref 4.0–10.5)

## 2016-07-28 LAB — TSH: TSH: 1.64 u[IU]/mL (ref 0.35–4.50)

## 2016-07-28 LAB — LIPID PANEL
Cholesterol: 185 mg/dL (ref 0–200)
HDL: 57.3 mg/dL (ref >39.00)
LDL Cholesterol: 100 mg/dL — ABNORMAL HIGH (ref 0–99)
NonHDL: 127.48
Total CHOL/HDL Ratio: 3
Triglycerides: 135 mg/dL (ref 0.0–149.0)
VLDL: 27 mg/dL (ref 0.0–40.0)

## 2016-07-28 LAB — HEPATIC FUNCTION PANEL
ALT: 14 U/L (ref 0–35)
AST: 13 U/L (ref 0–37)
Albumin: 4.1 g/dL (ref 3.5–5.2)
Alkaline Phosphatase: 66 U/L (ref 39–117)
Bilirubin, Direct: 0.1 mg/dL (ref 0.0–0.3)
Total Bilirubin: 0.4 mg/dL (ref 0.2–1.2)
Total Protein: 6.7 g/dL (ref 6.0–8.3)

## 2016-07-28 LAB — BASIC METABOLIC PANEL
BUN: 13 mg/dL (ref 6–23)
CO2: 28 mEq/L (ref 19–32)
Calcium: 9.3 mg/dL (ref 8.4–10.5)
Chloride: 105 mEq/L (ref 96–112)
Creatinine, Ser: 0.58 mg/dL (ref 0.40–1.20)
GFR: 109.75 mL/min (ref >60.00)
Glucose, Bld: 92 mg/dL (ref 70–99)
Potassium: 4.4 mEq/L (ref 3.5–5.1)
Sodium: 141 mEq/L (ref 135–145)

## 2016-07-28 LAB — VITAMIN D 25 HYDROXY (VIT D DEFICIENCY, FRACTURES): VITD: 26.97 ng/mL — ABNORMAL LOW (ref 30.00–100.00)

## 2016-07-29 ENCOUNTER — Encounter: Payer: Self-pay | Admitting: Internal Medicine

## 2016-07-30 ENCOUNTER — Encounter: Payer: Medicare HMO | Admitting: Internal Medicine

## 2016-08-09 ENCOUNTER — Encounter: Payer: Self-pay | Admitting: Internal Medicine

## 2016-08-09 DIAGNOSIS — Z1239 Encounter for other screening for malignant neoplasm of breast: Secondary | ICD-10-CM

## 2016-08-15 ENCOUNTER — Ambulatory Visit
Admission: RE | Admit: 2016-08-15 | Discharge: 2016-08-15 | Disposition: A | Discharge status: Home / Self Care | Payer: Medicare HMO | Source: Ambulatory Visit | Attending: Oncology | Admitting: Oncology

## 2016-08-15 DIAGNOSIS — Z122 Encounter for screening for malignant neoplasm of respiratory organs: Secondary | ICD-10-CM | POA: Diagnosis not present

## 2016-08-15 DIAGNOSIS — Z87891 Personal history of nicotine dependence: Secondary | ICD-10-CM | POA: Insufficient documentation

## 2016-08-15 DIAGNOSIS — I700 Atherosclerosis of aorta: Secondary | ICD-10-CM | POA: Diagnosis not present

## 2016-08-15 DIAGNOSIS — J432 Centrilobular emphysema: Secondary | ICD-10-CM | POA: Diagnosis not present

## 2016-08-15 DIAGNOSIS — R918 Other nonspecific abnormal finding of lung field: Secondary | ICD-10-CM | POA: Diagnosis not present

## 2016-08-15 DIAGNOSIS — I2510 Atherosclerotic heart disease of native coronary artery without angina pectoris: Secondary | ICD-10-CM | POA: Insufficient documentation

## 2016-08-15 IMAGING — CT CT CHEST LUNG CANCER SCREENING LOW DOSE W/O CM
1 of 2 series · 15 of 40 positions shown, 19 images · non-contrast
Comparison: [DATE]

CLINICAL DATA: Asymptomatic smoker, quitting 12 years ago. Thirty
pack-year history.

EXAM:
CT CHEST WITHOUT CONTRAST LOW-DOSE FOR LUNG CANCER SCREENING
TECHNIQUE: Multidetector CT imaging of the chest was performed following the
standard protocol without IV contrast.

[Series 2: axial st · axial · 0.67mm/px · z∈[-599,-314]mm · 15 of 63 slices shown, 19 images]
[im 3/63  mediastinal]
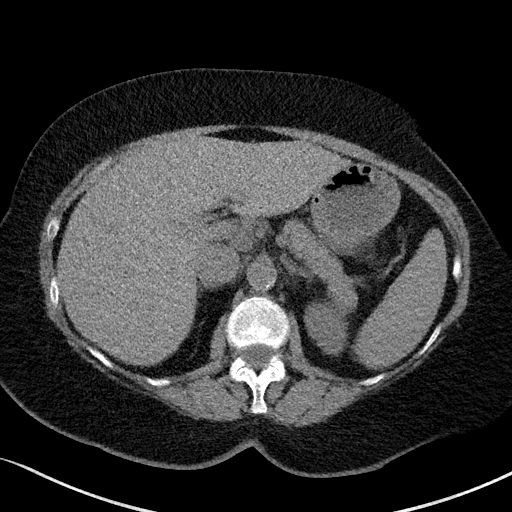
[im 3/63  lung]
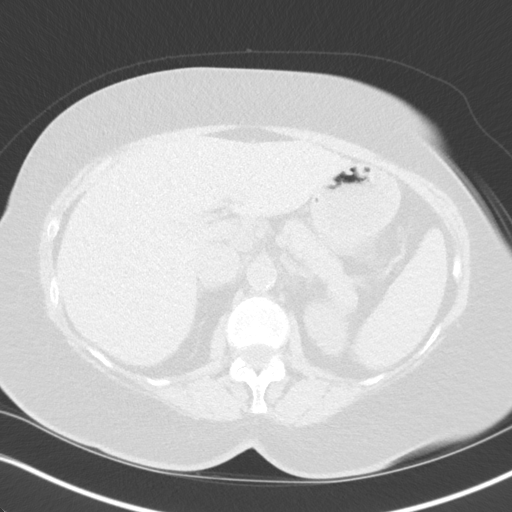
[im 8/63  lung]
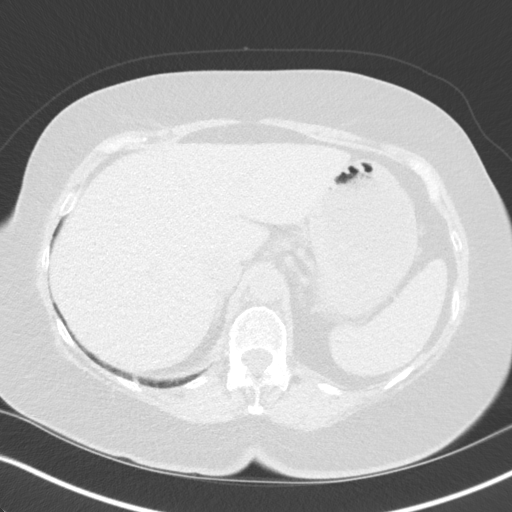
[im 12/63  lung]
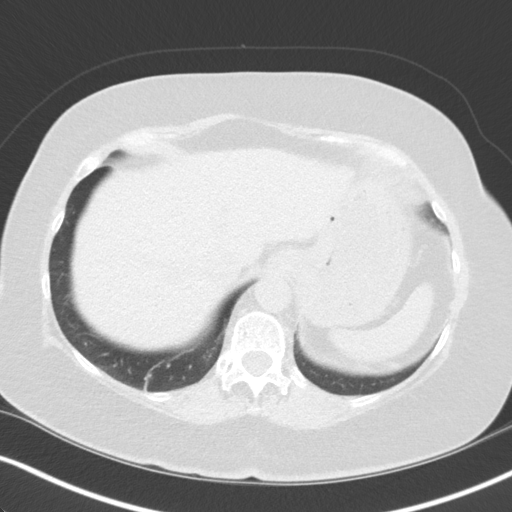
[im 16/63  lung]
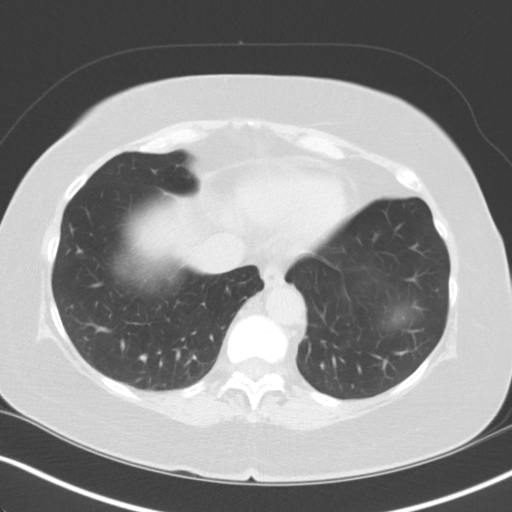
[im 20/63  mediastinal]
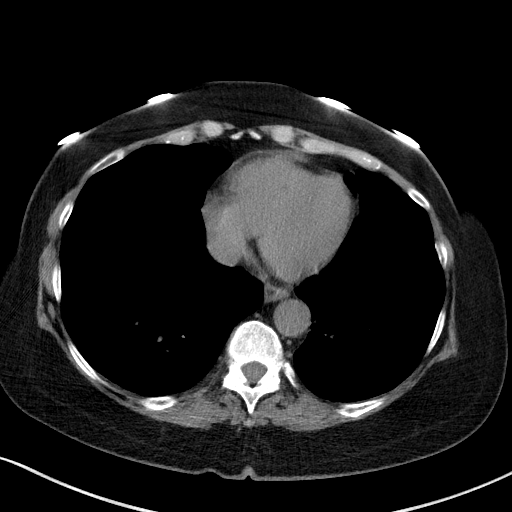
[im 20/63  lung]
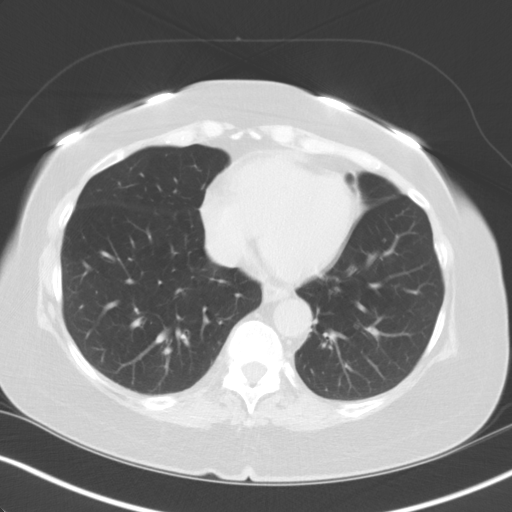
[im 24/63  lung]
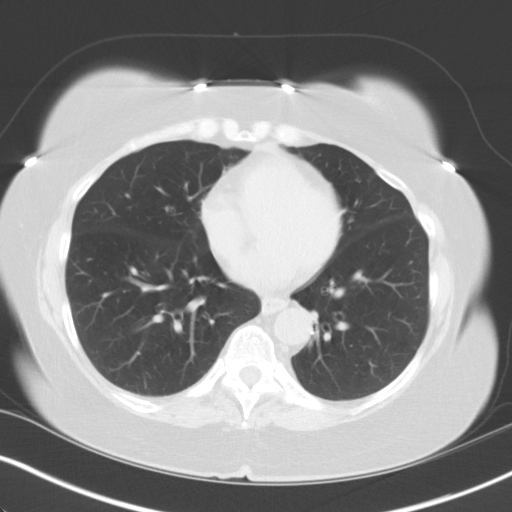
[im 29/63  lung]
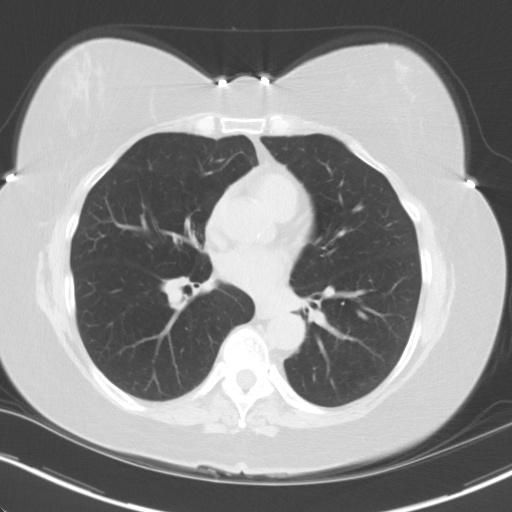
[im 32/63  lung]
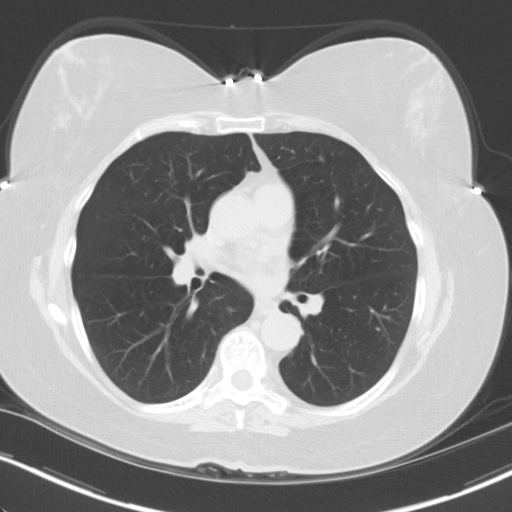
[im 34/63  mediastinal]
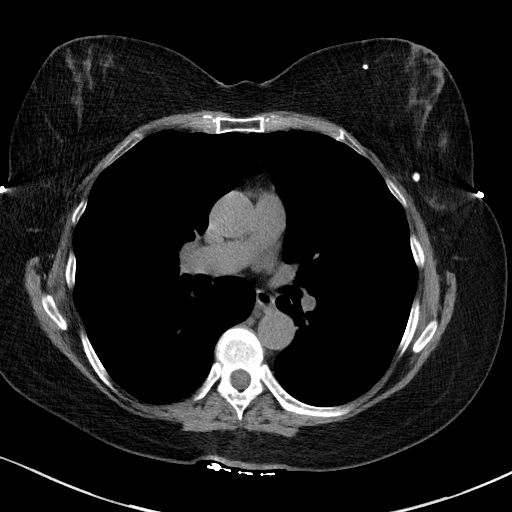
[im 34/63  lung]
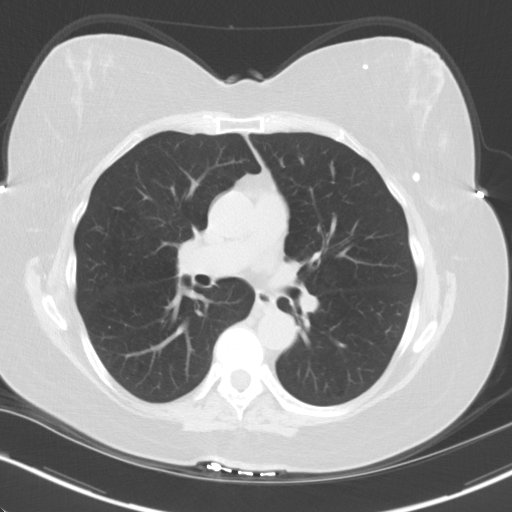
[im 39/63  lung]
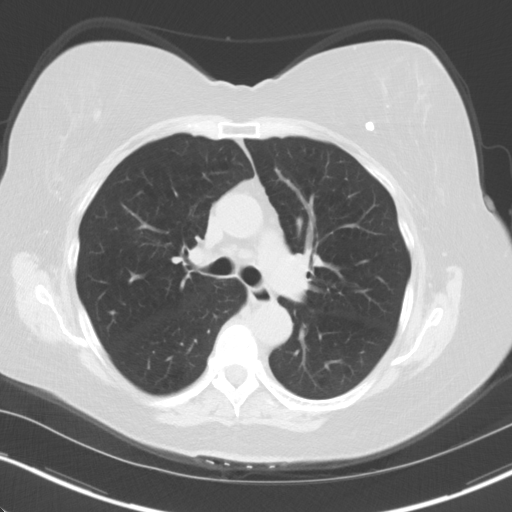
[im 43/63  lung]
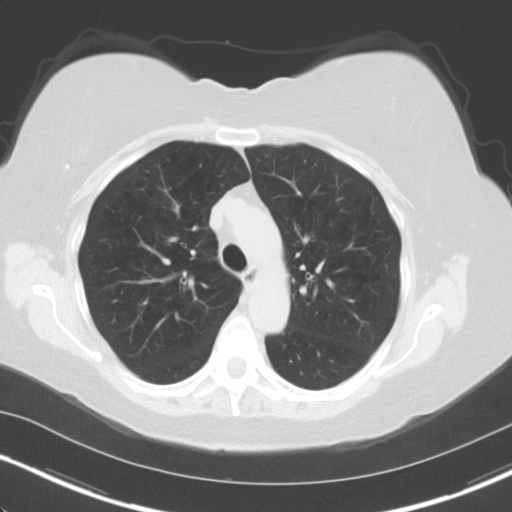
[im 47/63  lung]
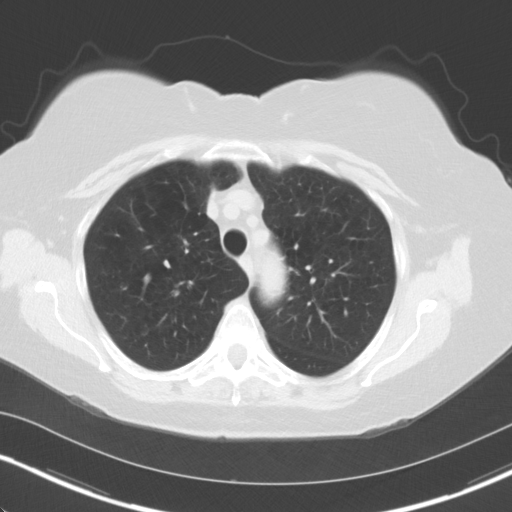
[im 51/63  mediastinal]
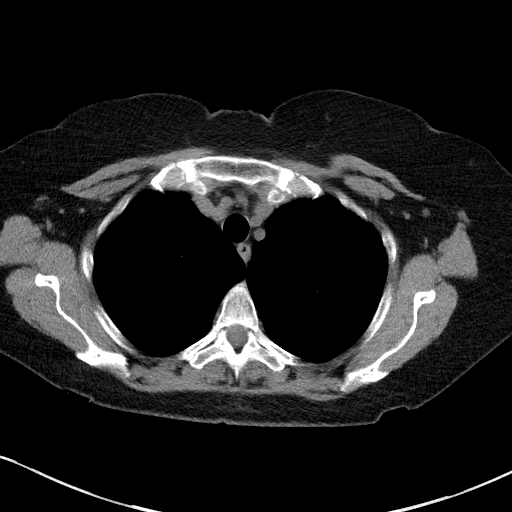
[im 51/63  lung]
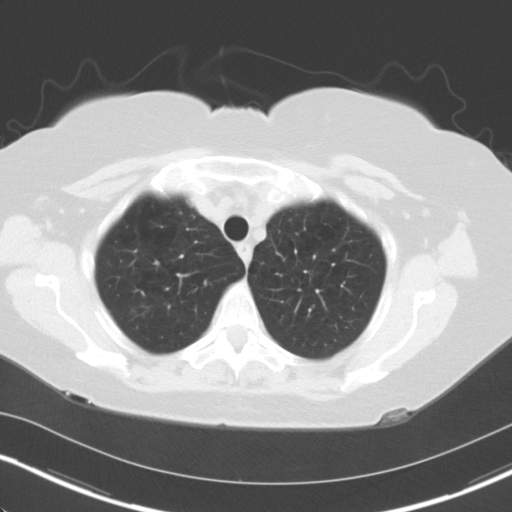
[im 55/63  lung]
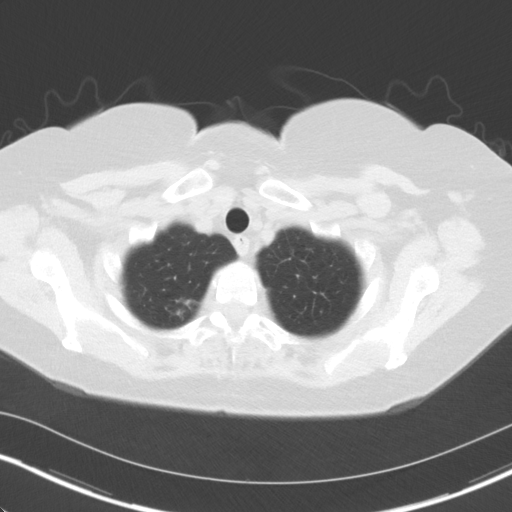
[im 60/63  lung]
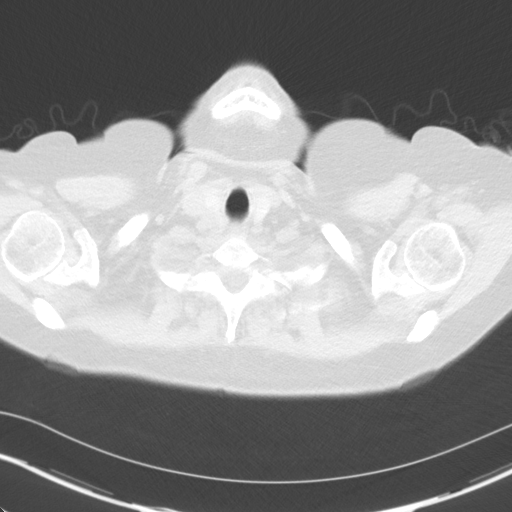

[15 of 40 positions shown; findings below may reference images not displayed]

FINDINGS: Cardiovascular: Aortic atherosclerosis. Tortuous thoracic aorta.
Normal heart size with minimal anterior pericardial fluid or
thickening, likely physiologic. Lad coronary artery atherosclerosis.

Mediastinum/Nodes: No mediastinal or definite hilar adenopathy,
given limitations of unenhanced CT.

Lungs/Pleura: Moderate centrilobular emphysema. Biapical
pleural-parenchymal scarring. Bilateral pulmonary nodules are
similar. There is a left apical nodule which measures volume derived
equivalent diameter 3.8 mm, including on image 51/series 3. This is
new, and surrounded by other smaller pulmonary nodules primarily
superiorly.

Upper Abdomen: Normal imaged portions of the liver, spleen, stomach,
pancreas, adrenal glands, kidneys.

Musculoskeletal: No acute osseous abnormality.
IMPRESSION: 1. Lung-RADS Category 2, benign appearance or behavior. Continue
annual screening with low-dose chest CT without contrast in 12
months.
2. Centrilobular emphysema with bilateral pulmonary nodules. New
left apical clustered nodularity could be post infectious or
inflammatory. Correlate with interval infectious symptoms.
3.  Coronary artery atherosclerosis. Aortic atherosclerosis.

## 2016-08-15 NOTE — Telephone Encounter (Signed)
Caryl Pina, hold this message.  I will work her in somewhere next week.    Dr Nicki Reaper

## 2016-08-16 NOTE — Telephone Encounter (Signed)
I am going to have to work her in the week of 08/18/16.  Please hold and see if work in.  If no work in appt, I will have to work her in at the end of the week.  Thanks    Dr Nicki Reaper

## 2016-08-20 ENCOUNTER — Ambulatory Visit (INDEPENDENT_AMBULATORY_CARE_PROVIDER_SITE_OTHER): Payer: Medicare HMO | Admitting: Internal Medicine

## 2016-08-20 ENCOUNTER — Encounter: Payer: Self-pay | Admitting: Internal Medicine

## 2016-08-20 VITALS — BP 120/60 | HR 61 | Temp 98.6°F | Wt 195.6 lb

## 2016-08-20 DIAGNOSIS — R69 Illness, unspecified: Secondary | ICD-10-CM | POA: Diagnosis not present

## 2016-08-20 DIAGNOSIS — E7800 Pure hypercholesterolemia, unspecified: Secondary | ICD-10-CM

## 2016-08-20 DIAGNOSIS — F17200 Nicotine dependence, unspecified, uncomplicated: Secondary | ICD-10-CM | POA: Diagnosis not present

## 2016-08-20 DIAGNOSIS — N644 Mastodynia: Secondary | ICD-10-CM

## 2016-08-20 MED ORDER — ROSUVASTATIN CALCIUM 5 MG PO TABS: 5.0000 mg | ORAL_TABLET | Freq: Every day | ORAL | 1 refills | Status: DC

## 2016-08-20 MED ORDER — OMEPRAZOLE 20 MG PO CPDR: 20.0000 mg | DELAYED_RELEASE_CAPSULE | Freq: Every day | ORAL | 1 refills | Status: DC

## 2016-08-20 NOTE — Progress Notes (Signed)
Pre-visit discussion using our clinic review tool. No additional management support is needed unless otherwise documented below in the visit note.  

## 2016-08-20 NOTE — Progress Notes (Signed)
Patient ID: Alexis Reed, female   DOB: [birth year], 71 y.o.   MRN: [id number]   Subjective:    Patient ID: Alexis Reed, female    DOB: [birth year], 71 y.o.   MRN: [id number]  HPI  Patient here as work in for breast pain.  She reports that she has noticed soreness for the last several weeks.  She first noticed discomfort with carrying objects.  Has been tender to touch.  No nodule.  No trauma or rash.  Persistent soreness left breast.  No pain with deep breathing.  No nipple discharge.     Past Medical History:  Diagnosis Date  . Arthritis   . Benign breast lumps    multiple lumps biopsy and remove x's 5 last being 1979 by Dr. Sharlet Salina  . Fibroids    s/p hysterectomy  . Hypercholesterolemia   . Osteoporosis   . Personal history of tobacco use, presenting hazards to health 08/07/2015   Past Surgical History:  Procedure Laterality Date  . ABDOMINAL HYSTERECTOMY  1997   with bilateral oophorectomy  . BREAST BIOPSY Right 1975   multiple biopsies done  . BREAST BIOPSY Left    multiple done  . BREAST BIOPSY Right 2016   stereo  . BREAST SURGERY Left    lump removed. Unsure of date  . BREAST SURGERY Right    lump removed. Unsure of date  . COLONOSCOPY  2010   Dr. Vira Agar  . TONSILLECTOMY     Family History  Problem Relation Age of Onset  . Colon cancer Father   . Stroke Father   . Heart disease Father     myocardial infarction  . Breast cancer Neg Hx    Social History   Social History  . Marital status: Married    Spouse name: N/A  . Number of children: 2  . Years of education: N/A   Social History Main Topics  . Smoking status: Former Smoker    Packs/day: 1.00    Years: 30.00    Types: Cigarettes    Quit date: 04/26/2004  . Smokeless tobacco: Never Used  . Alcohol use 0.0 oz/week     Comment: occassional  . Drug use: No  . Sexual activity: Not Asked   Other Topics Concern  . None   Social History Narrative   She is married, has two children. Works  at [employer]    Outpatient Encounter Prescriptions as of 08/20/2016  Medication Sig  . B Complex Vitamins (VITAMIN B COMPLEX PO) Take 1 tablet by mouth daily.  . Calcium Carbonate-Vitamin D (CALCIUM 600-D) 600-400 MG-UNIT per tablet Take 1 tablet by mouth 2 (two) times daily.  . Cholecalciferol (VITAMIN D) 2000 UNITS tablet Take 2,000 Units by mouth daily.  . Denosumab (PROLIA [place]) Inject into the skin. Injection twice a year  . Ipratropium-Albuterol (COMBIVENT RESPIMAT) 20-100 MCG/ACT AERS respimat Inhale 1 puff into the lungs every 6 (six) hours as needed for wheezing or shortness of breath.  . meloxicam (MOBIC) 7.5 MG tablet Take 1 tablet (7.5 mg total) by mouth daily as needed.  Marland Kitchen omeprazole (PRILOSEC) 20 MG capsule Take 1 capsule (20 mg total) by mouth daily.  . rosuvastatin (CRESTOR) 5 MG tablet Take 1 tablet (5 mg total) by mouth daily.  . [DISCONTINUED] omeprazole (PRILOSEC) 20 MG capsule TAKE 1 CAPSULE BY MOUTH ONCE DAILY  . [DISCONTINUED] rosuvastatin (CRESTOR) 5 MG tablet TAKE 1 TABLET BY MOUTH ONCE DAILY.   No facility-administered encounter  medications on file as of 08/20/2016.     Review of Systems  Constitutional: Negative for appetite change and unexpected weight change.  Respiratory: Negative for chest tightness and shortness of breath.   Cardiovascular: Negative for chest pain.  Gastrointestinal: Negative for diarrhea, nausea and vomiting.  Musculoskeletal:       Pain left breast/left lateral chest.    Skin: Negative for color change and rash.  Psychiatric/Behavioral: Negative for agitation and dysphoric mood.       Objective:    Physical Exam  Constitutional: She appears well-developed and well-nourished. No distress.  HENT:  Nose: Nose normal.  Mouth/Throat: Oropharynx is clear and moist.  Neck: Neck supple.  Cardiovascular: Normal rate and regular rhythm.   Pulmonary/Chest: Breath sounds normal. No respiratory distress. She has no  wheezes.  Breast exam - no nipple discharge or nipple retraction present.  Could not appreciate any distinct nodules or axillary adenopathy to be present.  Increased soreness left breast 2-3 o'clock region.  Some soreness right as well.    Abdominal: Soft. Bowel sounds are normal. There is no tenderness.  Musculoskeletal: She exhibits no edema or tenderness.  Lymphadenopathy:    She has no cervical adenopathy.  Skin: No rash noted. No erythema.  Psychiatric: She has a normal mood and affect. Her behavior is normal.    BP 120/60 (BP Location: Left Arm, Patient Position: Sitting, Cuff Size: Large)   Pulse 61   Temp 98.6 F (37 C) (Oral)   Wt 195 lb 9.6 oz (88.7 kg)   LMP 07/20/1995   SpO2 97%   BMI 34.11 kg/m  Wt Readings from Last 3 Encounters:  08/20/16 195 lb 9.6 oz (88.7 kg)  08/15/16 185 lb (83.9 kg)  01/23/16 187 lb (84.8 kg)     Lab Results  Component Value Date   WBC 5.6 07/28/2016   HGB 13.8 07/28/2016   HCT 41.8 07/28/2016   PLT 283.0 07/28/2016   GLUCOSE 92 07/28/2016   CHOL 185 07/28/2016   TRIG 135.0 07/28/2016   HDL 57.30 07/28/2016   LDLDIRECT 153.0 07/17/2015   LDLCALC 100 (H) 07/28/2016   ALT 14 07/28/2016   AST 13 07/28/2016   NA 141 07/28/2016   K 4.4 07/28/2016   CL 105 07/28/2016   CREATININE 0.58 07/28/2016   BUN 13 07/28/2016   CO2 28 07/28/2016   TSH 1.64 07/28/2016    Ct Chest Lung Cancer Screening Low Dose Wo Contrast  Result Date: 08/15/2016 CLINICAL DATA:  Asymptomatic smoker, quitting 12 years ago. Thirty pack-year history. EXAM: CT CHEST WITHOUT CONTRAST LOW-DOSE FOR LUNG CANCER SCREENING TECHNIQUE: Multidetector CT imaging of the chest was performed following the standard protocol without IV contrast. COMPARISON:  08/13/2015 FINDINGS: Cardiovascular: Aortic atherosclerosis. Tortuous thoracic aorta. Normal heart size with minimal anterior pericardial fluid or thickening, likely physiologic. Lad coronary artery atherosclerosis.  Mediastinum/Nodes: No mediastinal or definite hilar adenopathy, given limitations of unenhanced CT. Lungs/Pleura: Moderate centrilobular emphysema. Biapical pleural-parenchymal scarring. Bilateral pulmonary nodules are similar. There is a left apical nodule which measures volume derived equivalent diameter 3.8 mm, including on image 51/series 3. This is new, and surrounded by other smaller pulmonary nodules primarily superiorly. Upper Abdomen: Normal imaged portions of the liver, spleen, stomach, pancreas, adrenal glands, kidneys. Musculoskeletal: No acute osseous abnormality. IMPRESSION: 1. Lung-RADS Category 2, benign appearance or behavior. Continue annual screening with low-dose chest CT without contrast in 12 months. 2. Centrilobular emphysema with bilateral pulmonary nodules. New left apical clustered nodularity could be post  infectious or inflammatory. Correlate with interval infectious symptoms. 3.  Coronary artery atherosclerosis. Aortic atherosclerosis. Electronically Signed   By: Abigail Miyamoto M.D.   On: 08/15/2016 17:27       Assessment & Plan:   Problem List Items Addressed This Visit    Breast pain, left - Primary    Persistent left breast pain as outlined.  Exam as outlined. Some pain right breast as well.  Schedule for diagnostic mammogram with ultrasound as outlined.  Further w/up pending results.  Avoid increased caffeine intake.        Relevant Orders   MM Digital Diagnostic Bilat   US BREAST LTD UNI LEFT INC AXILLA   US BREAST LTD UNI RIGHT INC AXILLA   Hypercholesterolemia    Low cholesterol diet and exercise.  Follow lipid panel.        Relevant Medications   rosuvastatin (CRESTOR) 5 MG tablet   Other Relevant Orders   Hepatic function panel   Lipid panel   Basic metabolic panel   Tobacco use disorder    Had low dose CT scan as outlined.  Being followed by oncology.            Einar Pheasant, MD

## 2016-08-21 ENCOUNTER — Encounter: Payer: Self-pay | Admitting: *Deleted

## 2016-08-31 ENCOUNTER — Encounter: Payer: Self-pay | Admitting: Internal Medicine

## 2016-08-31 NOTE — Assessment & Plan Note (Addendum)
Persistent left breast pain as outlined.  Exam as outlined. Some pain right breast as well.  Schedule for diagnostic mammogram with ultrasound as outlined.  Further w/up pending results.  Avoid increased caffeine intake.

## 2016-08-31 NOTE — Assessment & Plan Note (Signed)
Low cholesterol diet and exercise.  Follow lipid panel.   

## 2016-08-31 NOTE — Assessment & Plan Note (Signed)
Had low dose CT scan as outlined.  Being followed by oncology.

## 2016-09-09 ENCOUNTER — Other Ambulatory Visit: Payer: Self-pay | Admitting: Internal Medicine

## 2016-09-09 ENCOUNTER — Ambulatory Visit
Admission: RE | Admit: 2016-09-09 | Discharge: 2016-09-09 | Disposition: A | Discharge status: Home / Self Care | Payer: Medicare HMO | Source: Ambulatory Visit | Attending: Internal Medicine | Admitting: Internal Medicine

## 2016-09-09 DIAGNOSIS — N6489 Other specified disorders of breast: Secondary | ICD-10-CM | POA: Diagnosis not present

## 2016-09-09 DIAGNOSIS — N644 Mastodynia: Secondary | ICD-10-CM | POA: Insufficient documentation

## 2016-09-09 DIAGNOSIS — R928 Other abnormal and inconclusive findings on diagnostic imaging of breast: Secondary | ICD-10-CM | POA: Diagnosis not present

## 2016-09-09 IMAGING — MG MM DIGITAL DIAGNOSTIC BILAT W/ TOMO W/ CAD
8 of 13 series · 8 of 29 positions shown · non-contrast
Comparison: Previous exam(s).

CLINICAL DATA: 70-year-old female presenting for evaluation of
bilateral of focal pain in the far lateral breasts with palpation.
This has been present for 1 month.

EXAM:
2D DIGITAL DIAGNOSTIC BILATERAL MAMMOGRAM WITH CAD AND ADJUNCT TOMO
BILATERAL BREAST ULTRASOUND

[L MLO (1 of 2)]
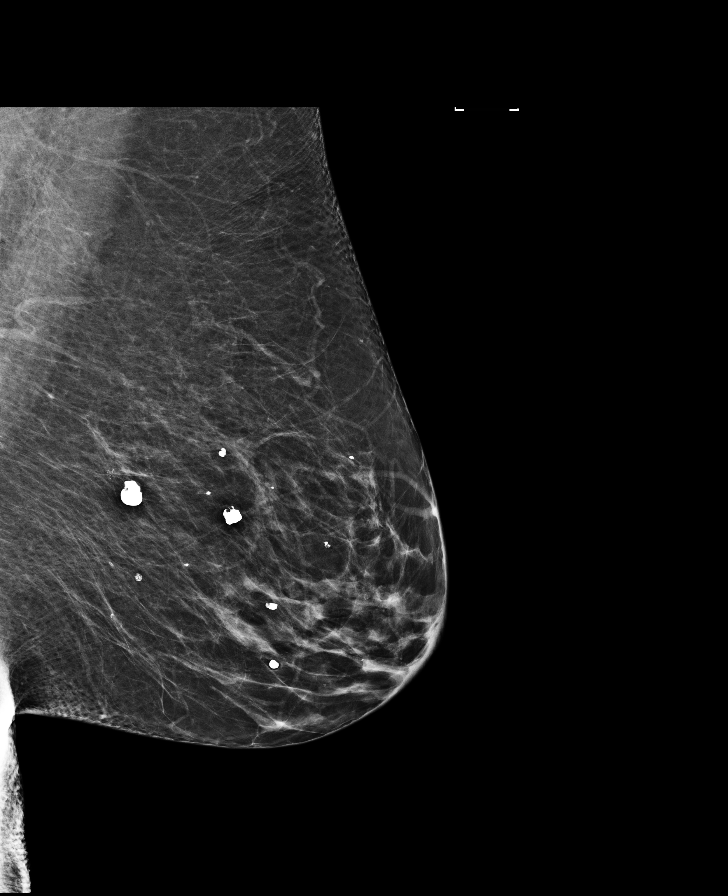

[R CC]
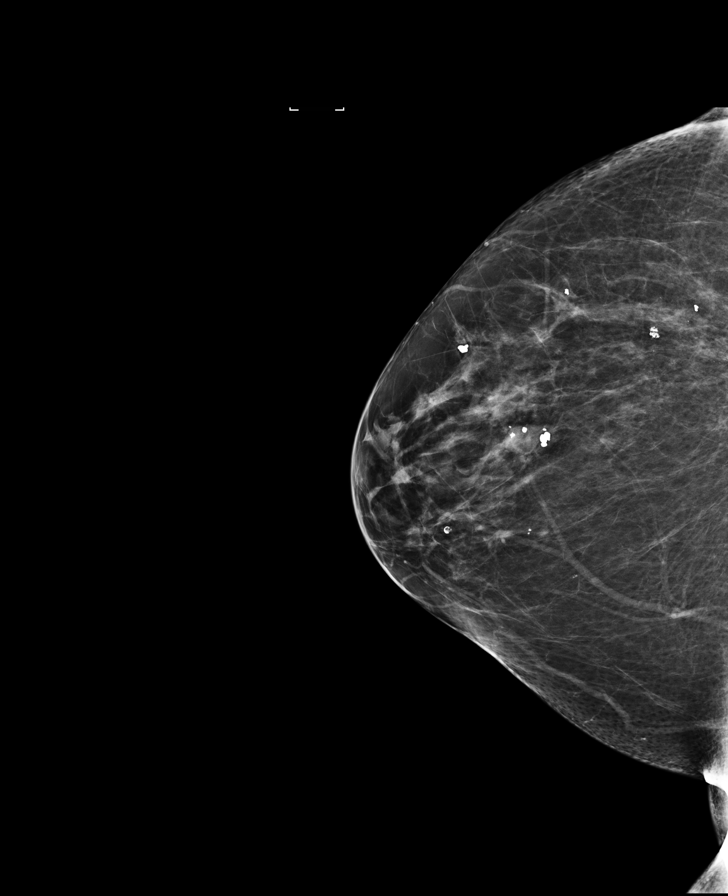

[L CC]
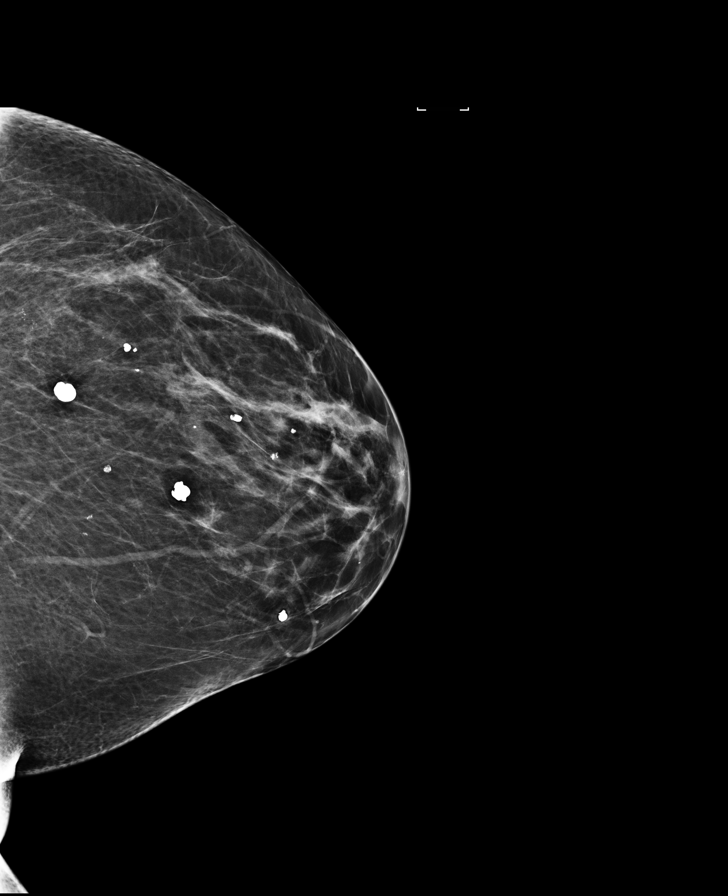

[R MLO synth-2D]
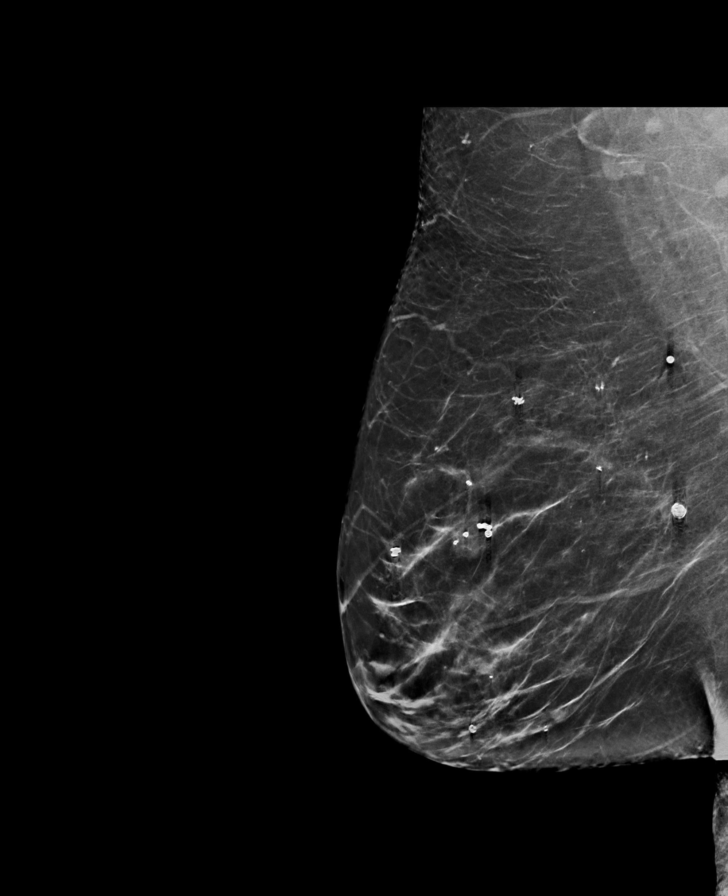

[L MLO synth-2D]
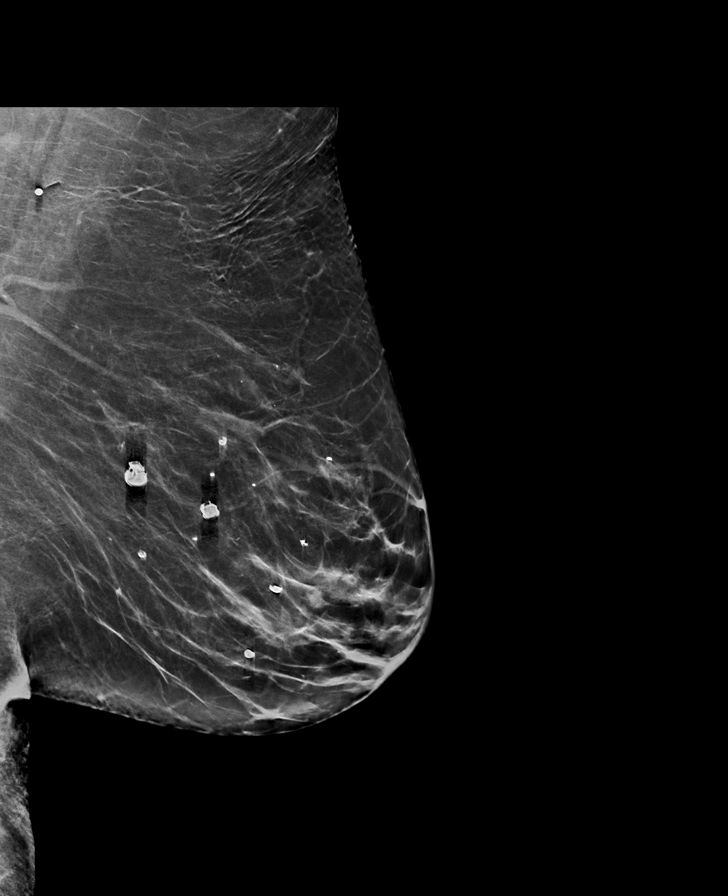

[L CC synth-2D]
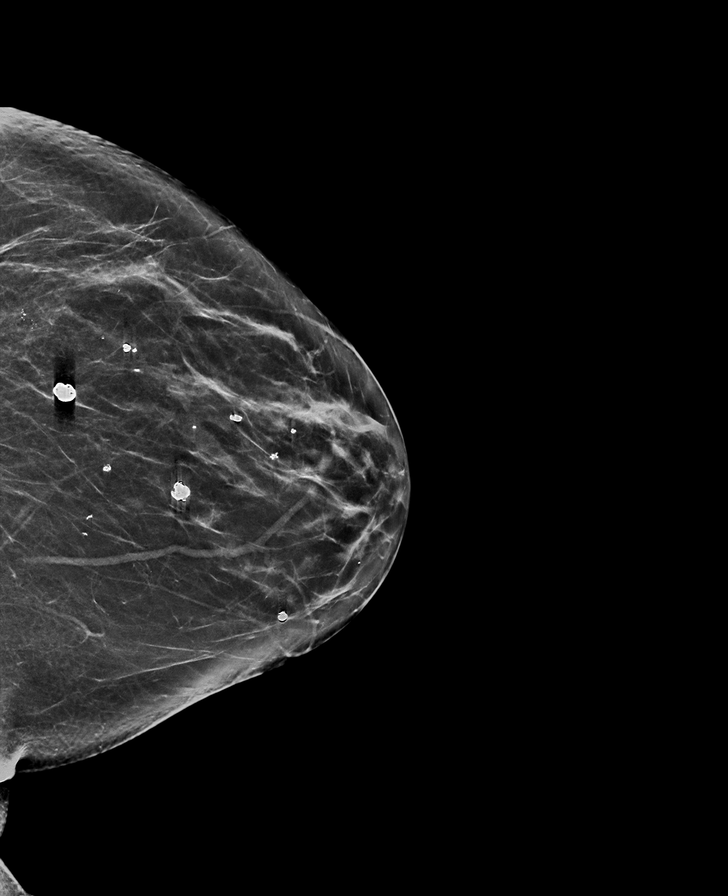

[L MLO (2 of 2)]
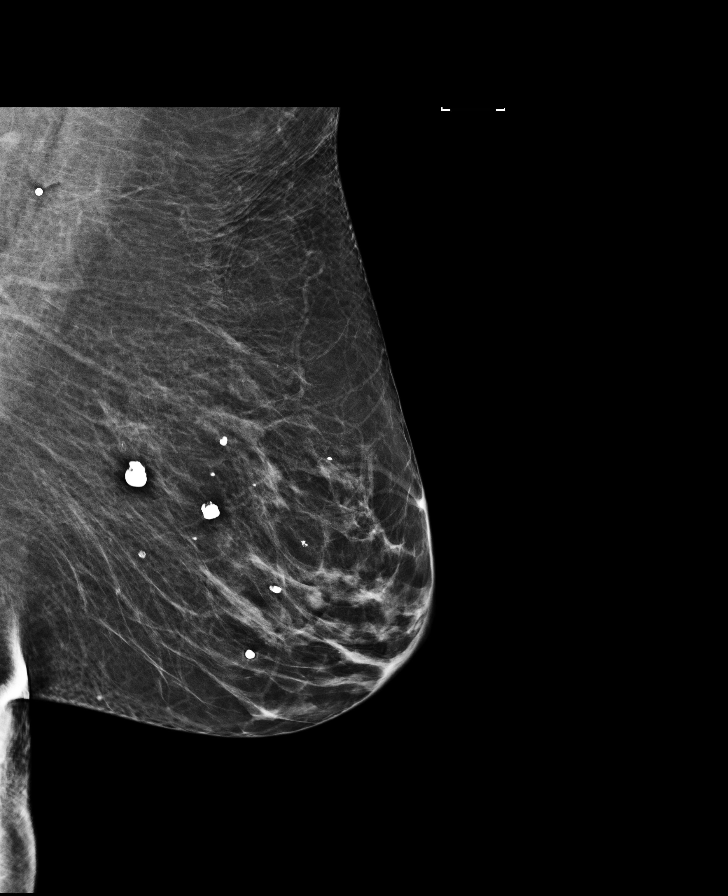

[R MLO]
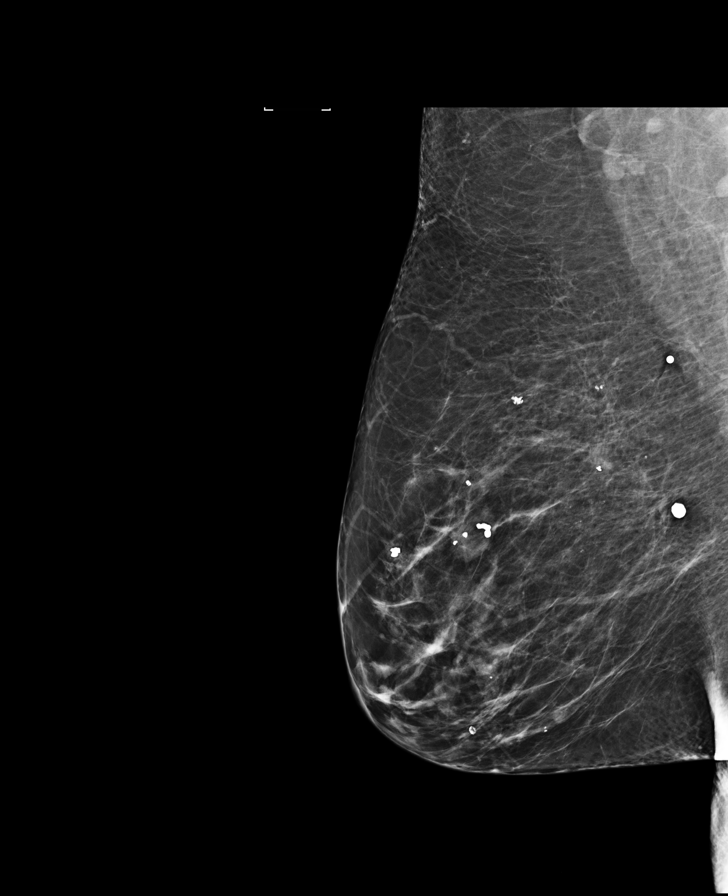

[8 of 29 positions shown; findings below may reference images not displayed]

ACR Breast Density Category b: There are scattered areas of
fibroglandular density.
FINDINGS: No suspicious calcifications, masses or areas of distortion are seen
in the bilateral breasts.

Mammographic images were processed with CAD.

Ultrasound targeted to the lateral aspects of the breasts
bilaterally demonstrate normal fibroglandular tissue. No masses or
suspicious areas of shadowing are identified.
IMPRESSION: 1. There are no mammographic or targeted sonographic abnormalities
at the tender sites in the bilateral lateral breasts.

2.  No mammographic evidence of malignancy in the bilateral breasts.

RECOMMENDATION:
1. Clinical follow-up recommended for the tender area of concern in
the bilateral breasts. Any further workup should be based on
clinical grounds.

2.  Screening mammogram in one year.(Code:[GZ])

I have discussed the findings and recommendations with the patient.
Results were also provided in writing at the conclusion of the
visit. If applicable, a reminder letter will be sent to the patient
regarding the next appointment.

BI-RADS CATEGORY  1: Negative.

## 2016-12-16 DIAGNOSIS — M810 Age-related osteoporosis without current pathological fracture: Secondary | ICD-10-CM | POA: Diagnosis not present

## 2016-12-18 DIAGNOSIS — E559 Vitamin D deficiency, unspecified: Secondary | ICD-10-CM | POA: Diagnosis not present

## 2016-12-18 DIAGNOSIS — M810 Age-related osteoporosis without current pathological fracture: Secondary | ICD-10-CM | POA: Diagnosis not present

## 2016-12-31 DIAGNOSIS — M810 Age-related osteoporosis without current pathological fracture: Secondary | ICD-10-CM | POA: Diagnosis not present

## 2017-01-08 ENCOUNTER — Encounter: Payer: Self-pay | Admitting: Internal Medicine

## 2017-01-08 ENCOUNTER — Other Ambulatory Visit (INDEPENDENT_AMBULATORY_CARE_PROVIDER_SITE_OTHER): Payer: Medicare HMO

## 2017-01-08 DIAGNOSIS — E7800 Pure hypercholesterolemia, unspecified: Secondary | ICD-10-CM

## 2017-01-08 LAB — LIPID PANEL
Cholesterol: 162 mg/dL (ref 0–200)
HDL: 49.7 mg/dL (ref >39.00)
LDL Cholesterol: 87 mg/dL (ref 0–99)
NonHDL: 112.5
Total CHOL/HDL Ratio: 3
Triglycerides: 126 mg/dL (ref 0.0–149.0)
VLDL: 25.2 mg/dL (ref 0.0–40.0)

## 2017-01-08 LAB — HEPATIC FUNCTION PANEL
ALT: 15 U/L (ref 0–35)
AST: 15 U/L (ref 0–37)
Albumin: 4.1 g/dL (ref 3.5–5.2)
Alkaline Phosphatase: 83 U/L (ref 39–117)
Bilirubin, Direct: 0.1 mg/dL (ref 0.0–0.3)
Total Bilirubin: 0.5 mg/dL (ref 0.2–1.2)
Total Protein: 6.7 g/dL (ref 6.0–8.3)

## 2017-01-08 LAB — BASIC METABOLIC PANEL
BUN: 9 mg/dL (ref 6–23)
CO2: 29 mEq/L (ref 19–32)
Calcium: 9.3 mg/dL (ref 8.4–10.5)
Chloride: 106 mEq/L (ref 96–112)
Creatinine, Ser: 0.63 mg/dL (ref 0.40–1.20)
GFR: 99.63 mL/min (ref >60.00)
Glucose, Bld: 98 mg/dL (ref 70–99)
Potassium: 4.5 mEq/L (ref 3.5–5.1)
Sodium: 142 mEq/L (ref 135–145)

## 2017-01-20 ENCOUNTER — Encounter: Payer: Self-pay | Admitting: Internal Medicine

## 2017-01-20 ENCOUNTER — Ambulatory Visit (INDEPENDENT_AMBULATORY_CARE_PROVIDER_SITE_OTHER): Payer: Medicare HMO | Admitting: Internal Medicine

## 2017-01-20 VITALS — BP 120/60 | HR 81 | Temp 98.6°F | Resp 12 | Ht 64.5 in | Wt 194.4 lb

## 2017-01-20 DIAGNOSIS — Z0000 Encounter for general adult medical examination without abnormal findings: Secondary | ICD-10-CM

## 2017-01-20 DIAGNOSIS — Z6832 Body mass index (BMI) 32.0-32.9, adult: Secondary | ICD-10-CM

## 2017-01-20 DIAGNOSIS — F17200 Nicotine dependence, unspecified, uncomplicated: Secondary | ICD-10-CM | POA: Diagnosis not present

## 2017-01-20 DIAGNOSIS — M810 Age-related osteoporosis without current pathological fracture: Secondary | ICD-10-CM

## 2017-01-20 DIAGNOSIS — F439 Reaction to severe stress, unspecified: Secondary | ICD-10-CM | POA: Diagnosis not present

## 2017-01-20 DIAGNOSIS — R69 Illness, unspecified: Secondary | ICD-10-CM | POA: Diagnosis not present

## 2017-01-20 DIAGNOSIS — J438 Other emphysema: Secondary | ICD-10-CM

## 2017-01-20 DIAGNOSIS — K219 Gastro-esophageal reflux disease without esophagitis: Secondary | ICD-10-CM | POA: Diagnosis not present

## 2017-01-20 DIAGNOSIS — L989 Disorder of the skin and subcutaneous tissue, unspecified: Secondary | ICD-10-CM | POA: Diagnosis not present

## 2017-01-20 DIAGNOSIS — E7800 Pure hypercholesterolemia, unspecified: Secondary | ICD-10-CM

## 2017-01-20 MED ORDER — OMEPRAZOLE 20 MG PO CPDR: 20.0000 mg | DELAYED_RELEASE_CAPSULE | Freq: Every day | ORAL | 1 refills | Status: DC

## 2017-01-20 MED ORDER — MUPIROCIN 2 % EX OINT: TOPICAL_OINTMENT | CUTANEOUS | 0 refills | Status: DC

## 2017-01-20 MED ORDER — ROSUVASTATIN CALCIUM 5 MG PO TABS: 5.0000 mg | ORAL_TABLET | Freq: Every day | ORAL | 1 refills | Status: DC

## 2017-01-20 NOTE — Assessment & Plan Note (Addendum)
Physical today 01/20/17.  Mammogram 09/09/16 - Birads I.  Colonoscopy by Dr Tiffany Kocher.  Colonoscopy 11/2014. Recommended f/u in 11/2019. Receiving prolia injections.

## 2017-01-20 NOTE — Progress Notes (Signed)
Patient ID: Alexis Reed, female   DOB: September 06, 1945, 71 y.o.   MRN: [id number]   Subjective:    Patient ID: Alexis Reed, female    DOB: Jun 06, 1946, 71 y.o.   MRN: [id number]  HPI  Patient here for her physical exam.  Increased stress with her mother's health issues.  Overall she feels she is coping relatively well.  Tries to stay active.  No chest pain.  No sob. She did report that she will notice a little wheeze with increased exertion.  Desires no further w/up at this time.  No actual sob.   No acid reflux.  No abdominal pain.  Bowels moving.  Received prolia 12/31/16.  Does have lesion - right lower leg.     Past Medical History:  Diagnosis Date  . Arthritis   . Benign breast lumps    multiple lumps biopsy and remove x's 5 last being 1979 by Dr. Sharlet Salina  . Fibroids    s/p hysterectomy  . Hypercholesterolemia   . Osteoporosis   . Personal history of tobacco use, presenting hazards to health 08/07/2015   Past Surgical History:  Procedure Laterality Date  . ABDOMINAL HYSTERECTOMY  1997   with bilateral oophorectomy  . BREAST BIOPSY Right 1975   multiple biopsies done  . BREAST BIOPSY Left    multiple done  . BREAST BIOPSY Right 2016   stereo  . BREAST SURGERY Left    lump removed. Unsure of date  . BREAST SURGERY Right    lump removed. Unsure of date  . COLONOSCOPY  2010   Dr. Vira Agar  . TONSILLECTOMY     Family History  Problem Relation Age of Onset  . Colon cancer Father   . Stroke Father   . Heart disease Father        myocardial infarction  . Breast cancer Neg Hx    Social History   Social History  . Marital status: Married    Spouse name: N/A  . Number of children: 2  . Years of education: N/A   Social History Main Topics  . Smoking status: Former Smoker    Packs/day: 1.00    Years: 30.00    Types: Cigarettes    Quit date: 04/26/2004  . Smokeless tobacco: Never Used  . Alcohol use 0.0 oz/week     Comment: occassional  . Drug use: No  . Sexual  activity: Not Asked   Other Topics Concern  . None   Social History Narrative   She is married, has two children. Works at [employer]    Outpatient Encounter Prescriptions as of 01/20/2017  Medication Sig  . Calcium Carbonate-Vitamin D (CALCIUM 600-D) 600-400 MG-UNIT per tablet Take 1 tablet by mouth 2 (two) times daily.  . Cholecalciferol (VITAMIN D) 2000 UNITS tablet Take 2,000 Units by mouth daily.  . Denosumab (PROLIA [place]) Inject into the skin. Injection twice a year  . Ipratropium-Albuterol (COMBIVENT RESPIMAT) 20-100 MCG/ACT AERS respimat Inhale 1 puff into the lungs every 6 (six) hours as needed for wheezing or shortness of breath.  Marland Kitchen omeprazole (PRILOSEC) 20 MG capsule Take 1 capsule (20 mg total) by mouth daily.  . rosuvastatin (CRESTOR) 5 MG tablet Take 1 tablet (5 mg total) by mouth daily.  . [DISCONTINUED] B Complex Vitamins (VITAMIN B COMPLEX PO) Take 1 tablet by mouth daily.  . [DISCONTINUED] omeprazole (PRILOSEC) 20 MG capsule Take 1 capsule (20 mg total) by mouth daily.  . [DISCONTINUED] rosuvastatin (CRESTOR) 5  MG tablet Take 1 tablet (5 mg total) by mouth daily.  . meloxicam (MOBIC) 7.5 MG tablet Take 1 tablet (7.5 mg total) by mouth daily as needed. (Patient not taking: Reported on 01/20/2017)  . mupirocin ointment (BACTROBAN) 2 % Apply to affected area bid   No facility-administered encounter medications on file as of 01/20/2017.     Review of Systems  Constitutional: Negative for appetite change and unexpected weight change.  HENT: Negative for congestion and sinus pressure.   Eyes: Negative for pain and visual disturbance.  Respiratory: Negative for cough, chest tightness and shortness of breath.   Cardiovascular: Negative for chest pain, palpitations and leg swelling.  Gastrointestinal: Negative for abdominal pain, diarrhea, nausea and vomiting.  Genitourinary: Negative for difficulty urinating and dysuria.  Musculoskeletal: Negative for  back pain and joint swelling.  Skin: Negative for color change and rash.       Lesion/abrasion - right lower leg.    Neurological: Negative for dizziness, light-headedness and headaches.  Hematological: Negative for adenopathy. Does not bruise/bleed easily.  Psychiatric/Behavioral: Negative for agitation and dysphoric mood.       Objective:     Blood pressure rechecked by me:  120/72  Physical Exam  Constitutional: She is oriented to person, place, and time. She appears well-developed and well-nourished. No distress.  HENT:  Nose: Nose normal.  Mouth/Throat: Oropharynx is clear and moist.  Eyes: Right eye exhibits no discharge. Left eye exhibits no discharge. No scleral icterus.  Neck: Neck supple. No thyromegaly present.  Cardiovascular: Normal rate and regular rhythm.   Pulmonary/Chest: Breath sounds normal. No accessory muscle usage. No tachypnea. No respiratory distress. She has no decreased breath sounds. She has no wheezes. She has no rhonchi. Right breast exhibits no inverted nipple, no mass, no nipple discharge and no tenderness (no axillary adenopathy). Left breast exhibits no inverted nipple, no mass, no nipple discharge and no tenderness (no axilarry adenopathy).  Abdominal: Soft. Bowel sounds are normal. There is no tenderness.  Musculoskeletal: She exhibits no edema or tenderness.  Lymphadenopathy:    She has no cervical adenopathy.  Neurological: She is alert and oriented to person, place, and time.  Skin: Skin is warm. No rash noted. No erythema.  Right lower leg abrasion/lesion.  No surrounding erythema.  No pain.    Psychiatric: She has a normal mood and affect. Her behavior is normal.    BP 120/60 (BP Location: Left Arm, Patient Position: Sitting, Cuff Size: Normal)   Pulse 81   Temp 98.6 F (37 C) (Oral)   Resp 12   Ht 5' 4.5" (1.638 m)   Wt 194 lb 6.4 oz (88.2 kg)   LMP 07/20/1995   SpO2 97%   BMI 32.85 kg/m  Wt Readings from Last 3 Encounters:    01/20/17 194 lb 6.4 oz (88.2 kg)  08/20/16 195 lb 9.6 oz (88.7 kg)  08/15/16 185 lb (83.9 kg)     Lab Results  Component Value Date   WBC 5.6 07/28/2016   HGB 13.8 07/28/2016   HCT 41.8 07/28/2016   PLT 283.0 07/28/2016   GLUCOSE 98 01/08/2017   CHOL 162 01/08/2017   TRIG 126.0 01/08/2017   HDL 49.70 01/08/2017   LDLDIRECT 153.0 07/17/2015   LDLCALC 87 01/08/2017   ALT 15 01/08/2017   AST 15 01/08/2017   NA 142 01/08/2017   K 4.5 01/08/2017   CL 106 01/08/2017   CREATININE 0.63 01/08/2017   BUN 9 01/08/2017   CO2 29 01/08/2017  TSH 1.64 07/28/2016    US Breast Ltd Uni Left Inc Axilla  Result Date: 09/09/2016 CLINICAL DATA:  71 year old female presenting for evaluation of bilateral of focal pain in the far lateral breasts with palpation. This has been present for 1 month. EXAM: 2D DIGITAL DIAGNOSTIC BILATERAL MAMMOGRAM WITH CAD AND ADJUNCT TOMO BILATERAL BREAST ULTRASOUND COMPARISON:  Previous exam(s). ACR Breast Density Category b: There are scattered areas of fibroglandular density. FINDINGS: No suspicious calcifications, masses or areas of distortion are seen in the bilateral breasts. Mammographic images were processed with CAD. Ultrasound targeted to the lateral aspects of the breasts bilaterally demonstrate normal fibroglandular tissue. No masses or suspicious areas of shadowing are identified. IMPRESSION: 1. There are no mammographic or targeted sonographic abnormalities at the tender sites in the bilateral lateral breasts. 2.  No mammographic evidence of malignancy in the bilateral breasts. RECOMMENDATION: 1. Clinical follow-up recommended for the tender area of concern in the bilateral breasts. Any further workup should be based on clinical grounds. 2.  Screening mammogram in one year.(Code:SM-B-01Y) I have discussed the findings and recommendations with the patient. Results were also provided in writing at the conclusion of the visit. If applicable, a reminder letter will be  sent to the patient regarding the next appointment. BI-RADS CATEGORY  1: Negative. Electronically Signed   By: Ammie Ferrier M.D.   On: 09/09/2016 12:53   US Breast Ltd Uni Right Inc Axilla  Result Date: 09/09/2016 CLINICAL DATA:  71 year old female presenting for evaluation of bilateral of focal pain in the far lateral breasts with palpation. This has been present for 1 month. EXAM: 2D DIGITAL DIAGNOSTIC BILATERAL MAMMOGRAM WITH CAD AND ADJUNCT TOMO BILATERAL BREAST ULTRASOUND COMPARISON:  Previous exam(s). ACR Breast Density Category b: There are scattered areas of fibroglandular density. FINDINGS: No suspicious calcifications, masses or areas of distortion are seen in the bilateral breasts. Mammographic images were processed with CAD. Ultrasound targeted to the lateral aspects of the breasts bilaterally demonstrate normal fibroglandular tissue. No masses or suspicious areas of shadowing are identified. IMPRESSION: 1. There are no mammographic or targeted sonographic abnormalities at the tender sites in the bilateral lateral breasts. 2.  No mammographic evidence of malignancy in the bilateral breasts. RECOMMENDATION: 1. Clinical follow-up recommended for the tender area of concern in the bilateral breasts. Any further workup should be based on clinical grounds. 2.  Screening mammogram in one year.(Code:SM-B-01Y) I have discussed the findings and recommendations with the patient. Results were also provided in writing at the conclusion of the visit. If applicable, a reminder letter will be sent to the patient regarding the next appointment. BI-RADS CATEGORY  1: Negative. Electronically Signed   By: Ammie Ferrier M.D.   On: 09/09/2016 12:53   Mm Diag Breast Tomo Bilateral  Result Date: 09/09/2016 CLINICAL DATA:  71 year old female presenting for evaluation of bilateral of focal pain in the far lateral breasts with palpation. This has been present for 1 month. EXAM: 2D DIGITAL DIAGNOSTIC BILATERAL  MAMMOGRAM WITH CAD AND ADJUNCT TOMO BILATERAL BREAST ULTRASOUND COMPARISON:  Previous exam(s). ACR Breast Density Category b: There are scattered areas of fibroglandular density. FINDINGS: No suspicious calcifications, masses or areas of distortion are seen in the bilateral breasts. Mammographic images were processed with CAD. Ultrasound targeted to the lateral aspects of the breasts bilaterally demonstrate normal fibroglandular tissue. No masses or suspicious areas of shadowing are identified. IMPRESSION: 1. There are no mammographic or targeted sonographic abnormalities at the tender sites in the bilateral lateral breasts. 2.  No mammographic evidence of malignancy in the bilateral breasts. RECOMMENDATION: 1. Clinical follow-up recommended for the tender area of concern in the bilateral breasts. Any further workup should be based on clinical grounds. 2.  Screening mammogram in one year.(Code:SM-B-01Y) I have discussed the findings and recommendations with the patient. Results were also provided in writing at the conclusion of the visit. If applicable, a reminder letter will be sent to the patient regarding the next appointment. BI-RADS CATEGORY  1: Negative. Electronically Signed   By: Ammie Ferrier M.D.   On: 09/09/2016 12:53       Assessment & Plan:   Problem List Items Addressed This Visit    Body mass index (bmi) 32.0-32.9, adult    Discussed diet and exercise.  Follow.        GERD (gastroesophageal reflux disease)    Controlled on omeprazole.        Relevant Medications   omeprazole (PRILOSEC) 20 MG capsule   GOLD GRADE A COPD with Centrilobular Emphysema    Breathing overall stable.  Some occasional wheezing with increased exertion.  Desires no further testing or evaluation at this time.  Follow.        Health care maintenance    Physical today 01/20/17.  Mammogram 09/09/16 - Birads I.  Colonoscopy by Dr Tiffany Kocher.  Colonoscopy 11/2014. Recommended f/u in 11/2019. Receiving prolia  injections.       Hypercholesterolemia    Low cholesterol diet and exercise.  Follow lipid panel.        Relevant Medications   rosuvastatin (CRESTOR) 5 MG tablet   Other Relevant Orders   TSH   Lipid panel   Hepatic function panel   CBC with Differential/Platelet   Basic metabolic panel   Osteoporosis    Receiving prolia - last 12/31/16.       Relevant Orders   VITAMIN D 25 Hydroxy (Vit-D Deficiency, Fractures)   Stress    Discussed with her today.  She does not feel needs anything more at this time.  Follow.        Tobacco use disorder    S/p low dose screening CT 08/2016.  Continue f/u with oncology.         Other Visit Diagnoses    Routine general medical examination at a health care facility    -  Primary   Skin lesion of right leg       Bactroban as directed.  notify me if does not resolve.         Einar Pheasant, MD

## 2017-01-20 NOTE — Progress Notes (Signed)
Pre-visit discussion using our clinic review tool. No additional management support is needed unless otherwise documented below in the visit note.  

## 2017-01-21 ENCOUNTER — Encounter: Payer: Self-pay | Admitting: Internal Medicine

## 2017-01-21 NOTE — Assessment & Plan Note (Signed)
S/p low dose screening CT 08/2016.  Continue f/u with oncology.

## 2017-01-21 NOTE — Assessment & Plan Note (Signed)
Breathing overall stable.  Some occasional wheezing with increased exertion.  Desires no further testing or evaluation at this time.  Follow.

## 2017-01-21 NOTE — Assessment & Plan Note (Signed)
Receiving prolia - last 12/31/16.

## 2017-01-21 NOTE — Assessment & Plan Note (Signed)
Discussed with her today.  She does not feel needs anything more at this time.  Follow.   

## 2017-01-21 NOTE — Assessment & Plan Note (Signed)
Controlled on omeprazole.   

## 2017-01-21 NOTE — Assessment & Plan Note (Signed)
Discussed diet and exercise.  Follow.  

## 2017-01-21 NOTE — Assessment & Plan Note (Signed)
Low cholesterol diet and exercise.  Follow lipid panel.   

## 2017-03-25 DIAGNOSIS — L821 Other seborrheic keratosis: Secondary | ICD-10-CM | POA: Diagnosis not present

## 2017-03-25 DIAGNOSIS — Z872 Personal history of diseases of the skin and subcutaneous tissue: Secondary | ICD-10-CM | POA: Diagnosis not present

## 2017-03-25 DIAGNOSIS — L578 Other skin changes due to chronic exposure to nonionizing radiation: Secondary | ICD-10-CM | POA: Diagnosis not present

## 2017-04-28 ENCOUNTER — Ambulatory Visit (INDEPENDENT_AMBULATORY_CARE_PROVIDER_SITE_OTHER): Payer: Medicare HMO

## 2017-04-28 DIAGNOSIS — Z23 Encounter for immunization: Secondary | ICD-10-CM

## 2017-06-19 DIAGNOSIS — M810 Age-related osteoporosis without current pathological fracture: Secondary | ICD-10-CM | POA: Diagnosis not present

## 2017-07-03 ENCOUNTER — Other Ambulatory Visit: Payer: Self-pay | Admitting: Internal Medicine

## 2017-07-03 DIAGNOSIS — M810 Age-related osteoporosis without current pathological fracture: Secondary | ICD-10-CM

## 2017-07-23 ENCOUNTER — Encounter: Payer: Self-pay | Admitting: Internal Medicine

## 2017-07-23 ENCOUNTER — Other Ambulatory Visit (INDEPENDENT_AMBULATORY_CARE_PROVIDER_SITE_OTHER): Payer: Medicare HMO

## 2017-07-23 DIAGNOSIS — M810 Age-related osteoporosis without current pathological fracture: Secondary | ICD-10-CM | POA: Diagnosis not present

## 2017-07-23 DIAGNOSIS — E7800 Pure hypercholesterolemia, unspecified: Secondary | ICD-10-CM

## 2017-07-23 LAB — CBC WITH DIFFERENTIAL/PLATELET
Basophils Absolute: 0.1 10*3/uL (ref 0.0–0.1)
Basophils Relative: 1.3 % (ref 0.0–3.0)
Eosinophils Absolute: 0.2 10*3/uL (ref 0.0–0.7)
Eosinophils Relative: 3.2 % (ref 0.0–5.0)
HCT: 43.9 % (ref 36.0–46.0)
Hemoglobin: 14.4 g/dL (ref 12.0–15.0)
Lymphocytes Relative: 27.8 % (ref 12.0–46.0)
Lymphs Abs: 1.6 10*3/uL (ref 0.7–4.0)
MCHC: 32.7 g/dL (ref 30.0–36.0)
MCV: 93 fl (ref 78.0–100.0)
Monocytes Absolute: 0.4 10*3/uL (ref 0.1–1.0)
Monocytes Relative: 7.8 % (ref 3.0–12.0)
Neutro Abs: 3.4 10*3/uL (ref 1.4–7.7)
Neutrophils Relative %: 59.9 % (ref 43.0–77.0)
Platelets: 292 10*3/uL (ref 150.0–400.0)
RBC: 4.72 Mil/uL (ref 3.87–5.11)
RDW: 13.9 % (ref 11.5–15.5)
WBC: 5.7 10*3/uL (ref 4.0–10.5)

## 2017-07-23 LAB — BASIC METABOLIC PANEL
BUN: 13 mg/dL (ref 6–23)
CO2: 30 mEq/L (ref 19–32)
Calcium: 9.3 mg/dL (ref 8.4–10.5)
Chloride: 104 mEq/L (ref 96–112)
Creatinine, Ser: 0.62 mg/dL (ref 0.40–1.20)
GFR: 101.33 mL/min (ref >60.00)
Glucose, Bld: 96 mg/dL (ref 70–99)
Potassium: 4.6 mEq/L (ref 3.5–5.1)
Sodium: 142 mEq/L (ref 135–145)

## 2017-07-23 LAB — LIPID PANEL
Cholesterol: 174 mg/dL (ref 0–200)
HDL: 52.9 mg/dL (ref >39.00)
LDL Cholesterol: 94 mg/dL (ref 0–99)
NonHDL: 120.6
Total CHOL/HDL Ratio: 3
Triglycerides: 132 mg/dL (ref 0.0–149.0)
VLDL: 26.4 mg/dL (ref 0.0–40.0)

## 2017-07-23 LAB — VITAMIN D 25 HYDROXY (VIT D DEFICIENCY, FRACTURES): VITD: 31.4 ng/mL (ref 30.00–100.00)

## 2017-07-23 LAB — HEPATIC FUNCTION PANEL
ALT: 12 U/L (ref 0–35)
AST: 12 U/L (ref 0–37)
Albumin: 4.3 g/dL (ref 3.5–5.2)
Alkaline Phosphatase: 71 U/L (ref 39–117)
Bilirubin, Direct: 0.1 mg/dL (ref 0.0–0.3)
Total Bilirubin: 0.5 mg/dL (ref 0.2–1.2)
Total Protein: 6.9 g/dL (ref 6.0–8.3)

## 2017-07-23 LAB — TSH: TSH: 2.59 u[IU]/mL (ref 0.35–4.50)

## 2017-07-27 ENCOUNTER — Other Ambulatory Visit: Payer: Medicare HMO

## 2017-07-29 ENCOUNTER — Telehealth: Payer: Self-pay

## 2017-07-29 ENCOUNTER — Encounter: Payer: Self-pay | Admitting: Internal Medicine

## 2017-07-29 ENCOUNTER — Ambulatory Visit: Payer: Medicare HMO | Admitting: Internal Medicine

## 2017-07-29 VITALS — BP 128/70 | HR 74 | Temp 98.0°F | Resp 15 | Wt 193.8 lb

## 2017-07-29 DIAGNOSIS — F439 Reaction to severe stress, unspecified: Secondary | ICD-10-CM

## 2017-07-29 DIAGNOSIS — K219 Gastro-esophageal reflux disease without esophagitis: Secondary | ICD-10-CM

## 2017-07-29 DIAGNOSIS — Z6832 Body mass index (BMI) 32.0-32.9, adult: Secondary | ICD-10-CM

## 2017-07-29 DIAGNOSIS — E7800 Pure hypercholesterolemia, unspecified: Secondary | ICD-10-CM | POA: Diagnosis not present

## 2017-07-29 DIAGNOSIS — M810 Age-related osteoporosis without current pathological fracture: Secondary | ICD-10-CM | POA: Diagnosis not present

## 2017-07-29 DIAGNOSIS — M25552 Pain in left hip: Secondary | ICD-10-CM

## 2017-07-29 DIAGNOSIS — M25551 Pain in right hip: Secondary | ICD-10-CM | POA: Diagnosis not present

## 2017-07-29 DIAGNOSIS — R69 Illness, unspecified: Secondary | ICD-10-CM | POA: Diagnosis not present

## 2017-07-29 DIAGNOSIS — J438 Other emphysema: Secondary | ICD-10-CM

## 2017-07-29 DIAGNOSIS — Z23 Encounter for immunization: Secondary | ICD-10-CM | POA: Diagnosis not present

## 2017-07-29 MED ORDER — ROSUVASTATIN CALCIUM 5 MG PO TABS: 5.0000 mg | ORAL_TABLET | Freq: Every day | ORAL | 1 refills | Status: DC

## 2017-07-29 NOTE — Progress Notes (Signed)
Patient ID: Alexis Reed, female   DOB: Feb 23, 1946, 73 y.o.   MRN: [id number]   Subjective:    Patient ID: Alexis Reed, female    DOB: [birth year], 72 y.o.   MRN: [id number]  HPI  Patient here for a scheduled follow up.  She reports she is doing well.  Feels good. Handling stress.  Mother is doing well.  Some occasional aching in her hips.  States she notices if she has been sitting for while and then goes to get up/stand.  Will be stiff and ache until she gets moving.  Stays active.  No chest pain.  No sob.  No acid reflux.  No abdominal pain.  Bowels moving.  No urine change.  Getting prolia.  Due f/u with Alexis Reed 08/2017.  Discussed labs. Discussed increasing crestor dose.     Past Medical History:  Diagnosis Date  . Arthritis   . Benign breast lumps    multiple lumps biopsy and remove x's 5 last being 1979 by Alexis. Sharlet Reed  . Fibroids    s/p hysterectomy  . Hypercholesterolemia   . Osteoporosis   . Personal history of tobacco use, presenting hazards to health 08/07/2015   Past Surgical History:  Procedure Laterality Date  . ABDOMINAL HYSTERECTOMY  1997   with bilateral oophorectomy  . BREAST BIOPSY Right 1975   multiple biopsies done  . BREAST BIOPSY Left    multiple done  . BREAST BIOPSY Right 2016   stereo  . BREAST SURGERY Left    lump removed. Unsure of date  . BREAST SURGERY Right    lump removed. Unsure of date  . COLONOSCOPY  2010   Alexis. Vira Reed  . TONSILLECTOMY     Family History  Problem Relation Age of Onset  . Colon cancer Father   . Stroke Father   . Heart disease Father        myocardial infarction  . Breast cancer Neg Hx    Social History   Socioeconomic History  . Marital status: Married    Spouse name: None  . Number of children: 2  . Years of education: None  . Highest education level: None  Social Needs  . Financial resource strain: None  . Food insecurity - worry: None  . Food insecurity - inability: None  . Transportation needs -  medical: None  . Transportation needs - non-medical: None  Occupational History  . None  Tobacco Use  . Smoking status: Former Smoker    Packs/day: 1.00    Years: 30.00    Pack years: 30.00    Types: Cigarettes    Last attempt to quit: 04/26/2004    Years since quitting: 13.2  . Smokeless tobacco: Never Used  Substance and Sexual Activity  . Alcohol use: Yes    Alcohol/week: 0.0 oz    Comment: occassional  . Drug use: No  . Sexual activity: None  Other Topics Concern  . None  Social History Narrative   She is married, has two children. Works at [employer]    Outpatient Encounter Medications as of 07/29/2017  Medication Sig  . Calcium Carbonate-Vitamin D (CALCIUM 600-D) 600-400 MG-UNIT per tablet Take 1 tablet by mouth 2 (two) times daily.  . Cholecalciferol (VITAMIN D) 2000 UNITS tablet Take 2,000 Units by mouth daily.  . Denosumab (PROLIA [place]) Inject into the skin. Injection twice a year  . Ipratropium-Albuterol (COMBIVENT RESPIMAT) 20-100 MCG/ACT AERS respimat Inhale 1 puff into the lungs  every 6 (six) hours as needed for wheezing or shortness of breath.  . meloxicam (MOBIC) 7.5 MG tablet Take 1 tablet (7.5 mg total) by mouth daily as needed. (Patient not taking: Reported on 01/20/2017)  . mupirocin ointment (BACTROBAN) 2 % Apply to affected area bid  . omeprazole (PRILOSEC) 20 MG capsule Take 1 capsule (20 mg total) by mouth daily.  . rosuvastatin (CRESTOR) 5 MG tablet Take 1 tablet (5 mg total) by mouth daily.  . [DISCONTINUED] rosuvastatin (CRESTOR) 5 MG tablet Take 1 tablet (5 mg total) by mouth daily.   No facility-administered encounter medications on file as of 07/29/2017.     Review of Systems  Constitutional: Negative for appetite change and unexpected weight change.  HENT: Negative for congestion and sinus pressure.   Respiratory: Negative for cough, chest tightness and shortness of breath.   Cardiovascular: Negative for chest pain,  palpitations and leg swelling.  Gastrointestinal: Negative for abdominal pain, diarrhea, nausea and vomiting.  Genitourinary: Negative for difficulty urinating and dysuria.  Musculoskeletal: Negative for joint swelling and myalgias.       Bilateral hip pain as outlined.    Skin: Negative for color change and rash.  Neurological: Negative for dizziness, light-headedness and headaches.  Psychiatric/Behavioral: Negative for agitation and dysphoric mood.       Objective:     Blood pressure rechecked by me:  122/78  Physical Exam  Constitutional: She appears well-developed and well-nourished. No distress.  HENT:  Nose: Nose normal.  Mouth/Throat: Oropharynx is clear and moist.  Neck: Neck supple. No thyromegaly present.  Cardiovascular: Normal rate and regular rhythm.  Pulmonary/Chest: Breath sounds normal. No respiratory distress. She has no wheezes.  Abdominal: Soft. Bowel sounds are normal. There is no tenderness.  Musculoskeletal: She exhibits no edema or tenderness.  Good rom - hips.  No pain with walking.   Lymphadenopathy:    She has no cervical adenopathy.  Skin: No rash noted. No erythema.  Psychiatric: She has a normal mood and affect. Her behavior is normal.    BP 128/70 (BP Location: Left Arm, Patient Position: Sitting, Cuff Size: Normal)   Pulse 74   Temp 98 F (36.7 C) (Oral)   Resp 15   Wt 193 lb 12.8 oz (87.9 kg)   LMP 07/20/1995   SpO2 97%   BMI 32.75 kg/m  Wt Readings from Last 3 Encounters:  07/29/17 193 lb 12.8 oz (87.9 kg)  01/20/17 194 lb 6.4 oz (88.2 kg)  08/20/16 195 lb 9.6 oz (88.7 kg)     Lab Results  Component Value Date   WBC 5.7 07/23/2017   HGB 14.4 07/23/2017   HCT 43.9 07/23/2017   PLT 292.0 07/23/2017   GLUCOSE 96 07/23/2017   CHOL 174 07/23/2017   TRIG 132.0 07/23/2017   HDL 52.90 07/23/2017   LDLDIRECT 153.0 07/17/2015   LDLCALC 94 07/23/2017   ALT 12 07/23/2017   AST 12 07/23/2017   NA 142 07/23/2017   K 4.6 07/23/2017    CL 104 07/23/2017   CREATININE 0.62 07/23/2017   BUN 13 07/23/2017   CO2 30 07/23/2017   TSH 2.59 07/23/2017    US Breast Ltd Uni Left Inc Axilla  Result Date: 09/09/2016 CLINICAL DATA:  72 year old female presenting for evaluation of bilateral of focal pain in the far lateral breasts with palpation. This has been present for 1 month. EXAM: 2D DIGITAL DIAGNOSTIC BILATERAL MAMMOGRAM WITH CAD AND ADJUNCT TOMO BILATERAL BREAST ULTRASOUND COMPARISON:  Previous exam(s). ACR Breast  Density Category b: There are scattered areas of fibroglandular density. FINDINGS: No suspicious calcifications, masses or areas of distortion are seen in the bilateral breasts. Mammographic images were processed with CAD. Ultrasound targeted to the lateral aspects of the breasts bilaterally demonstrate normal fibroglandular tissue. No masses or suspicious areas of shadowing are identified. IMPRESSION: 1. There are no mammographic or targeted sonographic abnormalities at the tender sites in the bilateral lateral breasts. 2.  No mammographic evidence of malignancy in the bilateral breasts. RECOMMENDATION: 1. Clinical follow-up recommended for the tender area of concern in the bilateral breasts. Any further workup should be based on clinical grounds. 2.  Screening mammogram in one year.(Code:SM-B-01Y) I have discussed the findings and recommendations with the patient. Results were also provided in writing at the conclusion of the visit. If applicable, a reminder letter will be sent to the patient regarding the next appointment. BI-RADS CATEGORY  1: Negative. Electronically Signed   By: Ammie Ferrier M.D.   On: 09/09/2016 12:53   US Breast Ltd Uni Right Inc Axilla  Result Date: 09/09/2016 CLINICAL DATA:  72 year old female presenting for evaluation of bilateral of focal pain in the far lateral breasts with palpation. This has been present for 1 month. EXAM: 2D DIGITAL DIAGNOSTIC BILATERAL MAMMOGRAM WITH CAD AND ADJUNCT TOMO  BILATERAL BREAST ULTRASOUND COMPARISON:  Previous exam(s). ACR Breast Density Category b: There are scattered areas of fibroglandular density. FINDINGS: No suspicious calcifications, masses or areas of distortion are seen in the bilateral breasts. Mammographic images were processed with CAD. Ultrasound targeted to the lateral aspects of the breasts bilaterally demonstrate normal fibroglandular tissue. No masses or suspicious areas of shadowing are identified. IMPRESSION: 1. There are no mammographic or targeted sonographic abnormalities at the tender sites in the bilateral lateral breasts. 2.  No mammographic evidence of malignancy in the bilateral breasts. RECOMMENDATION: 1. Clinical follow-up recommended for the tender area of concern in the bilateral breasts. Any further workup should be based on clinical grounds. 2.  Screening mammogram in one year.(Code:SM-B-01Y) I have discussed the findings and recommendations with the patient. Results were also provided in writing at the conclusion of the visit. If applicable, a reminder letter will be sent to the patient regarding the next appointment. BI-RADS CATEGORY  1: Negative. Electronically Signed   By: Ammie Ferrier M.D.   On: 09/09/2016 12:53   Mm Diag Breast Tomo Bilateral  Result Date: 09/09/2016 CLINICAL DATA:  72 year old female presenting for evaluation of bilateral of focal pain in the far lateral breasts with palpation. This has been present for 1 month. EXAM: 2D DIGITAL DIAGNOSTIC BILATERAL MAMMOGRAM WITH CAD AND ADJUNCT TOMO BILATERAL BREAST ULTRASOUND COMPARISON:  Previous exam(s). ACR Breast Density Category b: There are scattered areas of fibroglandular density. FINDINGS: No suspicious calcifications, masses or areas of distortion are seen in the bilateral breasts. Mammographic images were processed with CAD. Ultrasound targeted to the lateral aspects of the breasts bilaterally demonstrate normal fibroglandular tissue. No masses or suspicious  areas of shadowing are identified. IMPRESSION: 1. There are no mammographic or targeted sonographic abnormalities at the tender sites in the bilateral lateral breasts. 2.  No mammographic evidence of malignancy in the bilateral breasts. RECOMMENDATION: 1. Clinical follow-up recommended for the tender area of concern in the bilateral breasts. Any further workup should be based on clinical grounds. 2.  Screening mammogram in one year.(Code:SM-B-01Y) I have discussed the findings and recommendations with the patient. Results were also provided in writing at the conclusion of the visit. If  applicable, a reminder letter will be sent to the patient regarding the next appointment. BI-RADS CATEGORY  1: Negative. Electronically Signed   By: Ammie Ferrier M.D.   On: 09/09/2016 12:53       Assessment & Plan:   Problem List Items Addressed This Visit    Body mass index (bmi) 32.0-32.9, adult    Discussed diet and exercise.  Follow.       GERD (gastroesophageal reflux disease)    Controlled on omeprazole.        GOLD GRADE A COPD with Centrilobular Emphysema    Previously saw pulmonary.  Breathing stable.  No sob.  Follow.       Hypercholesterolemia    Low cholesterol diet and exercise.  Discussed labs.  Tolerating low dose crestor.  Increased to one whole tablet per day.  Follow lipid panel and liver function tests.        Relevant Medications   rosuvastatin (CRESTOR) 5 MG tablet   Other Relevant Orders   Hepatic function panel   Lipid panel   Basic metabolic panel   Osteoporosis    Receiving prolia.  Has f/u with Alexis Reed in 08/2017.  Recent vitamin D level wnl.        Stress    Handling stress.  Doing well.  Follow.         Other Visit Diagnoses    Need for vaccination with 13-polyvalent pneumococcal conjugate vaccine    -  Primary   Relevant Orders   Pneumococcal conjugate vaccine 13-valent (Completed)   Hip pain, bilateral       Stiffness when gets up as outlined.  Walking  better.  Discussed exercise.  She does not feel needs anything more.  Follow.         Einar Pheasant, MD

## 2017-07-29 NOTE — Telephone Encounter (Signed)
Mammogram has been scheduled. Patient notified via MyChart as she requested.

## 2017-08-01 ENCOUNTER — Encounter: Payer: Self-pay | Admitting: Internal Medicine

## 2017-08-01 NOTE — Assessment & Plan Note (Addendum)
Receiving prolia.  Has f/u with Dr Gabriel Carina in 08/2017.  Recent vitamin D level wnl.

## 2017-08-01 NOTE — Assessment & Plan Note (Signed)
Previously saw pulmonary.  Breathing stable.  No sob.  Follow.

## 2017-08-01 NOTE — Assessment & Plan Note (Signed)
Low cholesterol diet and exercise.  Discussed labs.  Tolerating low dose crestor.  Increased to one whole tablet per day.  Follow lipid panel and liver function tests.

## 2017-08-01 NOTE — Assessment & Plan Note (Signed)
Discussed diet and exercise.  Follow.  

## 2017-08-01 NOTE — Assessment & Plan Note (Signed)
Handling stress.  Doing well.  Follow.  

## 2017-08-01 NOTE — Assessment & Plan Note (Signed)
Controlled on omeprazole.   

## 2017-08-17 ENCOUNTER — Telehealth: Payer: Self-pay | Admitting: *Deleted

## 2017-08-17 DIAGNOSIS — Z87891 Personal history of nicotine dependence: Secondary | ICD-10-CM

## 2017-08-17 DIAGNOSIS — Z122 Encounter for screening for malignant neoplasm of respiratory organs: Secondary | ICD-10-CM

## 2017-08-17 NOTE — Telephone Encounter (Signed)
Notified patient that annual lung cancer screening low dose CT scan is due currently or will be in near future. Confirmed that patient is within the age range of 55-77, and asymptomatic, (no signs or symptoms of lung cancer). Patient denies illness that would prevent curative treatment for lung cancer if found. Verified smoking history, (former, quit 2006, 30 pack year). The shared decision making visit was done 06/14/14. Patient is agreeable for CT scan being scheduled.

## 2017-08-19 ENCOUNTER — Ambulatory Visit
Admission: RE | Admit: 2017-08-19 | Discharge: 2017-08-19 | Disposition: A | Discharge status: Home / Self Care | Payer: Medicare HMO | Source: Ambulatory Visit | Attending: Internal Medicine | Admitting: Internal Medicine

## 2017-08-19 DIAGNOSIS — Z13820 Encounter for screening for osteoporosis: Secondary | ICD-10-CM | POA: Insufficient documentation

## 2017-08-19 DIAGNOSIS — M810 Age-related osteoporosis without current pathological fracture: Secondary | ICD-10-CM | POA: Diagnosis not present

## 2017-08-19 DIAGNOSIS — Z780 Asymptomatic menopausal state: Secondary | ICD-10-CM | POA: Diagnosis not present

## 2017-09-02 ENCOUNTER — Ambulatory Visit
Admission: RE | Admit: 2017-09-02 | Discharge: 2017-09-02 | Disposition: A | Discharge status: Home / Self Care | Payer: Medicare HMO | Source: Ambulatory Visit | Attending: Nurse Practitioner | Admitting: Nurse Practitioner

## 2017-09-02 DIAGNOSIS — M810 Age-related osteoporosis without current pathological fracture: Secondary | ICD-10-CM | POA: Diagnosis not present

## 2017-09-02 DIAGNOSIS — J432 Centrilobular emphysema: Secondary | ICD-10-CM | POA: Insufficient documentation

## 2017-09-02 DIAGNOSIS — Z87891 Personal history of nicotine dependence: Secondary | ICD-10-CM | POA: Diagnosis not present

## 2017-09-02 DIAGNOSIS — I700 Atherosclerosis of aorta: Secondary | ICD-10-CM | POA: Diagnosis not present

## 2017-09-02 DIAGNOSIS — Z122 Encounter for screening for malignant neoplasm of respiratory organs: Secondary | ICD-10-CM

## 2017-09-02 DIAGNOSIS — I2510 Atherosclerotic heart disease of native coronary artery without angina pectoris: Secondary | ICD-10-CM | POA: Diagnosis not present

## 2017-09-02 DIAGNOSIS — M47814 Spondylosis without myelopathy or radiculopathy, thoracic region: Secondary | ICD-10-CM | POA: Diagnosis not present

## 2017-09-02 IMAGING — CT CT CHEST LUNG CANCER SCREENING LOW DOSE W/O CM
2 of 5 series · 15 of 40 positions shown, 18 images · non-contrast
Comparison: [DATE] screening chest CT.

CLINICAL DATA: 71-year-old asymptomatic female former smoker with
30 pack-year smoking history, quit smoking in [9C].

EXAM:
CT CHEST WITHOUT CONTRAST LOW-DOSE FOR LUNG CANCER SCREENING
TECHNIQUE: Multidetector CT imaging of the chest was performed following the
standard protocol without IV contrast.

[Series 3: lung 1.00 br60 · axial · 0.63mm/px · z∈[-1435,-1134]mm · 12 of 333 slices shown, 15 images]
[im 16/333  mediastinal]
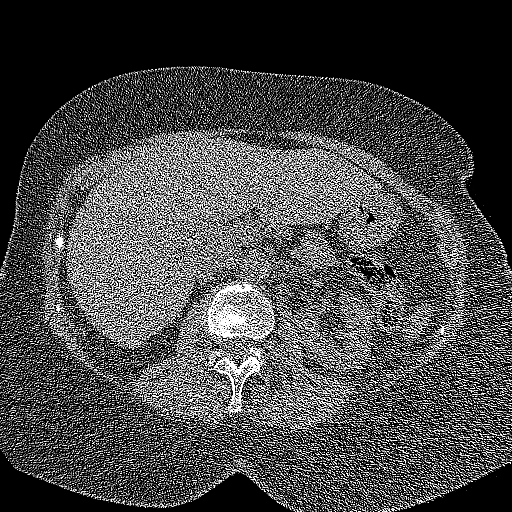
[im 16/333  lung]
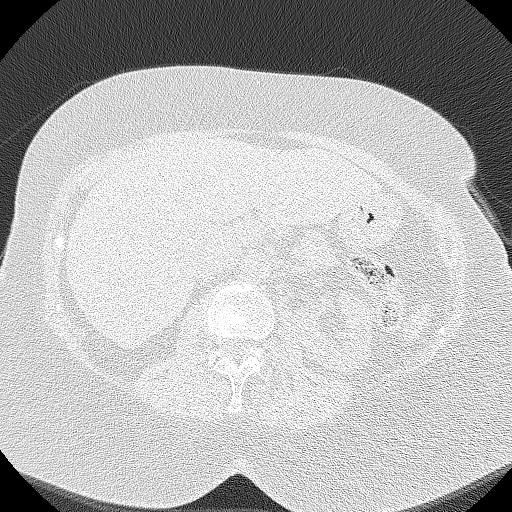
[im 48/333  lung]
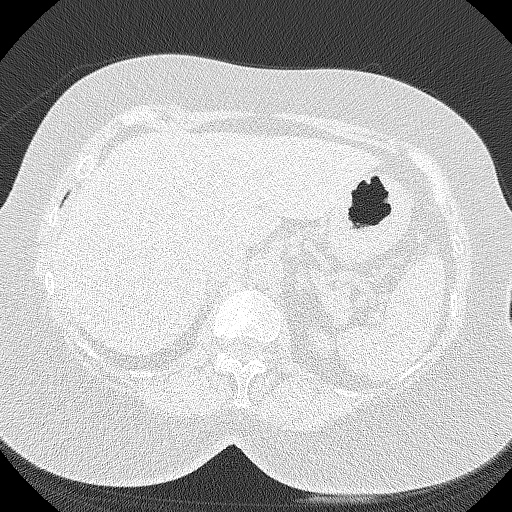
[im 80/333  lung]
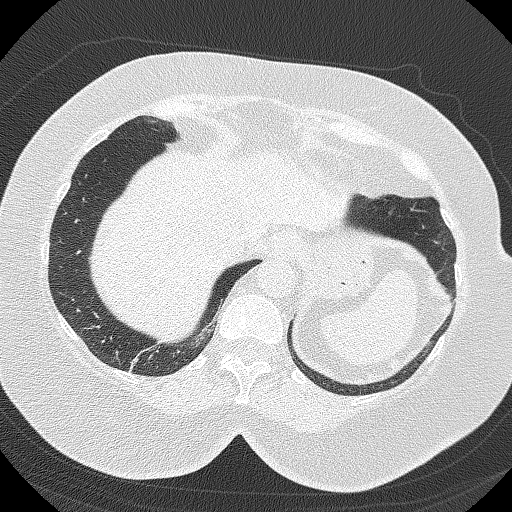
[im 95/333  lung]
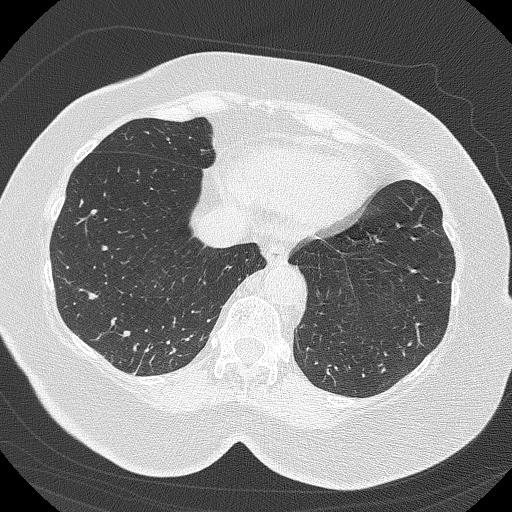
[im 127/333  mediastinal]
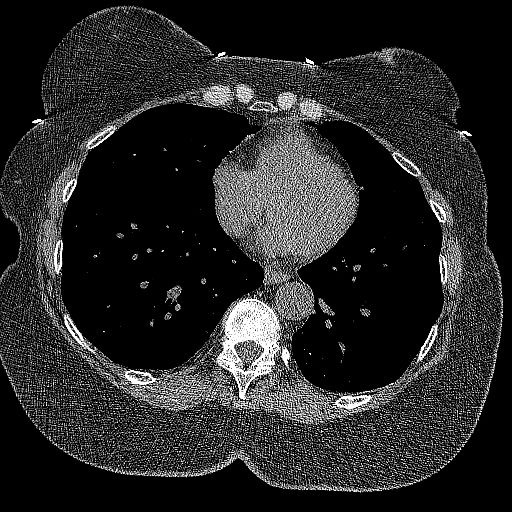
[im 127/333  lung]
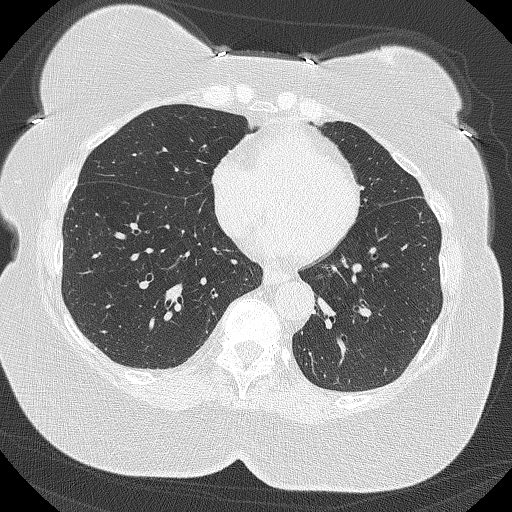
[im 159/333  lung]
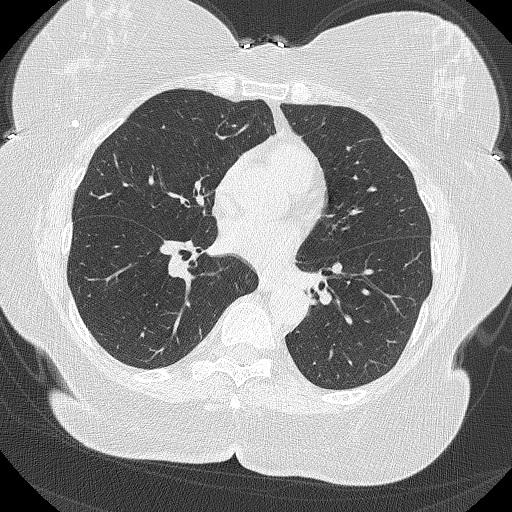
[im 174/333  lung]
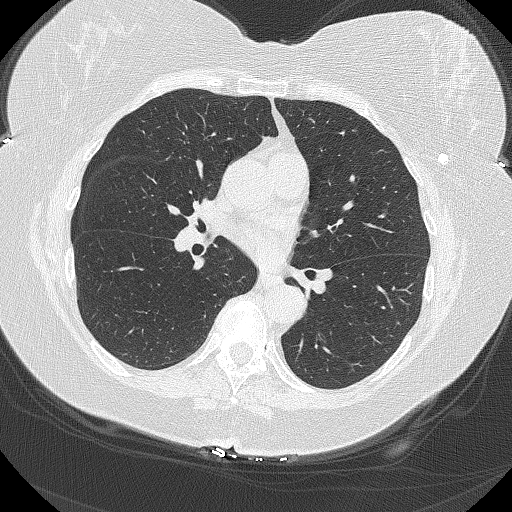
[im 206/333  lung]
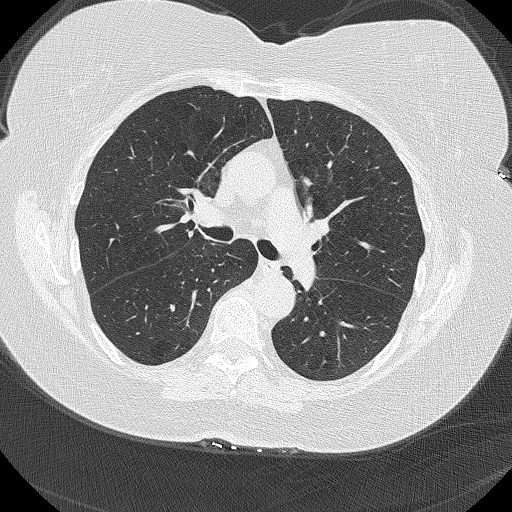
[im 238/333  mediastinal]
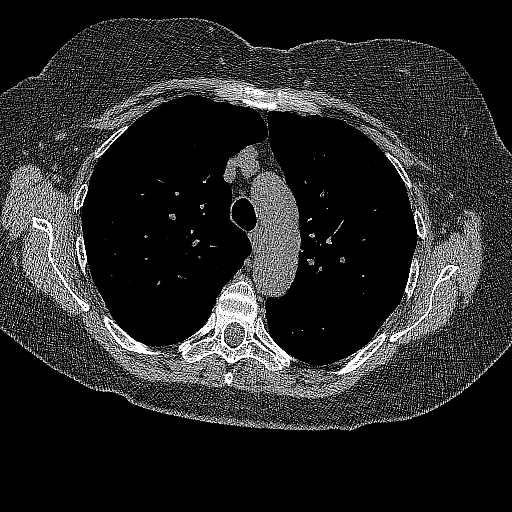
[im 238/333  lung]
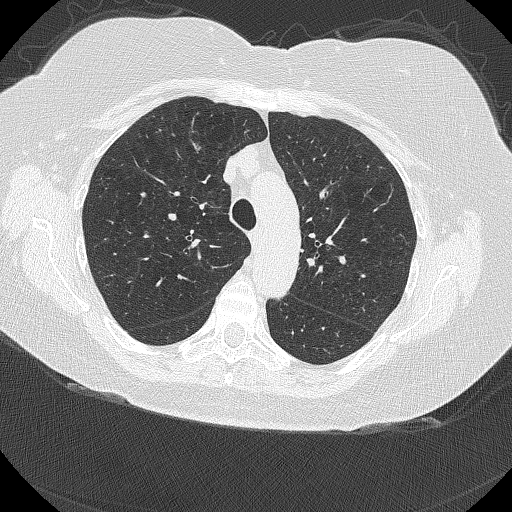
[im 253/333  lung]
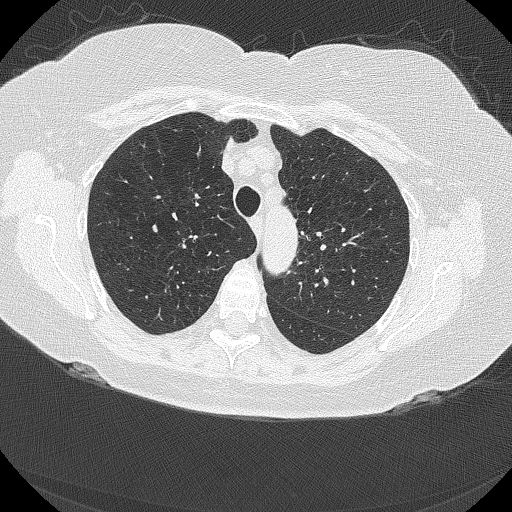
[im 285/333  lung]
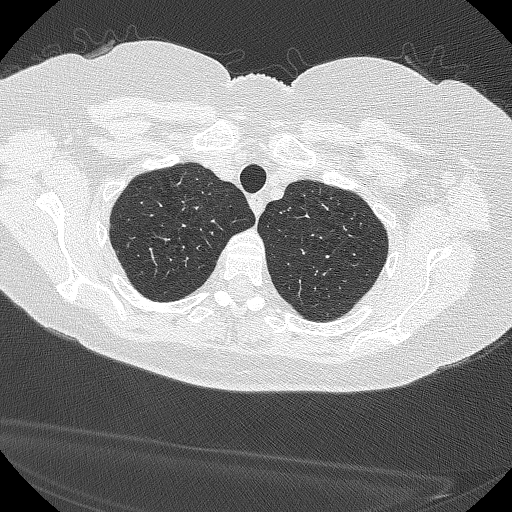
[im 317/333  lung]
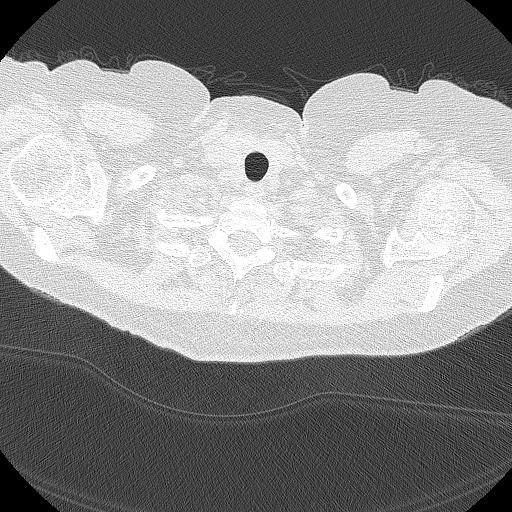

[Series 4: lung 1.00 br44 cor · coronal · 0.63mm/px · 3 of 301 slices shown]
[im 61/301  lung]
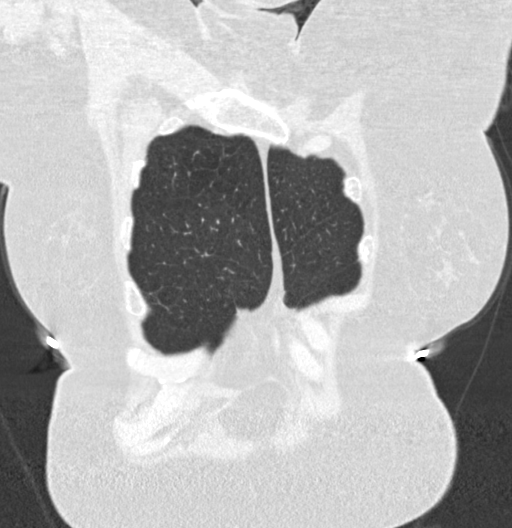
[im 121/301  lung]
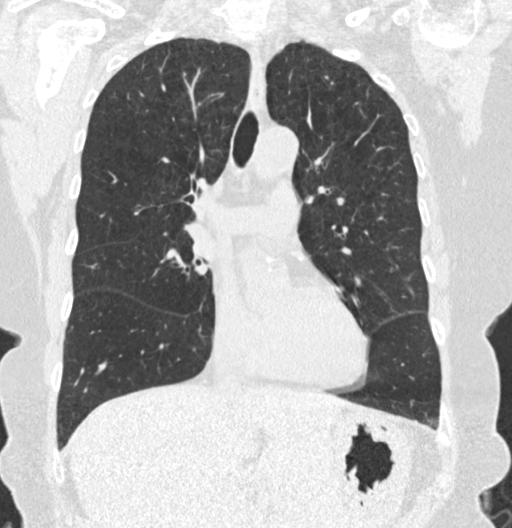
[im 181/301  lung]
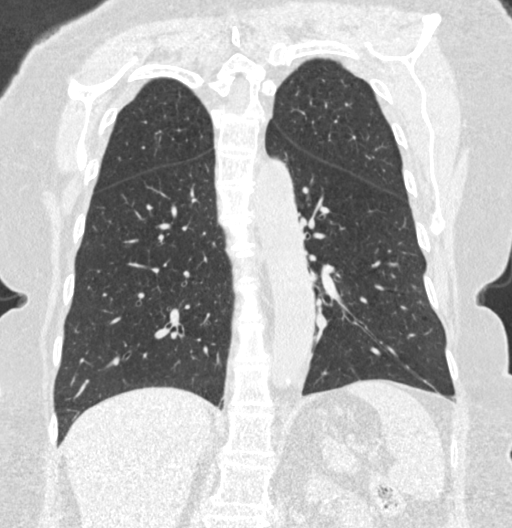

[15 of 40 positions shown; findings below may reference images not displayed]

FINDINGS: Cardiovascular: Normal heart size. Stable trace pericardial
thickening/effusion anteriorly. Left anterior descending coronary
atherosclerosis. Mildly atherosclerotic nonaneurysmal thoracic
aorta. Normal caliber pulmonary arteries.

Mediastinum/Nodes: No discrete thyroid nodules. Unremarkable
esophagus. No pathologically enlarged axillary, mediastinal or gross
hilar lymph nodes, noting limited sensitivity for the detection of
hilar adenopathy on this noncontrast study.

Lungs/Pleura: No pneumothorax. No pleural effusion. Moderate
centrilobular emphysema with mild diffuse bronchial wall thickening.
No acute consolidative airspace disease or lung masses. Previously
described tiny scattered pulmonary nodules are either stable or
decreased in size. No new significant pulmonary nodules.

Upper abdomen: No acute abnormality.

Musculoskeletal: No aggressive appearing focal osseous lesions. Mild
thoracic spondylosis.
IMPRESSION: 1. Lung-RADS 2, benign appearance or behavior. Continue annual
screening with low-dose chest CT without contrast in 12 months.
2. One vessel coronary atherosclerosis.

Aortic Atherosclerosis ([9C]-[9C]) and Emphysema ([9C]-[9C]).

## 2017-09-07 ENCOUNTER — Encounter: Payer: Self-pay | Admitting: *Deleted

## 2017-09-10 ENCOUNTER — Ambulatory Visit
Admission: RE | Admit: 2017-09-10 | Discharge: 2017-09-10 | Disposition: A | Discharge status: Home / Self Care | Payer: Medicare HMO | Source: Ambulatory Visit | Attending: Internal Medicine | Admitting: Internal Medicine

## 2017-09-10 ENCOUNTER — Other Ambulatory Visit: Payer: Self-pay | Admitting: Internal Medicine

## 2017-09-10 DIAGNOSIS — Z1239 Encounter for other screening for malignant neoplasm of breast: Secondary | ICD-10-CM

## 2017-09-10 DIAGNOSIS — Z1231 Encounter for screening mammogram for malignant neoplasm of breast: Secondary | ICD-10-CM | POA: Insufficient documentation

## 2017-09-10 IMAGING — MG MM DIGITAL SCREENING BILAT W/ TOMO W/ CAD
8 of 12 series · 8 of 28 positions shown · non-contrast
Comparison: Previous exam(s).

CLINICAL DATA: Screening.

EXAM:
DIGITAL SCREENING BILATERAL MAMMOGRAM WITH TOMO AND CAD

[R MLO synth-2D]
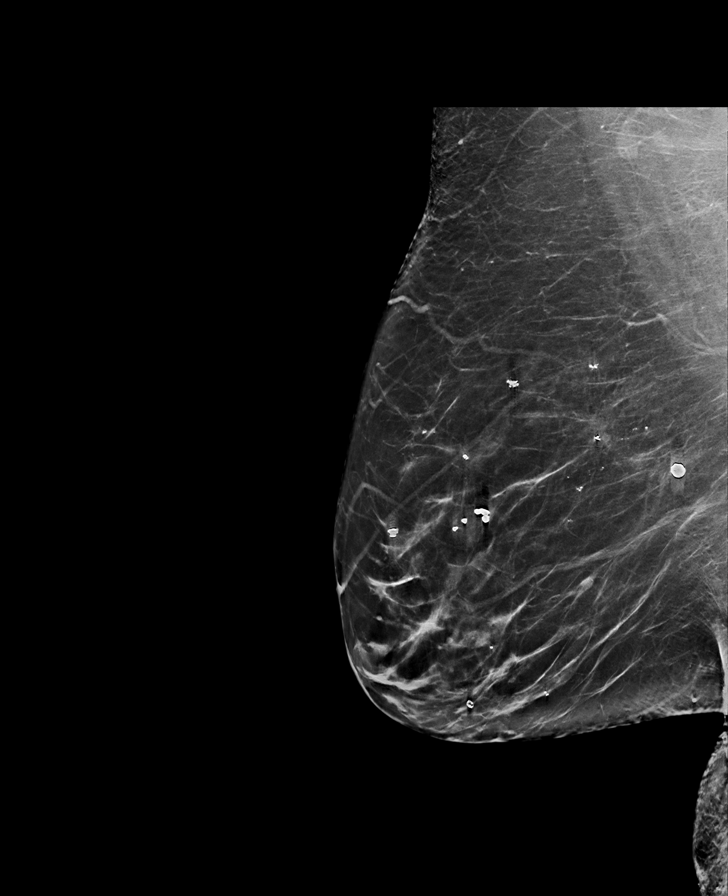

[R CC synth-2D]
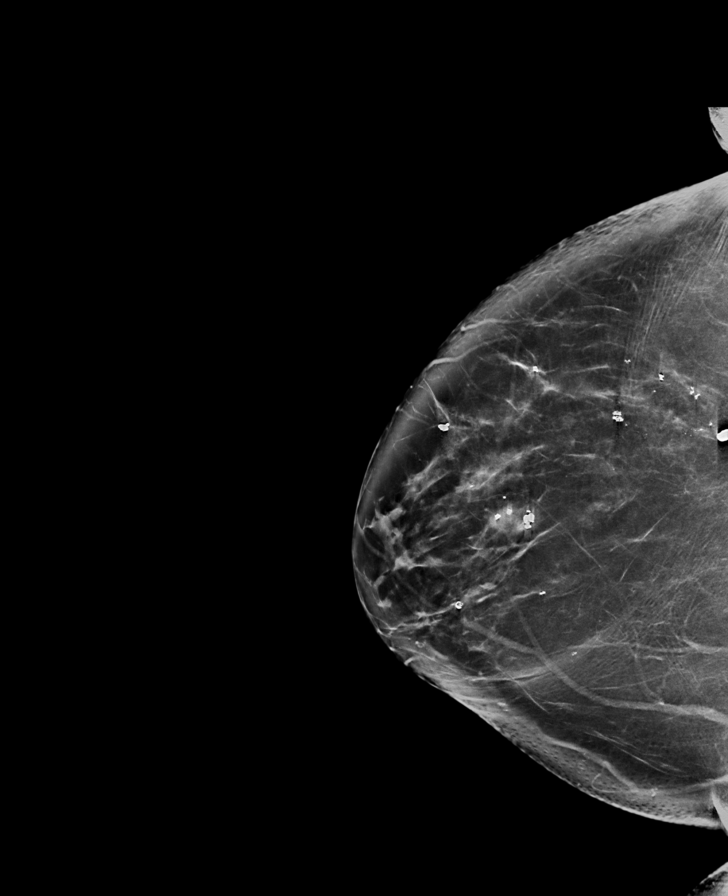

[L CC synth-2D]
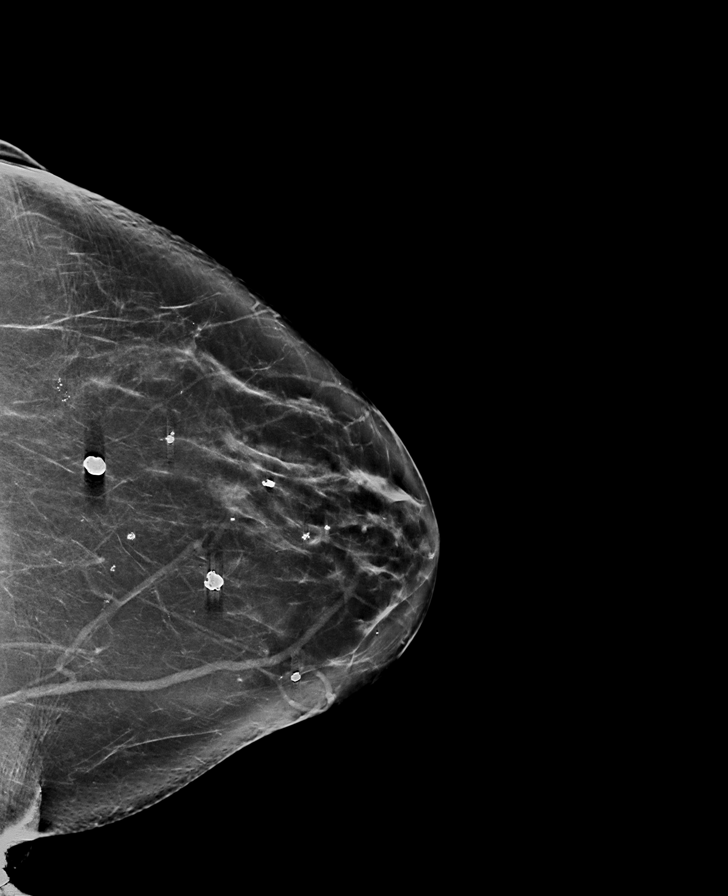

[L CC]
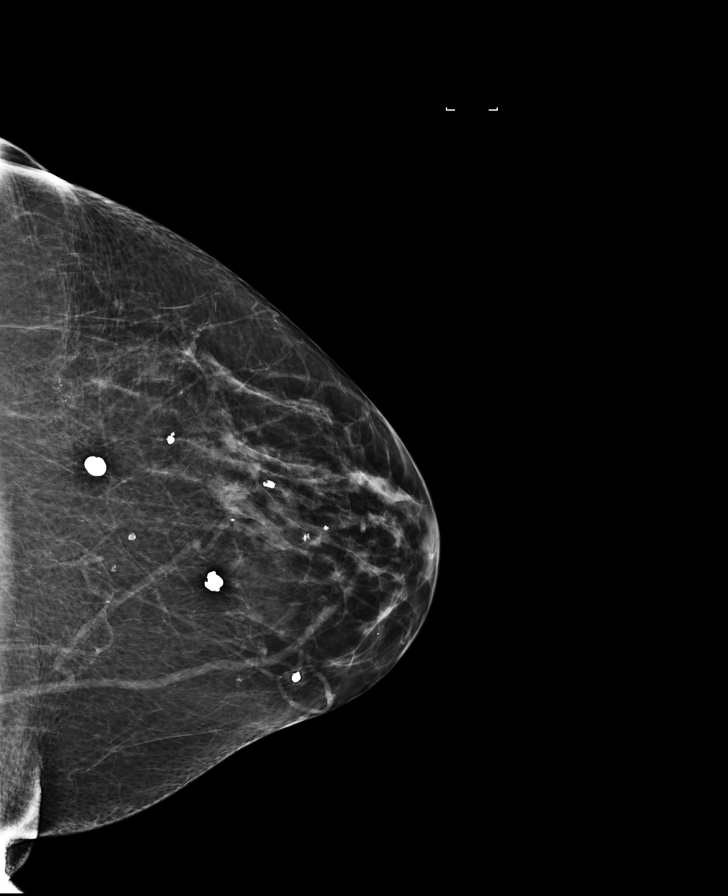

[L MLO]
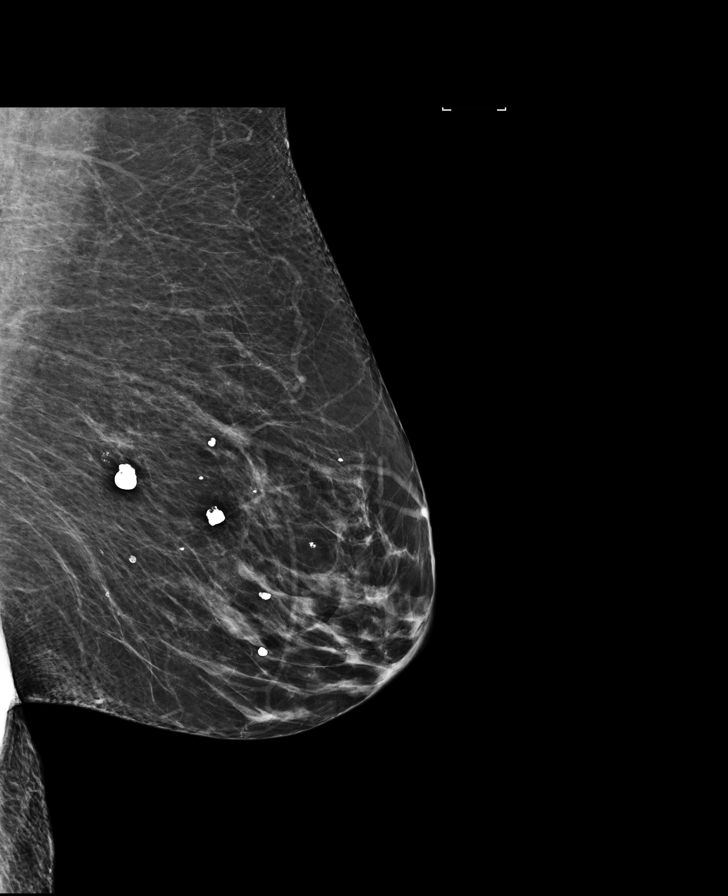

[L MLO synth-2D]
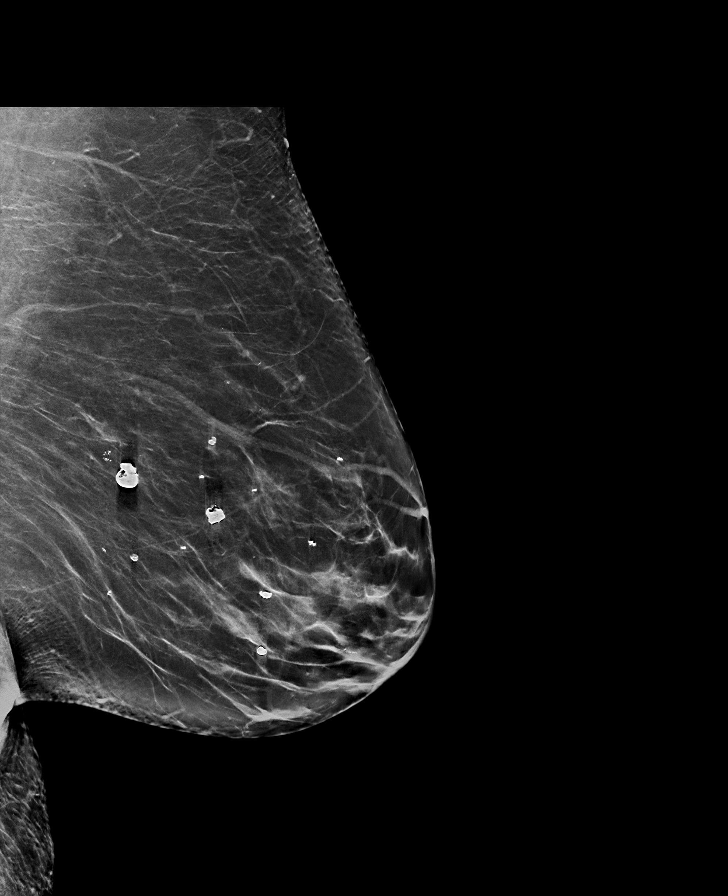

[R CC]
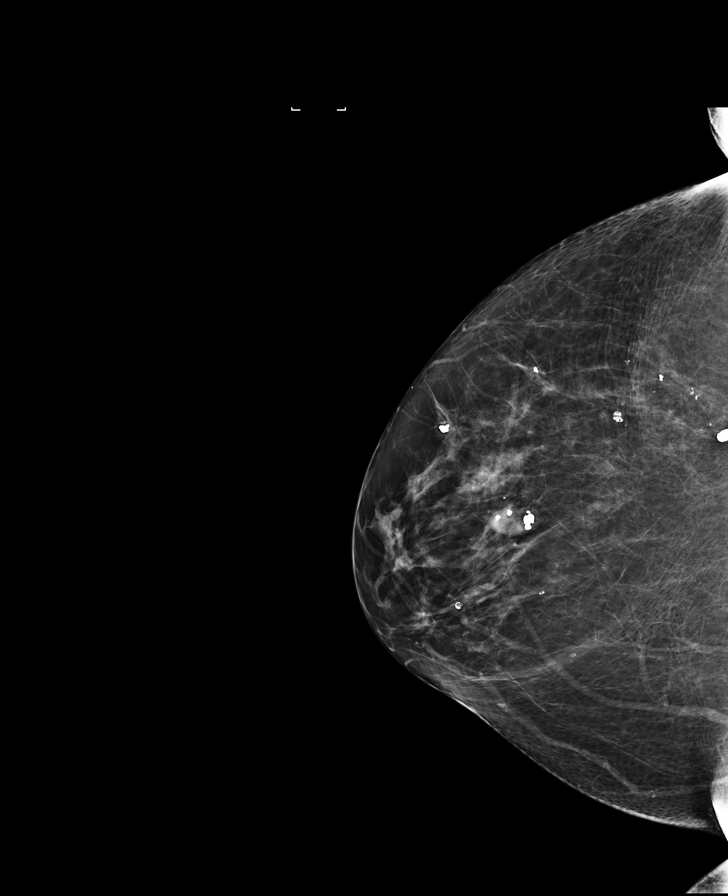

[R MLO]
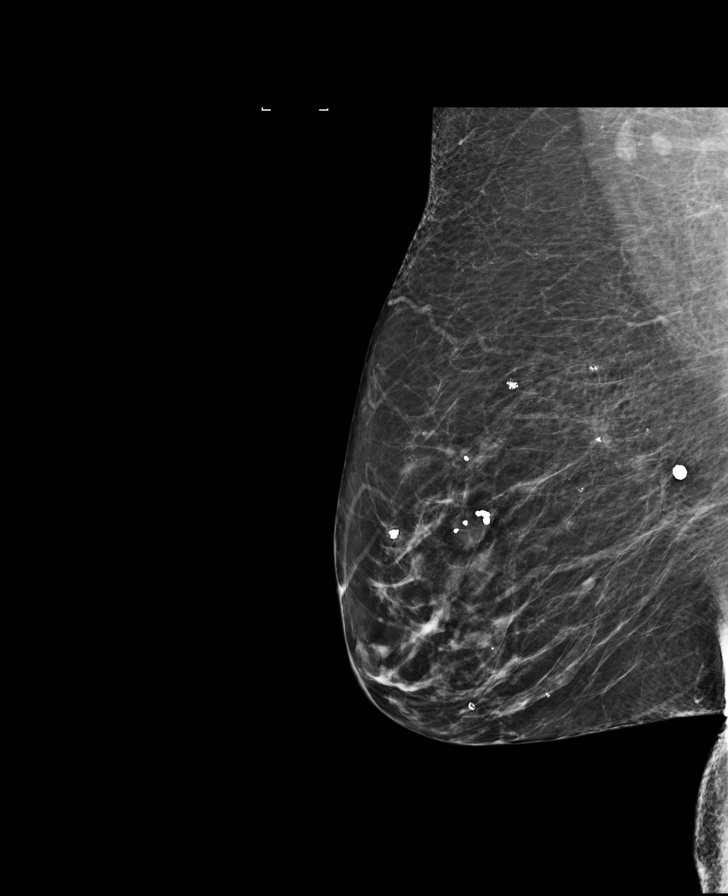

[8 of 28 positions shown; findings below may reference images not displayed]

ACR Breast Density Category b: There are scattered areas of
fibroglandular density.
FINDINGS: There are no findings suspicious for malignancy. Images were
processed with CAD.
IMPRESSION: No mammographic evidence of malignancy. A result letter of this
screening mammogram will be mailed directly to the patient.

RECOMMENDATION:
Screening mammogram in one year. (Code:[TQ])

BI-RADS CATEGORY  1: Negative.

## 2017-09-23 ENCOUNTER — Other Ambulatory Visit: Payer: Self-pay | Admitting: Internal Medicine

## 2017-11-05 DIAGNOSIS — M25861 Other specified joint disorders, right knee: Secondary | ICD-10-CM | POA: Diagnosis not present

## 2017-11-05 DIAGNOSIS — M1711 Unilateral primary osteoarthritis, right knee: Secondary | ICD-10-CM | POA: Diagnosis not present

## 2017-11-05 DIAGNOSIS — M25561 Pain in right knee: Secondary | ICD-10-CM | POA: Diagnosis not present

## 2017-11-18 ENCOUNTER — Ambulatory Visit
Admission: EM | Admit: 2017-11-18 | Discharge: 2017-11-18 | Disposition: A | Discharge status: Home / Self Care | Payer: Medicare HMO | Attending: Family Medicine | Admitting: Family Medicine

## 2017-11-18 DIAGNOSIS — J441 Chronic obstructive pulmonary disease with (acute) exacerbation: Secondary | ICD-10-CM

## 2017-11-18 MED ORDER — HYDROCOD POLST-CPM POLST ER 10-8 MG/5ML PO SUER: 5.0000 mL | Freq: Two times a day (BID) | ORAL | 0 refills | Status: DC | PRN

## 2017-11-18 MED ORDER — DOXYCYCLINE HYCLATE 100 MG PO CAPS: 100.0000 mg | ORAL_CAPSULE | Freq: Two times a day (BID) | ORAL | 0 refills | Status: DC

## 2017-11-18 NOTE — ED Provider Notes (Signed)
[facility]  CSN: [id number] Arrival date & time: 11/18/17  1346  History   Chief Complaint Chief Complaint  Patient presents with  . Cough   HPI  72 year old female presents with respiratory symptoms.  Patient has known COPD.  She states she has been sick for the past few days.  Has had productive cough, sinus congestion and chest congestion.  Associated shortness of breath.  No fever.  No chills.  She is been taking Zyrtec-D and Robitussin with some improvement.  Symptoms persist however.  Symptoms are worsening.  No other associated symptoms.  No other complaints.  Past Medical History:  Diagnosis Date  . Arthritis   . Benign breast lumps    multiple lumps biopsy and remove x's 5 last being 1979 by Dr. Sharlet Salina  . Fibroids    s/p hysterectomy  . Hypercholesterolemia   . Osteoporosis   . Personal history of tobacco use, presenting hazards to health 08/07/2015   Patient Active Problem List   Diagnosis Date Noted  . History of colonic polyps 02/04/2016  . Personal history of tobacco use, presenting hazards to health 08/07/2015  . Health care maintenance 11/18/2014  . Breast calcifications on mammogram 10/18/2014  . Body mass index (bmi) 32.0-32.9, adult 07/23/2014  . GOLD GRADE A COPD with Centrilobular Emphysema 04/26/2014  . Tobacco use disorder 04/26/2014  . Abnormal mammogram 04/12/2014  . Stress 02/25/2014  . Internal hemorrhoids without complication 98/33/8250  . Osteoporosis 06/18/2012  . GERD (gastroesophageal reflux disease) 06/18/2012  . Hypercholesterolemia 06/18/2012   Past Surgical History:  Procedure Laterality Date  . ABDOMINAL HYSTERECTOMY  1997   with bilateral oophorectomy  . BREAST BIOPSY Right 1975   multiple biopsies done  . BREAST BIOPSY Left    multiple done  . BREAST BIOPSY Right 2016   stereo  . BREAST SURGERY Left    lump removed. Unsure of date  . BREAST SURGERY Right    lump removed. Unsure of date  . COLONOSCOPY  2010   Dr. Vira Agar  . TONSILLECTOMY     OB History    Gravida  2   Para  2   Term      Preterm      AB      Living  2     SAB      TAB      Ectopic      Multiple      Live Births           Obstetric Comments  1st Menstrual Cycle:  12 1st Pregnancy: 30       Home Medications    Prior to Admission medications   Medication Sig Start Date End Date Taking? Authorizing Provider  Calcium Carbonate-Vitamin D (CALCIUM 600-D) 600-400 MG-UNIT per tablet Take 1 tablet by mouth 2 (two) times daily.   Yes [provider]  Cholecalciferol (VITAMIN D) 2000 UNITS tablet Take 2,000 Units by mouth daily.   Yes [provider]  Denosumab (PROLIA [place]) Inject into the skin. Injection twice a year   Yes [provider]  Ipratropium-Albuterol (COMBIVENT RESPIMAT) 20-100 MCG/ACT AERS respimat Inhale 1 puff into the lungs every 6 (six) hours as needed for wheezing or shortness of breath. 01/23/16  Yes Einar Pheasant, MD  omeprazole (PRILOSEC) 20 MG capsule TAKE 1 CAPSULE BY MOUTH ONCE DAILY 09/23/17  Yes Einar Pheasant, MD  rosuvastatin (CRESTOR) 5 MG tablet Take 1 tablet (5 mg total) by mouth daily. 07/29/17  Yes Scott,  Randell Patient, MD  chlorpheniramine-HYDROcodone (TUSSIONEX PENNKINETIC ER) 10-8 MG/5ML SUER Take 5 mLs by mouth every 12 (twelve) hours as needed. 11/18/17   Coral Spikes, DO  doxycycline (VIBRAMYCIN) 100 MG capsule Take 1 capsule (100 mg total) by mouth 2 (two) times daily. 11/18/17   Coral Spikes, DO  mupirocin ointment Drue Stager) 2 % Apply to affected area bid 01/20/17   Einar Pheasant, MD    Family History Family History  Problem Relation Age of Onset  . Colon cancer Father   . Stroke Father   . Heart disease Father        myocardial infarction  . Breast cancer Neg Hx     Social History Social History   Tobacco Use  . Smoking status: Former Smoker    Packs/day: 1.00    Years: 30.00    Pack years: 30.00    Types: Cigarettes    Last attempt to  quit: 04/26/2004    Years since quitting: 13.5  . Smokeless tobacco: Never Used  Substance Use Topics  . Alcohol use: Yes    Alcohol/week: 0.0 oz    Comment: occassional  . Drug use: No     Allergies   Alendronate; Boniva [ibandronic acid]; Codeine; Fosamax [alendronate sodium]; Lipitor [atorvastatin]; Other; and Penicillins   Review of Systems Review of Systems  HENT: Positive for congestion.   Respiratory: Positive for cough.    Physical Exam Triage Vital Signs ED Triage Vitals  Enc Vitals Group     BP 11/18/17 1415 124/79     Pulse Rate 11/18/17 1415 (!) 109     Resp 11/18/17 1415 18     Temp 11/18/17 1415 98.1 F (36.7 C)     Temp Source 11/18/17 1415 Oral     SpO2 11/18/17 1415 99 %     Weight 11/18/17 1417 190 lb (86.2 kg)     Height --      Head Circumference --      Peak Flow --      Pain Score 11/18/17 1417 0     Pain Loc --      Pain Edu? --      Excl. in [place]? --    Updated Vital Signs BP 124/79 (BP Location: Right Arm)   Pulse (!) 109   Temp 98.1 F (36.7 C) (Oral)   Resp 18   Wt 190 lb (86.2 kg)   LMP 07/20/1995   SpO2 99%   BMI 33.66 kg/m   Physical Exam  Constitutional: She is oriented to person, place, and time. She appears well-developed. No distress.  HENT:  Head: Normocephalic and atraumatic.  Mouth/Throat: Oropharynx is clear and moist.  Cardiovascular: Normal rate and regular rhythm.  Pulmonary/Chest: Effort normal and breath sounds normal.  Neurological: She is alert and oriented to person, place, and time.  Psychiatric: She has a normal mood and affect. Her behavior is normal.  Nursing note and vitals reviewed.  UC Treatments / Results  Labs (all labs ordered are listed, but only abnormal results are displayed) Labs Reviewed - No data to display  EKG None  Radiology No results found.  Procedures Procedures (including critical care time)  Medications Ordered in UC Medications - No data to display  Initial Impression /  Assessment and Plan / UC Course  I have reviewed the triage vital signs and the nursing notes.  Pertinent labs & imaging results that were available during my care of the patient were reviewed by me and considered in  my medical decision making (see chart for details).    72 year old female presents with a COPD exacerbation.  Treating with doxycycline.  Tussionex for cough.  Given clear lungs, will not proceed with prednisone at this time.  Final Clinical Impressions(s) / UC Diagnoses   Final diagnoses:  COPD exacerbation ([place])     Discharge Instructions     Antibiotic as prescribed.  Take care  Dr. Lacinda Axon    ED Prescriptions    Medication Sig Dispense Auth. Provider   doxycycline (VIBRAMYCIN) 100 MG capsule Take 1 capsule (100 mg total) by mouth 2 (two) times daily. 14 capsule Briggs, Holbrook G, DO   chlorpheniramine-HYDROcodone (TUSSIONEX PENNKINETIC ER) 10-8 MG/5ML SUER Take 5 mLs by mouth every 12 (twelve) hours as needed. 115 mL Coral Spikes, DO     Controlled Substance Prescriptions [place] Controlled Substance Registry consulted? Not Applicable   Coral Spikes, DO 11/18/17 1520

## 2017-11-18 NOTE — ED Triage Notes (Signed)
Pt here because she has been sick for 2 days. Productive cough with yellow mucus. Nasal drainage, headache and low grade fever last night. Taking zyrtec, robatussin and tylenol. Without relief.

## 2017-11-18 NOTE — Discharge Instructions (Signed)
Antibiotic as prescribed.  Take care  Dr. Karlena Luebke  

## 2017-12-30 DIAGNOSIS — M810 Age-related osteoporosis without current pathological fracture: Secondary | ICD-10-CM | POA: Diagnosis not present

## 2018-01-11 ENCOUNTER — Other Ambulatory Visit: Payer: Self-pay | Admitting: Internal Medicine

## 2018-01-27 ENCOUNTER — Other Ambulatory Visit (INDEPENDENT_AMBULATORY_CARE_PROVIDER_SITE_OTHER): Payer: Medicare HMO

## 2018-01-27 DIAGNOSIS — E7800 Pure hypercholesterolemia, unspecified: Secondary | ICD-10-CM | POA: Diagnosis not present

## 2018-01-27 LAB — LIPID PANEL
Cholesterol: 164 mg/dL (ref 0–200)
HDL: 55.7 mg/dL (ref >39.00)
LDL Cholesterol: 87 mg/dL (ref 0–99)
NonHDL: 108.7
Total CHOL/HDL Ratio: 3
Triglycerides: 111 mg/dL (ref 0.0–149.0)
VLDL: 22.2 mg/dL (ref 0.0–40.0)

## 2018-01-27 LAB — HEPATIC FUNCTION PANEL
ALT: 13 U/L (ref 0–35)
AST: 15 U/L (ref 0–37)
Albumin: 4.3 g/dL (ref 3.5–5.2)
Alkaline Phosphatase: 62 U/L (ref 39–117)
Bilirubin, Direct: 0.1 mg/dL (ref 0.0–0.3)
Total Bilirubin: 0.6 mg/dL (ref 0.2–1.2)
Total Protein: 6.9 g/dL (ref 6.0–8.3)

## 2018-01-27 LAB — BASIC METABOLIC PANEL
BUN: 12 mg/dL (ref 6–23)
CO2: 32 mEq/L (ref 19–32)
Calcium: 9.6 mg/dL (ref 8.4–10.5)
Chloride: 102 mEq/L (ref 96–112)
Creatinine, Ser: 0.7 mg/dL (ref 0.40–1.20)
GFR: 87.95 mL/min (ref >60.00)
Glucose, Bld: 92 mg/dL (ref 70–99)
Potassium: 4.4 mEq/L (ref 3.5–5.1)
Sodium: 140 mEq/L (ref 135–145)

## 2018-01-28 ENCOUNTER — Encounter: Payer: Self-pay | Admitting: Internal Medicine

## 2018-02-03 ENCOUNTER — Ambulatory Visit (INDEPENDENT_AMBULATORY_CARE_PROVIDER_SITE_OTHER): Payer: Medicare HMO | Admitting: Internal Medicine

## 2018-02-03 ENCOUNTER — Encounter: Payer: Self-pay | Admitting: Internal Medicine

## 2018-02-03 VITALS — BP 122/78 | HR 73 | Temp 98.0°F | Resp 18 | Wt 194.4 lb

## 2018-02-03 DIAGNOSIS — K219 Gastro-esophageal reflux disease without esophagitis: Secondary | ICD-10-CM

## 2018-02-03 DIAGNOSIS — E7800 Pure hypercholesterolemia, unspecified: Secondary | ICD-10-CM

## 2018-02-03 DIAGNOSIS — J438 Other emphysema: Secondary | ICD-10-CM | POA: Diagnosis not present

## 2018-02-03 DIAGNOSIS — M810 Age-related osteoporosis without current pathological fracture: Secondary | ICD-10-CM

## 2018-02-03 DIAGNOSIS — R69 Illness, unspecified: Secondary | ICD-10-CM | POA: Diagnosis not present

## 2018-02-03 DIAGNOSIS — Z0000 Encounter for general adult medical examination without abnormal findings: Secondary | ICD-10-CM

## 2018-02-03 DIAGNOSIS — F439 Reaction to severe stress, unspecified: Secondary | ICD-10-CM

## 2018-02-03 DIAGNOSIS — F17200 Nicotine dependence, unspecified, uncomplicated: Secondary | ICD-10-CM

## 2018-02-03 NOTE — Progress Notes (Signed)
Patient ID: Alexis Reed, female   DOB: January 21, 1946, 72 y.o.   MRN: [id number]   Subjective:    Patient ID: Alexis Reed, female    DOB: Dec 08, 1945, 72 y.o.   MRN: [id number]  HPI  Patient here for her physical exam.  Increased stress with her mother's health issues.  Discussed with her today.  Overall she feels she is handling things relatively well.  Does not feel needs any further intervention.  No chest pain.  Breathing stable.  Had URI 11/2017.  Better now.  Resolved.  No acid reflux.  No abdominal pain.  Bowels moving.  S/p knee injection 10/2017.  Helped.  Aggravated - when painting.  Planning to f/u end of this week with ortho.  Receiving Prolia.     Past Medical History:  Diagnosis Date  . Arthritis   . Benign breast lumps    multiple lumps biopsy and remove x's 5 last being 1979 by Dr. Sharlet Salina  . Fibroids    s/p hysterectomy  . Hypercholesterolemia   . Osteoporosis   . Personal history of tobacco use, presenting hazards to health 08/07/2015   Past Surgical History:  Procedure Laterality Date  . ABDOMINAL HYSTERECTOMY  1997   with bilateral oophorectomy  . BREAST BIOPSY Right 1975   multiple biopsies done  . BREAST BIOPSY Left    multiple done  . BREAST BIOPSY Right 2016   stereo  . BREAST SURGERY Left    lump removed. Unsure of date  . BREAST SURGERY Right    lump removed. Unsure of date  . COLONOSCOPY  2010   Dr. Vira Agar  . TONSILLECTOMY     Family History  Problem Relation Age of Onset  . Colon cancer Father   . Stroke Father   . Heart disease Father        myocardial infarction  . Breast cancer Neg Hx    Social History   Socioeconomic History  . Marital status: Married    Spouse name: Not on file  . Number of children: 2  . Years of education: Not on file  . Highest education level: Not on file  Occupational History  . Not on file  Social Needs  . Financial resource strain: Not on file  . Food insecurity:    Worry: Not on file    Inability:  Not on file  . Transportation needs:    Medical: Not on file    Non-medical: Not on file  Tobacco Use  . Smoking status: Former Smoker    Packs/day: 1.00    Years: 30.00    Pack years: 30.00    Types: Cigarettes    Last attempt to quit: 04/26/2004    Years since quitting: 13.7  . Smokeless tobacco: Never Used  Substance and Sexual Activity  . Alcohol use: Yes    Alcohol/week: 0.0 oz    Comment: occassional  . Drug use: No  . Sexual activity: Not on file  Lifestyle  . Physical activity:    Days per week: Not on file    Minutes per session: Not on file  . Stress: Not on file  Relationships  . Social connections:    Talks on phone: Not on file    Gets together: Not on file    Attends religious service: Not on file    Active member of club or organization: Not on file    Attends meetings of clubs or organizations: Not on file    Relationship status:  Not on file  Other Topics Concern  . Not on file  Social History Narrative   She is married, has two children. Works at [employer]    Outpatient Encounter Medications as of 02/03/2018  Medication Sig  . Calcium Carbonate-Vitamin D (CALCIUM 600-D) 600-400 MG-UNIT per tablet Take 1 tablet by mouth 2 (two) times daily.  . Cholecalciferol (VITAMIN D) 2000 UNITS tablet Take 2,000 Units by mouth daily.  . Denosumab (PROLIA [place]) Inject into the skin. Injection twice a year  . omeprazole (PRILOSEC) 20 MG capsule TAKE 1 CAPSULE BY MOUTH ONCE DAILY  . rosuvastatin (CRESTOR) 5 MG tablet Take 1 tablet (5 mg total) by mouth daily.  . [DISCONTINUED] chlorpheniramine-HYDROcodone (TUSSIONEX PENNKINETIC ER) 10-8 MG/5ML SUER Take 5 mLs by mouth every 12 (twelve) hours as needed.  . [DISCONTINUED] COMBIVENT RESPIMAT 20-100 MCG/ACT AERS respimat INHALE 1 PUFF INTO THE LUNG EVERY 6 HOURS AS NEEDED FOR WHEEZING OR SHORTNESS OF BREATH  . [DISCONTINUED] doxycycline (VIBRAMYCIN) 100 MG capsule Take 1 capsule (100 mg total) by  mouth 2 (two) times daily.  . [DISCONTINUED] mupirocin ointment (BACTROBAN) 2 % Apply to affected area bid   No facility-administered encounter medications on file as of 02/03/2018.     Review of Systems  Constitutional: Negative for appetite change, fatigue and unexpected weight change.  HENT: Negative for congestion, sinus pressure and sore throat.   Eyes: Negative for pain and visual disturbance.  Respiratory: Negative for cough, chest tightness and shortness of breath.   Cardiovascular: Negative for chest pain, palpitations and leg swelling.  Gastrointestinal: Negative for abdominal pain, diarrhea, nausea and vomiting.  Genitourinary: Negative for difficulty urinating and dysuria.  Musculoskeletal: Negative for myalgias.       Knee pain as outlined.    Skin: Negative for color change and rash.  Neurological: Negative for dizziness, light-headedness and headaches.  Hematological: Negative for adenopathy. Does not bruise/bleed easily.  Psychiatric/Behavioral: Negative for agitation and dysphoric mood.       Increased stress as outlined.         Objective:     Blood pressure rechecked by me:  128/78  Physical Exam  Constitutional: She is oriented to person, place, and time. She appears well-developed and well-nourished. No distress.  HENT:  Nose: Nose normal.  Mouth/Throat: Oropharynx is clear and moist.  Eyes: Right eye exhibits no discharge. Left eye exhibits no discharge. No scleral icterus.  Neck: Neck supple. No thyromegaly present.  Cardiovascular: Normal rate and regular rhythm.  Pulmonary/Chest: Breath sounds normal. No accessory muscle usage. No tachypnea. No respiratory distress. She has no decreased breath sounds. She has no wheezes. She has no rhonchi. Right breast exhibits no inverted nipple, no mass, no nipple discharge and no tenderness (no axillary adenopathy). Left breast exhibits no inverted nipple, no mass, no nipple discharge and no tenderness (no axilarry  adenopathy).  Abdominal: Soft. Bowel sounds are normal. There is no tenderness.  Musculoskeletal: She exhibits no edema or tenderness.  Lymphadenopathy:    She has no cervical adenopathy.  Neurological: She is alert and oriented to person, place, and time.  Skin: No rash noted. No erythema.  Psychiatric: She has a normal mood and affect. Her behavior is normal.    BP 122/78 (BP Location: Left Arm, Patient Position: Sitting, Cuff Size: Normal)   Pulse 73   Temp 98 F (36.7 C) (Oral)   Resp 18   Wt 194 lb 6.4 oz (88.2 kg)   LMP 07/20/1995  SpO2 98%   BMI 34.44 kg/m  Wt Readings from Last 3 Encounters:  02/03/18 194 lb 6.4 oz (88.2 kg)  11/18/17 190 lb (86.2 kg)  09/02/17 192 lb (87.1 kg)     Lab Results  Component Value Date   WBC 5.7 07/23/2017   HGB 14.4 07/23/2017   HCT 43.9 07/23/2017   PLT 292.0 07/23/2017   GLUCOSE 92 01/27/2018   CHOL 164 01/27/2018   TRIG 111.0 01/27/2018   HDL 55.70 01/27/2018   LDLDIRECT 153.0 07/17/2015   LDLCALC 87 01/27/2018   ALT 13 01/27/2018   AST 15 01/27/2018   NA 140 01/27/2018   K 4.4 01/27/2018   CL 102 01/27/2018   CREATININE 0.70 01/27/2018   BUN 12 01/27/2018   CO2 32 01/27/2018   TSH 2.59 07/23/2017       Assessment & Plan:   Problem List Items Addressed This Visit    GERD (gastroesophageal reflux disease)    Controlled on current regimen.  Follow.        GOLD GRADE A COPD with Centrilobular Emphysema    Previously saw pulmonary.  Breathing stable.  Follow.        Health care maintenance    Physical today 02/03/18.  mammogarm 09/10/17 - [facility] I.  Colonoscopy 11/2014.  Recommended f/u in 11/2019.        Hypercholesterolemia    On crestor.  Low cholesterol diet and exercise.  Follow lipid panel and liver function tests.        Relevant Orders   Hepatic function panel   Lipid panel   Basic metabolic panel   Osteoporosis    Receiving prolia.  Followed by Dr Gabriel Carina.  Follow vitamin D level.        Stress     Discussed with her today.  Overall she feels she is handling things relatively well.  Does not feel needs any further intervention.  Follow.       Tobacco use disorder    Is followed with screening CT chest.         Other Visit Diagnoses    Routine general medical examination at a health care facility    -  Primary       Einar Pheasant, MD

## 2018-02-03 NOTE — Assessment & Plan Note (Addendum)
Physical today 02/03/18.  mammogarm 09/10/17 - [facility] I.  Colonoscopy 11/2014.  Recommended f/u in 11/2019.

## 2018-02-05 DIAGNOSIS — M1711 Unilateral primary osteoarthritis, right knee: Secondary | ICD-10-CM | POA: Diagnosis not present

## 2018-02-05 DIAGNOSIS — M25861 Other specified joint disorders, right knee: Secondary | ICD-10-CM | POA: Diagnosis not present

## 2018-02-06 ENCOUNTER — Encounter: Payer: Self-pay | Admitting: Internal Medicine

## 2018-02-06 NOTE — Assessment & Plan Note (Signed)
Controlled on current regimen.  Follow.  

## 2018-02-06 NOTE — Assessment & Plan Note (Signed)
Is followed with screening CT chest.

## 2018-02-06 NOTE — Assessment & Plan Note (Signed)
Receiving prolia.  Followed by Dr Gabriel Carina.  Follow vitamin D level.

## 2018-02-06 NOTE — Assessment & Plan Note (Signed)
On crestor.  Low cholesterol diet and exercise.  Follow lipid panel and liver function tests.   

## 2018-02-06 NOTE — Assessment & Plan Note (Signed)
Discussed with her today.  Overall she feels she is handling things relatively well.  Does not feel needs any further intervention.  Follow.   

## 2018-02-06 NOTE — Assessment & Plan Note (Signed)
Previously saw pulmonary.  Breathing stable.  Follow.

## 2018-03-31 DIAGNOSIS — L570 Actinic keratosis: Secondary | ICD-10-CM | POA: Diagnosis not present

## 2018-03-31 DIAGNOSIS — L853 Xerosis cutis: Secondary | ICD-10-CM | POA: Diagnosis not present

## 2018-03-31 DIAGNOSIS — Z1283 Encounter for screening for malignant neoplasm of skin: Secondary | ICD-10-CM | POA: Diagnosis not present

## 2018-03-31 DIAGNOSIS — Z872 Personal history of diseases of the skin and subcutaneous tissue: Secondary | ICD-10-CM | POA: Diagnosis not present

## 2018-03-31 DIAGNOSIS — L578 Other skin changes due to chronic exposure to nonionizing radiation: Secondary | ICD-10-CM | POA: Diagnosis not present

## 2018-04-23 ENCOUNTER — Other Ambulatory Visit: Payer: Self-pay | Admitting: Internal Medicine

## 2018-05-10 ENCOUNTER — Ambulatory Visit (INDEPENDENT_AMBULATORY_CARE_PROVIDER_SITE_OTHER): Payer: Medicare HMO

## 2018-05-10 DIAGNOSIS — Z23 Encounter for immunization: Secondary | ICD-10-CM | POA: Diagnosis not present

## 2018-06-08 ENCOUNTER — Other Ambulatory Visit: Payer: Self-pay | Admitting: Internal Medicine

## 2018-07-02 DIAGNOSIS — M810 Age-related osteoporosis without current pathological fracture: Secondary | ICD-10-CM | POA: Diagnosis not present

## 2018-07-14 DIAGNOSIS — N3001 Acute cystitis with hematuria: Secondary | ICD-10-CM | POA: Diagnosis not present

## 2018-07-14 DIAGNOSIS — Z87891 Personal history of nicotine dependence: Secondary | ICD-10-CM | POA: Diagnosis not present

## 2018-07-19 ENCOUNTER — Encounter: Payer: Self-pay | Admitting: Internal Medicine

## 2018-07-19 MED ORDER — FLUCONAZOLE 150 MG PO TABS: ORAL_TABLET | ORAL | 0 refills | Status: DC

## 2018-07-19 NOTE — Telephone Encounter (Signed)
rx ok'd for diflucan #2 with no refills.

## 2018-07-19 NOTE — Telephone Encounter (Signed)
Okay to send in [place] or would you prefer her be seen?

## 2018-07-30 DIAGNOSIS — M25551 Pain in right hip: Secondary | ICD-10-CM | POA: Diagnosis not present

## 2018-07-30 DIAGNOSIS — M7061 Trochanteric bursitis, right hip: Secondary | ICD-10-CM | POA: Diagnosis not present

## 2018-07-31 ENCOUNTER — Other Ambulatory Visit: Payer: Self-pay | Admitting: Internal Medicine

## 2018-08-04 ENCOUNTER — Other Ambulatory Visit (INDEPENDENT_AMBULATORY_CARE_PROVIDER_SITE_OTHER): Payer: Medicare HMO

## 2018-08-04 DIAGNOSIS — E7800 Pure hypercholesterolemia, unspecified: Secondary | ICD-10-CM

## 2018-08-04 LAB — BASIC METABOLIC PANEL
BUN: 20 mg/dL (ref 6–23)
CO2: 30 mEq/L (ref 19–32)
Calcium: 9.9 mg/dL (ref 8.4–10.5)
Chloride: 101 mEq/L (ref 96–112)
Creatinine, Ser: 0.76 mg/dL (ref 0.40–1.20)
GFR: 75.15 mL/min (ref >60.00)
Glucose, Bld: 102 mg/dL — ABNORMAL HIGH (ref 70–99)
Potassium: 4.5 mEq/L (ref 3.5–5.1)
Sodium: 138 mEq/L (ref 135–145)

## 2018-08-04 LAB — HEPATIC FUNCTION PANEL
ALT: 11 U/L (ref 0–35)
AST: 12 U/L (ref 0–37)
Albumin: 4.5 g/dL (ref 3.5–5.2)
Alkaline Phosphatase: 61 U/L (ref 39–117)
Bilirubin, Direct: 0.1 mg/dL (ref 0.0–0.3)
Total Bilirubin: 0.5 mg/dL (ref 0.2–1.2)
Total Protein: 7.5 g/dL (ref 6.0–8.3)

## 2018-08-04 LAB — LIPID PANEL
Cholesterol: 174 mg/dL (ref 0–200)
HDL: 57.7 mg/dL (ref >39.00)
LDL Cholesterol: 97 mg/dL (ref 0–99)
NonHDL: 116.08
Total CHOL/HDL Ratio: 3
Triglycerides: 94 mg/dL (ref 0.0–149.0)
VLDL: 18.8 mg/dL (ref 0.0–40.0)

## 2018-08-05 ENCOUNTER — Encounter: Payer: Self-pay | Admitting: Internal Medicine

## 2018-08-09 ENCOUNTER — Ambulatory Visit: Payer: Medicare HMO | Admitting: Internal Medicine

## 2018-08-18 ENCOUNTER — Other Ambulatory Visit: Payer: Self-pay | Admitting: Internal Medicine

## 2018-08-18 ENCOUNTER — Ambulatory Visit: Payer: Medicare HMO | Admitting: Internal Medicine

## 2018-08-18 ENCOUNTER — Encounter: Payer: Self-pay | Admitting: Internal Medicine

## 2018-08-18 VITALS — BP 130/72 | HR 74 | Temp 97.7°F | Resp 16 | Wt 193.0 lb

## 2018-08-18 DIAGNOSIS — R1084 Generalized abdominal pain: Secondary | ICD-10-CM | POA: Diagnosis not present

## 2018-08-18 DIAGNOSIS — E7800 Pure hypercholesterolemia, unspecified: Secondary | ICD-10-CM | POA: Diagnosis not present

## 2018-08-18 DIAGNOSIS — J438 Other emphysema: Secondary | ICD-10-CM | POA: Diagnosis not present

## 2018-08-18 DIAGNOSIS — R69 Illness, unspecified: Secondary | ICD-10-CM | POA: Diagnosis not present

## 2018-08-18 DIAGNOSIS — R109 Unspecified abdominal pain: Secondary | ICD-10-CM | POA: Diagnosis not present

## 2018-08-18 DIAGNOSIS — K219 Gastro-esophageal reflux disease without esophagitis: Secondary | ICD-10-CM

## 2018-08-18 DIAGNOSIS — Z1231 Encounter for screening mammogram for malignant neoplasm of breast: Secondary | ICD-10-CM | POA: Diagnosis not present

## 2018-08-18 DIAGNOSIS — M810 Age-related osteoporosis without current pathological fracture: Secondary | ICD-10-CM | POA: Diagnosis not present

## 2018-08-18 DIAGNOSIS — F439 Reaction to severe stress, unspecified: Secondary | ICD-10-CM

## 2018-08-18 NOTE — Progress Notes (Signed)
Patient ID: Alexis Reed, female   DOB: [birth year], 73 y.o.   MRN: [id number]   Subjective:    Patient ID: Alexis Reed, female    DOB: [birth year], 73 y.o.   MRN: [id number]  HPI  Patient here for a scheduled follow up.  She reports she is doing relatively well.  She reports that she has noticed some abdominal discomfort - LUQ.  Has noticed since Labor Day.  Has been intermittent.  Recently has been more frequent.  Previously had diarrhea when on omnicef.  This has resolved.  No vomiting.  Is eating.  No chest pain.  Breathing stable.  No acid reflux reported.  S/p injection right hip.  Handling stress.  Due f/u chest CT.     Past Medical History:  Diagnosis Date  . Arthritis   . Benign breast lumps    multiple lumps biopsy and remove x's 5 last being 1979 by Dr. Sharlet Salina  . Fibroids    s/p hysterectomy  . Hypercholesterolemia   . Osteoporosis   . Personal history of tobacco use, presenting hazards to health 08/07/2015   Past Surgical History:  Procedure Laterality Date  . ABDOMINAL HYSTERECTOMY  1997   with bilateral oophorectomy  . BREAST BIOPSY Right 1975   multiple biopsies done  . BREAST BIOPSY Left    multiple done  . BREAST BIOPSY Right 2016   stereo  . BREAST SURGERY Left    lump removed. Unsure of date  . BREAST SURGERY Right    lump removed. Unsure of date  . COLONOSCOPY  2010   Dr. Vira Agar  . TONSILLECTOMY     Family History  Problem Relation Age of Onset  . Colon cancer Father   . Stroke Father   . Heart disease Father        myocardial infarction  . Breast cancer Neg Hx    Social History   Socioeconomic History  . Marital status: Married    Spouse name: Not on file  . Number of children: 2  . Years of education: Not on file  . Highest education level: Not on file  Occupational History  . Not on file  Social Needs  . Financial resource strain: Not on file  . Food insecurity:    Worry: Not on file    Inability: Not on file  .  Transportation needs:    Medical: Not on file    Non-medical: Not on file  Tobacco Use  . Smoking status: Former Smoker    Packs/day: 1.00    Years: 30.00    Pack years: 30.00    Types: Cigarettes    Last attempt to quit: 04/26/2004    Years since quitting: 14.3  . Smokeless tobacco: Never Used  Substance and Sexual Activity  . Alcohol use: Yes    Alcohol/week: 0.0 standard drinks    Comment: occassional  . Drug use: No  . Sexual activity: Not on file  Lifestyle  . Physical activity:    Days per week: Not on file    Minutes per session: Not on file  . Stress: Not on file  Relationships  . Social connections:    Talks on phone: Not on file    Gets together: Not on file    Attends religious service: Not on file    Active member of club or organization: Not on file    Attends meetings of clubs or organizations: Not on file    Relationship status: Not on file  Other Topics Concern  . Not on file  Social History Narrative   She is married, has two children. Works at [employer]    Outpatient Encounter Medications as of 08/18/2018  Medication Sig  . Calcium Carbonate-Vitamin D (CALCIUM 600-D) 600-400 MG-UNIT per tablet Take 1 tablet by mouth 2 (two) times daily.  . Cholecalciferol (VITAMIN D) 2000 UNITS tablet Take 2,000 Units by mouth daily.  . Denosumab (PROLIA [place]) Inject into the skin. Injection twice a year  . fluconazole (DIFLUCAN) 150 MG tablet Take one tablet x 1.  If persistent symptoms, may repeat x 1 in 3 days.  Marland Kitchen omeprazole (PRILOSEC) 20 MG capsule TAKE 1 CAPSULE BY MOUTH ONCE DAILY  . rosuvastatin (CRESTOR) 5 MG tablet TAKE 1 TABLET BY MOUTH ONCE DAILY   No facility-administered encounter medications on file as of 08/18/2018.     Review of Systems  Constitutional: Negative for appetite change and unexpected weight change.  HENT: Negative for congestion and sinus pressure.   Respiratory: Negative for cough, chest tightness and shortness of  breath.   Cardiovascular: Negative for chest pain, palpitations and leg swelling.  Gastrointestinal: Positive for abdominal pain. Negative for diarrhea, nausea and vomiting.  Genitourinary: Negative for difficulty urinating and dysuria.  Musculoskeletal: Negative for joint swelling and myalgias.  Skin: Negative for color change and rash.  Neurological: Negative for dizziness, light-headedness and headaches.  Psychiatric/Behavioral: Negative for agitation and dysphoric mood.       Objective:    Physical Exam Constitutional:      General: She is not in acute distress.    Appearance: Normal appearance.  HENT:     Nose: Nose normal. No congestion.     Mouth/Throat:     Pharynx: No oropharyngeal exudate or posterior oropharyngeal erythema.  Neck:     Musculoskeletal: Neck supple. No muscular tenderness.     Thyroid: No thyromegaly.  Cardiovascular:     Rate and Rhythm: Normal rate and regular rhythm.  Pulmonary:     Effort: No respiratory distress.     Breath sounds: Normal breath sounds. No wheezing.  Abdominal:     General: Bowel sounds are normal.     Palpations: Abdomen is soft.     Comments: Minimal tenderness to palpation LUQ.    Musculoskeletal:        General: No swelling or tenderness.  Lymphadenopathy:     Cervical: No cervical adenopathy.  Skin:    Findings: No erythema or rash.  Neurological:     Mental Status: She is alert.  Psychiatric:        Mood and Affect: Mood normal.        Behavior: Behavior normal.     BP 130/72 (BP Location: Left Arm, Patient Position: Sitting, Cuff Size: Normal)   Pulse 74   Temp 97.7 F (36.5 C) (Oral)   Resp 16   Wt 193 lb (87.5 kg)   LMP 07/20/1995   SpO2 98%   BMI 34.19 kg/m  Wt Readings from Last 3 Encounters:  08/18/18 193 lb (87.5 kg)  02/03/18 194 lb 6.4 oz (88.2 kg)  11/18/17 190 lb (86.2 kg)     Lab Results  Component Value Date   WBC 5.7 07/23/2017   HGB 14.4 07/23/2017   HCT 43.9 07/23/2017   PLT  292.0 07/23/2017   GLUCOSE 102 (H) 08/04/2018   CHOL 174 08/04/2018   TRIG 94.0 08/04/2018   HDL 57.70 08/04/2018   LDLDIRECT 153.0 07/17/2015  LDLCALC 97 08/04/2018   ALT 11 08/04/2018   AST 12 08/04/2018   NA 138 08/04/2018   K 4.5 08/04/2018   CL 101 08/04/2018   CREATININE 0.76 08/04/2018   BUN 20 08/04/2018   CO2 30 08/04/2018   TSH 2.59 07/23/2017       Assessment & Plan:   Problem List Items Addressed This Visit    Abdominal pain    Persistent intermittent abdominal pain as outlined.  Given persistence, check CT abdomen and pelvis.  Continue PPI.        Relevant Orders   CT Abdomen Pelvis W Contrast   CBC with Differential/Platelet   Basic metabolic panel   GERD (gastroesophageal reflux disease)    Continue current regimen.        GOLD GRADE A COPD with Centrilobular Emphysema    Previously saw pulmonary.  Breathing stable.        Hypercholesterolemia    On crestor.  Low cholesterol diet and exercise.  Follow lipid panel and liver function tests.        Relevant Orders   Hepatic function panel   Lipid panel   TSH   Osteoporosis    Continue prolia.  Seeing Dr Gabriel Carina.       Stress    Handling stress relatively well.  Follow.         Other Visit Diagnoses    Visit for screening mammogram    -  Primary   Relevant Orders   MM 3D SCREEN BREAST BILATERAL       Einar Pheasant, MD

## 2018-08-20 ENCOUNTER — Telehealth: Payer: Self-pay

## 2018-08-20 NOTE — Telephone Encounter (Signed)
Call pt regarding lung screening. Pt is a former smoker. Pt would like to have scan around the lunch hour , no Thursday's. Pt denies any new health issues.

## 2018-08-22 ENCOUNTER — Encounter: Payer: Self-pay | Admitting: Internal Medicine

## 2018-08-22 DIAGNOSIS — R109 Unspecified abdominal pain: Secondary | ICD-10-CM | POA: Insufficient documentation

## 2018-08-22 NOTE — Assessment & Plan Note (Signed)
Previously saw pulmonary.  Breathing stable.  ?

## 2018-08-22 NOTE — Assessment & Plan Note (Signed)
On crestor.  Low cholesterol diet and exercise.  Follow lipid panel and liver function tests.   

## 2018-08-22 NOTE — Assessment & Plan Note (Signed)
Handling stress relatively well.  Follow.

## 2018-08-22 NOTE — Assessment & Plan Note (Signed)
Continue current regimen

## 2018-08-22 NOTE — Assessment & Plan Note (Signed)
Continue prolia.  Seeing Dr Gabriel Carina.

## 2018-08-22 NOTE — Assessment & Plan Note (Signed)
Persistent intermittent abdominal pain as outlined.  Given persistence, check CT abdomen and pelvis.  Continue PPI.

## 2018-08-24 ENCOUNTER — Telehealth: Payer: Self-pay | Admitting: *Deleted

## 2018-08-24 DIAGNOSIS — Z87891 Personal history of nicotine dependence: Secondary | ICD-10-CM

## 2018-08-24 DIAGNOSIS — Z122 Encounter for screening for malignant neoplasm of respiratory organs: Secondary | ICD-10-CM

## 2018-08-24 NOTE — Telephone Encounter (Signed)
Patient has been notified that annual lung cancer screening low dose CT scan is due currently or will be in near future. Confirmed that patient is within the age range of 55-77, and asymptomatic, (no signs or symptoms of lung cancer). Patient denies illness that would prevent curative treatment for lung cancer if found. Verified smoking history, (former, quit 2006, 30 pack year). The shared decision making visit was done 06/14/14. Patient is agreeable for CT scan being scheduled.

## 2018-09-01 ENCOUNTER — Ambulatory Visit: Payer: Medicare HMO

## 2018-09-03 ENCOUNTER — Ambulatory Visit
Admission: RE | Admit: 2018-09-03 | Discharge: 2018-09-03 | Disposition: A | Discharge status: Home / Self Care | Payer: Medicare HMO | Source: Ambulatory Visit | Attending: Internal Medicine | Admitting: Internal Medicine

## 2018-09-03 DIAGNOSIS — Z87891 Personal history of nicotine dependence: Secondary | ICD-10-CM | POA: Insufficient documentation

## 2018-09-03 DIAGNOSIS — Z122 Encounter for screening for malignant neoplasm of respiratory organs: Secondary | ICD-10-CM

## 2018-09-03 DIAGNOSIS — R1084 Generalized abdominal pain: Secondary | ICD-10-CM | POA: Insufficient documentation

## 2018-09-03 DIAGNOSIS — K760 Fatty (change of) liver, not elsewhere classified: Secondary | ICD-10-CM | POA: Diagnosis not present

## 2018-09-03 IMAGING — CT CT CHEST LUNG CANCER SCREENING LOW DOSE W/O CM
1 of 3 series · 15 of 40 positions shown, 19 images · non-contrast
Comparison: [DATE]

CLINICAL DATA: 72-year-old female with 30 pack-year history of
smoking. Lung cancer screening.

EXAM:
CT CHEST WITHOUT CONTRAST LOW-DOSE FOR LUNG CANCER SCREENING
TECHNIQUE: Multidetector CT imaging of the chest was performed following the
standard protocol without IV contrast.

[Series 3: lung · axial · 0.59mm/px · z∈[-1238,-942]mm · 15 of 326 slices shown, 19 images]
[im 15/326  mediastinal]
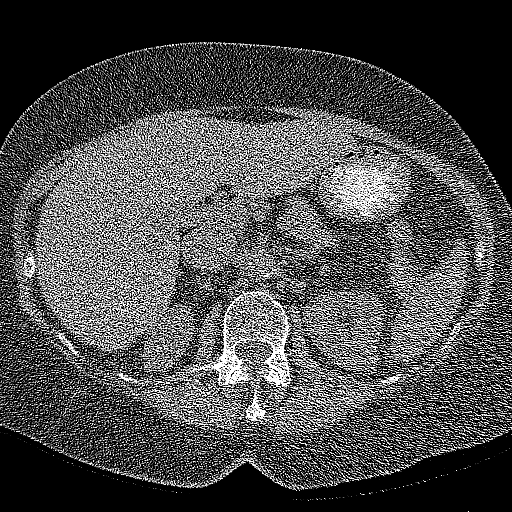
[im 15/326  lung]
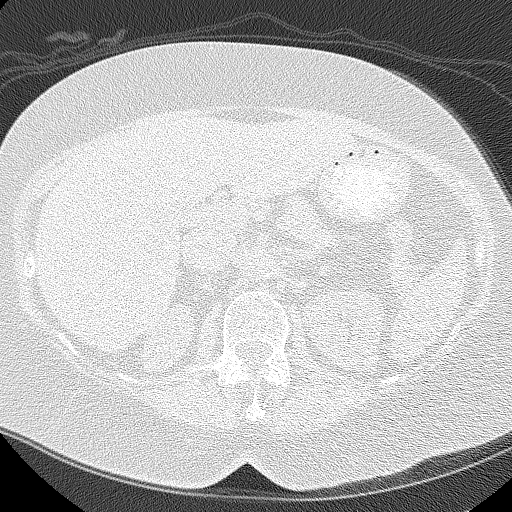
[im 45/326  lung]
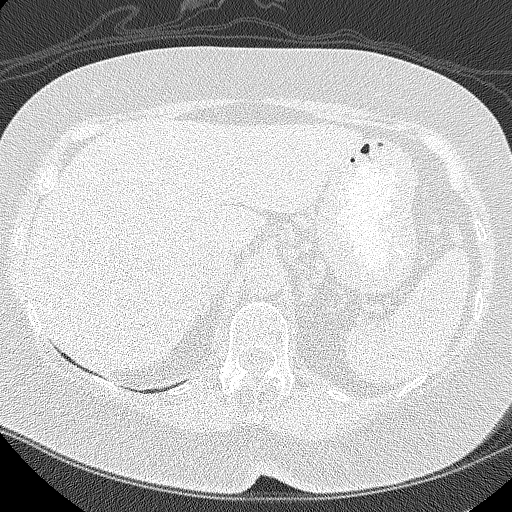
[im 74/326  lung]
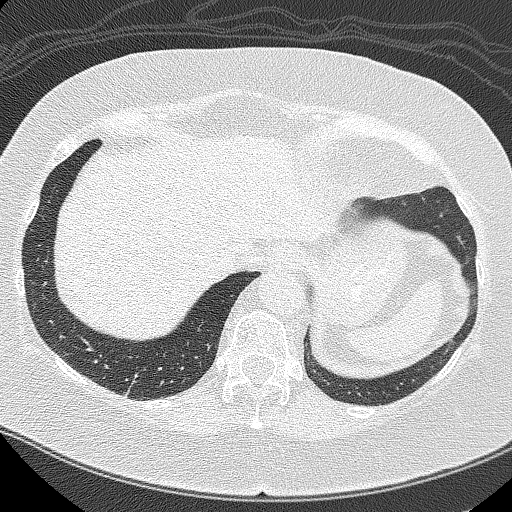
[im 89/326  lung]
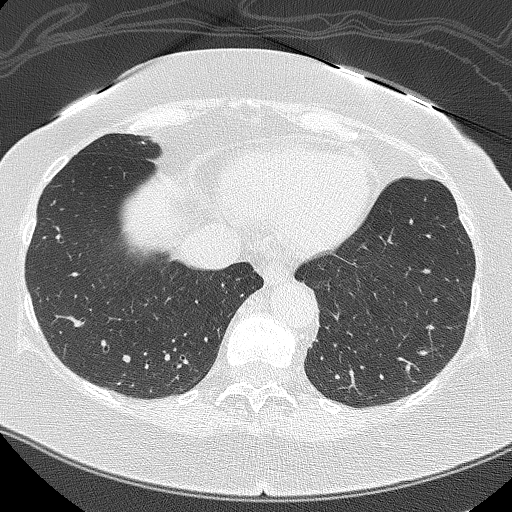
[im 109/326  mediastinal]
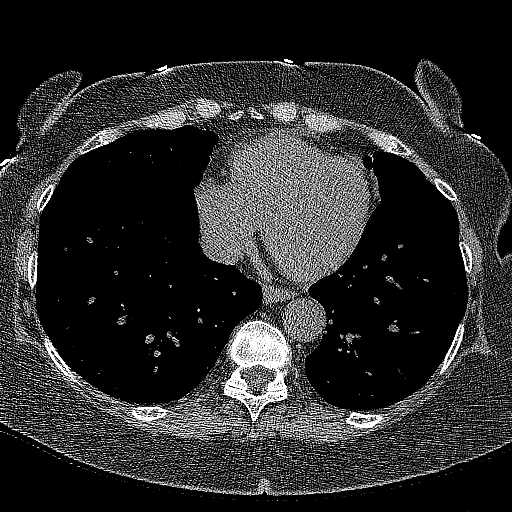
[im 109/326  lung]
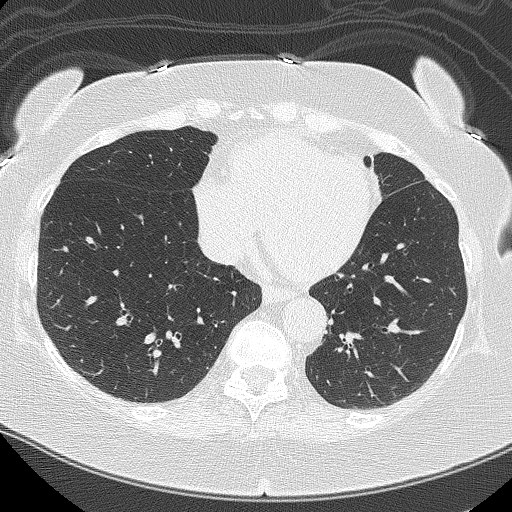
[im 119/326  lung]
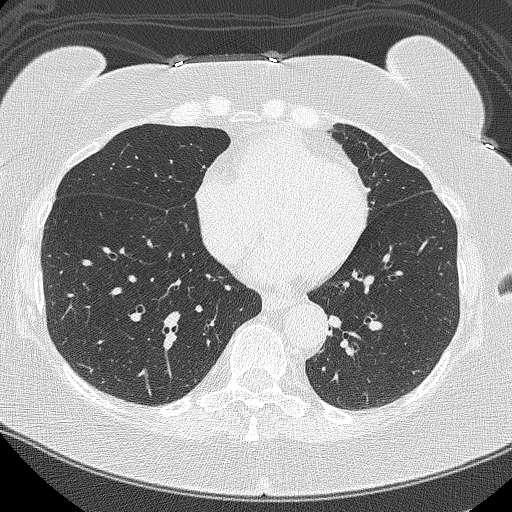
[im 148/326  lung]
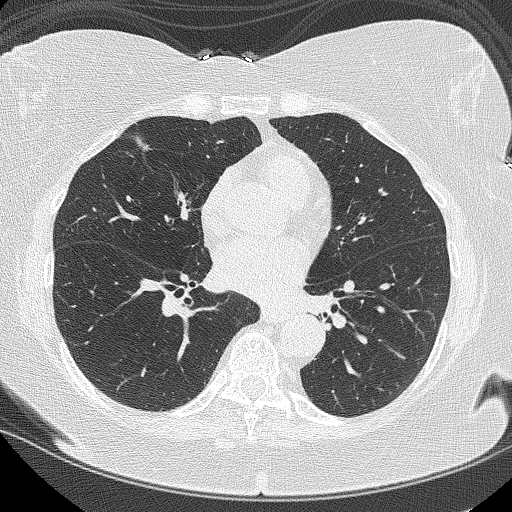
[im 163/326  lung]
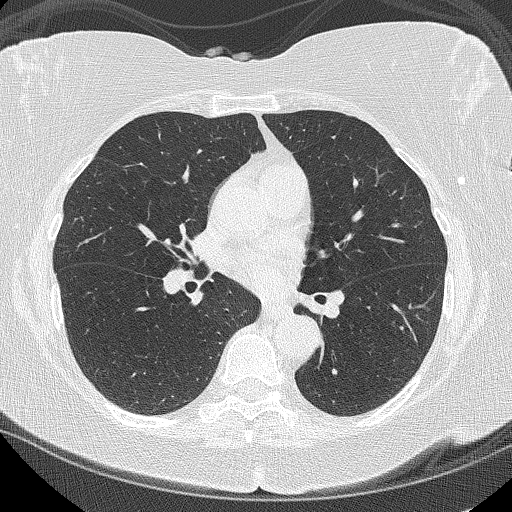
[im 178/326  mediastinal]
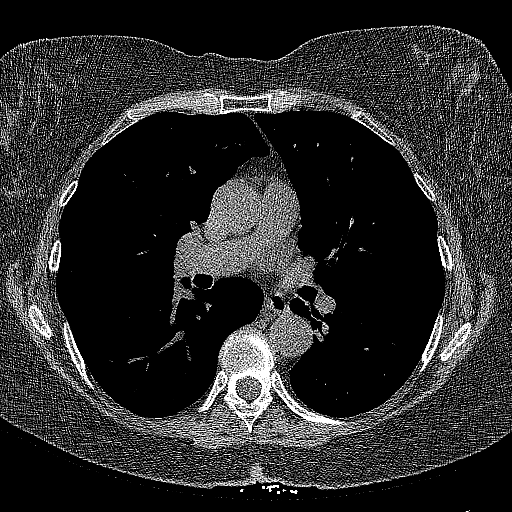
[im 178/326  lung]
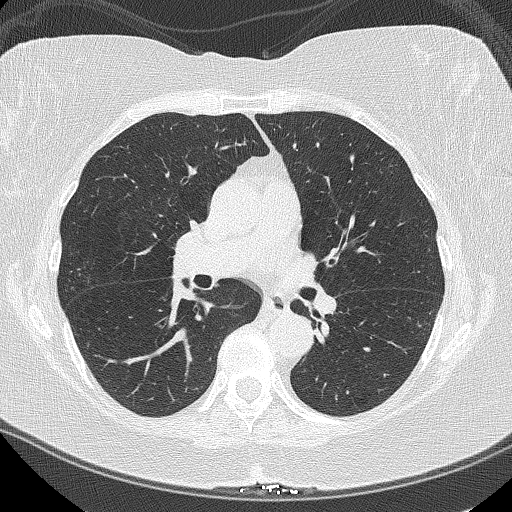
[im 207/326  lung]
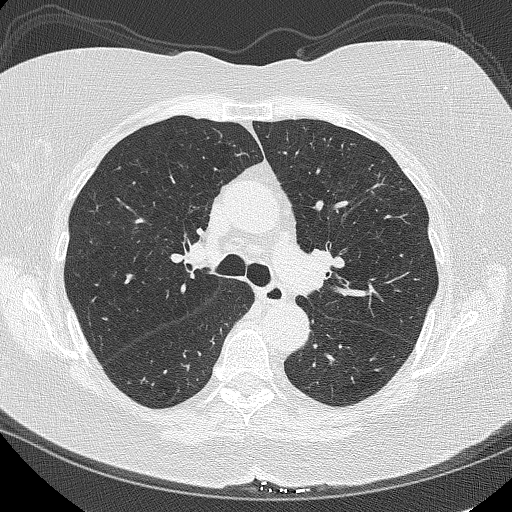
[im 217/326  lung]
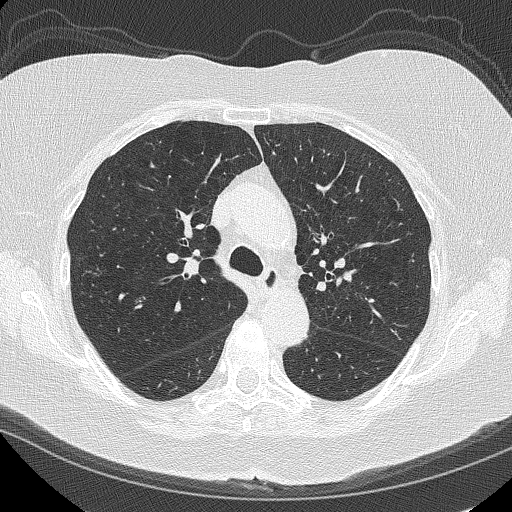
[im 237/326  lung]
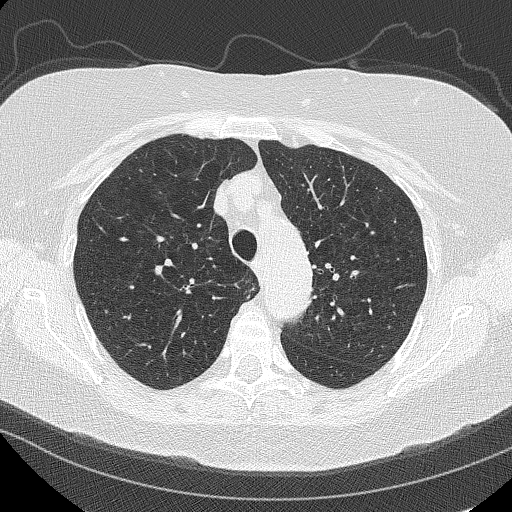
[im 266/326  mediastinal]
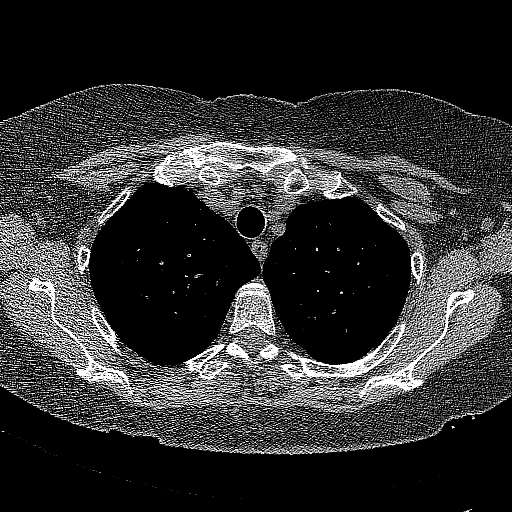
[im 266/326  lung]
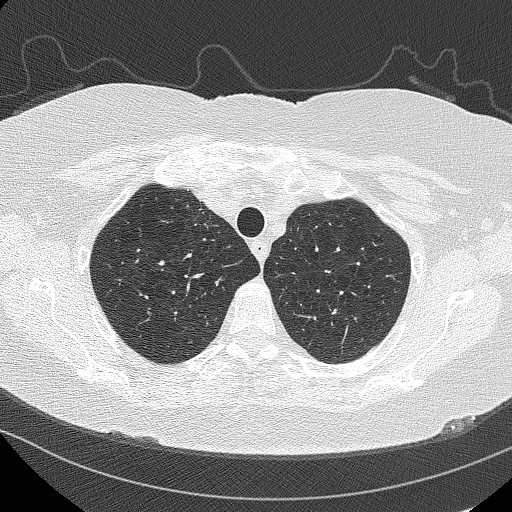
[im 281/326  lung]
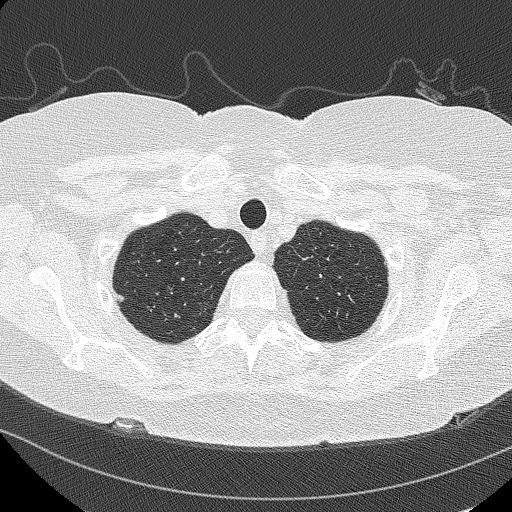
[im 311/326  lung]
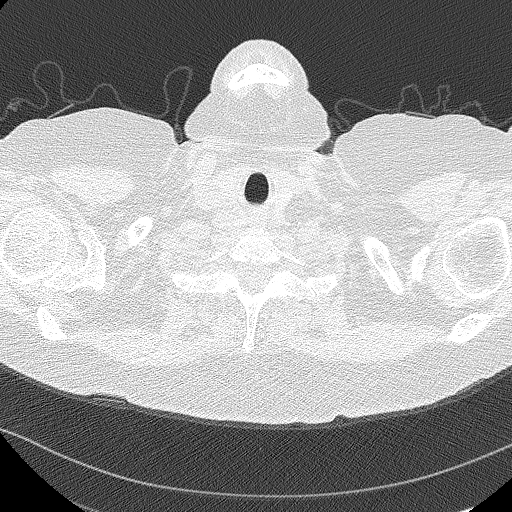

[15 of 40 positions shown; findings below may reference images not displayed]

FINDINGS: Cardiovascular: The heart size is normal. No substantial pericardial
effusion. Atherosclerotic calcification is noted in the wall of the
thoracic aorta.

Mediastinum/Nodes: No mediastinal lymphadenopathy. No evidence for
gross hilar lymphadenopathy although assessment is limited by the
lack of intravenous contrast on today's study. The esophagus has
normal imaging features. There is no axillary lymphadenopathy.

Lungs/Pleura: The central tracheobronchial airways are patent.
Biapical pleuroparenchymal scarring noted. Centrilobular and
paraseptal emphysema evident. Scattered tiny pulmonary nodules seen
previously are stable in the interval. New bilateral tiny pulmonary
nodules are identified on today's study, measuring up to 3.5 mm. New
clusters of tree-in-bud opacity are identified in the medial right
upper lobe (image 77) and posterior right upper lobe (image 140)
compatible with atypical infection.

Upper Abdomen: Unremarkable.

Musculoskeletal: No worrisome lytic or sclerotic osseous
abnormality.
IMPRESSION: 1. Lung-RADS 2, benign appearance or behavior. Continue annual
screening with low-dose chest CT without contrast in 12 months.
2. New, small clusters of tree-in-bud nodularity in 2 areas of the
right upper lobe, compatible with atypical infection.
3.  Emphysema. ([3C]-[3C])
4.  Aortic Atherosclerois ([3C]-170.0)

1.

## 2018-09-03 IMAGING — CT CT ABD-PELV W/ CM
2 of 5 series · 16 of 46 positions shown, 18 images · IV contrast (iopamidol)
Comparison: None

CLINICAL DATA: LEFT upper quadrant abdominal pain for 4 months
question gastroenteritis or colitis

EXAM:
CT ABDOMEN AND PELVIS WITH CONTRAST
TECHNIQUE: Multidetector CT imaging of the abdomen and pelvis was performed
using the standard protocol following bolus administration of
intravenous contrast. Sagittal and coronal MPR images reconstructed
from axial data set.
CONTRAST:  100mL [LC] IOPAMIDOL ([LC]) INJECTION 61% IV.
Dilute oral contrast.

[Series 2: abd pelvis · axial · 0.75mm/px · z∈[-1552,-1162]mm · 13 of 88 slices shown, 15 images (1 of 2)]
[im 5/88  soft-tissue]
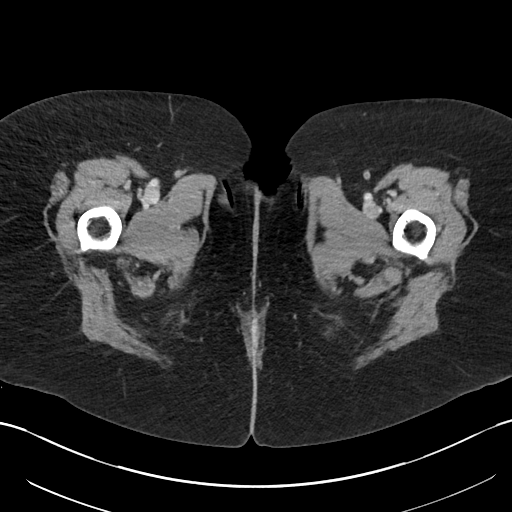
[im 5/88  bone]
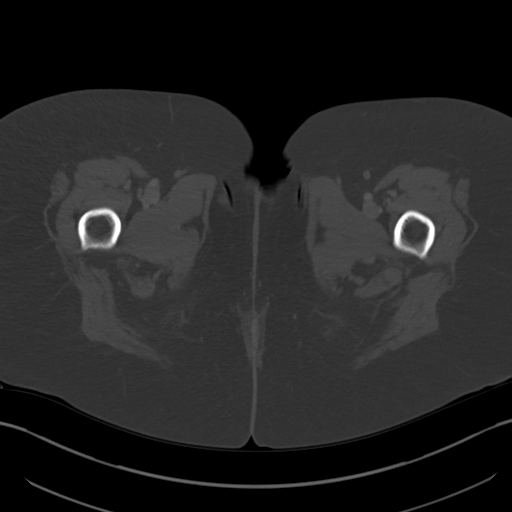
[im 14/88  soft-tissue]
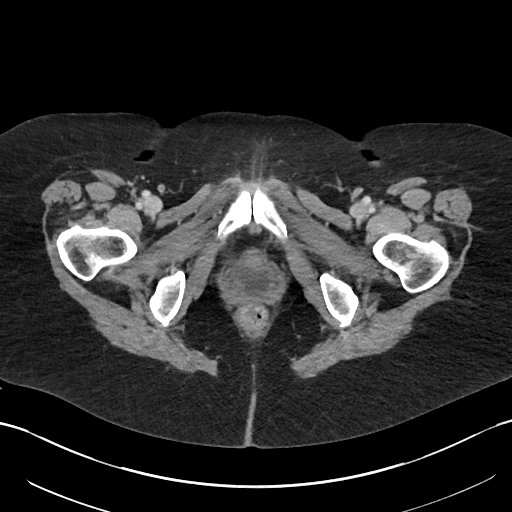
[im 19/88  soft-tissue]
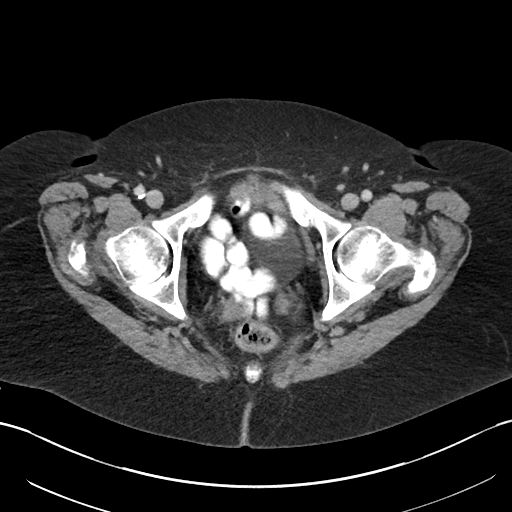
[im 23/88  soft-tissue]
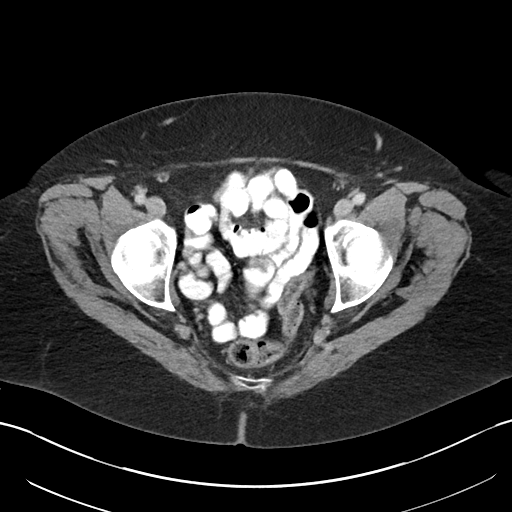
[im 33/88  soft-tissue]
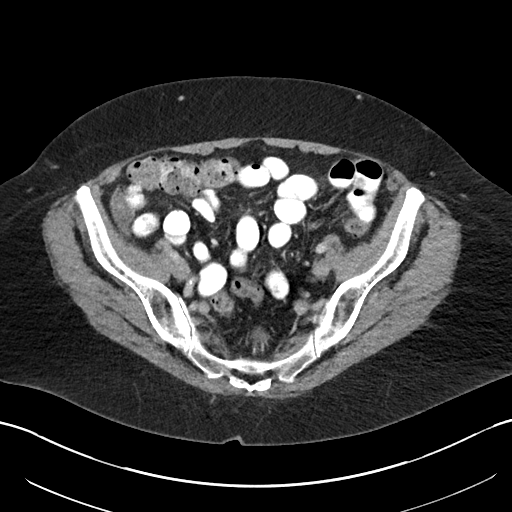
[im 37/88  soft-tissue]
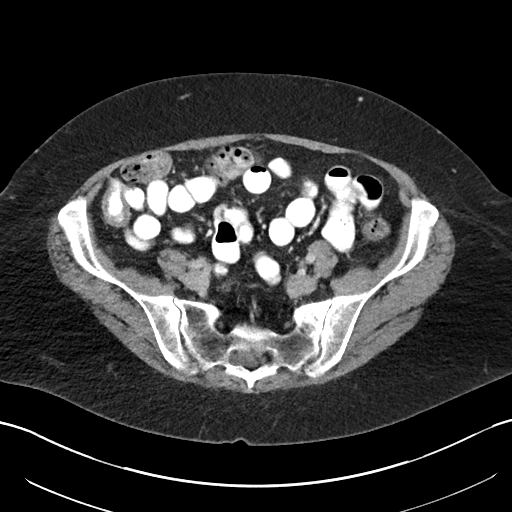
[im 46/88  soft-tissue]
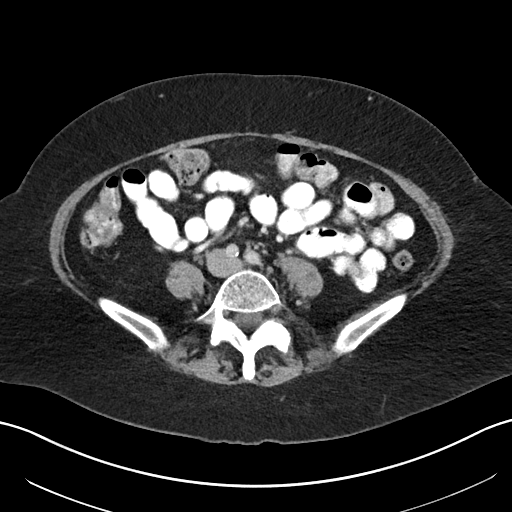
[im 51/88  soft-tissue]
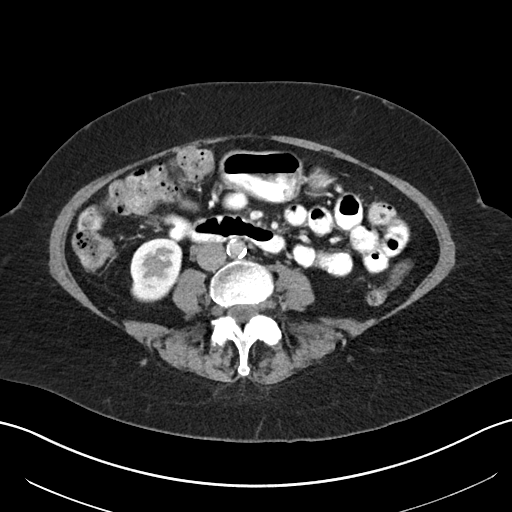
[im 55/88  soft-tissue]
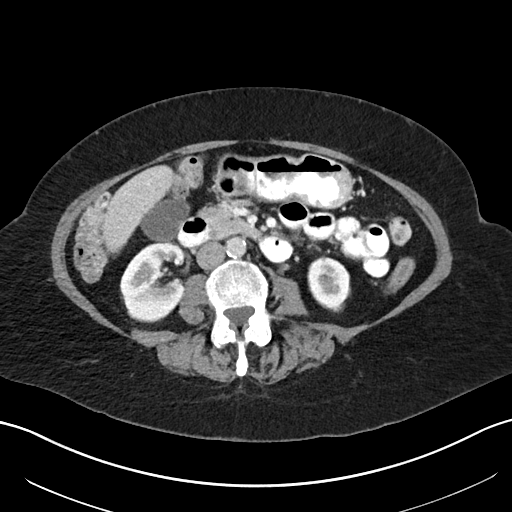
[im 55/88  bone]
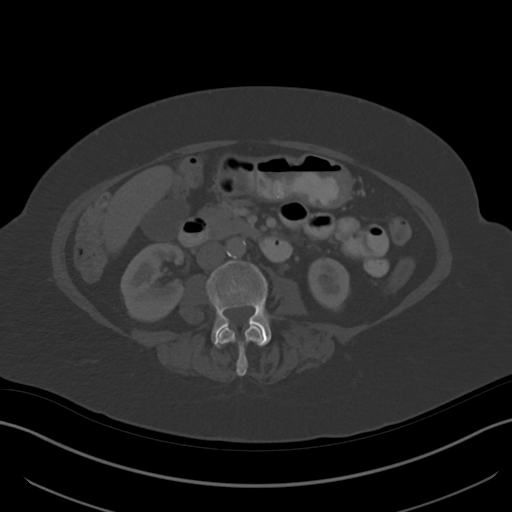
[im 65/88  soft-tissue]
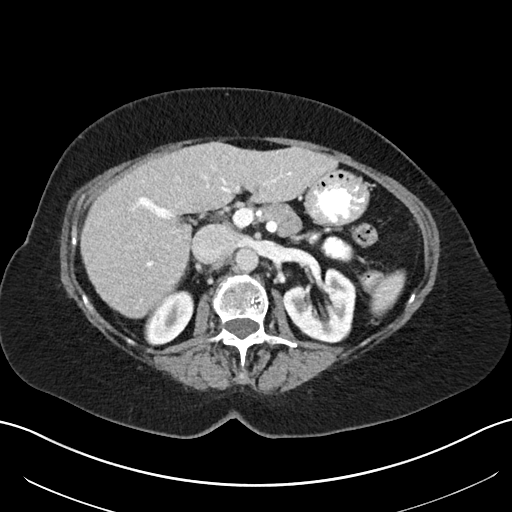
[im 69/88  soft-tissue]
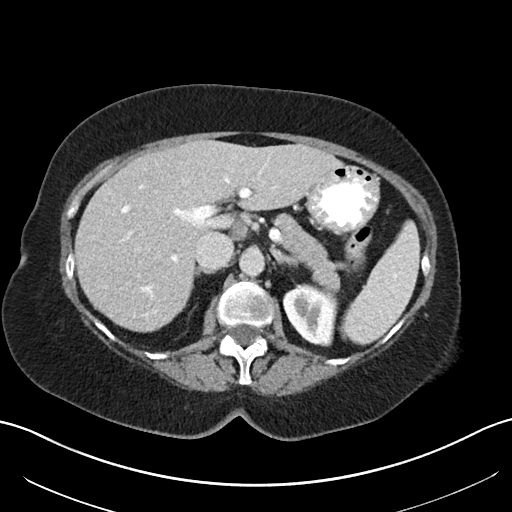
[im 74/88  soft-tissue]
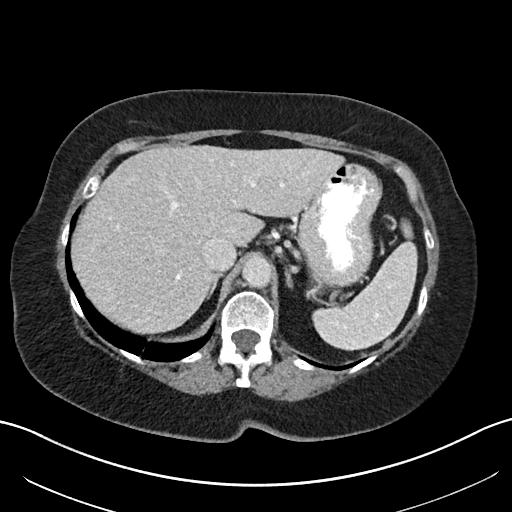
[im 83/88  soft-tissue]
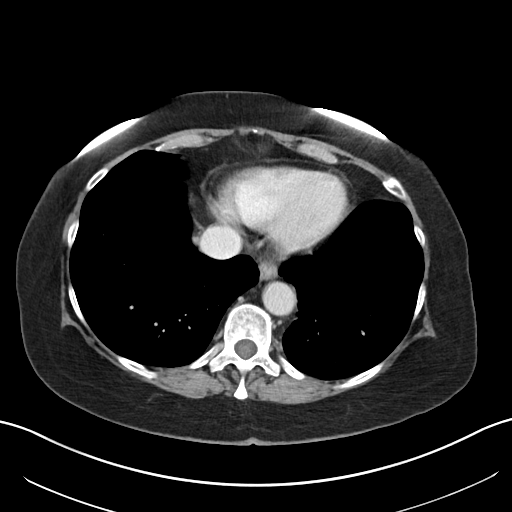

[Series 4: abd pelvis · coronal · 0.75mm/px · 3 of 148 slices shown (2 of 2)]
[im 50/148  soft-tissue]
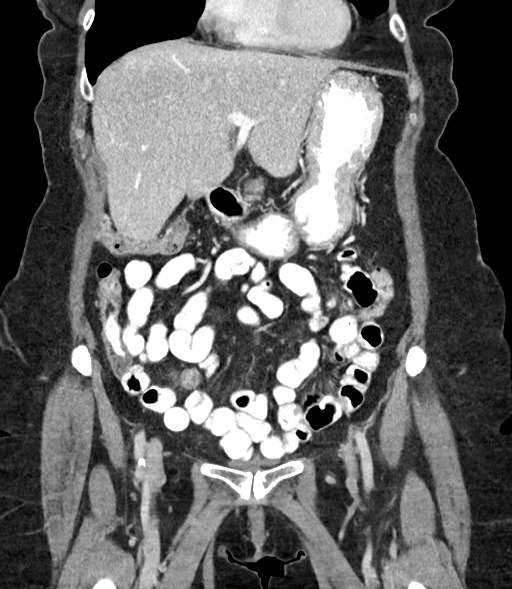
[im 66/148  soft-tissue]
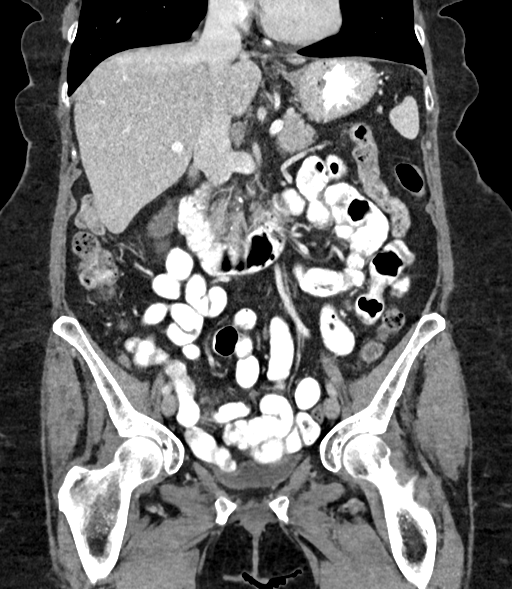
[im 82/148  soft-tissue]
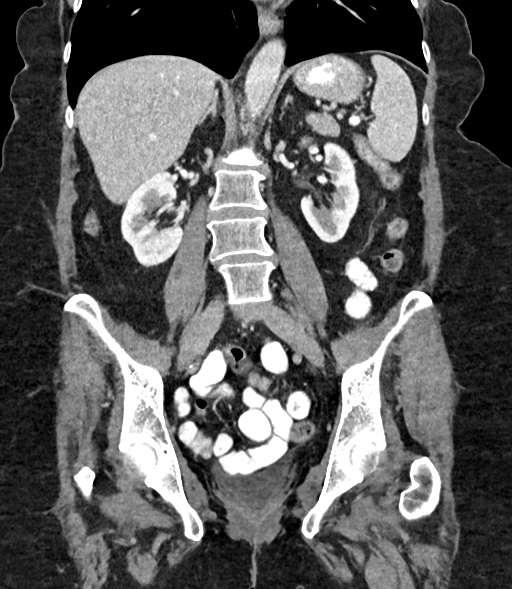

[16 of 46 positions shown; findings below may reference images not displayed]

FINDINGS: Lower chest: Lung bases clear

Hepatobiliary: Minimal focal fatty infiltration of liver adjacent to
falciform fissure. Gallbladder and liver otherwise normal appearance

Pancreas: Normal appearance

Spleen: Normal appearance

Adrenals/Urinary Tract: Adrenal glands normal appearance. Tiny RIGHT
renal cyst. Area of ill-defined nephrographic impairment at the mid
LEFT kidney question pyelonephritis. No definite mass lesion seen at
this site. No urinary tract calcification or dilatation. Low descent
of urinary bladder base consistent with a small cystocele.

Stomach/Bowel: Stomach and small bowel loops normal appearance.:
Unremarkable. Fluid attenuation structure identified adjacent to the
tip of the cecum question mucocele of the appendix 3.2 x 2.0 x
cm.

Vascular/Lymphatic: Atherosclerotic calcifications of aorta and
iliac arteries. Aorta normal caliber. No adenopathy.

Reproductive: Uterus surgically absent. Nonvisualization of ovaries.

Other: No free air or free fluid.  No hernia.

Musculoskeletal: Osseous structures unremarkable.
IMPRESSION: Question mucocele of the appendix 3.2 x 2.8 x 2.0 cm in size.

Focal area of patchy nephrographic abnormality at the lateral mid
LEFT kidney most consistent with pyelonephritis; correlation with
urinalysis recommended.

Small cystocele.

No additional intra-abdominal or intrapelvic abnormalities.

## 2018-09-03 MED ORDER — IOPAMIDOL (ISOVUE-300) INJECTION 61%
100.0000 mL | Freq: Once | INTRAVENOUS | Status: AC | PRN
  Administered 2018-09-03: 100 mL via INTRAVENOUS

## 2018-09-06 ENCOUNTER — Inpatient Hospital Stay: Admission: RE | Admit: 2018-09-06 | Payer: Medicare HMO | Source: Ambulatory Visit

## 2018-09-06 ENCOUNTER — Encounter: Payer: Self-pay | Admitting: *Deleted

## 2018-09-06 DIAGNOSIS — M810 Age-related osteoporosis without current pathological fracture: Secondary | ICD-10-CM | POA: Diagnosis not present

## 2018-09-07 ENCOUNTER — Other Ambulatory Visit: Payer: Self-pay | Admitting: Internal Medicine

## 2018-09-07 ENCOUNTER — Encounter: Payer: Self-pay | Admitting: *Deleted

## 2018-09-07 DIAGNOSIS — K388 Other specified diseases of appendix: Secondary | ICD-10-CM

## 2018-09-07 DIAGNOSIS — R935 Abnormal findings on diagnostic imaging of other abdominal regions, including retroperitoneum: Secondary | ICD-10-CM

## 2018-09-07 DIAGNOSIS — R9389 Abnormal findings on diagnostic imaging of other specified body structures: Secondary | ICD-10-CM

## 2018-09-07 NOTE — Progress Notes (Signed)
Order placed for pulmonary referral and surgery referral.  Urinalysis also ordered.

## 2018-09-09 ENCOUNTER — Other Ambulatory Visit (INDEPENDENT_AMBULATORY_CARE_PROVIDER_SITE_OTHER): Payer: Medicare HMO

## 2018-09-09 DIAGNOSIS — R935 Abnormal findings on diagnostic imaging of other abdominal regions, including retroperitoneum: Secondary | ICD-10-CM

## 2018-09-09 DIAGNOSIS — R109 Unspecified abdominal pain: Secondary | ICD-10-CM | POA: Diagnosis not present

## 2018-09-09 LAB — URINALYSIS, ROUTINE W REFLEX MICROSCOPIC
Bilirubin Urine: NEGATIVE
Hgb urine dipstick: NEGATIVE
Ketones, ur: NEGATIVE
Leukocytes,Ua: NEGATIVE
Nitrite: NEGATIVE
Specific Gravity, Urine: 1.025 (ref 1.000–1.030)
Total Protein, Urine: NEGATIVE
Urine Glucose: NEGATIVE
Urobilinogen, UA: 0.2 (ref 0.0–1.0)
pH: 5.5 (ref 5.0–8.0)

## 2018-09-09 LAB — BASIC METABOLIC PANEL
BUN: 11 mg/dL (ref 6–23)
CO2: 29 mEq/L (ref 19–32)
Calcium: 9.9 mg/dL (ref 8.4–10.5)
Chloride: 101 mEq/L (ref 96–112)
Creatinine, Ser: 0.7 mg/dL (ref 0.40–1.20)
GFR: 82.6 mL/min (ref >60.00)
Glucose, Bld: 111 mg/dL — ABNORMAL HIGH (ref 70–99)
Potassium: 3.8 mEq/L (ref 3.5–5.1)
Sodium: 140 mEq/L (ref 135–145)

## 2018-09-10 ENCOUNTER — Encounter: Payer: Self-pay | Admitting: *Deleted

## 2018-09-11 LAB — URINE CULTURE
MICRO NUMBER:: 251169
SPECIMEN QUALITY:: ADEQUATE

## 2018-09-27 ENCOUNTER — Other Ambulatory Visit: Payer: Self-pay | Admitting: Internal Medicine

## 2018-09-28 ENCOUNTER — Encounter: Payer: Self-pay | Admitting: Internal Medicine

## 2018-09-28 MED ORDER — DOXYCYCLINE HYCLATE 100 MG PO TABS: 100.0000 mg | ORAL_TABLET | Freq: Two times a day (BID) | ORAL | 0 refills | Status: DC

## 2018-09-28 NOTE — Telephone Encounter (Signed)
Spoke to pt.  She has been taking otc robitussin DM, etc.  Now with increased sinus pressure and congestion and now with increased cough.  Productive green mucus.  No fever.  She desires not to come in.  rx for doxycycline sent in to [facility].  Instructed to take a probiotic daily while on abx and for two weeks after completing abx.  Will keep [place] posted.  Will be evaluated if symptoms worsen or do not resolve.

## 2018-09-28 NOTE — Telephone Encounter (Signed)
Spoke with patient. She has not traveled nor has she been around who has traveled or been sick. She declines fever, chills, body aches. Her symptoms started 3/9 and started with sinus pressure, drainage, cough. Now feels like it is just lingering on and has moved into her chest. She has a productive cough and congestion. She says she feels much better than she did but has not cleared up. She has not had any SOB. Wondering if you would be willing to call her in an abx? Advised that I would send message over and see if you were agreeable to send in abx

## 2018-10-07 ENCOUNTER — Telehealth: Payer: Self-pay

## 2018-10-07 NOTE — Telephone Encounter (Signed)
Called patient for COVID-19 screening.  Have you recently traveled any where out of the local area in the last 2 weeks?  no  Have you been in close contact with a person diagnosed with COVID-19 within the last 2 weeks? no  Do you currently have any fever, cough, or shortness of breath? Leftover cough from cold, denies fever  Okay to proceed with visit.

## 2018-10-11 ENCOUNTER — Telehealth: Payer: Self-pay

## 2018-10-11 NOTE — Telephone Encounter (Signed)
I spoke to patient regarding office visit for 10/14/18, she is okay with proceeding with televisit.

## 2018-10-13 ENCOUNTER — Ambulatory Visit (INDEPENDENT_AMBULATORY_CARE_PROVIDER_SITE_OTHER): Payer: Medicare HMO | Admitting: Internal Medicine

## 2018-10-13 ENCOUNTER — Encounter: Payer: Self-pay | Admitting: Internal Medicine

## 2018-10-13 DIAGNOSIS — J449 Chronic obstructive pulmonary disease, unspecified: Secondary | ICD-10-CM | POA: Diagnosis not present

## 2018-10-13 DIAGNOSIS — R9389 Abnormal findings on diagnostic imaging of other specified body structures: Secondary | ICD-10-CM

## 2018-10-13 DIAGNOSIS — J479 Bronchiectasis, uncomplicated: Secondary | ICD-10-CM

## 2018-10-13 DIAGNOSIS — R918 Other nonspecific abnormal finding of lung field: Secondary | ICD-10-CM | POA: Diagnosis not present

## 2018-10-13 NOTE — Patient Instructions (Signed)
Follow up CT chest in 6 months  May use albuterol as needed  Recommend Weight loss approx 10-15 pounds in next 6-9 months

## 2018-10-13 NOTE — Progress Notes (Signed)
VIDEO/TELEPHONE VISIT    In the setting of the current Covid19 crisis, you are scheduled for a (video) visit with me 10/13/2018  Just as we do with many in-office visits, in order for you to participate in this visit, we must obtain consent.   I can obtain your verbal consent now.  PATIENT AGREES AND CONFIRMS -YES This Visit has Audio and Visual Capabilities for optimal patient care experience   Evaluation Performed:  CONSULT  This visit type was conducted due to national recommendations for restrictions regarding the COVID-19 Pandemic (e.g. social distancing).  This format is felt to be most appropriate for this patient at this time.  All issues noted in this document were discussed and addressed.  No physical exam was performed (except for noted visual exam findings with Telehealth visits).  See MyChart message from today for the patient's consent to telehealth for [place]     Date:  10/13/2018   ID:  Alexis Reed, DOB 01-31-1946, MRN [id number]  Patient Location:  [address] [place] [address]   Provider location:   [place]  PCP:  Einar Pheasant, MD   Chief Complaint:   ABNORMAL CT CHEST   History of Present Illness:    Alexis Reed is a 73 y.o. female who presents via audio/video conferencing for a telehealth visit today.   The patient does not symptoms concerning for COVID-19 infection (fever, chills, cough, or new SHORTNESS OF BREATH).   She has been with Lung cancer screening referral program for 5 years Patient with most recent CT chest shows tree in bud pattern nodular opacity in RUL Patient did NOT have any signs of infection or resp distress during the time of CT chest She has had 5 CT scans of chest as part of lung cancer screening program Radiology reports tree in bud pattern which prompted further assessment  She is pleasant  She quit tobacco abuse 15 years ago Smoked 1 ppd for 30 years  She has had  COPD exacerbation 3 weeks ago and was given oral abx no steroids She has no acute symptoms at this time  She has CHRONIC SOB AND DOE for many years She has hard time with one flight of stairs She weighs 185 pounds  She has intermittent wheezing and intermittent cough She uses albuterol as needed but has not used in several months        Prior RADIOLOGY STUDIES  The following studies were reviewed today: RUL nodular opacity c/w infection/inflammation     I have shown the patient her CT scans via VIDEO and she was satisfied with assessment    Past Medical History:  Diagnosis Date  . Arthritis   . Benign breast lumps    multiple lumps biopsy and remove x's 5 last being 1979 by Dr. Sharlet Salina  . Fibroids    s/p hysterectomy  . Hypercholesterolemia   . Osteoporosis   . Personal history of tobacco use, presenting hazards to health 08/07/2015   Past Surgical History:  Procedure Laterality Date  . ABDOMINAL HYSTERECTOMY  1997   with bilateral oophorectomy  . BREAST BIOPSY Right 1975   multiple biopsies done  . BREAST BIOPSY Left    multiple done  . BREAST BIOPSY Right 2016   stereo  . BREAST SURGERY Left    lump removed. Unsure of date  . BREAST SURGERY Right    lump removed. Unsure of date  . COLONOSCOPY  2010  Dr. Vira Agar  . TONSILLECTOMY       No outpatient medications have been marked as taking for the 10/13/18 encounter (Appointment) with Flora Lipps, MD.     Allergies:   Alendronate; Boniva [ibandronic acid]; Codeine; Fosamax [alendronate sodium]; Lipitor [atorvastatin]; Other; and Penicillins   Social History   Tobacco Use  . Smoking status: Former Smoker    Packs/day: 1.00    Years: 30.00    Pack years: 30.00    Types: Cigarettes    Last attempt to quit: 04/26/2004    Years since quitting: 14.4  . Smokeless tobacco: Never Used  Substance Use Topics  . Alcohol use: Yes    Alcohol/week: 0.0 standard drinks    Comment: occassional  . Drug use: No      Family Hx: The patient's family history includes Colon cancer in her father; Heart disease in her father; Stroke in her father. There is no history of Breast cancer.   Review of Systems:  Gen:  Denies  fever, sweats, chills weigh loss  HEENT: Denies blurred vision, double vision, ear pain, eye pain, hearing loss, nose bleeds, sore throat Cardiac:  No dizziness, chest pain or heaviness, chest tightness,edema, No JVD Resp:   + cough, -sputum production, +shortness of breath,+wheezing, -hemoptysis,  Gi: Denies swallowing difficulty, stomach pain, nausea or vomiting, diarrhea, constipation, bowel incontinence Gu:  Denies bladder incontinence, burning urine Ext:   Denies Joint pain, stiffness or swelling Skin: Denies  skin rash, easy bruising or bleeding or hives Endoc:  Denies polyuria, polydipsia , polyphagia or weight change Psych:   Denies depression, insomnia or hallucinations  Other:  All other systems negative   Vital Signs:  LMP 07/20/1995    Physical Examination:  Well nourished, well developed female in no acute distress. No Obvious Respiratory Distress noted EOMI intact, NO obvious oral lesions Facial Skin intact-no facial rash noted CN 3-12 intact   Insight, judgment intact. -depression -anxiety  Outpatient Encounter Medications as of 10/13/2018  Medication Sig  . Calcium Carbonate-Vitamin D (CALCIUM 600-D) 600-400 MG-UNIT per tablet Take 1 tablet by mouth 2 (two) times daily.  . Cholecalciferol (VITAMIN D) 2000 UNITS tablet Take 2,000 Units by mouth daily.  . Denosumab (PROLIA ) Inject into the skin. Injection twice a year  . doxycycline (VIBRA-TABS) 100 MG tablet Take 1 tablet (100 mg total) by mouth 2 (two) times daily.  . fluconazole (DIFLUCAN) 150 MG tablet Take one tablet x 1.  If persistent symptoms, may repeat x 1 in 3 days.  Marland Kitchen omeprazole (PRILOSEC) 20 MG capsule TAKE 1 CAPSULE BY MOUTH ONCE DAILY  . rosuvastatin (CRESTOR) 5 MG tablet TAKE 1 TABLET BY MOUTH  ONCE DAILY   No facility-administered encounter medications on file as of 10/13/2018.    Medications reviewed with patient  Labs/Other Tests and Data Reviewed:    Recent Labs: 08/04/2018: ALT 11 09/09/2018: BUN 11; Creatinine, Ser 0.70; Potassium 3.8; Sodium 140     ASSESSMENT & PLAN:    72 yo pleasant white female with long standing previous smoking history with dx of COPD and based on CT scan findings also with Bronchiectasis with ABNORMAL CT CHEST FINDINGS of Tree in bud nodular opacity that's c/w inflammatory and/or previous lung infection.  COPD-seems to be stable and mild at this time Advised to use albuterol as needed Patient does not need inhaled steroids at this time No need for steroids/abx at this time No evidence of COPD exacerbation  Bronchiectasis Asymptomatic at this point in time  Education provided to patient for looking for symptoms   Lung Cancer Screening with ABNORMAL CT CHEST with nodular opacity No concerning evidence of malignancy at this time Recommend follow up CT chest in 6 months  Deconditioned state -Recommend increased daily activity and exercise Recommend Weight Loss    COVID-19 Education: The signs and symptoms of COVID-19 were discussed with the patient and how to seek care for testing (follow up with PCP or arrange E-visit).  The importance of social distancing was discussed today.    Time:   Today, I have spent 47  minutes with the patient with telehealth technology discussing .     Medication Adjustments/Labs and Tests Ordered: Current medicines are reviewed at length with the patient today.  Concerns regarding medicines are outlined above.   Tests Ordered: CT chest in 6 months   Disposition: Follow-up in 6 months   Patient satisfied with Plan of action and management. All questions answered    Corrin Parker, M.D.  [facility]  Medical Director [facility]  Cardio-Pulmonary Department

## 2018-11-24 DIAGNOSIS — K388 Other specified diseases of appendix: Secondary | ICD-10-CM | POA: Diagnosis not present

## 2018-12-06 DIAGNOSIS — N390 Urinary tract infection, site not specified: Secondary | ICD-10-CM | POA: Diagnosis not present

## 2018-12-13 ENCOUNTER — Ambulatory Visit: Payer: Self-pay | Admitting: Surgery

## 2018-12-13 NOTE — H&P (View-Only) (Signed)
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 female who was referred by Alisa Graff, MD for evaluation of above. First noted incidentally on CT scan for LUQ pain workup almost three months ago.    Still occasionally complains of intermittent discomfort in left upper quadrant area but is not requiring any specific intervention.  Never had pain in the right lower quadrant.  After being notified of the CT scan results when initially reported, believe that since she is minimally asymptomatic there was no need for further follow-up until now.  Today she comes in with her daughter.  Review of systems is otherwise unremarkable as noted below.    Past Medical History:  has a past medical history of COPD (chronic obstructive pulmonary disease) (CMS-HCC), Hypercholesterolemia, and Osteoarthritis.  Past Surgical History:  has a past surgical history that includes Hysterectomy (07/15/1995); Tonsillectomy; Colonoscopy (09/22/2008, 09/19/2003); egd (09/22/2008); Colonoscopy (12/05/2014); biopsy of breast (Right, 07/14/2014); and lump removal from breast (Right, 07/15/1975).  Family History: family history includes Colon cancer in her father; Myocardial Infarction (Heart attack) in her father; No Known Problems in her brother; Stroke in her father; Thyroid disease in her mother.  Social History:  reports that she has quit smoking. She has never used smokeless tobacco. She reports current alcohol use. She reports that she does not use drugs.  Current Medications: has a current medication list which includes the following prescription(s): calcium carbonate-vitamin d3, cholecalciferol, ipratropium-albuterol, omeprazole, and rosuvastatin, and the following Facility-Administered Medications: denosumab.  Allergies:       Allergies  Allergen Reactions  . Atorvastatin Hives  . Boniva [Ibandronate] Other (See Comments)    Aching  . Codeine Sulfate Swelling     Facial swelling  . Fosamax [Alendronate] Other (See Comments)    Caused aching.  Marland Kitchen Penicillin V Potassium Unknown    ROS:  A 15 point review of systems was performed and pertinent positives and negatives noted in HPI   Objective:   BP 148/88   Pulse 83   Ht 162.6 cm (5\' 4" )   Wt 85.3 kg (188 lb)   BMI 32.27 kg/m   Constitutional :  alert, appears stated age, cooperative and no distress  Lymphatics/Throat:  no asymmetry, masses, or scars  Respiratory:  clear to auscultation bilaterally  Cardiovascular:  regular rate and rhythm  Gastrointestinal: soft, non-tender; bowel sounds normal; no masses,  no organomegaly.    Musculoskeletal: Steady gait and movement  Skin: Cool and moist  Psychiatric: Normal affect, non-agitated, not confused       LABS:  n/a  RADS: CLINICAL DATA: LEFT upper quadrant abdominal pain for 4 months question gastroenteritis or colitis  EXAM: CT ABDOMEN AND PELVIS WITH CONTRAST  TECHNIQUE: Multidetector CT imaging of the abdomen and pelvis was performed using the standard protocol following bolus administration of intravenous contrast. Sagittal and coronal MPR images reconstructed from axial data set.  CONTRAST: 171mL ISOVUE-300 IOPAMIDOL (ISOVUE-300) INJECTION 61% IV. Dilute oral contrast.  COMPARISON: None  FINDINGS: Lower chest: Lung bases clear  Hepatobiliary: Minimal focal fatty infiltration of liver adjacent to falciform fissure. Gallbladder and liver otherwise normal appearance  Pancreas: Normal appearance  Spleen: Normal appearance  Adrenals/Urinary Tract: Adrenal glands normal appearance. Tiny RIGHT renal cyst. Area of ill-defined nephrographic impairment at the mid LEFT kidney question pyelonephritis. No definite mass lesion seen at this site. No urinary tract calcification or dilatation. Low descent of urinary bladder base consistent with a small  cystocele.  Stomach/Bowel: Stomach and small bowel loops  normal appearance.: Unremarkable. Fluid attenuation structure identified adjacent to the tip of the cecum question mucocele of the appendix 3.2 x 2.0 x 2.8 cm.  Vascular/Lymphatic: Atherosclerotic calcifications of aorta and iliac arteries. Aorta normal caliber. No adenopathy.  Reproductive: Uterus surgically absent. Nonvisualization of ovaries.  Other: No free air or free fluid. No hernia.  Musculoskeletal: Osseous structures unremarkable.  IMPRESSION: Question mucocele of the appendix 3.2 x 2.8 x 2.0 cm in size.  Focal area of patchy nephrographic abnormality at the lateral mid LEFT kidney most consistent with pyelonephritis; correlation with urinalysis recommended.  Small cystocele.  No additional intra-abdominal or intrapelvic abnormalities.   Electronically Signed  By: Lavonia Dana M.D.  On: 09/03/2018 14:00  Other Result Information  Interface, Rad Results In - 09/03/2018  2:03 PM EST CLINICAL DATA:  LEFT upper quadrant abdominal pain for 4 months question gastroenteritis or colitis  EXAM: CT ABDOMEN AND PELVIS WITH CONTRAST  TECHNIQUE: Multidetector CT imaging of the abdomen and pelvis was performed using the standard protocol following bolus administration of intravenous contrast. Sagittal and coronal MPR images reconstructed from axial data set.  CONTRAST:  155mL ISOVUE-300 IOPAMIDOL (ISOVUE-300) INJECTION 61% IV. Dilute oral contrast.  COMPARISON:  None  FINDINGS: Lower chest: Lung bases clear  Hepatobiliary: Minimal focal fatty infiltration of liver adjacent to falciform fissure. Gallbladder and liver otherwise normal appearance  Pancreas: Normal appearance  Spleen: Normal appearance  Adrenals/Urinary Tract: Adrenal glands normal appearance. Tiny RIGHT renal cyst. Area of ill-defined nephrographic impairment at the mid LEFT kidney question pyelonephritis. No definite mass lesion seen at this site. No urinary tract calcification or dilatation.  Low descent of urinary bladder base consistent with a small cystocele.  Stomach/Bowel: Stomach and small bowel loops normal appearance.: Unremarkable. Fluid attenuation structure identified adjacent to the tip of the cecum question mucocele of the appendix 3.2 x 2.0 x 2.8 cm.  Vascular/Lymphatic: Atherosclerotic calcifications of aorta and iliac arteries. Aorta normal caliber. No adenopathy.  Reproductive: Uterus surgically absent. Nonvisualization of ovaries.  Other: No free air or free fluid.  No hernia.  Musculoskeletal: Osseous structures unremarkable.  IMPRESSION: Question mucocele of the appendix 3.2 x 2.8 x 2.0 cm in size.  Focal area of patchy nephrographic abnormality at the lateral mid LEFT kidney most consistent with pyelonephritis; correlation with urinalysis recommended.  Small cystocele.  No additional intra-abdominal or intrapelvic abnormalities.   Electronically Signed   By: Lavonia Dana M.D.   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  The risk oflaparoscopic colon resectionsurgery includes, but not limited to, recurrence, bleeding, chronic pain, post-op infxn, post-op SBO or ileus, hernias, resection of bowel, re-anastamosis, possible ostomy placement and need for re-operation to address said risks. The risks of general anesthetic, if used, includes MI, CVA, sudden death or even reaction to anesthetic medications also discussed. Alternatives include continued observation.Benefits include possible symptom relief, preventing further decline in health and possible death.  Typical post-op recovery time of additional days in hospital for observation afterwards also discussed.  Will order prep once  patient discusses with husband and commits to date. Will proceed with ERAS protocol as well. Pending medical clearance and workup as noted above.   We also discussed  need for COVID-19 testing prior to procedure.  The patientand daughter verbalized understanding and all questions were answered to the patient's satisfaction.

## 2018-12-13 NOTE — H&P (Signed)
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 female who was referred by Alisa Graff, MD for evaluation of above. First noted incidentally on CT scan for LUQ pain workup almost three months ago.    Still occasionally complains of intermittent discomfort in left upper quadrant area but is not requiring any specific intervention.  Never had pain in the right lower quadrant.  After being notified of the CT scan results when initially reported, believe that since she is minimally asymptomatic there was no need for further follow-up until now.  Today she comes in with her daughter.  Review of systems is otherwise unremarkable as noted below.    Past Medical History:  has a past medical history of COPD (chronic obstructive pulmonary disease) (CMS-HCC), Hypercholesterolemia, and Osteoarthritis.  Past Surgical History:  has a past surgical history that includes Hysterectomy (07/15/1995); Tonsillectomy; Colonoscopy (09/22/2008, 09/19/2003); egd (09/22/2008); Colonoscopy (12/05/2014); biopsy of breast (Right, 07/14/2014); and lump removal from breast (Right, 07/15/1975).  Family History: family history includes Colon cancer in her father; Myocardial Infarction (Heart attack) in her father; No Known Problems in her brother; Stroke in her father; Thyroid disease in her mother.  Social History:  reports that she has quit smoking. She has never used smokeless tobacco. She reports current alcohol use. She reports that she does not use drugs.  Current Medications: has a current medication list which includes the following prescription(s): calcium carbonate-vitamin d3, cholecalciferol, ipratropium-albuterol, omeprazole, and rosuvastatin, and the following Facility-Administered Medications: denosumab.  Allergies:       Allergies  Allergen Reactions  . Atorvastatin Hives  . Boniva [Ibandronate] Other (See Comments)    Aching  . Codeine Sulfate Swelling     Facial swelling  . Fosamax [Alendronate] Other (See Comments)    Caused aching.  Marland Kitchen Penicillin V Potassium Unknown    ROS:  A 15 point review of systems was performed and pertinent positives and negatives noted in HPI   Objective:   BP 148/88   Pulse 83   Ht 162.6 cm (5\' 4" )   Wt 85.3 kg (188 lb)   BMI 32.27 kg/m   Constitutional :  alert, appears stated age, cooperative and no distress  Lymphatics/Throat:  no asymmetry, masses, or scars  Respiratory:  clear to auscultation bilaterally  Cardiovascular:  regular rate and rhythm  Gastrointestinal: soft, non-tender; bowel sounds normal; no masses,  no organomegaly.    Musculoskeletal: Steady gait and movement  Skin: Cool and moist  Psychiatric: Normal affect, non-agitated, not confused       LABS:  n/a  RADS: CLINICAL DATA: LEFT upper quadrant abdominal pain for 4 months question gastroenteritis or colitis  EXAM: CT ABDOMEN AND PELVIS WITH CONTRAST  TECHNIQUE: Multidetector CT imaging of the abdomen and pelvis was performed using the standard protocol following bolus administration of intravenous contrast. Sagittal and coronal MPR images reconstructed from axial data set.  CONTRAST: 135mL ISOVUE-300 IOPAMIDOL (ISOVUE-300) INJECTION 61% IV. Dilute oral contrast.  COMPARISON: None  FINDINGS: Lower chest: Lung bases clear  Hepatobiliary: Minimal focal fatty infiltration of liver adjacent to falciform fissure. Gallbladder and liver otherwise normal appearance  Pancreas: Normal appearance  Spleen: Normal appearance  Adrenals/Urinary Tract: Adrenal glands normal appearance. Tiny RIGHT renal cyst. Area of ill-defined nephrographic impairment at the mid LEFT kidney question pyelonephritis. No definite mass lesion seen at this site. No urinary tract calcification or dilatation. Low descent of urinary bladder base consistent with a small  cystocele.  Stomach/Bowel: Stomach and small bowel loops  normal appearance.: Unremarkable. Fluid attenuation structure identified adjacent to the tip of the cecum question mucocele of the appendix 3.2 x 2.0 x 2.8 cm.  Vascular/Lymphatic: Atherosclerotic calcifications of aorta and iliac arteries. Aorta normal caliber. No adenopathy.  Reproductive: Uterus surgically absent. Nonvisualization of ovaries.  Other: No free air or free fluid. No hernia.  Musculoskeletal: Osseous structures unremarkable.  IMPRESSION: Question mucocele of the appendix 3.2 x 2.8 x 2.0 cm in size.  Focal area of patchy nephrographic abnormality at the lateral mid LEFT kidney most consistent with pyelonephritis; correlation with urinalysis recommended.  Small cystocele.  No additional intra-abdominal or intrapelvic abnormalities.   Electronically Signed  By: Lavonia Dana M.D.  On: 09/03/2018 14:00  Other Result Information  Interface, Rad Results In - 09/03/2018  2:03 PM EST CLINICAL DATA:  LEFT upper quadrant abdominal pain for 4 months question gastroenteritis or colitis  EXAM: CT ABDOMEN AND PELVIS WITH CONTRAST  TECHNIQUE: Multidetector CT imaging of the abdomen and pelvis was performed using the standard protocol following bolus administration of intravenous contrast. Sagittal and coronal MPR images reconstructed from axial data set.  CONTRAST:  162mL ISOVUE-300 IOPAMIDOL (ISOVUE-300) INJECTION 61% IV. Dilute oral contrast.  COMPARISON:  None  FINDINGS: Lower chest: Lung bases clear  Hepatobiliary: Minimal focal fatty infiltration of liver adjacent to falciform fissure. Gallbladder and liver otherwise normal appearance  Pancreas: Normal appearance  Spleen: Normal appearance  Adrenals/Urinary Tract: Adrenal glands normal appearance. Tiny RIGHT renal cyst. Area of ill-defined nephrographic impairment at the mid LEFT kidney question pyelonephritis. No definite mass lesion seen at this site. No urinary tract calcification or dilatation.  Low descent of urinary bladder base consistent with a small cystocele.  Stomach/Bowel: Stomach and small bowel loops normal appearance.: Unremarkable. Fluid attenuation structure identified adjacent to the tip of the cecum question mucocele of the appendix 3.2 x 2.0 x 2.8 cm.  Vascular/Lymphatic: Atherosclerotic calcifications of aorta and iliac arteries. Aorta normal caliber. No adenopathy.  Reproductive: Uterus surgically absent. Nonvisualization of ovaries.  Other: No free air or free fluid.  No hernia.  Musculoskeletal: Osseous structures unremarkable.  IMPRESSION: Question mucocele of the appendix 3.2 x 2.8 x 2.0 cm in size.  Focal area of patchy nephrographic abnormality at the lateral mid LEFT kidney most consistent with pyelonephritis; correlation with urinalysis recommended.  Small cystocele.  No additional intra-abdominal or intrapelvic abnormalities.   Electronically Signed   By: Lavonia Dana M.D.   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  The risk oflaparoscopic colon resectionsurgery includes, but not limited to, recurrence, bleeding, chronic pain, post-op infxn, post-op SBO or ileus, hernias, resection of bowel, re-anastamosis, possible ostomy placement and need for re-operation to address said risks. The risks of general anesthetic, if used, includes MI, CVA, sudden death or even reaction to anesthetic medications also discussed. Alternatives include continued observation.Benefits include possible symptom relief, preventing further decline in health and possible death.  Typical post-op recovery time of additional days in hospital for observation afterwards also discussed.  Will order prep once  patient discusses with husband and commits to date. Will proceed with ERAS protocol as well. Pending medical clearance and workup as noted above.   We also discussed  need for COVID-19 testing prior to procedure.  The patientand daughter verbalized understanding and all questions were answered to the patient's satisfaction.

## 2018-12-17 ENCOUNTER — Other Ambulatory Visit: Payer: Self-pay

## 2018-12-17 ENCOUNTER — Encounter
Admission: RE | Admit: 2018-12-17 | Discharge: 2018-12-17 | Disposition: A | Discharge status: Home / Self Care | Payer: Medicare HMO | Source: Ambulatory Visit | Attending: Surgery | Admitting: Surgery

## 2018-12-17 DIAGNOSIS — Z01818 Encounter for other preprocedural examination: Secondary | ICD-10-CM | POA: Insufficient documentation

## 2018-12-17 DIAGNOSIS — R9431 Abnormal electrocardiogram [ECG] [EKG]: Secondary | ICD-10-CM | POA: Insufficient documentation

## 2018-12-17 DIAGNOSIS — Z1159 Encounter for screening for other viral diseases: Secondary | ICD-10-CM | POA: Insufficient documentation

## 2018-12-17 DIAGNOSIS — J449 Chronic obstructive pulmonary disease, unspecified: Secondary | ICD-10-CM | POA: Diagnosis not present

## 2018-12-17 HISTORY — DX: Chronic obstructive pulmonary disease, unspecified: J44.9

## 2018-12-17 HISTORY — DX: Gastro-esophageal reflux disease without esophagitis: K21.9

## 2018-12-17 LAB — TYPE AND SCREEN
ABO/RH(D): O POS
Antibody Screen: NEGATIVE

## 2018-12-17 LAB — CBC
HCT: 42.6 % (ref 36.0–46.0)
Hemoglobin: 13.6 g/dL (ref 12.0–15.0)
MCH: 30.6 pg (ref 26.0–34.0)
MCHC: 31.9 g/dL (ref 30.0–36.0)
MCV: 95.7 fL (ref 80.0–100.0)
Platelets: 290 10*3/uL (ref 150–400)
RBC: 4.45 MIL/uL (ref 3.87–5.11)
RDW: 13.2 % (ref 11.5–15.5)
WBC: 5.6 10*3/uL (ref 4.0–10.5)
nRBC: 0 % (ref 0.0–0.2)

## 2018-12-17 NOTE — Pre-Procedure Instructions (Signed)
Messaged Dr Rosey Bath regarding abnormal EKG. He compared it to the one from 2015 and he said it looked the same and as long as pt is not having any cardiac symptoms we should be fine. Pt denied any cardiac symptoms in interview

## 2018-12-17 NOTE — Patient Instructions (Signed)
Your procedure is scheduled on: 12-21-18 TUESDAY Report to Same Day Surgery 2nd floor medical mall [facility] Entrance-take elevator on left to 2nd floor.  Check in with surgery information desk.) To find out your arrival time please call [phone] between 1PM - 3PM on 12-20-18 MONDAY  Remember: Instructions that are not followed completely may result in serious medical risk, up to and including death, or upon the discretion of your surgeon and anesthesiologist your surgery may need to be rescheduled.    _x___ 1. Do not eat food after midnight the night before your procedure. NO GUM OR CANDY AFTER MIDNIGHT. You may drink clear liquids up to 2 hours before you are scheduled to arrive at the hospital for your procedure.  Do not drink clear liquids within 2 hours of your scheduled arrival to the hospital.  Clear liquids include  --Water or Apple juice without pulp  --Clear carbohydrate beverage such as ClearFast or Gatorade  --Black Coffee or Clear Tea (No milk, no creamers, do not add anything to the coffee or Tea   ____Ensure clear carbohydrate drink on the way to the hospital for bariatric patients  ____Ensure clear carbohydrate drink 3 hours before surgery for Dr Dwyane Luo patients if physician instructed.     __x__ 2. No Alcohol for 24 hours before or after surgery.   __x__3. No Smoking or e-cigarettes for 24 prior to surgery.  Do not use any chewable tobacco products for at least 6 hour prior to surgery   ____  4. Bring all medications with you on the day of surgery if instructed.    __x__ 5. Notify your doctor if there is any change in your medical condition     (cold, fever, infections).    x___6. On the morning of surgery brush your teeth with toothpaste and water.  You may rinse your mouth with mouth wash if you wish.  Do not swallow any toothpaste or mouthwash.   Do not wear jewelry, make-up, hairpins, clips or nail polish.  Do not wear lotions, powders, or perfumes. You  may wear deodorant.  Do not shave 48 hours prior to surgery. Men may shave face and neck.  Do not bring valuables to the hospital.    [facility] is not responsible for any belongings or valuables.               Contacts, dentures or bridgework may not be worn into surgery.  Leave your suitcase in the car. After surgery it may be brought to your room.  For patients admitted to the hospital, discharge time is determined by your treatment team.  _  Patients discharged the day of surgery will not be allowed to drive home.  You will need someone to drive you home and stay with you the night of your procedure.    Please read over the following fact sheets that you were given:   [facility] Preparing for Surgery   _x___ TAKE THE FOLLOWING MEDICATION THE MORNING OF SURGERY WITH A SMALL SIP OF WATER. These include:  1. CRESTOR (ROSUVASTATIN)  2. PRILOSEC (OMEPRAZOLE)  3. TAKE AN EXTRA PRILOSEC THE NIGHT BEFORE YOUR SURGERY  4.  5.  6.  ____Fleets enema or Magnesium Citrate as directed.   _x___ Use CHG Soap or sage wipes as directed on instruction sheet   ____ Use inhalers on the day of surgery and bring to hospital day of surgery  ____ Stop Metformin and Janumet 2 days prior to surgery.  ____ Take 1/2 of usual insulin dose the night before surgery and none on the morning surgery.   ____ Follow recommendations from Cardiologist, Pulmonologist or PCP regarding stopping Aspirin, Coumadin, Plavix ,Eliquis, Effient, or Pradaxa, and Pletal.  X____Stop Anti-inflammatories such as Advil, Aleve, Ibuprofen, Motrin, Naproxen, Naprosyn, Goodies powders or aspirin products. OK to take Tylenol    ____ Stop supplements until after surgery.    ____ Bring C-Pap to the hospital.

## 2018-12-17 NOTE — Pre-Procedure Instructions (Signed)
NM myocardial perfusion SPECT multiple (stress and rest)09/11/2015 [place] Result Impression  Negative ETT with no arrhythmia or ischemia  Result Narrative  Procedure: Exercise Myocardial Perfusion Imaging ONE day procedure  Indication: Atypical chest pain - Plan: NM myocardial perfusion SPECT  multiple (stress and rest), ECG stress test only Ordering Physician:   Dr. Bartholome Bill   Clinical History: 73 y.o. year old female Vitals: Height: 63 inWeight: 184 lb Cardiac risk factors include:  COPD, Hyperlipidemia and Family Hx CAD    Procedure: The patient performed treadmill exercise using a Bruce protocol for 6:00  minutes. The exercise test was stopped due to SOB/fatigue.Blood pressure  response was normal.   Rest HR: 72bpm Rest BP: 132/39mmHg Max HR: 160bpm Max BP: 160/71mmHg Mets: 7.00 % MAX HR: 104%  Stress Test Administered by: Oswald Hillock, CMA  ECG Interpretation: Rest PQA:ESLPNP sinus rhythm, none Stress YYF:RTMYT tachycardia, no arrhythmia or ischemia Recovery RZN:BVAPOL sinus rhythm ECG Interpretation:negative, nondiagnostic changes.   Administrations This Visit  technetium Tc14m sestamibi (CARDIOLITE) injection 41.03 millicurie  Admin Date Action Dose Route Administered By      08/13/4386 Given 87.57 millicurie Intravenous Rennie Natter, CNMT      technetium Tc45m sestamibi (CARDIOLITE) injection 97.2 millicurie  Admin Date Action Dose Route Administered By      82/12/154 Given 15.3 millicurie Intravenous Rennie Natter, CNMT        Gated post-stress perfusion imaging was performed 30 minutes after stress.  Rest images were performed 30 minutes after injection.  Gated LV Analysis:  TID:1.01  LVEF= 64%  FINDINGS: Regional wall motion:reveals normal myocardial thickening and wall  motion. The overall quality of the study is good. Artifacts noted: no Left  ventricular cavity: normal.  Perfusion Analysis:SPECT images demonstrate homogeneous tracer  distribution throughout the myocardium.  Status Results Details   Unavailable

## 2018-12-18 LAB — NOVEL CORONAVIRUS, NAA (HOSP ORDER, SEND-OUT TO REF LAB; TAT 18-24 HRS): SARS-CoV-2, NAA: NOT DETECTED

## 2018-12-20 MED ORDER — CIPROFLOXACIN IN D5W 400 MG/200ML IV SOLN
400.0000 mg | INTRAVENOUS | Status: AC
  Administered 2018-12-21: 400 mg via INTRAVENOUS

## 2018-12-20 MED ORDER — METRONIDAZOLE IN NACL 5-0.79 MG/ML-% IV SOLN
500.0000 mg | INTRAVENOUS | Status: AC
  Administered 2018-12-21: 500 mg via INTRAVENOUS
  Filled 2018-12-20: qty 100

## 2018-12-21 ENCOUNTER — Other Ambulatory Visit: Payer: Self-pay

## 2018-12-21 ENCOUNTER — Inpatient Hospital Stay: Payer: Medicare HMO | Admitting: Anesthesiology

## 2018-12-21 ENCOUNTER — Encounter: Payer: Self-pay | Admitting: *Deleted

## 2018-12-21 ENCOUNTER — Encounter
Admission: RE | Disposition: A | Discharge status: Home / Self Care | Payer: Self-pay | Source: Home / Self Care | Attending: Surgery

## 2018-12-21 ENCOUNTER — Inpatient Hospital Stay
Admission: RE | Admit: 2018-12-21 | Discharge: 2018-12-22 | DRG: 343 | Disposition: A | Discharge status: Home / Self Care | Payer: Medicare HMO | Attending: Surgery | Admitting: Surgery

## 2018-12-21 DIAGNOSIS — Z8349 Family history of other endocrine, nutritional and metabolic diseases: Secondary | ICD-10-CM

## 2018-12-21 DIAGNOSIS — K388 Other specified diseases of appendix: Principal | ICD-10-CM | POA: Diagnosis present

## 2018-12-21 DIAGNOSIS — J449 Chronic obstructive pulmonary disease, unspecified: Secondary | ICD-10-CM | POA: Diagnosis present

## 2018-12-21 DIAGNOSIS — E7800 Pure hypercholesterolemia, unspecified: Secondary | ICD-10-CM | POA: Diagnosis present

## 2018-12-21 DIAGNOSIS — Z800 Family history of malignant neoplasm of digestive organs: Secondary | ICD-10-CM

## 2018-12-21 DIAGNOSIS — Z5331 Laparoscopic surgical procedure converted to open procedure: Secondary | ICD-10-CM

## 2018-12-21 DIAGNOSIS — Z87891 Personal history of nicotine dependence: Secondary | ICD-10-CM

## 2018-12-21 DIAGNOSIS — Z880 Allergy status to penicillin: Secondary | ICD-10-CM

## 2018-12-21 DIAGNOSIS — Z888 Allergy status to other drugs, medicaments and biological substances status: Secondary | ICD-10-CM

## 2018-12-21 DIAGNOSIS — Z885 Allergy status to narcotic agent status: Secondary | ICD-10-CM

## 2018-12-21 DIAGNOSIS — Z823 Family history of stroke: Secondary | ICD-10-CM

## 2018-12-21 DIAGNOSIS — E669 Obesity, unspecified: Secondary | ICD-10-CM | POA: Diagnosis not present

## 2018-12-21 DIAGNOSIS — M1990 Unspecified osteoarthritis, unspecified site: Secondary | ICD-10-CM | POA: Diagnosis present

## 2018-12-21 DIAGNOSIS — Z90710 Acquired absence of both cervix and uterus: Secondary | ICD-10-CM

## 2018-12-21 DIAGNOSIS — K219 Gastro-esophageal reflux disease without esophagitis: Secondary | ICD-10-CM | POA: Diagnosis present

## 2018-12-21 DIAGNOSIS — Z8249 Family history of ischemic heart disease and other diseases of the circulatory system: Secondary | ICD-10-CM

## 2018-12-21 HISTORY — PX: LAPAROSCOPIC APPENDECTOMY: SHX408

## 2018-12-21 LAB — CBC
HCT: 43.2 % (ref 36.0–46.0)
Hemoglobin: 13.7 g/dL (ref 12.0–15.0)
MCH: 30.6 pg (ref 26.0–34.0)
MCHC: 31.7 g/dL (ref 30.0–36.0)
MCV: 96.6 fL (ref 80.0–100.0)
Platelets: 245 10*3/uL (ref 150–400)
RBC: 4.47 MIL/uL (ref 3.87–5.11)
RDW: 13.2 % (ref 11.5–15.5)
WBC: 15.1 10*3/uL — ABNORMAL HIGH (ref 4.0–10.5)
nRBC: 0 % (ref 0.0–0.2)

## 2018-12-21 LAB — CREATININE, SERUM
Creatinine, Ser: 0.66 mg/dL (ref 0.44–1.00)
GFR calc Af Amer: >60 mL/min (ref >60)
GFR calc non Af Amer: >60 mL/min (ref >60)

## 2018-12-21 LAB — ABO/RH: ABO/RH(D): O POS

## 2018-12-21 SURGERY — APPENDECTOMY, LAPAROSCOPIC
Anesthesia: General | Site: Abdomen

## 2018-12-21 MED ORDER — PANTOPRAZOLE SODIUM 40 MG PO TBEC
40.0000 mg | DELAYED_RELEASE_TABLET | Freq: Every day | ORAL | Status: DC
  Administered 2018-12-22: 40 mg via ORAL
  Filled 2018-12-21: qty 1

## 2018-12-21 MED ORDER — FENTANYL CITRATE (PF) 100 MCG/2ML IJ SOLN
INTRAMUSCULAR | Status: DC | PRN
  Administered 2018-12-21 (×2): 50 ug via INTRAVENOUS

## 2018-12-21 MED ORDER — LIDOCAINE HCL (PF) 2 % IJ SOLN
INTRAMUSCULAR | Status: AC
  Filled 2018-12-21: qty 10

## 2018-12-21 MED ORDER — VITAMIN D 25 MCG (1000 UNIT) PO TABS
2000.0000 [IU] | ORAL_TABLET | Freq: Every day | ORAL | Status: DC
  Administered 2018-12-22: 2000 [IU] via ORAL
  Filled 2018-12-21: qty 2

## 2018-12-21 MED ORDER — BUPIVACAINE LIPOSOME 1.3 % IJ SUSP
INTRAMUSCULAR | Status: AC
  Filled 2018-12-21: qty 20

## 2018-12-21 MED ORDER — BUPIVACAINE-EPINEPHRINE (PF) 0.5% -1:200000 IJ SOLN
INTRAMUSCULAR | Status: DC | PRN
  Administered 2018-12-21: 30 mL via PERINEURAL

## 2018-12-21 MED ORDER — CALCIUM CARBONATE-VITAMIN D 500-200 MG-UNIT PO TABS
1.0000 | ORAL_TABLET | Freq: Every day | ORAL | Status: DC
  Administered 2018-12-22: 1 via ORAL
  Filled 2018-12-21: qty 1

## 2018-12-21 MED ORDER — RISAQUAD PO CAPS
ORAL_CAPSULE | Freq: Every day | ORAL | Status: DC
  Administered 2018-12-22: 1 via ORAL
  Filled 2018-12-21: qty 1

## 2018-12-21 MED ORDER — FENTANYL CITRATE (PF) 100 MCG/2ML IJ SOLN
INTRAMUSCULAR | Status: AC
  Filled 2018-12-21: qty 2

## 2018-12-21 MED ORDER — CIPROFLOXACIN IN D5W 400 MG/200ML IV SOLN
INTRAVENOUS | Status: AC
  Filled 2018-12-21: qty 200

## 2018-12-21 MED ORDER — PROPOFOL 10 MG/ML IV BOLUS
INTRAVENOUS | Status: AC
  Filled 2018-12-21: qty 20

## 2018-12-21 MED ORDER — OXYCODONE HCL 5 MG PO TABS: 5.0000 mg | ORAL_TABLET | Freq: Once | ORAL | Status: DC | PRN

## 2018-12-21 MED ORDER — GABAPENTIN 300 MG PO CAPS
ORAL_CAPSULE | ORAL | Status: AC
  Administered 2018-12-21: 300 mg via ORAL
  Filled 2018-12-21: qty 1

## 2018-12-21 MED ORDER — CHLORHEXIDINE GLUCONATE CLOTH 2 % EX PADS: 6.0000 | MEDICATED_PAD | Freq: Once | CUTANEOUS | Status: DC

## 2018-12-21 MED ORDER — CELECOXIB 200 MG PO CAPS
ORAL_CAPSULE | ORAL | Status: AC
  Administered 2018-12-21: 200 mg via ORAL
  Filled 2018-12-21: qty 1

## 2018-12-21 MED ORDER — CELECOXIB 200 MG PO CAPS
200.0000 mg | ORAL_CAPSULE | ORAL | Status: AC
  Administered 2018-12-21: 200 mg via ORAL

## 2018-12-21 MED ORDER — SUGAMMADEX SODIUM 200 MG/2ML IV SOLN
INTRAVENOUS | Status: AC
  Filled 2018-12-21: qty 2

## 2018-12-21 MED ORDER — ROSUVASTATIN CALCIUM 10 MG PO TABS: 5.0000 mg | ORAL_TABLET | ORAL | Status: DC

## 2018-12-21 MED ORDER — SEVOFLURANE IN SOLN
RESPIRATORY_TRACT | Status: AC
  Filled 2018-12-21: qty 250

## 2018-12-21 MED ORDER — SUGAMMADEX SODIUM 500 MG/5ML IV SOLN
INTRAVENOUS | Status: DC | PRN
  Administered 2018-12-21: 300 mg via INTRAVENOUS

## 2018-12-21 MED ORDER — LIDOCAINE HCL (CARDIAC) PF 100 MG/5ML IV SOSY
PREFILLED_SYRINGE | INTRAVENOUS | Status: DC | PRN
  Administered 2018-12-21: 60 mg via INTRAVENOUS

## 2018-12-21 MED ORDER — OXYCODONE HCL 5 MG/5ML PO SOLN: 5.0000 mg | Freq: Once | ORAL | Status: DC | PRN

## 2018-12-21 MED ORDER — MIDAZOLAM HCL 2 MG/2ML IJ SOLN
INTRAMUSCULAR | Status: DC | PRN
  Administered 2018-12-21: 1 mg via INTRAVENOUS

## 2018-12-21 MED ORDER — PROPOFOL 10 MG/ML IV BOLUS
INTRAVENOUS | Status: DC | PRN
  Administered 2018-12-21: 130 mg via INTRAVENOUS

## 2018-12-21 MED ORDER — ROCURONIUM BROMIDE 50 MG/5ML IV SOLN
INTRAVENOUS | Status: AC
  Filled 2018-12-21: qty 1

## 2018-12-21 MED ORDER — MIDAZOLAM HCL 2 MG/2ML IJ SOLN
INTRAMUSCULAR | Status: AC
  Filled 2018-12-21: qty 2

## 2018-12-21 MED ORDER — ROCURONIUM BROMIDE 100 MG/10ML IV SOLN
INTRAVENOUS | Status: DC | PRN
  Administered 2018-12-21: 20 mg via INTRAVENOUS
  Administered 2018-12-21: 50 mg via INTRAVENOUS

## 2018-12-21 MED ORDER — ONDANSETRON HCL 4 MG/2ML IJ SOLN
INTRAMUSCULAR | Status: DC | PRN
  Administered 2018-12-21: 4 mg via INTRAVENOUS

## 2018-12-21 MED ORDER — ACETAMINOPHEN 325 MG PO TABS
650.0000 mg | ORAL_TABLET | Freq: Four times a day (QID) | ORAL | Status: DC | PRN
  Administered 2018-12-21 – 2018-12-22 (×3): 650 mg via ORAL
  Filled 2018-12-21 (×3): qty 2

## 2018-12-21 MED ORDER — FENTANYL CITRATE (PF) 100 MCG/2ML IJ SOLN
25.0000 ug | INTRAMUSCULAR | Status: DC | PRN
  Administered 2018-12-21 (×3): 50 ug via INTRAVENOUS

## 2018-12-21 MED ORDER — ACETAMINOPHEN 500 MG PO TABS
ORAL_TABLET | ORAL | Status: AC
  Administered 2018-12-21: 1000 mg via ORAL
  Filled 2018-12-21: qty 2

## 2018-12-21 MED ORDER — SODIUM CHLORIDE FLUSH 0.9 % IV SOLN
INTRAVENOUS | Status: AC
  Filled 2018-12-21: qty 30

## 2018-12-21 MED ORDER — LACTATED RINGERS IV SOLN
INTRAVENOUS | Status: DC
  Administered 2018-12-21 (×2): via INTRAVENOUS

## 2018-12-21 MED ORDER — ENOXAPARIN SODIUM 40 MG/0.4ML ~~LOC~~ SOLN
40.0000 mg | SUBCUTANEOUS | Status: DC
  Filled 2018-12-21: qty 0.4

## 2018-12-21 MED ORDER — SODIUM CHLORIDE 0.9 % IV SOLN
INTRAVENOUS | Status: DC | PRN
  Administered 2018-12-21: 09:00:00

## 2018-12-21 MED ORDER — BUPIVACAINE-EPINEPHRINE (PF) 0.5% -1:200000 IJ SOLN
INTRAMUSCULAR | Status: AC
  Filled 2018-12-21: qty 30

## 2018-12-21 MED ORDER — HYDROMORPHONE HCL 1 MG/ML IJ SOLN
INTRAMUSCULAR | Status: AC
  Filled 2018-12-21: qty 1

## 2018-12-21 MED ORDER — DENOSUMAB 60 MG/ML ~~LOC~~ SOSY: 60.0000 mg | PREFILLED_SYRINGE | SUBCUTANEOUS | Status: DC

## 2018-12-21 MED ORDER — EPHEDRINE SULFATE 50 MG/ML IJ SOLN
INTRAMUSCULAR | Status: AC
  Filled 2018-12-21: qty 1

## 2018-12-21 MED ORDER — ACETAMINOPHEN 500 MG PO TABS
1000.0000 mg | ORAL_TABLET | ORAL | Status: AC
  Administered 2018-12-21: 1000 mg via ORAL

## 2018-12-21 MED ORDER — ONDANSETRON HCL 4 MG/2ML IJ SOLN
INTRAMUSCULAR | Status: AC
  Filled 2018-12-21: qty 2

## 2018-12-21 MED ORDER — PHENYLEPHRINE HCL (PRESSORS) 10 MG/ML IV SOLN
INTRAVENOUS | Status: AC
  Filled 2018-12-21: qty 1

## 2018-12-21 MED ORDER — HYDROMORPHONE HCL 1 MG/ML IJ SOLN
INTRAMUSCULAR | Status: DC | PRN
  Administered 2018-12-21: .2 mg via INTRAVENOUS

## 2018-12-21 MED ORDER — GABAPENTIN 300 MG PO CAPS
300.0000 mg | ORAL_CAPSULE | ORAL | Status: AC
  Administered 2018-12-21: 300 mg via ORAL

## 2018-12-21 MED ORDER — PHENYLEPHRINE HCL (PRESSORS) 10 MG/ML IV SOLN
INTRAVENOUS | Status: DC | PRN
  Administered 2018-12-21 (×3): 100 ug via INTRAVENOUS

## 2018-12-21 SURGICAL SUPPLY — 57 items
BLADE SURG SZ10 CARB STEEL (BLADE) ×3 IMPLANT
CANISTER SUCT 1200ML W/VALVE (MISCELLANEOUS) ×3 IMPLANT
CNTNR SPEC 2.5X3XGRAD LEK (MISCELLANEOUS) ×2
CONT SPEC 4OZ STER OR WHT (MISCELLANEOUS) ×1
CONTAINER SPEC 2.5X3XGRAD LEK (MISCELLANEOUS) ×2 IMPLANT
COVER WAND RF STERILE (DRAPES) ×3 IMPLANT
DECANTER SPIKE VIAL GLASS SM (MISCELLANEOUS) ×3 IMPLANT
DEFOGGER SCOPE WARMER CLEARIFY (MISCELLANEOUS) ×3 IMPLANT
DERMABOND ADVANCED (GAUZE/BANDAGES/DRESSINGS) ×2
DERMABOND ADVANCED .7 DNX12 (GAUZE/BANDAGES/DRESSINGS) ×4 IMPLANT
DRAPE INCISE IOBAN 66X45 STRL (DRAPES) ×3 IMPLANT
ELECT BLADE 6.5 EXT (BLADE) ×3 IMPLANT
ELECT CAUTERY BLADE 6.4 (BLADE) ×3 IMPLANT
ELECT REM PT RETURN 9FT ADLT (ELECTROSURGICAL) ×3
ELECTRODE REM PT RTRN 9FT ADLT (ELECTROSURGICAL) ×2 IMPLANT
GLOVE BIOGEL PI IND STRL 7.0 (GLOVE) ×4 IMPLANT
GLOVE BIOGEL PI INDICATOR 7.0 (GLOVE) ×2
GLOVE SURG SYN 6.5 ES PF (GLOVE) ×6 IMPLANT
GOWN STRL REUS W/ TWL LRG LVL3 (GOWN DISPOSABLE) ×8 IMPLANT
GOWN STRL REUS W/TWL LRG LVL3 (GOWN DISPOSABLE) ×4
HANDLE YANKAUER SUCT BULB TIP (MISCELLANEOUS) ×3 IMPLANT
HOLDER FOLEY CATH W/STRAP (MISCELLANEOUS) ×3 IMPLANT
IRRIGATION STRYKERFLOW (MISCELLANEOUS) IMPLANT
IRRIGATOR STRYKERFLOW (MISCELLANEOUS)
IV NS 1000ML (IV SOLUTION) ×1
IV NS 1000ML BAXH (IV SOLUTION) ×2 IMPLANT
NEEDLE HYPO 22GX1.5 SAFETY (NEEDLE) ×3 IMPLANT
NS IRRIG 1000ML POUR BTL (IV SOLUTION) ×3 IMPLANT
PACK COLON CLEAN CLOSURE (MISCELLANEOUS) ×3 IMPLANT
PACK LAP CHOLECYSTECTOMY (MISCELLANEOUS) ×3 IMPLANT
PENCIL ELECTRO HAND CTR (MISCELLANEOUS) ×3 IMPLANT
PORT ACCESS TROCAR AIRSEAL 5 (TROCAR) ×6 IMPLANT
RETRACTOR WOUND ALXS 18CM MED (MISCELLANEOUS) IMPLANT
RTRCTR WOUND ALEXIS O 18CM MED (MISCELLANEOUS)
SCISSORS METZENBAUM CVD 33 (INSTRUMENTS) ×3 IMPLANT
SET TRI-LUMEN FLTR TB AIRSEAL (TUBING) IMPLANT
SHEARS HARMONIC ACE PLUS 36CM (ENDOMECHANICALS) ×3 IMPLANT
SLEEVE ENDOPATH XCEL 5M (ENDOMECHANICALS) ×3 IMPLANT
SPONGE LAP 18X18 RF (DISPOSABLE) ×6 IMPLANT
STAPLER PROXIMATE 55 BLUE (STAPLE) ×3 IMPLANT
SUT MNCRL 4-0 (SUTURE) ×1
SUT MNCRL 4-0 27XMFL (SUTURE) ×2
SUT PDS AB 0 CT1 27 (SUTURE) ×6 IMPLANT
SUT SILK 2 0 (SUTURE) ×1
SUT SILK 2-0 30XBRD TIE 12 (SUTURE) ×2 IMPLANT
SUT SILK 3-0 (SUTURE) ×6 IMPLANT
SUT VIC AB 2-0 SH 27 (SUTURE) ×1
SUT VIC AB 2-0 SH 27XBRD (SUTURE) ×2 IMPLANT
SUT VIC AB 3-0 SH 27 (SUTURE) ×2
SUT VIC AB 3-0 SH 27X BRD (SUTURE) ×4 IMPLANT
SUT VICRYL 0 AB UR-6 (SUTURE) ×6 IMPLANT
SUTURE MNCRL 4-0 27XMF (SUTURE) ×2 IMPLANT
SYR 30ML LL (SYRINGE) ×3 IMPLANT
SYS LAPSCP GELPORT 120MM (MISCELLANEOUS) ×3
SYSTEM LAPSCP GELPORT 120MM (MISCELLANEOUS) ×2 IMPLANT
TRAY FOLEY MTR SLVR 16FR STAT (SET/KITS/TRAYS/PACK) ×3 IMPLANT
TROCAR XCEL NON-BLD 5MMX100MML (ENDOMECHANICALS) ×3 IMPLANT

## 2018-12-21 NOTE — Interval H&P Note (Signed)
History and Physical Interval Note:  12/21/2018 7:52 AM  Alexis Reed  has presented today for surgery, with the diagnosis of K38.8 MUCOCELE OF APPENDIX.  The various methods of treatment have been discussed with the patient and family. After consideration of risks, benefits and other options for treatment, the patient has consented to  Procedure(s): HAND ASSISTED LAPAROSCOPIC COLON RESECTION (N/A) as a surgical intervention.  The patient's history has been reviewed, patient examined, no change in status, stable for surgery.  I have reviewed the patient's chart and labs.  Questions were answered to the patient's satisfaction.     Dina Warbington Lysle Pearl

## 2018-12-21 NOTE — Anesthesia Post-op Follow-up Note (Signed)
Anesthesia QCDR form completed.        

## 2018-12-21 NOTE — Op Note (Addendum)
Preoperative diagnosis: Mucocele of appendix  Postoperative diagnosis: Same  Procedure: Laparoscopic hand-assisted converted to open appendectomy  Anesthesia: GETA  Surgeon: Lysle Pearl Assistant: Cintron-Diaz  Wound Classification: Clean Contaminated  Indications: Patient is a 73 y.o. female with mucocele appendix noted on CT scan.  Resection was indicated for definitive pathologic evaluation  Specimen: Appendix Complications: None  Estimated Blood Loss:  70mL  Findings: 1.  Grossly normal looking firm to palpation appendix with possible palpable mass within the mesoappendix 2.  Intraoperative pathology noted no obvious neoplasms 3.  Examination of abdominal cavity revealed no suspicious lesions 4.  Grossly normal looking right colon with no palpable mass within the lumen 5. Adequate hemostasis  Details of operation: The patient was brought to the operating room and placed on the operating table in the supine position. The abdomen was clipped, prepped with 2% chlorhexidine solution, and draped in the standard fashion. Foley placed.  A time-out was completed verifying correct patient, procedure, site, positioning, and implant(s) and/or special equipment prior to beginning this procedure.  5 mm incision made inferior to the umbilicus and Veress needle technique was used through this incision to insufflate the abdomen without any issues.  Once the abdomen was adequately insufflated, the Veress needle was removed and 5 mm port was placed using Optiview technique.  Inspection of the abdominal cavity at this point noted no injury from trocar insertion.  Additional inspection of the entire abdomen laparoscopically noted no obvious pathologic lesions or suspicious masses.  This included the area around the appendix as well.  Due to the very obvious lesion that was noted in the previous CT scan, decision was made to proceed with GelPort placement and converting to a hand-assisted exploration of the  abdominal cavity.    The 5 mm port was removed from the initial incision and incision extended into a  vertical 5 cm periumbilical incision was used to access the peritoneal cavity and a Gelport was placed.  Under direct visualization a 21mmsubxyphoid trocar was introduced and pneumoperitoneum was achieved. Another 5 mm trocar was introduced on the LUQ area. The patient was then positioned in 30 reverse Trendelenburg position with the patient's right side up to allow for small bowel to drop clear of the operative field. The 5-mm scope was introduced from the epigastric port at the initial part of the procedure. With a hand assisted technique, the white line of Toldt along the ascending colon was dissected and the right colon mobilized over to the hepatic flexure.  Palpation of the entire right colon as well as the visible appendix only noted a firm but grossly normal-looking appendix along with a possible mass within the mesoappendix.  The cecum along with the appendix was then pulled through the midline incision, and then appendectomy was performed.    Blue linear stapler was used to transverse the appendiceal base, a generous portion of the mesoappendix was removed along with the appendix itself using harmonic.  The specimen was then sent to pathology for frozen section.  Initial report was grossly normal appendix with no obvious neoplasm noted within the specimen.  With this report, along with a grossly normal looking colon and no other palpable abnormalities, decision was made to forego a extended right hemicolectomy.  Once noting adequate hemostasis at the staple line and areas of dissection, the cecum was returned to the abdominal cavity in its original position.  5 mm ports were removed under direct visualization.  Mid line fascia was closed with running PDS 0 x2.  Exparel infused to incisions prior to closing.3-0 vicryl used to close subcutaneous layer at the hand port incision prior to closing all  skin incisions with running 4-0 Monocryl in a subcuticular fashion.  Wounds then dressed with Dermabond. The sponge and instrument count at end of procedure.  The patient tolerated the procedure well and was transferred to the post-anesthesia care unit in stable condition.  Foley removed prior to transfer.

## 2018-12-21 NOTE — Anesthesia Procedure Notes (Signed)
Procedure Name: Intubation Date/Time: 12/21/2018 7:45 AM Performed by: Caryl Asp, CRNA Pre-anesthesia Checklist: Patient identified, Emergency Drugs available, Suction available, Patient being monitored and Timeout performed Oxygen Delivery Method: Circle system utilized Preoxygenation: Pre-oxygenation with 100% oxygen Induction Type: IV induction Ventilation: Mask ventilation without difficulty Laryngoscope Size: McGraph and 3 Grade View: Grade I Tube type: Oral Tube size: 7.0 mm Number of attempts: 1 Placement Confirmation: ETT inserted through vocal cords under direct vision,  positive ETCO2 and breath sounds checked- equal and bilateral Secured at: 21 cm Tube secured with: Tape

## 2018-12-21 NOTE — Transfer of Care (Signed)
Immediate Anesthesia Transfer of Care Note  Patient: Alexis Reed  Procedure(s) Performed: Procedure name not found.  Patient Location: PACU  Anesthesia Type:General  Level of Consciousness: awake, alert , oriented and patient cooperative  Airway & Oxygen Therapy: Patient Spontanous Breathing and Patient connected to face mask oxygen  Post-op Assessment: Report given to RN, Post -op Vital signs reviewed and stable, Patient moving all extremities and Patient moving all extremities X 4  Post vital signs: Reviewed and stable  Last Vitals:  Vitals Value Taken Time  BP 137/73 12/21/2018 10:08 AM  Temp 36.3 C 12/21/2018 10:08 AM  Pulse 91 12/21/2018 10:09 AM  Resp 13 12/21/2018 10:09 AM  SpO2 100 % 12/21/2018 10:09 AM  Vitals shown include unvalidated device data.  Last Pain:  Vitals:   12/21/18 0615  TempSrc: Tympanic  PainSc: 0-No pain         Complications: No apparent anesthesia complications

## 2018-12-21 NOTE — Care Management Obs Status (Signed)
[facility] NOTIFICATION   Patient Details  Name: Alexis Reed MRN: [id number] Date of Birth: 02-17-1946   Medicare Observation Status Notification Given:  Yes    Tommy Medal 12/21/2018, 4:40 PM

## 2018-12-21 NOTE — Anesthesia Postprocedure Evaluation (Signed)
Anesthesia Post Note  Patient: Alexis Reed  Procedure(s) Performed: APPENDECTOMY LAPAROSCOPIC CONVERTED TO OPEN (N/A Abdomen)  Patient location during evaluation: PACU Anesthesia Type: General Level of consciousness: awake and alert Pain management: pain level controlled Vital Signs Assessment: post-procedure vital signs reviewed and stable Respiratory status: spontaneous breathing, nonlabored ventilation, respiratory function stable and patient connected to nasal cannula oxygen Cardiovascular status: blood pressure returned to baseline and stable Postop Assessment: no apparent nausea or vomiting Anesthetic complications: no     Last Vitals:  Vitals:   12/21/18 1151 12/21/18 1214  BP: (!) 91/49 (!) 96/58  Pulse: 75 81  Resp: 18   Temp:    SpO2: 92% 96%    Last Pain:  Vitals:   12/21/18 1055  TempSrc:   PainSc: 3                  Precious Haws Piscitello

## 2018-12-21 NOTE — Anesthesia Preprocedure Evaluation (Signed)
Anesthesia Evaluation  Patient identified by MRN, date of birth, ID band Patient awake    Reviewed: Allergy & Precautions, H&P , NPO status , Patient's Chart, lab work & pertinent test results  History of Anesthesia Complications Negative for: history of anesthetic complications  Airway Mallampati: III  TM Distance: <3 FB Neck ROM: limited    Dental  (+) Chipped   Pulmonary neg pulmonary ROS, neg shortness of breath, COPD, former smoker,           Cardiovascular Exercise Tolerance: Good (-) angina(-) Past MI and (-) DOE negative cardio ROS       Neuro/Psych negative neurological ROS  negative psych ROS   GI/Hepatic Neg liver ROS, GERD  Medicated and Controlled,  Endo/Other  negative endocrine ROS  Renal/GU      Musculoskeletal  (+) Arthritis ,   Abdominal   Peds  Hematology negative hematology ROS (+)   Anesthesia Other Findings Past Medical History: No date: Arthritis No date: Benign breast lumps     Comment:  multiple lumps biopsy and remove x's 5 last being 1979               by Dr. Sharlet Salina No date: COPD (chronic obstructive pulmonary disease) ([place])     Comment:  MILD-RARELY EVER HAS TO USE COMBIVENT INHALER No date: Fibroids     Comment:  s/p hysterectomy No date: GERD (gastroesophageal reflux disease) No date: Hypercholesterolemia No date: Osteoporosis 08/07/2015: Personal history of tobacco use, presenting hazards to  health  Past Surgical History: 1997: ABDOMINAL HYSTERECTOMY     Comment:  with bilateral oophorectomy 1975: BREAST BIOPSY; Right     Comment:  multiple biopsies done No date: BREAST BIOPSY; Left     Comment:  multiple done 2016: BREAST BIOPSY; Right     Comment:  stereo No date: BREAST SURGERY; Left     Comment:  lump removed. Unsure of date No date: BREAST SURGERY; Right     Comment:  lump removed. Unsure of date 2010: COLONOSCOPY     Comment:  Dr. Vira Agar No date:  TONSILLECTOMY  BMI    Body Mass Index:  31.76 kg/m      Reproductive/Obstetrics negative OB ROS                             Anesthesia Physical Anesthesia Plan  ASA: III  Anesthesia Plan: General ETT   Post-op Pain Management:    Induction: Intravenous  PONV Risk Score and Plan: Ondansetron, Dexamethasone, Midazolam and Treatment may vary due to age or medical condition  Airway Management Planned: Oral ETT  Additional Equipment:   Intra-op Plan:   Post-operative Plan: Extubation in OR  Informed Consent: I have reviewed the patients History and Physical, chart, labs and discussed the procedure including the risks, benefits and alternatives for the proposed anesthesia with the patient or authorized representative who has indicated his/her understanding and acceptance.     Dental Advisory Given  Plan Discussed with: Anesthesiologist, CRNA and Surgeon  Anesthesia Plan Comments: (Patient consented for risks of anesthesia including but not limited to:  - adverse reactions to medications - damage to teeth, lips or other oral mucosa - sore throat or hoarseness - Damage to heart, brain, lungs or loss of life  Patient voiced understanding.)        Anesthesia Quick Evaluation

## 2018-12-22 ENCOUNTER — Encounter: Payer: Self-pay | Admitting: Surgery

## 2018-12-22 DIAGNOSIS — Z885 Allergy status to narcotic agent status: Secondary | ICD-10-CM | POA: Diagnosis not present

## 2018-12-22 DIAGNOSIS — Z90710 Acquired absence of both cervix and uterus: Secondary | ICD-10-CM | POA: Diagnosis not present

## 2018-12-22 DIAGNOSIS — E7800 Pure hypercholesterolemia, unspecified: Secondary | ICD-10-CM | POA: Diagnosis present

## 2018-12-22 DIAGNOSIS — M1990 Unspecified osteoarthritis, unspecified site: Secondary | ICD-10-CM | POA: Diagnosis present

## 2018-12-22 DIAGNOSIS — Z8249 Family history of ischemic heart disease and other diseases of the circulatory system: Secondary | ICD-10-CM | POA: Diagnosis not present

## 2018-12-22 DIAGNOSIS — Z800 Family history of malignant neoplasm of digestive organs: Secondary | ICD-10-CM | POA: Diagnosis not present

## 2018-12-22 DIAGNOSIS — K388 Other specified diseases of appendix: Secondary | ICD-10-CM | POA: Diagnosis present

## 2018-12-22 DIAGNOSIS — Z8349 Family history of other endocrine, nutritional and metabolic diseases: Secondary | ICD-10-CM | POA: Diagnosis not present

## 2018-12-22 DIAGNOSIS — J449 Chronic obstructive pulmonary disease, unspecified: Secondary | ICD-10-CM | POA: Diagnosis present

## 2018-12-22 DIAGNOSIS — Z888 Allergy status to other drugs, medicaments and biological substances status: Secondary | ICD-10-CM | POA: Diagnosis not present

## 2018-12-22 DIAGNOSIS — Z880 Allergy status to penicillin: Secondary | ICD-10-CM | POA: Diagnosis not present

## 2018-12-22 DIAGNOSIS — Z5331 Laparoscopic surgical procedure converted to open procedure: Secondary | ICD-10-CM | POA: Diagnosis not present

## 2018-12-22 DIAGNOSIS — K219 Gastro-esophageal reflux disease without esophagitis: Secondary | ICD-10-CM | POA: Diagnosis not present

## 2018-12-22 DIAGNOSIS — Z87891 Personal history of nicotine dependence: Secondary | ICD-10-CM | POA: Diagnosis not present

## 2018-12-22 DIAGNOSIS — Z823 Family history of stroke: Secondary | ICD-10-CM | POA: Diagnosis not present

## 2018-12-22 LAB — BASIC METABOLIC PANEL
Anion gap: 12 (ref 5–15)
BUN: 7 mg/dL — ABNORMAL LOW (ref 8–23)
CO2: 24 mmol/L (ref 22–32)
Calcium: 8.8 mg/dL — ABNORMAL LOW (ref 8.9–10.3)
Chloride: 103 mmol/L (ref 98–111)
Creatinine, Ser: 0.64 mg/dL (ref 0.44–1.00)
GFR calc Af Amer: >60 mL/min (ref >60)
GFR calc non Af Amer: >60 mL/min (ref >60)
Glucose, Bld: 131 mg/dL — ABNORMAL HIGH (ref 70–99)
Potassium: 3.6 mmol/L (ref 3.5–5.1)
Sodium: 139 mmol/L (ref 135–145)

## 2018-12-22 LAB — CBC
HCT: 40.7 % (ref 36.0–46.0)
Hemoglobin: 12.9 g/dL (ref 12.0–15.0)
MCH: 30.3 pg (ref 26.0–34.0)
MCHC: 31.7 g/dL (ref 30.0–36.0)
MCV: 95.5 fL (ref 80.0–100.0)
Platelets: 278 10*3/uL (ref 150–400)
RBC: 4.26 MIL/uL (ref 3.87–5.11)
RDW: 13.3 % (ref 11.5–15.5)
WBC: 10.2 10*3/uL (ref 4.0–10.5)
nRBC: 0 % (ref 0.0–0.2)

## 2018-12-22 LAB — SURGICAL PATHOLOGY

## 2018-12-22 MED ORDER — DOCUSATE SODIUM 100 MG PO CAPS: 100.0000 mg | ORAL_CAPSULE | Freq: Two times a day (BID) | ORAL | 0 refills | Status: AC | PRN

## 2018-12-22 MED ORDER — ACETAMINOPHEN 325 MG PO TABS: 650.0000 mg | ORAL_TABLET | Freq: Three times a day (TID) | ORAL | 0 refills | Status: AC | PRN

## 2018-12-22 MED ORDER — IBUPROFEN 800 MG PO TABS: 800.0000 mg | ORAL_TABLET | Freq: Three times a day (TID) | ORAL | 0 refills | Status: DC | PRN

## 2018-12-22 MED ORDER — HYDROCODONE-ACETAMINOPHEN 5-325 MG PO TABS: 1.0000 | ORAL_TABLET | Freq: Four times a day (QID) | ORAL | 0 refills | Status: AC | PRN

## 2018-12-22 NOTE — Discharge Summary (Signed)
Physician Discharge Summary  Patient ID: HAYDAN WEDIG MRN: [id number] DOB/AGE: [birth year] 71 y.o.  Admit date: 12/21/2018 Discharge date: 12/22/2018  Admission Diagnoses: Mucocele of appendix  Discharge Diagnoses:  Same as above  Discharged Condition: good  Hospital Course: Admitted for procedure above.  Procedure ended up being laparoscopic converted to an open appendectomy.  Due to need to convert to an open procedure, patient was admitted postop.  She was able to eventually tolerate a regular diet without any issues, pain control with oral meds only, and the incisions remain clean dry and intact.  Patient was therefore deemed appropriate for discharge and follow-up as an outpatient.  Consults: None  Discharge Exam: Blood pressure 113/65, pulse 89, temperature 98.1 F (36.7 C), temperature source Oral, resp. rate 16, height 5\' 4"  (1.626 m), weight 83.9 kg, last menstrual period 07/20/1995, SpO2 94 %. General appearance: alert, appears stated age and no distress GI: Soft, minimal tenderness in right lower quadrant as expected.  Incisions clean dry and intact except for area at the umbilicus, where cautery injury was noted Intra-Op secondary to achieving hemostasis in the area.  No obvious discharge at this time.  Disposition:  Discharge disposition: 01-Home or Self Care       Discharge Instructions    Discharge patient   Complete by:  As directed    Discharge disposition:  01-Home or Self Care   Discharge patient date:  12/22/2018     Allergies as of 12/22/2018      Reactions   Boniva [ibandronic Acid] Other (See Comments)   Aching   Codeine Nausea Only   Fosamax [alendronate Sodium]    Caused aching   Lipitor [atorvastatin] Hives   Other Other (See Comments)   Aching   Penicillins Swelling   Facial swelling Did it involve swelling of the face/tongue/throat, SOB, or low BP? Yes Did it involve sudden or severe rash/hives, skin peeling, or any reaction on the inside  of your mouth or nose? No Did you need to seek medical attention at a hospital or doctor's office? No When did it last happen?within the last 10 years If all above answers are "NO", may proceed with cephalosporin use.      Medication List    STOP taking these medications   doxycycline 100 MG tablet Commonly known as:  VIBRA-TABS   fluconazole 150 MG tablet Commonly known as:  DIFLUCAN     TAKE these medications   acetaminophen 325 MG tablet Commonly known as:  Tylenol Take 2 tablets (650 mg total) by mouth every 8 (eight) hours as needed for up to 30 days for mild pain.   Calcium 600-D 600-400 MG-UNIT tablet Generic drug:  Calcium Carbonate-Vitamin D Take 1 tablet by mouth daily.   COMBIVENT IN Inhale 1 puff into the lungs daily as needed (shortness of breath). PT STATES SHE HAS NOT USED IN A VERY LONG TIME   docusate sodium 100 MG capsule Commonly known as:  Colace Take 1 capsule (100 mg total) by mouth 2 (two) times daily as needed for up to 10 days for mild constipation.   HYDROcodone-acetaminophen 5-325 MG tablet Commonly known as:  Norco Take 1 tablet by mouth every 6 (six) hours as needed for up to 3 days for moderate pain.   ibuprofen 800 MG tablet Commonly known as:  ADVIL Take 1 tablet (800 mg total) by mouth every 8 (eight) hours as needed for mild pain or moderate pain.   omeprazole 20 MG capsule Commonly known  as:  PRILOSEC TAKE 1 CAPSULE BY MOUTH ONCE DAILY What changed:  when to take this   PROBIOTIC DAILY PO Take 1 capsule by mouth daily.   Prolia 60 MG/ML Sosy injection Generic drug:  denosumab Inject 60 mg into the skin every 6 (six) months.   rosuvastatin 5 MG tablet Commonly known as:  CRESTOR TAKE 1 TABLET BY MOUTH ONCE DAILY What changed:  when to take this   Vitamin D 50 MCG (2000 UT) tablet Take 2,000 Units by mouth daily.      Follow-up Information    Lysle Pearl, Diron Haddon, DO Follow up in 2 week(s).   Specialty:  Surgery Contact  information: [place] [phone]            Total time spent arranging discharge was >32min. Signed: Benjamine Sprague 12/22/2018, 10:11 AM

## 2018-12-22 NOTE — Discharge Instructions (Signed)
Open Appendectomy, Care After This sheet gives you information about how to care for yourself after your procedure. Your health care provider may also give you more specific instructions. If you have problems or questions, contact your health care provider. What can I expect after the procedure? After your procedure, it is common to have the following:  Pain in your abdomen, especially in the incision areas. You will be given medicine to control the pain.  Tiredness. This is a normal part of the recovery process. Your energy level will return to normal over the next several weeks.  Changes in your bowel movements, such as constipation or needing to go more often. Talk with your health care provider about how to manage this. Follow these instructions at home: Medicines   tylenol and advil as needed for discomfort.  Please alternate between the two every four hours as needed for pain.     Use narcotics, if prescribed, only when tylenol and motrin is not enough to control pain.   325-650mg  every 8hrs to max of 4000mg /24hrs (including the 325mg  in every norco dose) for the tylenol.     Advil up to 800mg  per dose every 8hrs as needed for pain.    Do not drive or use heavy machinery while taking prescription pain medicine.  Do not drink alcohol while taking prescription pain medicine.  If you were prescribed an antibiotic medicine, use it as told by your health care provider. Do not stop using the antibiotic even if you start to feel better. Incision care     Follow instructions from your health care provider about how to take care of your incision areas. Make sure you: ? Keep your incisions clean and dry. ? Wash your hands with soap and water before and after applying medicine to the areas, and before and after changing your bandage (dressing). If soap and water are not available, use hand sanitizer. ? Change your dressing as told by your health care provider. ? Leave stitches (sutures),  skin glue, or adhesive strips in place. These skin closures may need to stay in place for 2 weeks or longer. If adhesive strip edges start to loosen and curl up, you may trim the loose edges. Do not remove adhesive strips completely unless your health care provider tells you to do that.  Do not wear tight clothing over the incisions. Tight clothing may rub and irritate the incision areas, which may cause the incisions to open.  Do not take baths, swim, or use a hot tub until your health care provider approves. OK TO SHOWER.    Check your incision area every day for signs of infection. Check for: ? More redness, swelling, or pain. ? More fluid or blood. ? Warmth. ? Pus or a bad smell. Activity  Avoid lifting anything that is heavier than 10 lb (4.5 kg) for 2 weeks or until your health care provider says it is okay.  You may resume normal activities as told by your health care provider. Ask your health care provider what activities are safe for you.  Take rest breaks during the day as needed. Eating and drinking  Follow instructions from your health care provider about what you can eat after surgery.  To prevent or treat constipation while you are taking prescription pain medicine, your health care provider may recommend that you: ? Drink enough fluid to keep your urine clear or pale yellow. ? Take over-the-counter or prescription medicines. ? Eat foods that are high in fiber,  such as fresh fruits and vegetables, whole grains, and beans. ? Limit foods that are high in fat and processed sugars, such as fried and sweet foods. General instructions  Ask your health care provider when you will need an appointment to get your sutures or staples removed.  Keep all follow-up visits as told by your health care provider. This is important. Contact a health care provider if:  You have more redness, swelling, or pain around your incisions.  You have more fluid or blood coming from the  incisions.  Your incisions feel warm to the touch.  You have pus or a bad smell coming from your incisions or your dressing.  You have a fever.  You have an incision that breaks open (edges not staying together) after sutures or staples have been removed. Get help right away if:  You develop a rash.  You have chest pain or difficulty breathing.  You have pain or swelling in your legs.  You feel light-headed or you faint.  Your abdomen swells (becomes distended).  You have nausea or vomiting.  You have blood in your stool (feces). This information is not intended to replace advice given to you by your health care provider. Make sure you discuss any questions you have with your health care provider. Document Released: 01/17/2005 Document Revised: 03/19/2018 Document Reviewed: 03/31/2016 Elsevier Interactive Patient Education  2019 [employer].

## 2018-12-22 NOTE — Progress Notes (Signed)
Alexis Reed to be D/C'd home per MD order.  Discussed prescriptions and follow up appointments with the patient. Prescriptions given to patient, medication list explained in detail. Pt verbalized understanding.  Allergies as of 12/22/2018      Reactions   Boniva [ibandronic Acid] Other (See Comments)   Aching   Codeine Nausea Only   Fosamax [alendronate Sodium]    Caused aching   Lipitor [atorvastatin] Hives   Other Other (See Comments)   Aching   Penicillins Swelling   Facial swelling Did it involve swelling of the face/tongue/throat, SOB, or low BP? Yes Did it involve sudden or severe rash/hives, skin peeling, or any reaction on the inside of your mouth or nose? No Did you need to seek medical attention at a hospital or doctor's office? No When did it last happen?within the last 10 years If all above answers are "NO", may proceed with cephalosporin use.      Medication List    STOP taking these medications   doxycycline 100 MG tablet Commonly known as:  VIBRA-TABS   fluconazole 150 MG tablet Commonly known as:  DIFLUCAN     TAKE these medications   acetaminophen 325 MG tablet Commonly known as:  Tylenol Take 2 tablets (650 mg total) by mouth every 8 (eight) hours as needed for up to 30 days for mild pain.   Calcium 600-D 600-400 MG-UNIT tablet Generic drug:  Calcium Carbonate-Vitamin D Take 1 tablet by mouth daily.   COMBIVENT IN Inhale 1 puff into the lungs daily as needed (shortness of breath). PT STATES SHE HAS NOT USED IN A VERY LONG TIME   docusate sodium 100 MG capsule Commonly known as:  Colace Take 1 capsule (100 mg total) by mouth 2 (two) times daily as needed for up to 10 days for mild constipation.   HYDROcodone-acetaminophen 5-325 MG tablet Commonly known as:  Norco Take 1 tablet by mouth every 6 (six) hours as needed for up to 3 days for moderate pain.   ibuprofen 800 MG tablet Commonly known as:  ADVIL Take 1 tablet (800 mg total) by  mouth every 8 (eight) hours as needed for mild pain or moderate pain.   omeprazole 20 MG capsule Commonly known as:  PRILOSEC TAKE 1 CAPSULE BY MOUTH ONCE DAILY What changed:  when to take this   PROBIOTIC DAILY PO Take 1 capsule by mouth daily.   Prolia 60 MG/ML Sosy injection Generic drug:  denosumab Inject 60 mg into the skin every 6 (six) months.   rosuvastatin 5 MG tablet Commonly known as:  CRESTOR TAKE 1 TABLET BY MOUTH ONCE DAILY What changed:  when to take this   Vitamin D 50 MCG (2000 UT) tablet Take 2,000 Units by mouth daily.       Vitals:   12/21/18 2047 12/22/18 0405  BP: (!) 105/58 113/65  Pulse: (!) 105 89  Resp: 20 16  Temp: 98.7 F (37.1 C) 98.1 F (36.7 C)  SpO2: 94% 94%    Skin clean, dry and intact without evidence of skin break down, no evidence of skin tears noted. IV catheter discontinued intact. Site without signs and symptoms of complications. Dressing and pressure applied. Pt denies pain at this time. No complaints noted.  An After Visit Summary was printed and given to the patient. Patient escorted via [place], and D/C home via private auto.  Coulter Oldaker A Jassiel Flye

## 2019-01-03 ENCOUNTER — Encounter: Payer: Self-pay | Admitting: Internal Medicine

## 2019-01-03 DIAGNOSIS — M7989 Other specified soft tissue disorders: Secondary | ICD-10-CM | POA: Diagnosis not present

## 2019-01-04 ENCOUNTER — Telehealth: Payer: Self-pay

## 2019-01-04 DIAGNOSIS — M810 Age-related osteoporosis without current pathological fracture: Secondary | ICD-10-CM | POA: Diagnosis not present

## 2019-01-04 NOTE — Telephone Encounter (Signed)
Copied from [place] [id number]. Topic: Appointment Scheduling - Scheduling Inquiry for Clinic >> Jan 03, 2019  4:42 PM Mathis Bud wrote: Reason for CRM: Patient needs to cancel/reschedule appt for 7/29.  Patient called after hours.  Call back # [phone]

## 2019-01-04 NOTE — Telephone Encounter (Signed)
See phone note. Appt changed

## 2019-01-04 NOTE — Telephone Encounter (Signed)
Noted  

## 2019-01-18 ENCOUNTER — Ambulatory Visit
Admission: RE | Admit: 2019-01-18 | Discharge: 2019-01-18 | Disposition: A | Discharge status: Home / Self Care | Payer: Medicare HMO | Source: Ambulatory Visit | Attending: Internal Medicine | Admitting: Internal Medicine

## 2019-01-18 ENCOUNTER — Other Ambulatory Visit: Payer: Self-pay

## 2019-01-18 DIAGNOSIS — Z1231 Encounter for screening mammogram for malignant neoplasm of breast: Secondary | ICD-10-CM | POA: Diagnosis not present

## 2019-01-18 IMAGING — MG DIGITAL SCREENING BILATERAL MAMMOGRAM WITH TOMO AND CAD
8 series · 8 of 24 positions shown · non-contrast
Comparison: Previous exam(s).

CLINICAL DATA: Screening.

EXAM:
DIGITAL SCREENING BILATERAL MAMMOGRAM WITH TOMO AND CAD

[R MLO synth-2D]
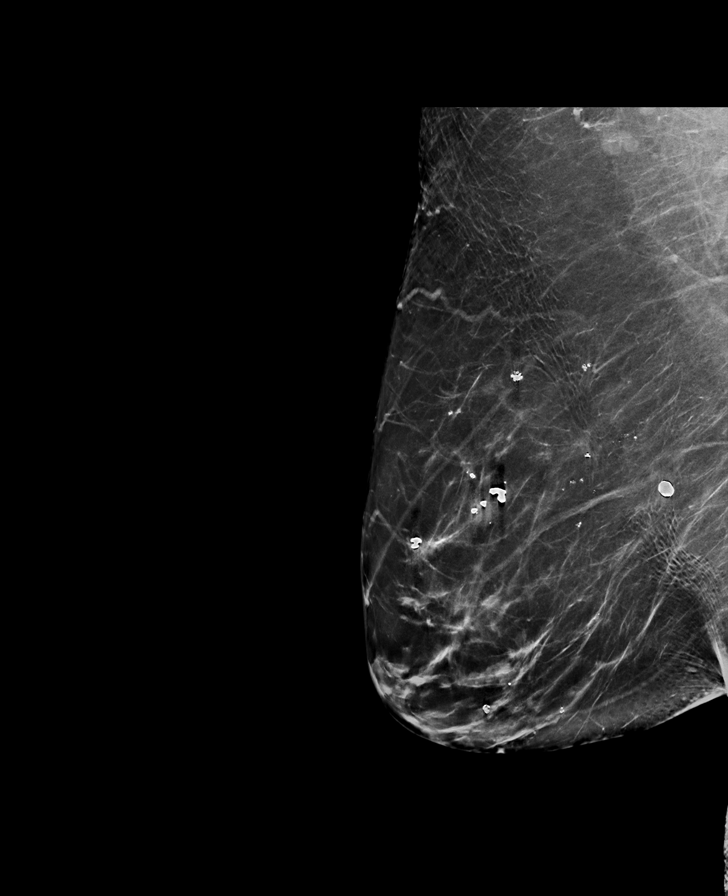

[L CC synth-2D]
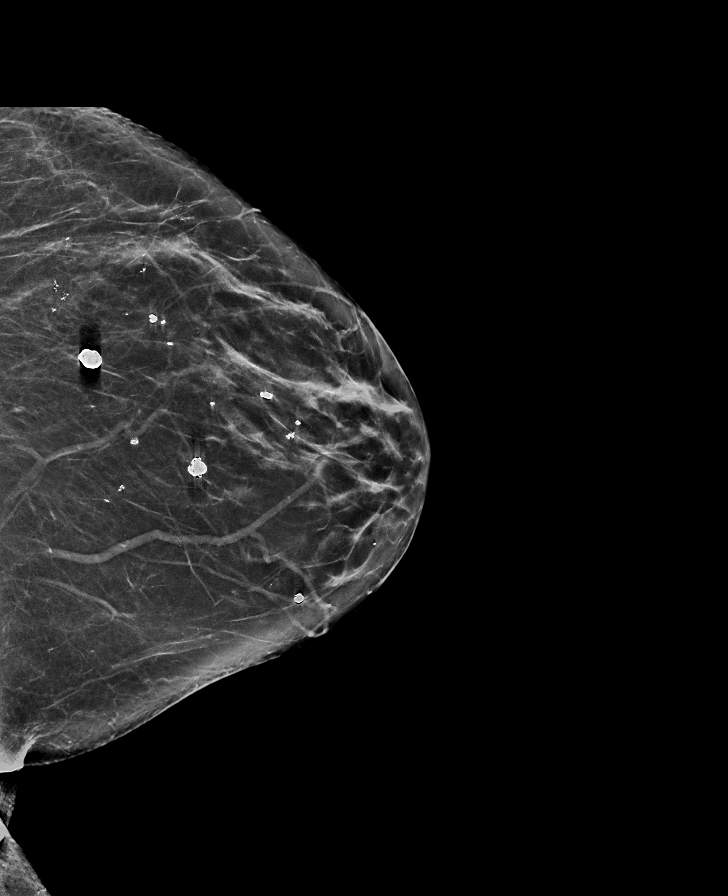

[R CC synth-2D]
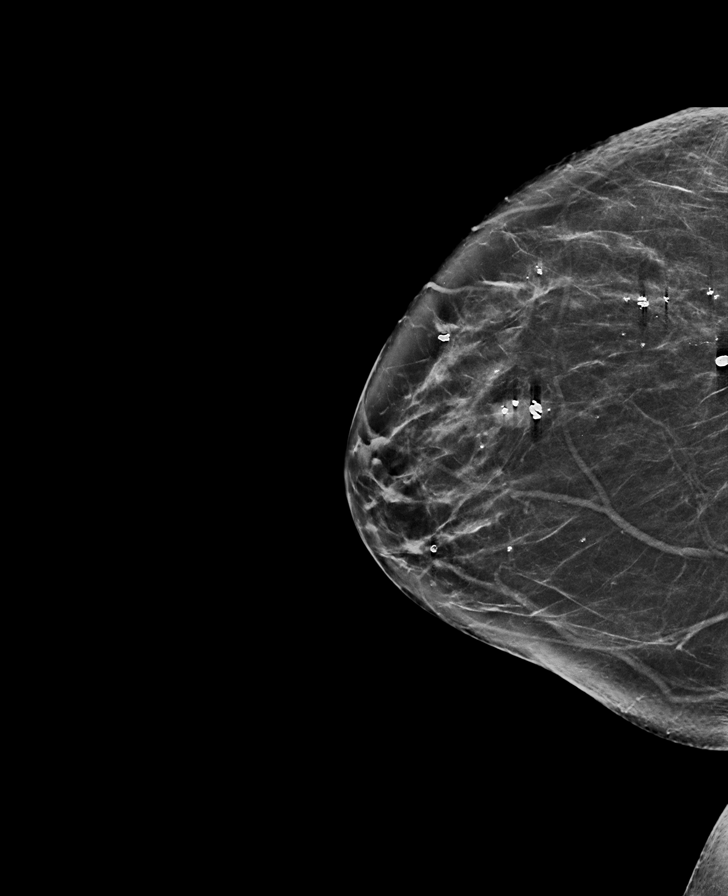

[L MLO synth-2D]
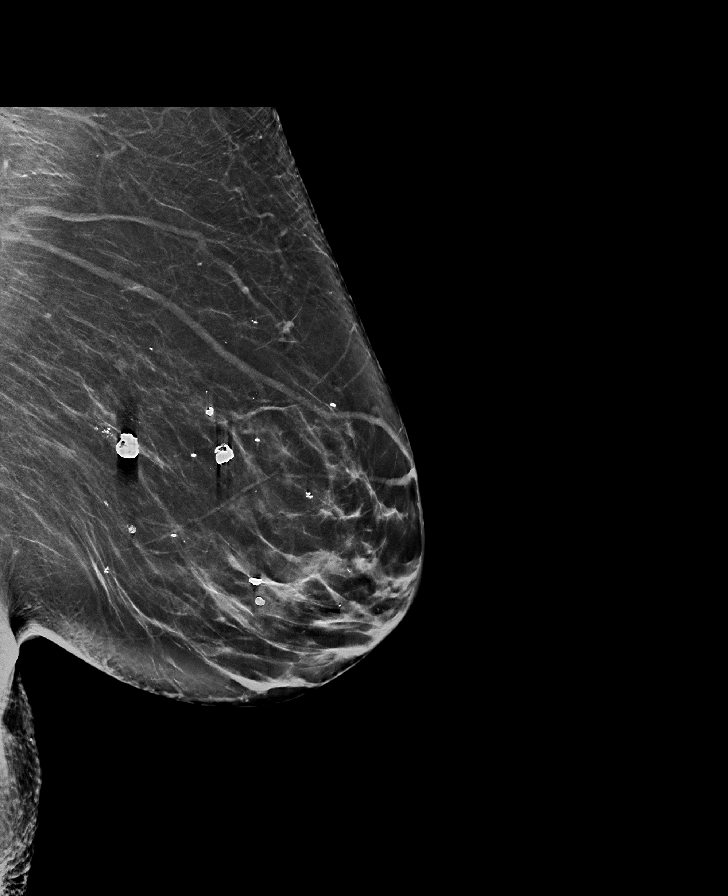

[R CC tomo · tomo slice 35/69.0]
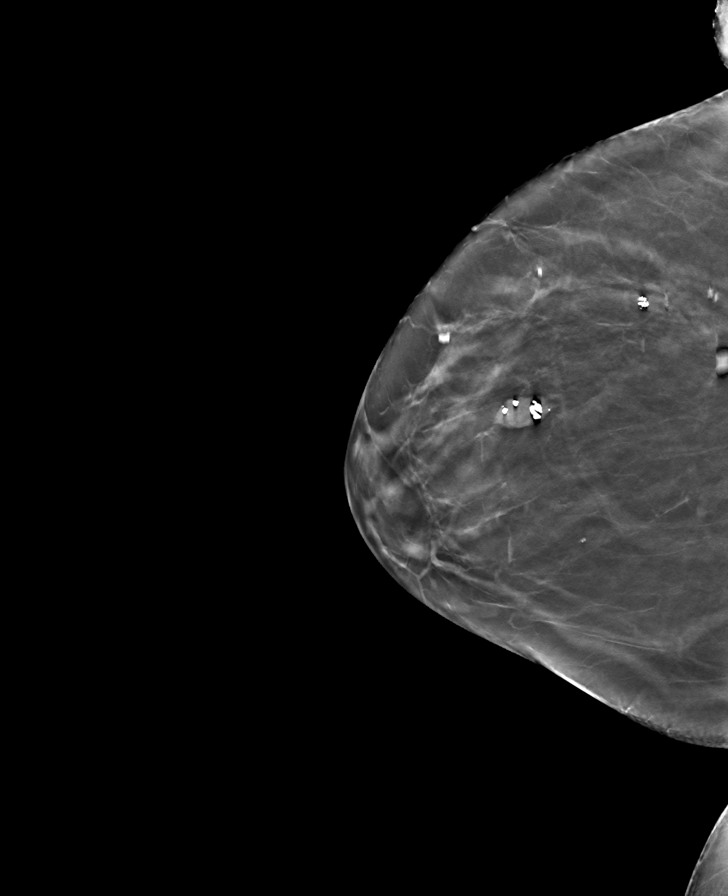

[R MLO tomo · tomo slice 45/88.0]
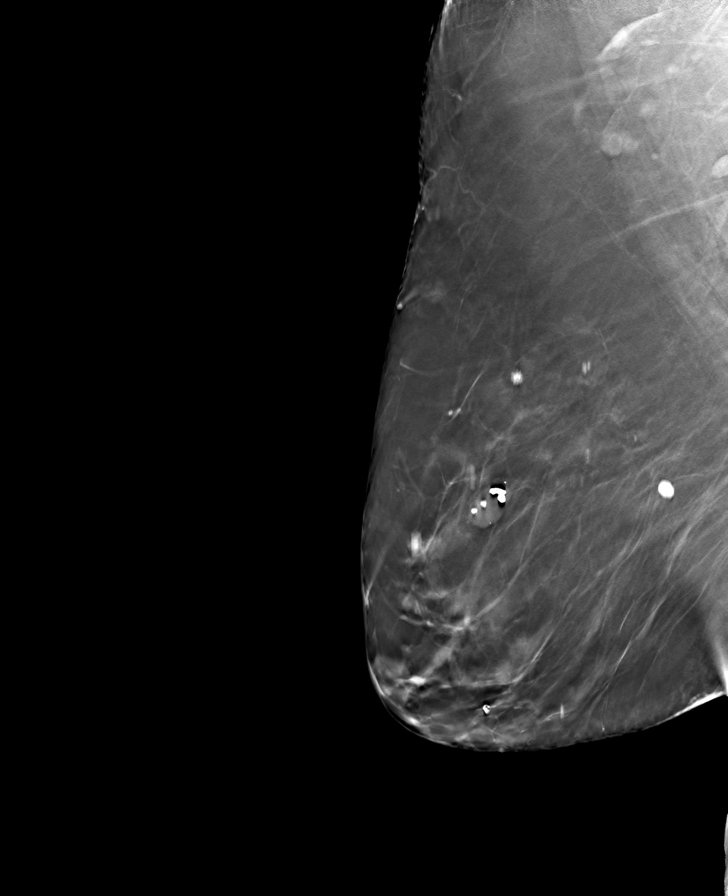

[L CC tomo · tomo slice 34/67.0]
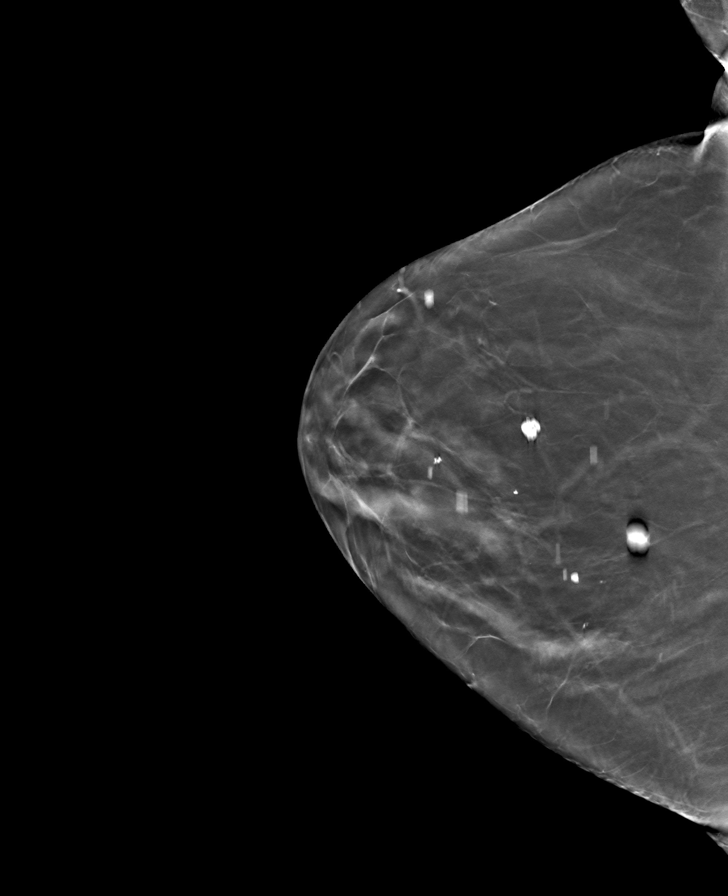

[L MLO tomo · tomo slice 41/81.0]
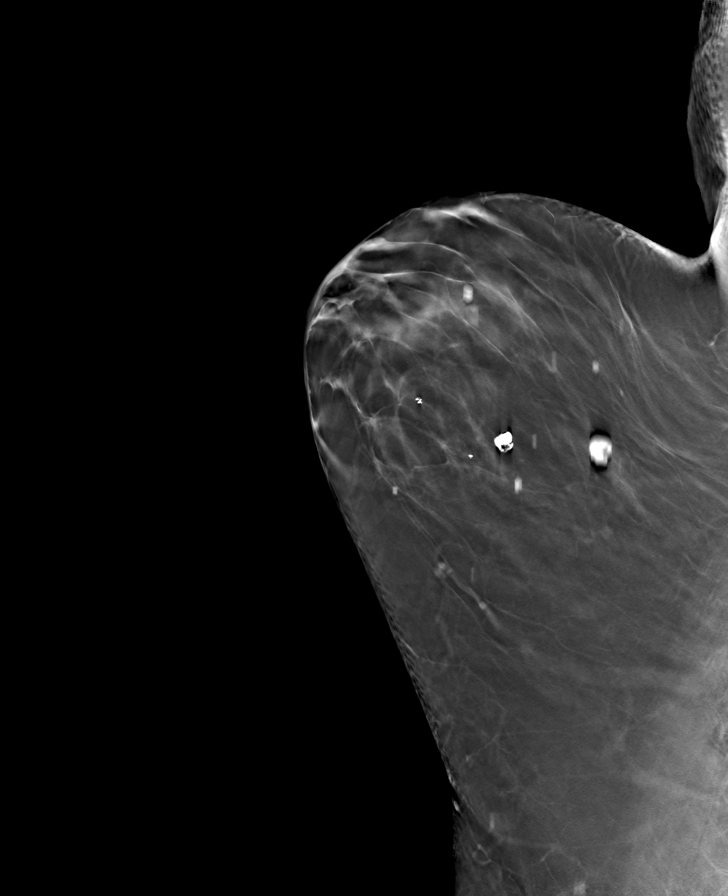

[8 of 24 positions shown; findings below may reference images not displayed]

ACR Breast Density Category b: There are scattered areas of
fibroglandular density.
FINDINGS: There are no findings suspicious for malignancy. Images were
processed with CAD.
IMPRESSION: No mammographic evidence of malignancy. A result letter of this
screening mammogram will be mailed directly to the patient.

RECOMMENDATION:
Screening mammogram in one year. (Code:[TQ])

BI-RADS CATEGORY  1: Negative.

## 2019-01-28 ENCOUNTER — Other Ambulatory Visit: Payer: Self-pay | Admitting: Surgery

## 2019-01-28 ENCOUNTER — Ambulatory Visit
Admission: RE | Admit: 2019-01-28 | Discharge: 2019-01-28 | Disposition: A | Discharge status: Home / Self Care | Payer: Medicare HMO | Source: Ambulatory Visit | Attending: Surgery | Admitting: Surgery

## 2019-01-28 ENCOUNTER — Other Ambulatory Visit: Payer: Self-pay

## 2019-01-28 DIAGNOSIS — R1030 Lower abdominal pain, unspecified: Secondary | ICD-10-CM | POA: Diagnosis not present

## 2019-01-28 DIAGNOSIS — R1033 Periumbilical pain: Secondary | ICD-10-CM | POA: Diagnosis not present

## 2019-01-28 IMAGING — CT CT ABDOMEN AND PELVIS WITH CONTRAST
2 of 5 series · 16 of 46 positions shown, 18 images · IV contrast (APPLIED)
Comparison: [DATE]

CLINICAL DATA: Lower abdominal pain for 1 month, status post
appendectomy

EXAM:
CT ABDOMEN AND PELVIS WITH CONTRAST
TECHNIQUE: Multidetector CT imaging of the abdomen and pelvis was performed
using the standard protocol following bolus administration of
intravenous contrast.
CONTRAST:  100mL OMNIPAQUE IOHEXOL 300 MG/ML SOLN, additional oral
enteric contrast

[Series 2: axial st · axial · 0.68mm/px · z∈[-486,-121]mm · 13 of 87 slices shown, 15 images]
[im 7/87  soft-tissue]
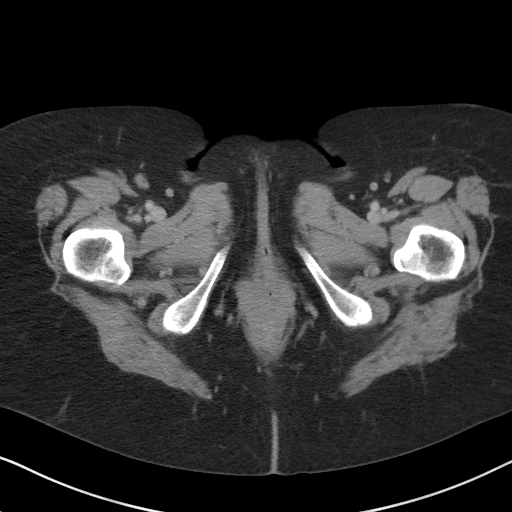
[im 7/87  bone]
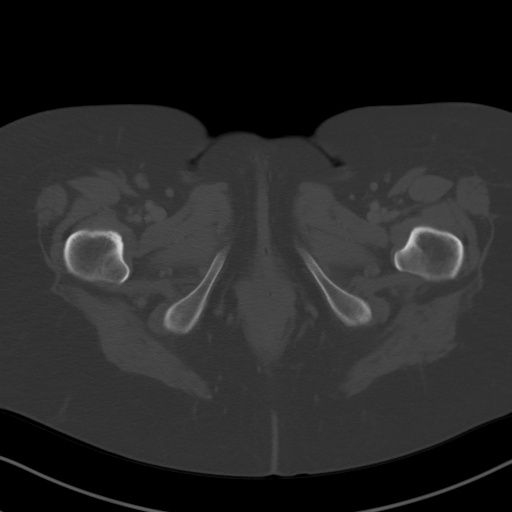
[im 13/87  soft-tissue]
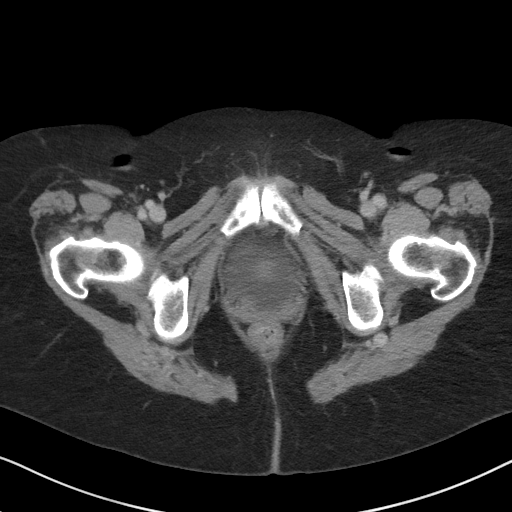
[im 19/87  soft-tissue]
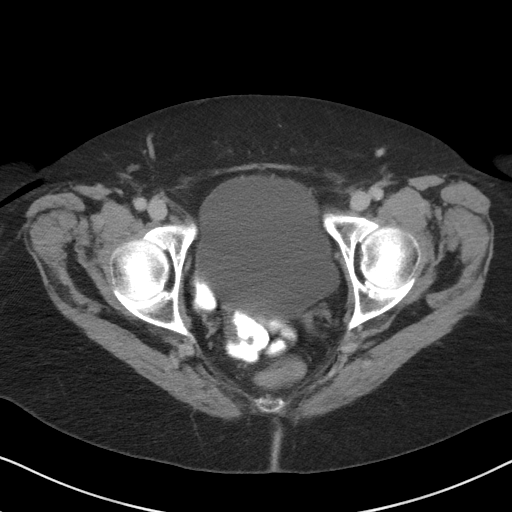
[im 25/87  soft-tissue]
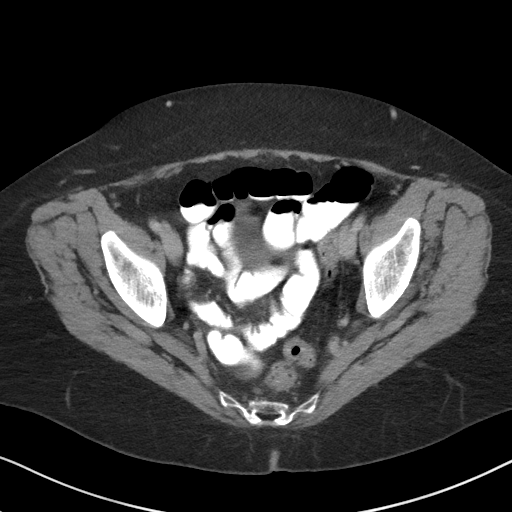
[im 31/87  soft-tissue]
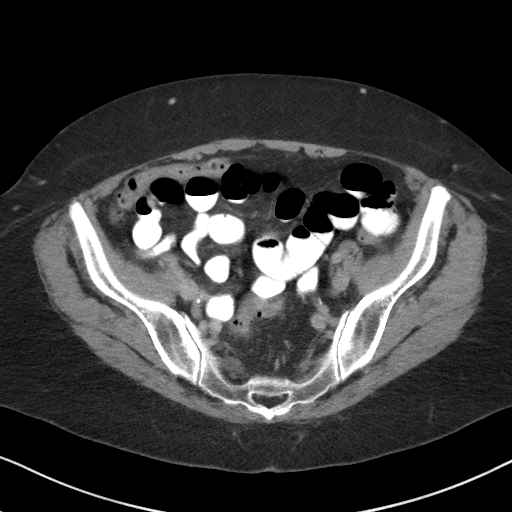
[im 37/87  soft-tissue]
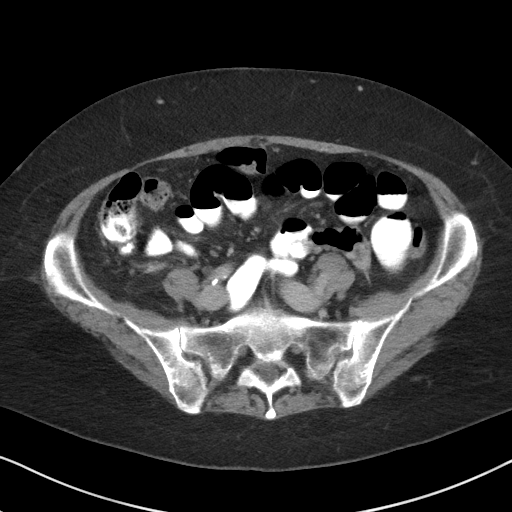
[im 44/87  soft-tissue]
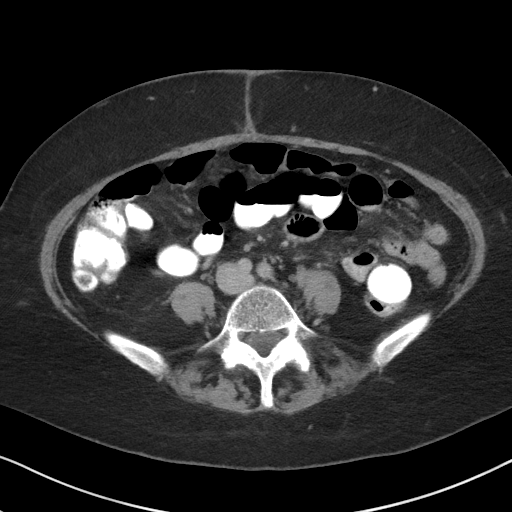
[im 50/87  soft-tissue]
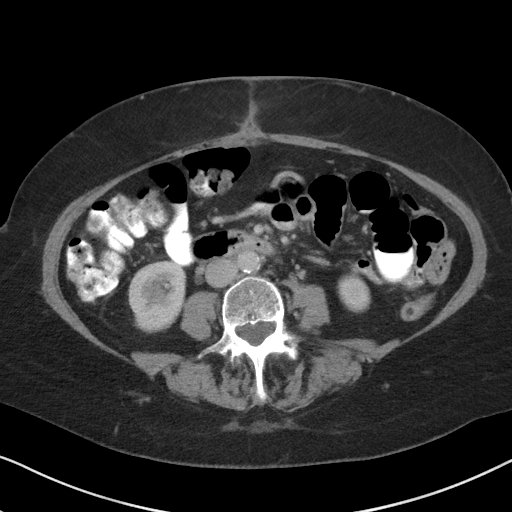
[im 56/87  soft-tissue]
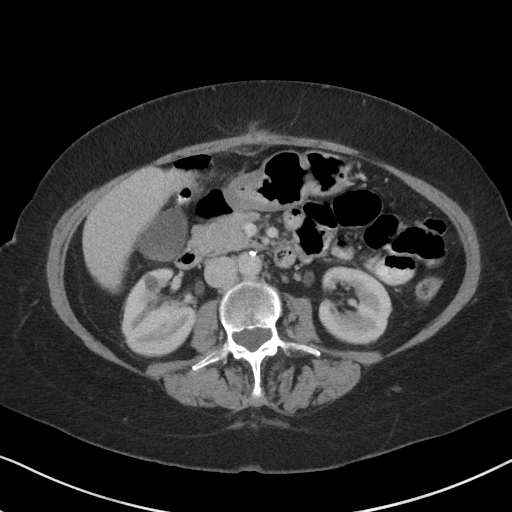
[im 56/87  bone]
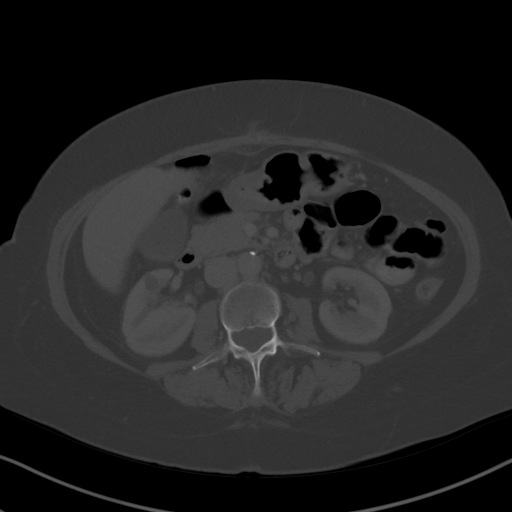
[im 62/87  soft-tissue]
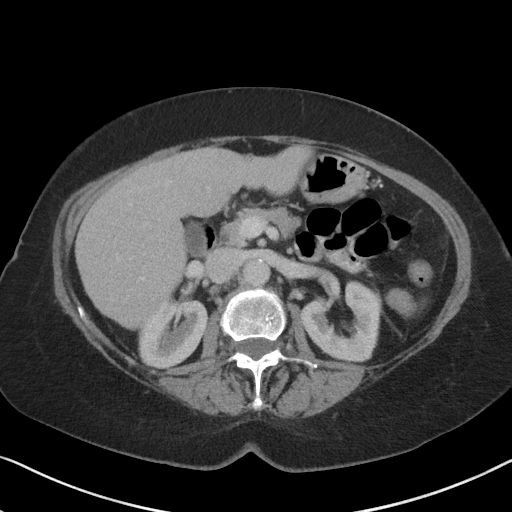
[im 68/87  soft-tissue]
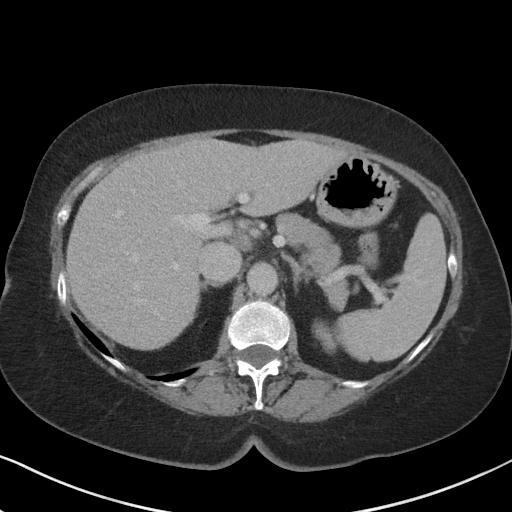
[im 74/87  soft-tissue]
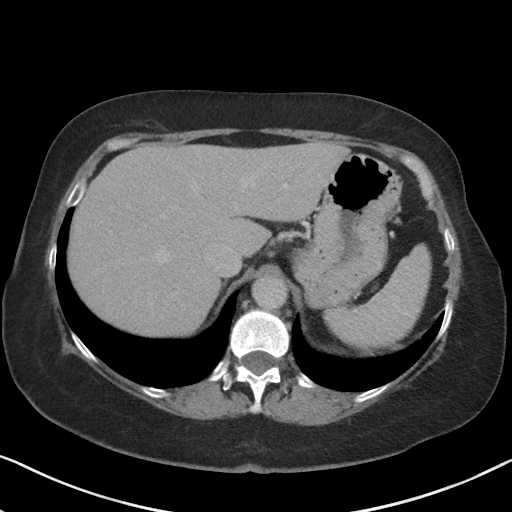
[im 80/87  soft-tissue]
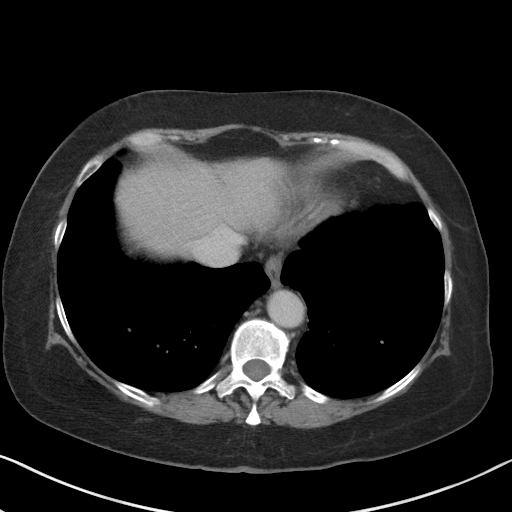

[Series 5: coronal st · coronal · 0.75mm/px · 3 of 81 slices shown]
[im 27/81  soft-tissue]
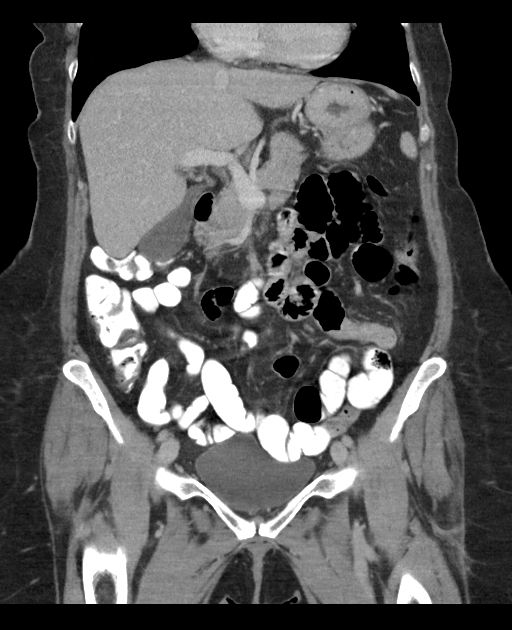
[im 36/81  soft-tissue]
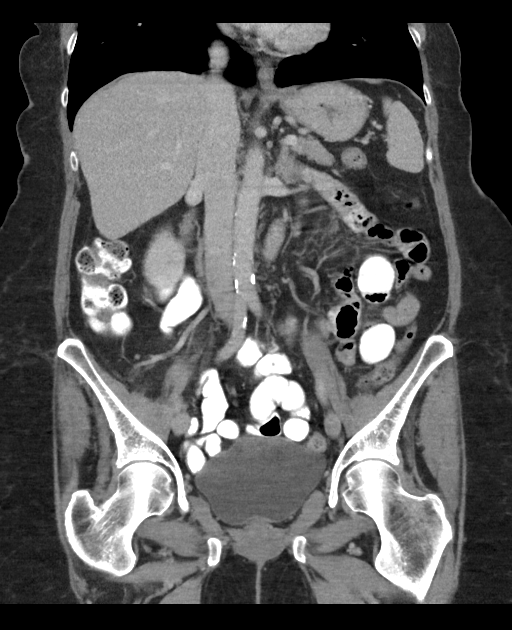
[im 45/81  soft-tissue]
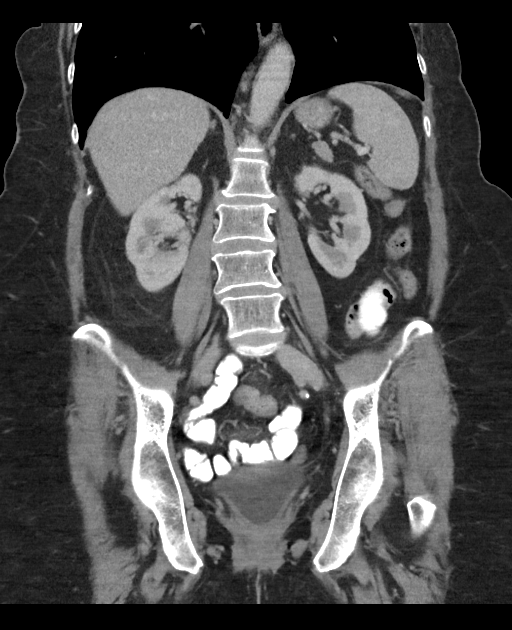

[16 of 46 positions shown; findings below may reference images not displayed]

FINDINGS: Lower chest: No acute abnormality.

Hepatobiliary: No solid liver abnormality is seen. No gallstones,
gallbladder wall thickening, or biliary dilatation.

Pancreas: Unremarkable. No pancreatic ductal dilatation or
surrounding inflammatory changes.

Spleen: Normal in size without significant abnormality.

Adrenals/Urinary Tract: Adrenal glands are unremarkable. Kidneys are
normal, without renal calculi, solid lesion, or hydronephrosis.
Bladder is unremarkable.

Stomach/Bowel: Stomach is within normal limits. Status post
appendectomy. No evidence of bowel wall thickening, distention, or
inflammatory changes.

Vascular/Lymphatic: Aortic atherosclerosis. No enlarged abdominal or
pelvic lymph nodes.

Reproductive: Status post hysterectomy.

Other: Postoperative findings of periumbilical abdominal incision.
No abdominopelvic ascites.

Musculoskeletal: No acute or significant osseous findings.
IMPRESSION: 1. Status post appendectomy with postoperative findings of
periumbilical abdominal incision. No intra-abdominal fluid
collection or other evidence of postoperative complication to
explain pain.

2. Other chronic, incidental, and postoperative findings as detailed
above.

## 2019-01-28 MED ORDER — IOHEXOL 300 MG/ML  SOLN
100.0000 mL | Freq: Once | INTRAMUSCULAR | Status: AC | PRN
  Administered 2019-01-28: 100 mL via INTRAVENOUS

## 2019-02-02 ENCOUNTER — Other Ambulatory Visit (INDEPENDENT_AMBULATORY_CARE_PROVIDER_SITE_OTHER): Payer: Medicare HMO

## 2019-02-02 ENCOUNTER — Encounter: Payer: Self-pay | Admitting: Internal Medicine

## 2019-02-02 ENCOUNTER — Other Ambulatory Visit: Payer: Self-pay

## 2019-02-02 DIAGNOSIS — E7800 Pure hypercholesterolemia, unspecified: Secondary | ICD-10-CM | POA: Diagnosis not present

## 2019-02-02 DIAGNOSIS — R109 Unspecified abdominal pain: Secondary | ICD-10-CM | POA: Diagnosis not present

## 2019-02-02 LAB — HEPATIC FUNCTION PANEL
ALT: 13 U/L (ref 0–35)
AST: 15 U/L (ref 0–37)
Albumin: 4.3 g/dL (ref 3.5–5.2)
Alkaline Phosphatase: 71 U/L (ref 39–117)
Bilirubin, Direct: 0.1 mg/dL (ref 0.0–0.3)
Total Bilirubin: 0.6 mg/dL (ref 0.2–1.2)
Total Protein: 6.6 g/dL (ref 6.0–8.3)

## 2019-02-02 LAB — CBC WITH DIFFERENTIAL/PLATELET
Basophils Absolute: 0 10*3/uL (ref 0.0–0.1)
Basophils Relative: 0.8 % (ref 0.0–3.0)
Eosinophils Absolute: 0.2 10*3/uL (ref 0.0–0.7)
Eosinophils Relative: 3.3 % (ref 0.0–5.0)
HCT: 42.3 % (ref 36.0–46.0)
Hemoglobin: 13.8 g/dL (ref 12.0–15.0)
Lymphocytes Relative: 24 % (ref 12.0–46.0)
Lymphs Abs: 1.3 10*3/uL (ref 0.7–4.0)
MCHC: 32.7 g/dL (ref 30.0–36.0)
MCV: 92.7 fl (ref 78.0–100.0)
Monocytes Absolute: 0.5 10*3/uL (ref 0.1–1.0)
Monocytes Relative: 8.6 % (ref 3.0–12.0)
Neutro Abs: 3.4 10*3/uL (ref 1.4–7.7)
Neutrophils Relative %: 63.3 % (ref 43.0–77.0)
Platelets: 279 10*3/uL (ref 150.0–400.0)
RBC: 4.56 Mil/uL (ref 3.87–5.11)
RDW: 13.6 % (ref 11.5–15.5)
WBC: 5.3 10*3/uL (ref 4.0–10.5)

## 2019-02-02 LAB — TSH: TSH: 1.83 u[IU]/mL (ref 0.35–4.50)

## 2019-02-02 LAB — LIPID PANEL
Cholesterol: 183 mg/dL (ref 0–200)
HDL: 57.8 mg/dL (ref >39.00)
LDL Cholesterol: 101 mg/dL — ABNORMAL HIGH (ref 0–99)
NonHDL: 125.68
Total CHOL/HDL Ratio: 3
Triglycerides: 122 mg/dL (ref 0.0–149.0)
VLDL: 24.4 mg/dL (ref 0.0–40.0)

## 2019-02-02 LAB — BASIC METABOLIC PANEL
BUN: 11 mg/dL (ref 6–23)
CO2: 28 mEq/L (ref 19–32)
Calcium: 9.5 mg/dL (ref 8.4–10.5)
Chloride: 104 mEq/L (ref 96–112)
Creatinine, Ser: 0.6 mg/dL (ref 0.40–1.20)
GFR: 98.57 mL/min (ref >60.00)
Glucose, Bld: 94 mg/dL (ref 70–99)
Potassium: 4.2 mEq/L (ref 3.5–5.1)
Sodium: 140 mEq/L (ref 135–145)

## 2019-02-09 ENCOUNTER — Other Ambulatory Visit: Payer: Medicare HMO

## 2019-02-16 ENCOUNTER — Other Ambulatory Visit: Payer: Self-pay

## 2019-02-16 ENCOUNTER — Ambulatory Visit (INDEPENDENT_AMBULATORY_CARE_PROVIDER_SITE_OTHER): Payer: Medicare HMO | Admitting: Internal Medicine

## 2019-02-16 DIAGNOSIS — J438 Other emphysema: Secondary | ICD-10-CM

## 2019-02-16 DIAGNOSIS — E7800 Pure hypercholesterolemia, unspecified: Secondary | ICD-10-CM

## 2019-02-16 DIAGNOSIS — K219 Gastro-esophageal reflux disease without esophagitis: Secondary | ICD-10-CM | POA: Diagnosis not present

## 2019-02-16 DIAGNOSIS — M810 Age-related osteoporosis without current pathological fracture: Secondary | ICD-10-CM | POA: Diagnosis not present

## 2019-02-16 DIAGNOSIS — R69 Illness, unspecified: Secondary | ICD-10-CM | POA: Diagnosis not present

## 2019-02-16 DIAGNOSIS — F439 Reaction to severe stress, unspecified: Secondary | ICD-10-CM

## 2019-02-16 NOTE — Progress Notes (Signed)
Patient ID: Alexis Reed, female   DOB: 03-07-1946, 73 y.o.   MRN: [id number]   Virtual Visit via video Note  This visit type was conducted due to national recommendations for restrictions regarding the COVID-19 pandemic (e.g. social distancing).  This format is felt to be most appropriate for this patient at this time.  All issues noted in this document were discussed and addressed.  No physical exam was performed (except for noted visual exam findings with Video Visits).   I connected with Alexis Reed by a video enabled telemedicine application or telephone and verified that I am speaking with the correct person using two identifiers. Location patient: home Location provider: work  Persons participating in the virtual visit: patient, provider  I discussed the limitations, risks, security and privacy concerns of performing an evaluation and management service by video and the availability of in person appointments.The patient expressed understanding and agreed to proceed.   Reason for visit: scheduled follow up.    HPI: She reports she is doing relatively well.  Tries to stay active.  No chest pain.  No sob.  No acid reflux.  No abdominal pain.  Bowels moving.  No urine change.  Is s/p open appendectomy.  Recovered well from surgery.  Saw Dr Mortimer Fries 10/13/18.  CT chest - stable - recommended f/u in 6 months.  On crestor and tolerating.  Receiving prolia - 12/2018.  Handling stress.     ROS: See pertinent positives and negatives per HPI.  Past Medical History:  Diagnosis Date  . Arthritis   . Benign breast lumps    multiple lumps biopsy and remove x's 5 last being 1979 by Dr. Sharlet Salina  . COPD (chronic obstructive pulmonary disease) ([facility])    MILD-RARELY EVER HAS TO USE COMBIVENT INHALER  . Fibroids    s/p hysterectomy  . GERD (gastroesophageal reflux disease)   . Hypercholesterolemia   . Osteoporosis   . Personal history of tobacco use, presenting hazards to health 08/07/2015     Past Surgical History:  Procedure Laterality Date  . ABDOMINAL HYSTERECTOMY  1997   with bilateral oophorectomy  . BREAST BIOPSY Right 1975   multiple biopsies done  . BREAST BIOPSY Left    multiple done  . BREAST BIOPSY Right 2016   stereo  . BREAST SURGERY Left    lump removed. Unsure of date  . BREAST SURGERY Right    lump removed. Unsure of date  . COLONOSCOPY  2010   Dr. Vira Agar  . LAPAROSCOPIC APPENDECTOMY N/A 12/21/2018   Procedure: APPENDECTOMY LAPAROSCOPIC CONVERTED TO OPEN;  Surgeon: Benjamine Sprague, DO;  Location: [facility] ORS;  Service: General;  Laterality: N/A;  . TONSILLECTOMY      Family History  Problem Relation Age of Onset  . Colon cancer Father   . Stroke Father   . Heart disease Father        myocardial infarction  . Breast cancer Neg Hx     SOCIAL HX: reviewed.    Current Outpatient Medications:  .  Calcium Carbonate-Vitamin D (CALCIUM 600-D) 600-400 MG-UNIT per tablet, Take 1 tablet by mouth daily. , Disp: , Rfl:  .  Cholecalciferol (VITAMIN D) 2000 UNITS tablet, Take 2,000 Units by mouth daily., Disp: , Rfl:  .  denosumab (PROLIA) 60 MG/ML SOSY injection, Inject 60 mg into the skin every 6 (six) months. , Disp: , Rfl:  .  ibuprofen (ADVIL) 800 MG tablet, Take 1 tablet (800 mg total) by mouth every 8 (eight) hours  as needed for mild pain or moderate pain., Disp: 30 tablet, Rfl: 0 .  Ipratropium-Albuterol (COMBIVENT IN), Inhale 1 puff into the lungs daily as needed (shortness of breath). PT STATES SHE HAS NOT USED IN A VERY LONG TIME, Disp: , Rfl:  .  omeprazole (PRILOSEC) 20 MG capsule, TAKE 1 CAPSULE BY MOUTH ONCE DAILY (Patient taking differently: Take 20 mg by mouth every morning. ), Disp: 90 capsule, Rfl: 1 .  Probiotic Product (PROBIOTIC DAILY PO), Take 1 capsule by mouth daily., Disp: , Rfl:  .  rosuvastatin (CRESTOR) 5 MG tablet, TAKE 1 TABLET BY MOUTH ONCE DAILY (Patient taking differently: Take 5 mg by mouth every morning. ), Disp: 90 tablet, Rfl: 1   EXAM:  GENERAL: alert, oriented, appears well and in no acute distress  HEENT: atraumatic, conjunttiva clear, no obvious abnormalities on inspection of external nose and ears  NECK: normal movements of the head and neck  LUNGS: on inspection no signs of respiratory distress, breathing rate appears normal, no obvious gross SOB, gasping or wheezing  CV: no obvious cyanosis  PSYCH/NEURO: pleasant and cooperative, no obvious depression or anxiety, speech and thought processing grossly intact   ASSESSMENT AND PLAN:  Discussed the following assessment and plan:  GERD (gastroesophageal reflux disease) Controlled on current regimen.  Follow.    GOLD GRADE A COPD with Centrilobular Emphysema Saw Dr Mortimer Fries 10/13/18.  Recommended f/u in 6 months.    Hypercholesterolemia On crestor.  Low cholesterol diet and exercise.  Follow lipid panel and liver function tests.    Osteoporosis Continue prolia.  Last - 12/2018.    Stress Handling stress relatively well.  Follow.      I discussed the assessment and treatment plan with the patient. The patient was provided an opportunity to ask questions and all were answered. The patient agreed with the plan and demonstrated an understanding of the instructions.   The patient was advised to call back or seek an in-person evaluation if the symptoms worsen or if the condition fails to improve as anticipated.   Einar Pheasant, MD

## 2019-02-20 ENCOUNTER — Encounter: Payer: Self-pay | Admitting: Internal Medicine

## 2019-02-20 NOTE — Assessment & Plan Note (Signed)
Saw Dr Mortimer Fries 10/13/18.  Recommended f/u in 6 months.

## 2019-02-20 NOTE — Assessment & Plan Note (Signed)
Controlled on current regimen.  Follow.  

## 2019-02-20 NOTE — Assessment & Plan Note (Signed)
Handling stress relatively well.  Follow.

## 2019-02-20 NOTE — Assessment & Plan Note (Signed)
Continue prolia.  Last - 12/2018.

## 2019-02-20 NOTE — Assessment & Plan Note (Signed)
On crestor.  Low cholesterol diet and exercise.  Follow lipid panel and liver function tests.   

## 2019-03-01 ENCOUNTER — Other Ambulatory Visit: Payer: Self-pay | Admitting: Internal Medicine

## 2019-04-06 DIAGNOSIS — L578 Other skin changes due to chronic exposure to nonionizing radiation: Secondary | ICD-10-CM | POA: Diagnosis not present

## 2019-04-06 DIAGNOSIS — Z872 Personal history of diseases of the skin and subcutaneous tissue: Secondary | ICD-10-CM | POA: Diagnosis not present

## 2019-04-06 DIAGNOSIS — L570 Actinic keratosis: Secondary | ICD-10-CM | POA: Diagnosis not present

## 2019-04-21 ENCOUNTER — Other Ambulatory Visit: Payer: Self-pay

## 2019-04-21 ENCOUNTER — Ambulatory Visit
Admission: RE | Admit: 2019-04-21 | Discharge: 2019-04-21 | Disposition: A | Discharge status: Home / Self Care | Payer: Medicare HMO | Source: Ambulatory Visit | Attending: Internal Medicine | Admitting: Internal Medicine

## 2019-04-21 DIAGNOSIS — R918 Other nonspecific abnormal finding of lung field: Secondary | ICD-10-CM | POA: Diagnosis not present

## 2019-04-21 IMAGING — CT CT CHEST W/O CM
2 of 4 series · 15 of 36 positions shown, 18 images · non-contrast
Comparison: [DATE] screening chest CT.

CLINICAL DATA: Follow-up pulmonary nodule.  Former smoker.

EXAM:
CT CHEST WITHOUT CONTRAST
TECHNIQUE: Multidetector CT imaging of the chest was performed following the
standard protocol without IV contrast.

[Series 2: chest · axial · 0.62mm/px · z∈[-1195,-899]mm · 12 of 176 slices shown, 15 images]
[im 14/176  mediastinal]
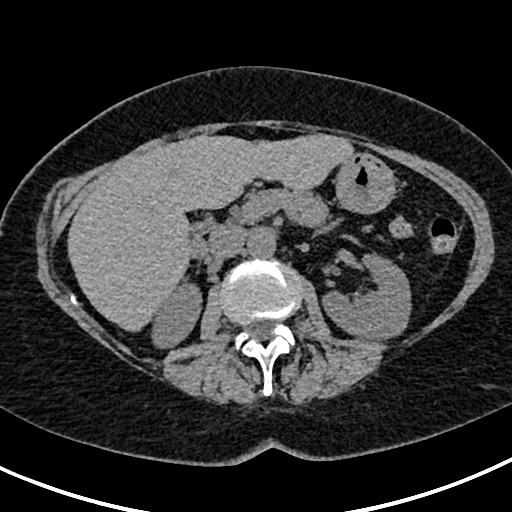
[im 14/176  lung]
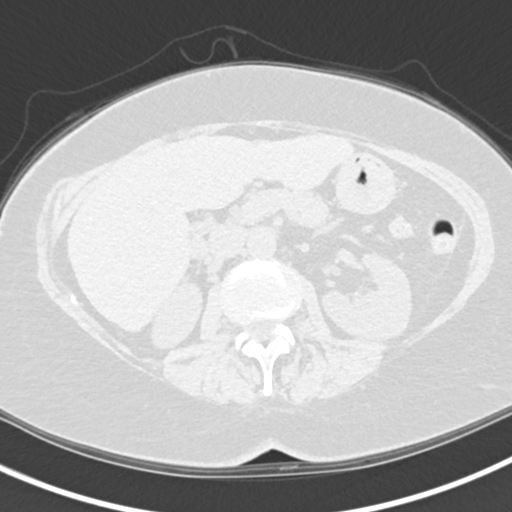
[im 27/176  lung]
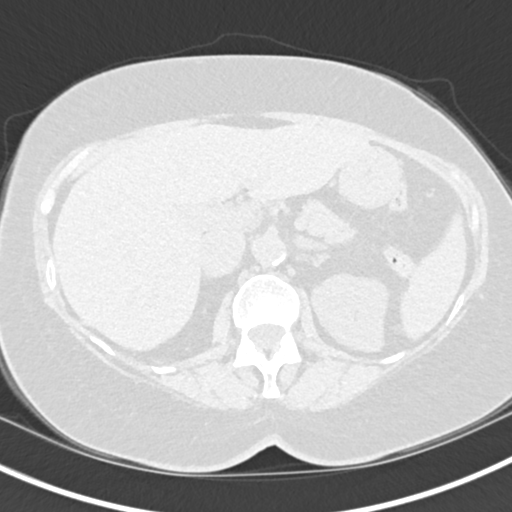
[im 41/176  lung]
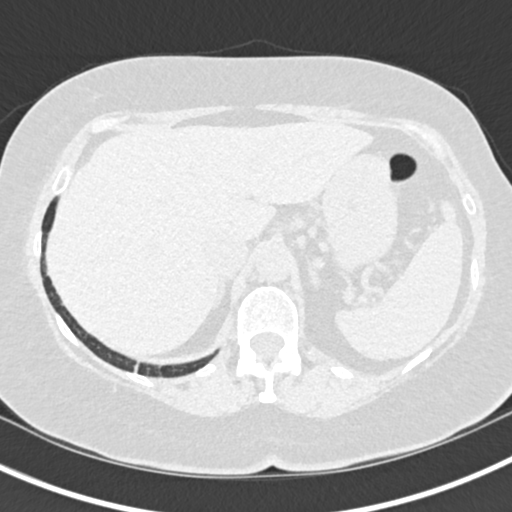
[im 54/176  lung]
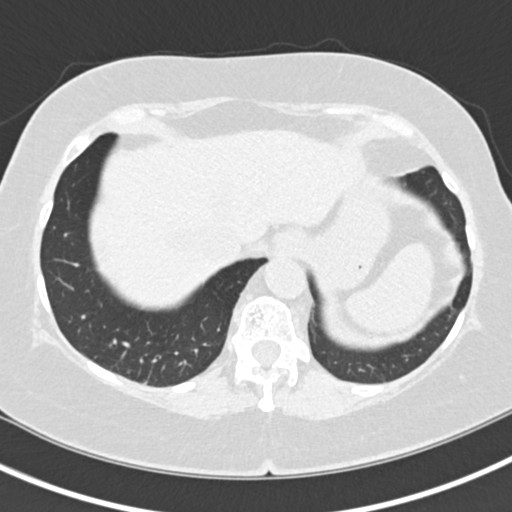
[im 68/176  mediastinal]
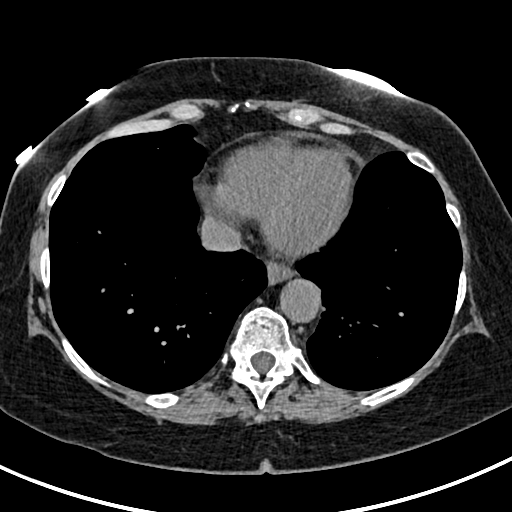
[im 68/176  lung]
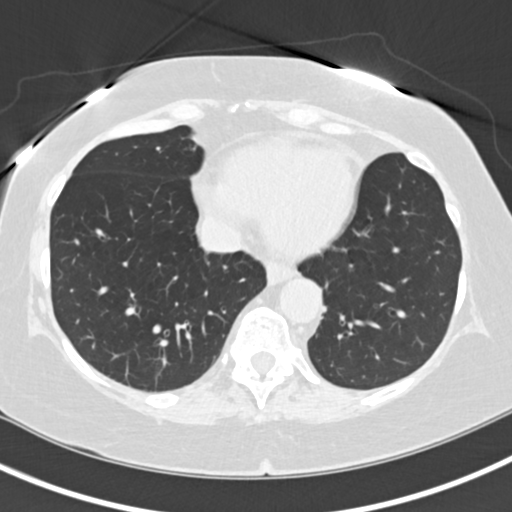
[im 81/176  lung]
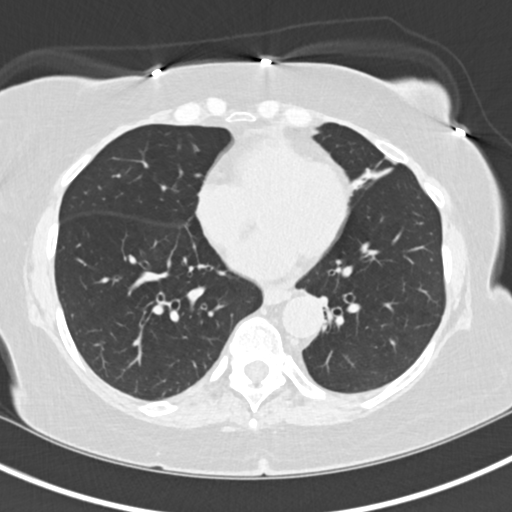
[im 95/176  lung]
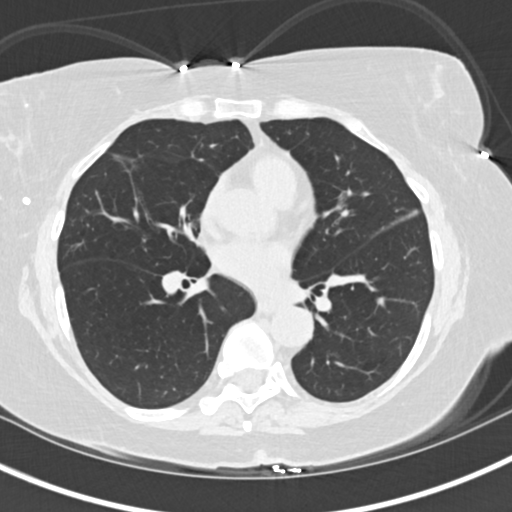
[im 108/176  lung]
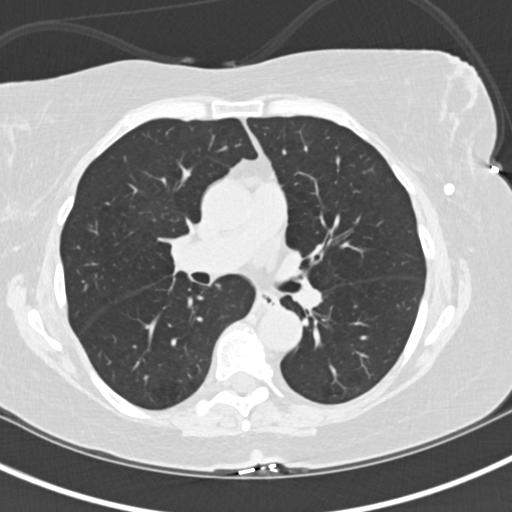
[im 122/176  mediastinal]
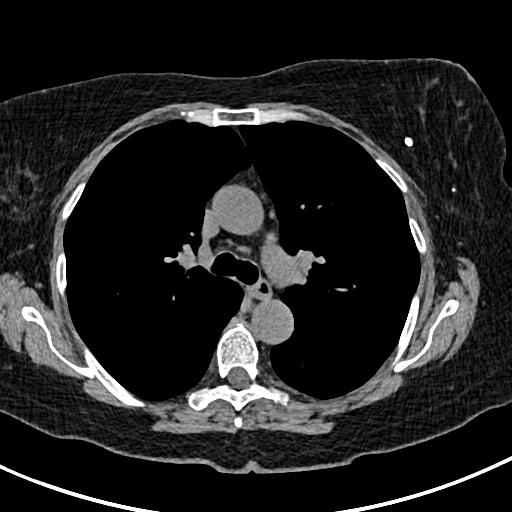
[im 122/176  lung]
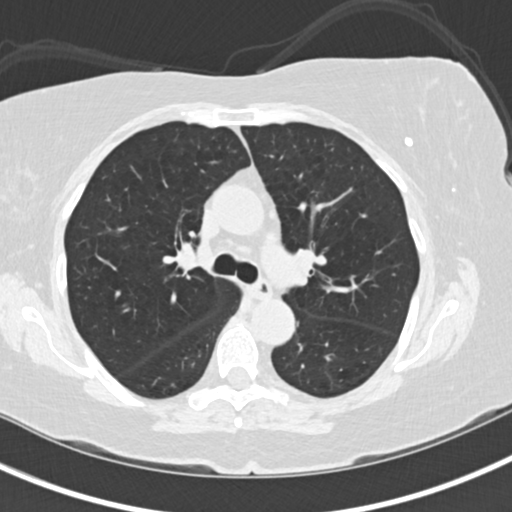
[im 135/176  lung]
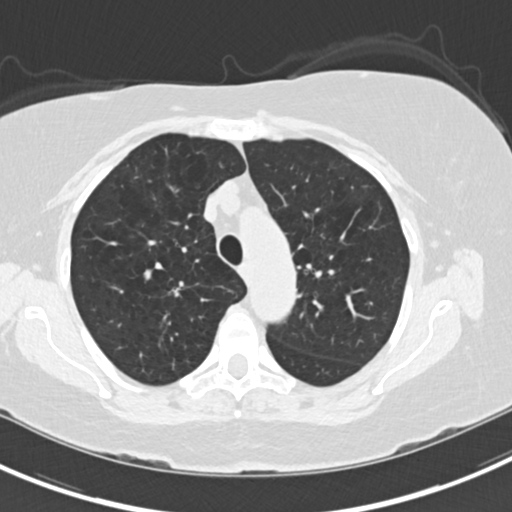
[im 149/176  lung]
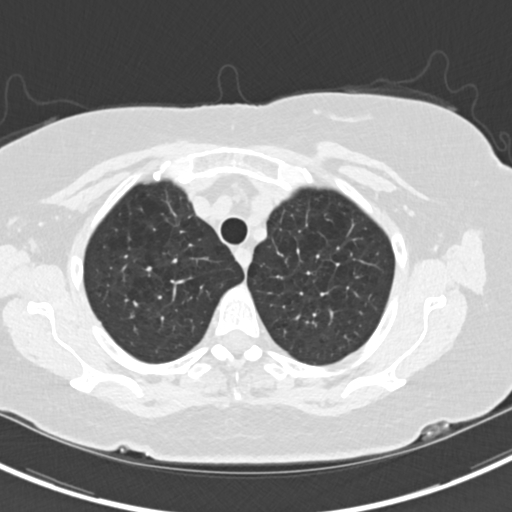
[im 162/176  lung]
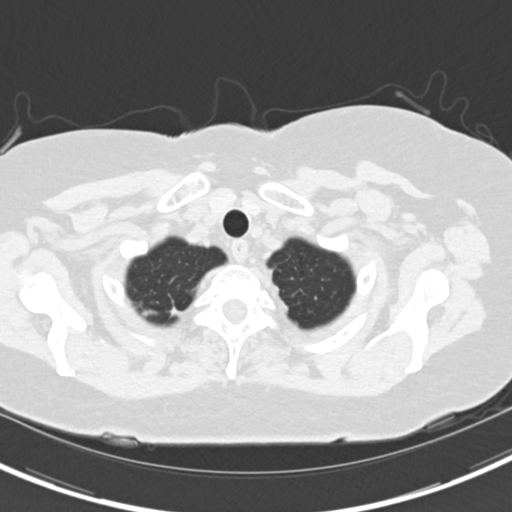

[Series 5: coronals chest · coronal · 0.62mm/px · 3 of 136 slices shown]
[im 28/136  lung]
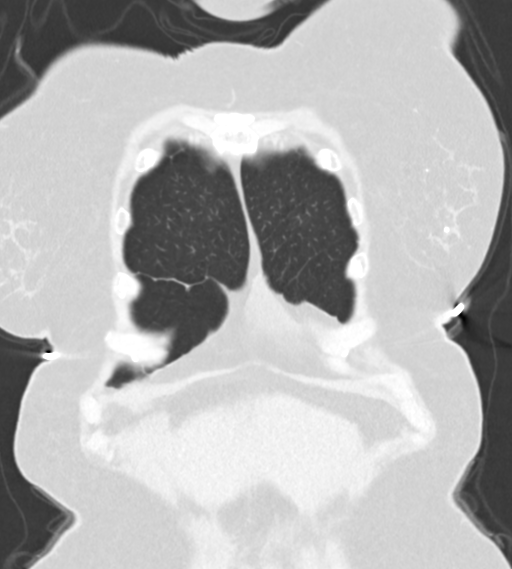
[im 55/136  lung]
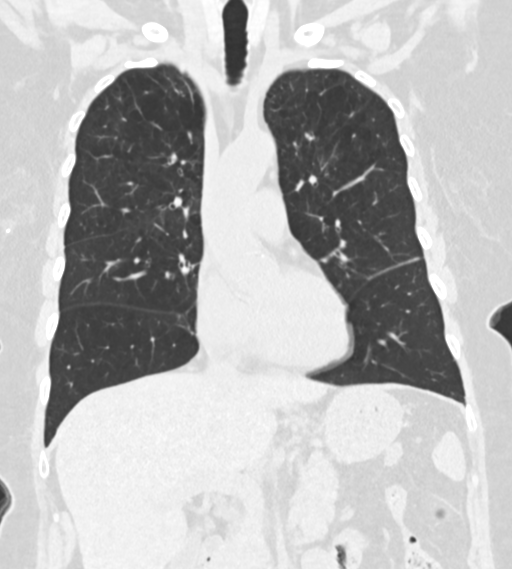
[im 82/136  lung]
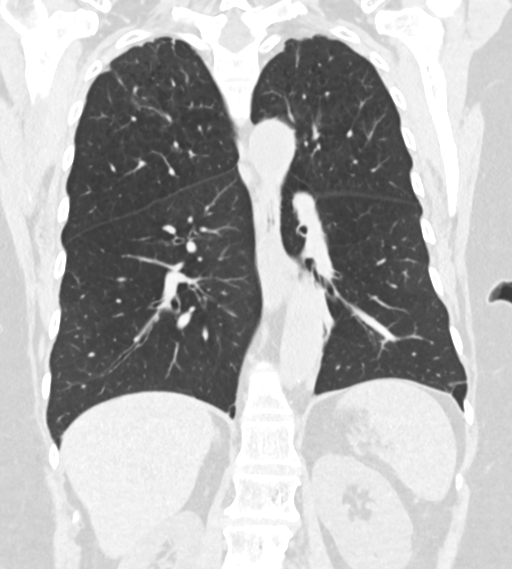

[15 of 36 positions shown; findings below may reference images not displayed]

FINDINGS: Cardiovascular: Normal heart size. No significant pericardial
effusion/thickening. Left anterior descending coronary
atherosclerosis. Atherosclerotic nonaneurysmal thoracic aorta.
Normal caliber pulmonary arteries.

Mediastinum/Nodes: Hypodense posterior right thyroid 1.7 cm nodule
is stable. Unremarkable esophagus. No pathologically enlarged
axillary, mediastinal or hilar lymph nodes, noting limited
sensitivity for the detection of hilar adenopathy on this
noncontrast study.

Lungs/Pleura: No pneumothorax. No pleural effusion. Moderate
centrilobular emphysema with mild diffuse bronchial wall thickening.
No acute consolidative airspace disease or lung masses. Previously
described 3.6 mm posterior right upper lobe pulmonary nodule is
stable (series 3/image 59), favored to represent mucoid impaction
within a mildly dilated peripheral airway. Previously described
mm pulmonary nodule in the medial right lower lobe on [DATE]
chest CT has resolved. Previously described 3.1 mm pulmonary nodule
in the medial right middle lobe is stable (series 3/image 108,
compatible with mucoid impaction within dilated peripheral airway.
Additional previously visualized punctate 1-2 mm pulmonary nodules
are all unchanged. No new significant pulmonary nodules.

Upper abdomen: No acute abnormality.

Musculoskeletal: No aggressive appearing focal osseous lesions. Mild
thoracic spondylosis.
IMPRESSION: 1. Previously visualized tiny pulmonary nodules on [DATE]
screening chest CT study are all either stable or resolved, probably
benign. No new significant pulmonary nodules. Patient is due for
follow-up screening chest CT in late [DATE].
2. One vessel coronary atherosclerosis.

Aortic Atherosclerosis ([OP]-[OP]) and Emphysema ([OP]-[OP]).

## 2019-04-28 ENCOUNTER — Other Ambulatory Visit: Payer: Self-pay | Admitting: Internal Medicine

## 2019-05-10 DIAGNOSIS — R69 Illness, unspecified: Secondary | ICD-10-CM | POA: Diagnosis not present

## 2019-06-27 DIAGNOSIS — Z20828 Contact with and (suspected) exposure to other viral communicable diseases: Secondary | ICD-10-CM | POA: Diagnosis not present

## 2019-07-07 ENCOUNTER — Encounter: Payer: Self-pay | Admitting: Emergency Medicine

## 2019-07-07 ENCOUNTER — Ambulatory Visit
Admission: EM | Admit: 2019-07-07 | Discharge: 2019-07-07 | Disposition: A | Discharge status: Home / Self Care | Payer: Medicare HMO | Attending: Family Medicine | Admitting: Family Medicine

## 2019-07-07 ENCOUNTER — Other Ambulatory Visit: Payer: Self-pay

## 2019-07-07 DIAGNOSIS — R300 Dysuria: Secondary | ICD-10-CM

## 2019-07-07 LAB — URINALYSIS, COMPLETE (UACMP) WITH MICROSCOPIC
Bacteria, UA: NONE SEEN
Bilirubin Urine: NEGATIVE
Glucose, UA: NEGATIVE mg/dL
Hgb urine dipstick: NEGATIVE
Ketones, ur: NEGATIVE mg/dL
Leukocytes,Ua: NEGATIVE
Nitrite: NEGATIVE
Protein, ur: NEGATIVE mg/dL
RBC / HPF: NONE SEEN RBC/hpf (ref 0–5)
Specific Gravity, Urine: 1.02 (ref 1.005–1.030)
WBC, UA: NONE SEEN WBC/hpf (ref 0–5)
pH: 5 (ref 5.0–8.0)

## 2019-07-07 MED ORDER — PHENAZOPYRIDINE HCL 200 MG PO TABS: 200.0000 mg | ORAL_TABLET | Freq: Three times a day (TID) | ORAL | 0 refills | Status: DC | PRN

## 2019-07-07 NOTE — Discharge Instructions (Signed)
No evidence of UTI.  Medication as needed.  Reach out to Dr. Nicki Reaper.  Take care  Dr. Lacinda Axon

## 2019-07-07 NOTE — ED Triage Notes (Signed)
Patient in today c/o dysuria since yesterday. Patient denies urinary frequency. Patient denies any vaginal discharge.

## 2019-07-07 NOTE — ED Provider Notes (Signed)
[facility]    CSN: [id number] Arrival date & time: 07/07/19  [id number]    History   Chief Complaint Chief Complaint  Patient presents with  . Dysuria   HPI  72 year old female presents with dysuria.  Patient reports that she has had burning with urination and burning in the vaginal region at rest.  Started yesterday.  Denies urinary frequency.  She does report some urgency.  Denies vaginal discharge.  No medications or interventions tried.  Patient states that she has UTIs infrequently.  She is concerned that she has UTI.  No fever.  No significant abdominal pain.  No back pain or flank pain.  No known exacerbating or relieving factors.    PMH, Surgical Hx, Family Hx, Social History reviewed and updated as below.  Past Medical History:  Diagnosis Date  . Arthritis   . Benign breast lumps    multiple lumps biopsy and remove x's 5 last being 1979 by Dr. Sharlet Salina  . COPD (chronic obstructive pulmonary disease) (HCC)    MILD-RARELY EVER HAS TO USE COMBIVENT INHALER  . Fibroids    s/p hysterectomy  . GERD (gastroesophageal reflux disease)   . Hypercholesterolemia   . Osteoporosis   . Personal history of tobacco use, presenting hazards to health 08/07/2015    Patient Active Problem List   Diagnosis Date Noted  . 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 Abdominal pain 08/22/2018  . History of colonic polyps 02/04/2016  . Personal history of tobacco use, presenting hazards to health 08/07/2015  . Health care maintenance 11/18/2014  . Breast calcifications on mammogram 10/18/2014  . Obesity 07/23/2014  . GOLD GRADE A COPD with Centrilobular Emphysema 04/26/2014  . Tobacco use disorder 04/26/2014  . Abnormal mammogram 04/12/2014  . Stress 02/25/2014  . Internal hemorrhoids without complication [id number]  . Osteoporosis 06/18/2012  . GERD (gastroesophageal reflux disease) 06/18/2012  . Hypercholesterolemia 06/18/2012    Past Surgical History:  Procedure Laterality  Date  . ABDOMINAL HYSTERECTOMY  1997   with bilateral oophorectomy  . BREAST BIOPSY Right 1975   multiple biopsies done  . BREAST BIOPSY Left    multiple done  . BREAST BIOPSY Right 2016   stereo  . BREAST SURGERY Left    lump removed. Unsure of date  . BREAST SURGERY Right    lump removed. Unsure of date  . COLONOSCOPY  2010   Dr. Vira Agar  . LAPAROSCOPIC APPENDECTOMY N/A 12/21/2018   Procedure: APPENDECTOMY LAPAROSCOPIC CONVERTED TO OPEN;  Surgeon: Benjamine Sprague, DO;  Location: [facility] ORS;  Service: General;  Laterality: N/A;  . TONSILLECTOMY      OB History    Gravida  2   Para  2   Term      Preterm      AB      Living  2     SAB      TAB      Ectopic      Multiple      Live Births           Obstetric Comments  1st Menstrual Cycle:  12 1st Pregnancy: 30         Home Medications    Prior to Admission medications   Medication Sig Start Date End Date Taking? Authorizing Provider  Calcium Carbonate-Vitamin D (CALCIUM 600-D) 600-400 MG-UNIT per tablet Take 1 tablet by mouth daily.    Yes [provider]  Cholecalciferol (VITAMIN D) 2000 UNITS tablet Take 2,000 Units by mouth  daily.   Yes [provider]  denosumab (PROLIA) 60 MG/ML SOSY injection Inject 60 mg into the skin every 6 (six) months.    Yes [provider]  ibuprofen (ADVIL) 800 MG tablet Take 1 tablet (800 mg total) by mouth every 8 (eight) hours as needed for mild pain or moderate pain. 12/22/18  Yes Sakai, Isami, DO  Ipratropium-Albuterol (COMBIVENT IN) Inhale 1 puff into the lungs daily as needed (shortness of breath). PT STATES SHE HAS NOT USED IN A VERY LONG TIME   Yes [provider]  omeprazole (PRILOSEC) 20 MG capsule TAKE 1 CAPSULE BY MOUTH ONCE DAILY 03/01/19  Yes Einar Pheasant, MD  Probiotic Product (PROBIOTIC DAILY PO) Take 1 capsule by mouth daily.   Yes [provider]  rosuvastatin (CRESTOR) 5 MG tablet Take 1 tablet (5 mg total) by mouth  every morning. 04/29/19  Yes Einar Pheasant, MD  phenazopyridine (PYRIDIUM) 200 MG tablet Take 1 tablet (200 mg total) by mouth 3 (three) times daily as needed for pain. 07/07/19   Coral Spikes, DO    Family History Family History  Problem Relation Age of Onset  . Hyperlipidemia Mother   . Thyroid disease Mother   . Hypertension Mother   . Colon cancer Father   . Stroke Father   . Heart disease Father        myocardial infarction  . Breast cancer Neg Hx     Social History Social History   Tobacco Use  . Smoking status: Former Smoker    Packs/day: 1.00    Years: 30.00    Pack years: 30.00    Types: Cigarettes    Quit date: 04/26/2004    Years since quitting: 15.2  . Smokeless tobacco: Never Used  Substance Use Topics  . Alcohol use: Yes    Alcohol/week: 0.0 standard drinks    Comment: occassional wine every other day  . Drug use: No     Allergies   Boniva [ibandronic acid], Codeine, Fosamax [alendronate sodium], Lipitor [atorvastatin], Other, and Penicillins   Review of Systems Review of Systems  Constitutional: Negative.   Gastrointestinal: Negative.   Genitourinary: Positive for dysuria.   Physical Exam Triage Vital Signs ED Triage Vitals  Enc Vitals Group     BP 07/07/19 0853 124/75     Pulse Rate 07/07/19 0853 78     Resp 07/07/19 0853 16     Temp 07/07/19 0853 98 F (36.7 C)     Temp Source 07/07/19 0853 Oral     SpO2 07/07/19 0853 99 %     Weight 07/07/19 0853 180 lb (81.6 kg)     Height 07/07/19 0853 5\' 3"  (1.6 m)     Head Circumference --      Peak Flow --      Pain Score 07/07/19 0852 3     Pain Loc --      Pain Edu? --      Excl. in [place]? --    Updated Vital Signs BP 124/75 (BP Location: Left Arm)   Pulse 78   Temp 98 F (36.7 C) (Oral)   Resp 16   Ht 5\' 3"  (1.6 m)   Wt 81.6 kg   LMP 07/20/1995   SpO2 99%   BMI 31.89 kg/m   Visual Acuity Right Eye Distance:   Left Eye Distance:   Bilateral Distance:    Right Eye Near:    Left Eye Near:    Bilateral Near:  Physical Exam Vitals and nursing note reviewed.  Constitutional:      General: She is not in acute distress.    Appearance: Normal appearance. She is obese. She is not ill-appearing.  HENT:     Head: Normocephalic and atraumatic.  Eyes:     General:        Right eye: No discharge.        Left eye: No discharge.     Conjunctiva/sclera: Conjunctivae normal.  Cardiovascular:     Rate and Rhythm: Normal rate and regular rhythm.     Heart sounds: No murmur.  Pulmonary:     Effort: Pulmonary effort is normal.     Breath sounds: Normal breath sounds. No wheezing, rhonchi or rales.  Abdominal:     General: There is no distension.     Palpations: Abdomen is soft.     Tenderness: There is no abdominal tenderness. There is no guarding or rebound.  Neurological:     Mental Status: She is alert.  Psychiatric:        Mood and Affect: Mood normal.        Behavior: Behavior normal.    UC Treatments / Results  Labs (all labs ordered are listed, but only abnormal results are displayed) Labs Reviewed  URINALYSIS, COMPLETE (UACMP) WITH MICROSCOPIC    EKG   Radiology No results found.  Procedures Procedures (including critical care time)  Medications Ordered in UC Medications - No data to display  Initial Impression / Assessment and Plan / UC Course  I have reviewed the triage vital signs and the nursing notes.  Pertinent labs & imaging results that were available during my care of the patient were reviewed by me and considered in my medical decision making (see chart for details).    73 year old female presents with dysuria.  Urinalysis normal.  No evidence of UTI.  Suspect that this is primarily due to atrophic vaginitis.  Advised to follow-up with primary care physician.  Pyridium as needed.  Supportive care.  Final Clinical Impressions(s) / UC Diagnoses   Final diagnoses:  Dysuria     Discharge Instructions     No evidence of  UTI.  Medication as needed.  Reach out to Dr. Nicki Reaper.  Take care  Dr. Lacinda Axon    ED Prescriptions    Medication Sig Dispense Auth. Provider   phenazopyridine (PYRIDIUM) 200 MG tablet Take 1 tablet (200 mg total) by mouth 3 (three) times daily as needed for pain. 6 tablet Coral Spikes, DO     PDMP not reviewed this encounter.   Thersa Salt [place], [place] 07/07/19 [id number]

## 2019-07-11 DIAGNOSIS — M810 Age-related osteoporosis without current pathological fracture: Secondary | ICD-10-CM | POA: Diagnosis not present

## 2019-08-05 ENCOUNTER — Ambulatory Visit: Payer: Medicare HMO | Attending: Internal Medicine

## 2019-08-05 DIAGNOSIS — Z23 Encounter for immunization: Secondary | ICD-10-CM

## 2019-08-05 NOTE — Progress Notes (Signed)
   Covid-19 Vaccination Clinic  Name:  Alexis Reed    MRN: [id number] DOB: 08-30-1945  08/05/2019  [name] was observed post Covid-19 immunization for 15 minutes without incidence. She was provided with Vaccine Information Sheet and instruction to access the V-Safe system.   [name] was instructed to call 911 with any severe reactions post vaccine: Marland Kitchen Difficulty breathing  . Swelling of your face and throat  . A fast heartbeat  . A bad rash all over your body  . Dizziness and weakness    Immunizations Administered    Name Date Dose VIS Date Route   Pfizer COVID-19 Vaccine 08/05/2019 11:55 AM 0.3 mL 06/24/2019 Intramuscular   Manufacturer: [place]   Lot: [id number]   [place]: [id number]

## 2019-08-26 ENCOUNTER — Ambulatory Visit: Payer: Medicare HMO | Attending: Internal Medicine

## 2019-08-26 DIAGNOSIS — Z23 Encounter for immunization: Secondary | ICD-10-CM | POA: Insufficient documentation

## 2019-08-26 NOTE — Progress Notes (Signed)
   Covid-19 Vaccination Clinic  Name:  FELISA VEGA    MRN: [id number] DOB: Feb 27, 1946  08/26/2019  Ms. Loaiza was observed post Covid-19 immunization for 15 minutes without incidence. She was provided with Vaccine Information Sheet and instruction to access the V-Safe system.   Ms. Eighmey was instructed to call 911 with any severe reactions post vaccine: Marland Kitchen Difficulty breathing  . Swelling of your face and throat  . A fast heartbeat  . A bad rash all over your body  . Dizziness and weakness    Immunizations Administered    Name Date Dose VIS Date Route   Pfizer COVID-19 Vaccine 08/26/2019  4:41 PM 0.3 mL 06/24/2019 Intramuscular   Manufacturer: [place]   Lot: [id number]   [place]: [id number]

## 2019-09-26 ENCOUNTER — Telehealth (INDEPENDENT_AMBULATORY_CARE_PROVIDER_SITE_OTHER): Payer: Medicare HMO | Admitting: Internal Medicine

## 2019-09-26 ENCOUNTER — Other Ambulatory Visit: Payer: Self-pay

## 2019-09-26 ENCOUNTER — Other Ambulatory Visit: Payer: Self-pay | Admitting: Internal Medicine

## 2019-09-26 ENCOUNTER — Telehealth: Payer: Self-pay | Admitting: Internal Medicine

## 2019-09-26 DIAGNOSIS — R300 Dysuria: Secondary | ICD-10-CM | POA: Diagnosis not present

## 2019-09-26 DIAGNOSIS — F439 Reaction to severe stress, unspecified: Secondary | ICD-10-CM | POA: Diagnosis not present

## 2019-09-26 DIAGNOSIS — N3000 Acute cystitis without hematuria: Secondary | ICD-10-CM | POA: Diagnosis not present

## 2019-09-26 LAB — URINALYSIS, MICROSCOPIC ONLY

## 2019-09-26 LAB — POCT URINALYSIS DIPSTICK
Glucose, UA: POSITIVE — AB
Nitrite, UA: POSITIVE
Protein, UA: POSITIVE — AB
Spec Grav, UA: <=1.005 — AB (ref 1.010–1.025)
Urobilinogen, UA: 4 E.U./dL — AB
pH, UA: 5 (ref 5.0–8.0)

## 2019-09-26 MED ORDER — NITROFURANTOIN MONOHYD MACRO 100 MG PO CAPS: 100.0000 mg | ORAL_CAPSULE | Freq: Two times a day (BID) | ORAL | 0 refills | Status: DC

## 2019-09-26 NOTE — Telephone Encounter (Signed)
I can work her in today at [employer]

## 2019-09-26 NOTE — Telephone Encounter (Signed)
Patient called in stating that she started having burning with urination that started over the weekend. No other acute symptoms noted. Patient is requesting to come in and leave a urine sample. I can have her come in today for the urine but wasn't sure if there was a specific day that you would like to work her in. There are no 12:00 openings until Thursday.

## 2019-09-26 NOTE — Telephone Encounter (Signed)
the patient called wanting to be worked in and to drop off a urine. She is having burning when she uses the bathroom

## 2019-09-26 NOTE — Addendum Note (Signed)
Addended by: Lars Masson on: 09/26/2019 01:11 PM   Modules accepted: Orders

## 2019-09-26 NOTE — Progress Notes (Signed)
Patient ID: Alexis Reed, female   DOB: Aug 31, 1945, 74 y.o.   MRN: [id number]   Virtual Visit via video Note  This visit type was conducted due to national recommendations for restrictions regarding the COVID-19 pandemic (e.g. social distancing).  This format is felt to be most appropriate for this patient at this time.  All issues noted in this document were discussed and addressed.  No physical exam was performed (except for noted visual exam findings with Video Visits).   I connected with Alexis Reed by a video enabled telemedicine application and verified that I am speaking with the correct person using two identifiers. Location patient: home Location provider: work  Persons participating in the virtual visit: patient, provider  The limitations, risks, security and privacy concerns of performing an evaluation and management service by video and the availability of in person appointments have been discussed. The patient expressed understanding and agreed to proceed.   Reason for visit: work in appt  HPI: Reports has had intermittent symptoms for the last several weeks.  Reports dysuria.  No fever.  No nausea or vomiting.  No hematuria.  Minimal pressure.  No back pain or increased abdominal pain.  Took keflex - left over from her mother's prescription.  Did help some.  Feels similar to UTI.    ROS: See pertinent positives and negatives per HPI.  Past Medical History:  Diagnosis Date  . Arthritis   . Benign breast lumps    multiple lumps biopsy and remove x's 5 last being 1979 by Dr. Sharlet Salina  . COPD (chronic obstructive pulmonary disease) (HCC)    MILD-RARELY EVER HAS TO USE COMBIVENT INHALER  . Fibroids    s/p hysterectomy  . GERD (gastroesophageal reflux disease)   . Hypercholesterolemia   . Osteoporosis   . Personal history of tobacco use, presenting hazards to health 08/07/2015    Past Surgical History:  Procedure Laterality Date  . ABDOMINAL HYSTERECTOMY  1997   with bilateral oophorectomy  . BREAST BIOPSY Right 1975   multiple biopsies done  . BREAST BIOPSY Left    multiple done  . BREAST BIOPSY Right 2016   stereo  . BREAST SURGERY Left    lump removed. Unsure of date  . BREAST SURGERY Right    lump removed. Unsure of date  . COLONOSCOPY  2010   Dr. Vira Agar  . LAPAROSCOPIC APPENDECTOMY N/A 12/21/2018   Procedure: APPENDECTOMY LAPAROSCOPIC CONVERTED TO OPEN;  Surgeon: Benjamine Sprague, DO;  Location: [facility] ORS;  Service: General;  Laterality: N/A;  . TONSILLECTOMY      Family History  Problem Relation Age of Onset  . Hyperlipidemia Mother   . Thyroid disease Mother   . Hypertension Mother   . Colon cancer Father   . Stroke Father   . Heart disease Father        myocardial infarction  . Breast cancer Neg Hx     SOCIAL HX: reviewed.    Current Outpatient Medications:  .  Calcium Carbonate-Vitamin D (CALCIUM 600-D) 600-400 MG-UNIT per tablet, Take 1 tablet by mouth daily. , Disp: , Rfl:  .  Cholecalciferol (VITAMIN D) 2000 UNITS tablet, Take 2,000 Units by mouth daily., Disp: , Rfl:  .  denosumab (PROLIA) 60 MG/ML SOSY injection, Inject 60 mg into the skin every 6 (six) months. , Disp: , Rfl:  .  ibuprofen (ADVIL) 800 MG tablet, Take 1 tablet (800 mg total) by mouth every 8 (eight) hours as needed for mild pain or moderate  pain., Disp: 30 tablet, Rfl: 0 .  Ipratropium-Albuterol (COMBIVENT IN), Inhale 1 puff into the lungs daily as needed (shortness of breath). PT STATES SHE HAS NOT USED IN A VERY LONG TIME, Disp: , Rfl:  .  nitrofurantoin, macrocrystal-monohydrate, (MACROBID) 100 MG capsule, Take 1 capsule (100 mg total) by mouth 2 (two) times daily., Disp: 10 capsule, Rfl: 0 .  omeprazole (PRILOSEC) 20 MG capsule, TAKE 1 CAPSULE BY MOUTH ONCE DAILY, Disp: 90 capsule, Rfl: 1 .  phenazopyridine (PYRIDIUM) 200 MG tablet, Take 1 tablet (200 mg total) by mouth 3 (three) times daily as needed for pain., Disp: 6 tablet, Rfl: 0 .  Probiotic Product  (PROBIOTIC DAILY PO), Take 1 capsule by mouth daily., Disp: , Rfl:  .  rosuvastatin (CRESTOR) 5 MG tablet, Take 1 tablet (5 mg total) by mouth every morning., Disp: 90 tablet, Rfl: 1  EXAM:  GENERAL: alert, oriented, appears well and in no acute distress  HEENT: atraumatic, conjunttiva clear, no obvious abnormalities on inspection of external nose and ears  NECK: normal movements of the head and neck  LUNGS: on inspection no signs of respiratory distress, breathing rate appears normal, no obvious gross SOB, gasping or wheezing  CV: no obvious cyanosis  PSYCH/NEURO: pleasant and cooperative, no obvious depression or anxiety, speech and thought processing grossly intact  ASSESSMENT AND PLAN:  Discussed the following assessment and plan:  UTI (urinary tract infection) Symptoms and urine dip as outlined.  Send urine for culture.  Treat with macrobid.  Stay hydrated.  Will await culture results.    Stress Overall handling things relatively well.  Has good support.  Follow.     Meds ordered this encounter  Medications  . nitrofurantoin, macrocrystal-monohydrate, (MACROBID) 100 MG capsule    Sig: Take 1 capsule (100 mg total) by mouth 2 (two) times daily.    Dispense:  10 capsule    Refill:  0     I discussed the assessment and treatment plan with the patient. The patient was provided an opportunity to ask questions and all were answered. The patient agreed with the plan and demonstrated an understanding of the instructions.   The patient was advised to call back or seek an in-person evaluation if the symptoms worsen or if the condition fails to improve as anticipated.    Einar Pheasant, MD

## 2019-09-26 NOTE — Telephone Encounter (Signed)
Pt scheduled and orders placed for urine.

## 2019-09-28 LAB — URINE CULTURE
MICRO NUMBER:: 10251914
SPECIMEN QUALITY:: ADEQUATE

## 2019-09-29 ENCOUNTER — Other Ambulatory Visit: Payer: Self-pay | Admitting: Internal Medicine

## 2019-09-29 DIAGNOSIS — R319 Hematuria, unspecified: Secondary | ICD-10-CM

## 2019-09-29 NOTE — Progress Notes (Signed)
Order placed for f/u urinalysis. Hold for culture.

## 2019-10-02 ENCOUNTER — Encounter: Payer: Self-pay | Admitting: Internal Medicine

## 2019-10-02 NOTE — Assessment & Plan Note (Signed)
Overall handling things relatively well.  Has good support.  Follow.  

## 2019-10-02 NOTE — Assessment & Plan Note (Signed)
Symptoms and urine dip as outlined.  Send urine for culture.  Treat with macrobid.  Stay hydrated.  Will await culture results.

## 2019-10-06 ENCOUNTER — Encounter: Payer: Self-pay | Admitting: Internal Medicine

## 2019-10-06 NOTE — Telephone Encounter (Signed)
Patient screened and aware of appt.

## 2019-10-10 ENCOUNTER — Ambulatory Visit: Payer: Medicare HMO | Admitting: Internal Medicine

## 2019-10-10 ENCOUNTER — Encounter: Payer: Self-pay | Admitting: Internal Medicine

## 2019-10-10 ENCOUNTER — Other Ambulatory Visit: Payer: Self-pay

## 2019-10-10 DIAGNOSIS — R002 Palpitations: Secondary | ICD-10-CM

## 2019-10-10 DIAGNOSIS — J438 Other emphysema: Secondary | ICD-10-CM

## 2019-10-10 DIAGNOSIS — N644 Mastodynia: Secondary | ICD-10-CM

## 2019-10-10 DIAGNOSIS — K219 Gastro-esophageal reflux disease without esophagitis: Secondary | ICD-10-CM

## 2019-10-10 DIAGNOSIS — E7800 Pure hypercholesterolemia, unspecified: Secondary | ICD-10-CM

## 2019-10-10 DIAGNOSIS — F439 Reaction to severe stress, unspecified: Secondary | ICD-10-CM

## 2019-10-10 NOTE — Progress Notes (Signed)
Patient ID: Alexis Reed, female   DOB: 1945-11-02, 74 y.o.   MRN: [id number]   Subjective:    Patient ID: Alexis Reed, female    DOB: 10/16/1945, 75 y.o.   MRN: [id number]  HPI This visit occurred during the SARS-CoV-2 public health emergency.  Safety protocols were in place, including screening questions prior to the visit, additional usage of staff PPE, and extensive cleaning of exam room while observing appropriate contact time as indicated for disinfecting solutions.  Patient here as a work in appt for evaluation of breast tenderness.  Reports left lateral breast tenderness.  Started in January.  Persistent.  Decided to come in for evaluation.  States pain localized to lateral left breast.  No nipple discharge or nipple retraction present.  No nodule appreciated.  She also reports increased heart palpitations.  Notices more when she is doing more strenuous activity.  No chest pain.  Also reports some light headedness.  Notices when bends over and then stands up.  No chest tightness. Breathing stable.  No abdominal pain.  Bowels moving.    Past Medical History:  Diagnosis Date  . Arthritis   . Benign breast lumps    multiple lumps biopsy and remove x's 5 last being 1979 by Dr. Sharlet Salina  . COPD (chronic obstructive pulmonary disease) ([facility])    MILD-RARELY EVER HAS TO USE COMBIVENT INHALER  . Fibroids    s/p hysterectomy  . GERD (gastroesophageal reflux disease)   . Hypercholesterolemia   . Osteoporosis   . Personal history of tobacco use, presenting hazards to health 08/07/2015   Past Surgical History:  Procedure Laterality Date  . ABDOMINAL HYSTERECTOMY  1997   with bilateral oophorectomy  . BREAST BIOPSY Right 1975   multiple biopsies done  . BREAST BIOPSY Left    multiple done  . BREAST BIOPSY Right 2016   stereo  . BREAST SURGERY Left    lump removed. Unsure of date  . BREAST SURGERY Right    lump removed. Unsure of date  . COLONOSCOPY  2010   Dr. Vira Agar  .  LAPAROSCOPIC APPENDECTOMY N/A 12/21/2018   Procedure: APPENDECTOMY LAPAROSCOPIC CONVERTED TO OPEN;  Surgeon: Benjamine Sprague, DO;  Location: [facility] ORS;  Service: General;  Laterality: N/A;  . TONSILLECTOMY     Family History  Problem Relation Age of Onset  . Hyperlipidemia Mother   . Thyroid disease Mother   . Hypertension Mother   . Colon cancer Father   . Stroke Father   . Heart disease Father        myocardial infarction  . Breast cancer Neg Hx    Social History   Socioeconomic History  . Marital status: Married    Spouse name: Not on file  . Number of children: 2  . Years of education: Not on file  . Highest education level: Not on file  Occupational History  . Not on file  Tobacco Use  . Smoking status: Former Smoker    Packs/day: 1.00    Years: 30.00    Pack years: 30.00    Types: Cigarettes    Quit date: 04/26/2004    Years since quitting: 15.4  . Smokeless tobacco: Never Used  Substance and Sexual Activity  . Alcohol use: Yes    Alcohol/week: 0.0 standard drinks    Comment: occassional wine every other day  . Drug use: No  . Sexual activity: Not on file  Other Topics Concern  . Not on file  Social History  Narrative   She is married, has two children. Works at [employer] Strain:   . Difficulty of Paying Living Expenses:   Food Insecurity:   . Worried About Charity fundraiser in the Last Year:   . Arboriculturist in the Last Year:   Transportation Needs:   . Film/video editor (Medical):   Marland Kitchen Lack of Transportation (Non-Medical):   Physical Activity:   . Days of Exercise per Week:   . Minutes of Exercise per Session:   Stress:   . Feeling of Stress :   Social Connections:   . Frequency of Communication with Friends and Family:   . Frequency of Social Gatherings with Friends and Family:   . Attends Religious Services:   . Active Member of Clubs or Organizations:   .  Attends Archivist Meetings:   Marland Kitchen Marital Status:     Outpatient Encounter Medications as of 10/10/2019  Medication Sig  . Calcium Carbonate-Vitamin D (CALCIUM 600-D) 600-400 MG-UNIT per tablet Take 1 tablet by mouth daily.   . Cholecalciferol (VITAMIN D) 2000 UNITS tablet Take 2,000 Units by mouth daily.  Marland Kitchen denosumab (PROLIA) 60 MG/ML SOSY injection Inject 60 mg into the skin every 6 (six) months.   Marland Kitchen ibuprofen (ADVIL) 800 MG tablet Take 1 tablet (800 mg total) by mouth every 8 (eight) hours as needed for mild pain or moderate pain.  . Ipratropium-Albuterol (COMBIVENT IN) Inhale 1 puff into the lungs daily as needed (shortness of breath). PT STATES SHE HAS NOT USED IN A VERY LONG TIME  . omeprazole (PRILOSEC) 20 MG capsule TAKE 1 CAPSULE BY MOUTH ONCE DAILY  . Probiotic Product (PROBIOTIC DAILY PO) Take 1 capsule by mouth daily.  . rosuvastatin (CRESTOR) 5 MG tablet Take 1 tablet (5 mg total) by mouth every morning.  . [DISCONTINUED] nitrofurantoin, macrocrystal-monohydrate, (MACROBID) 100 MG capsule Take 1 capsule (100 mg total) by mouth 2 (two) times daily.  . [DISCONTINUED] phenazopyridine (PYRIDIUM) 200 MG tablet Take 1 tablet (200 mg total) by mouth 3 (three) times daily as needed for pain.   No facility-administered encounter medications on file as of 10/10/2019.   Review of Systems  Constitutional: Negative for appetite change and unexpected weight change.  HENT: Negative for congestion and sinus pressure.   Respiratory: Negative for cough, chest tightness and shortness of breath.   Cardiovascular: Positive for palpitations. Negative for chest pain and leg swelling.  Gastrointestinal: Negative for abdominal pain, diarrhea, nausea and vomiting.  Genitourinary: Negative for difficulty urinating and dysuria.  Musculoskeletal: Negative for joint swelling and myalgias.  Skin: Negative for color change and rash.  Neurological: Positive for dizziness. Negative for light-headedness  and headaches.  Psychiatric/Behavioral: Negative for agitation and dysphoric mood.       Objective:    Physical Exam Constitutional:      General: She is not in acute distress.    Appearance: Normal appearance.  HENT:     Head: Normocephalic and atraumatic.     Right Ear: External ear normal.     Left Ear: External ear normal.  Eyes:     General: No scleral icterus.       Right eye: No discharge.        Left eye: No discharge.     Conjunctiva/sclera: Conjunctivae normal.  Neck:     Thyroid: No thyromegaly.  Cardiovascular:     Rate and Rhythm:  Normal rate and regular rhythm.  Pulmonary:     Effort: No respiratory distress.     Breath sounds: Normal breath sounds. No wheezing.     Comments: Breasts:  No nipple discharge or nipple retraction present.  Could not appreciate any distinct nodules or axillary adenopathy.  Increased pain - 3:00 left breast and 9:00 right breast.   Abdominal:     General: Bowel sounds are normal.     Palpations: Abdomen is soft.     Tenderness: There is no abdominal tenderness.  Musculoskeletal:        General: No swelling or tenderness.     Cervical back: Neck supple. No tenderness.  Lymphadenopathy:     Cervical: No cervical adenopathy.  Skin:    Findings: No erythema or rash.  Neurological:     Mental Status: She is alert.  Psychiatric:        Mood and Affect: Mood normal.        Behavior: Behavior normal.     BP 128/78   Pulse 94   Temp (!) 96.2 F (35.7 C)   Resp 16   Wt 194 lb (88 kg)   LMP 07/20/1995   SpO2 98%   BMI 34.37 kg/m  Wt Readings from Last 3 Encounters:  10/10/19 194 lb (88 kg)  07/07/19 180 lb (81.6 kg)  02/16/19 193 lb (87.5 kg)     Lab Results  Component Value Date   WBC 5.3 02/02/2019   HGB 13.8 02/02/2019   HCT 42.3 02/02/2019   PLT 279.0 02/02/2019   GLUCOSE 94 02/02/2019   CHOL 183 02/02/2019   TRIG 122.0 02/02/2019   HDL 57.80 02/02/2019   LDLDIRECT 153.0 07/17/2015   LDLCALC 101 (H)  02/02/2019   ALT 13 02/02/2019   AST 15 02/02/2019   NA 140 02/02/2019   K 4.2 02/02/2019   CL 104 02/02/2019   CREATININE 0.60 02/02/2019   BUN 11 02/02/2019   CO2 28 02/02/2019   TSH 1.83 02/02/2019       Assessment & Plan:   Problem List Items Addressed This Visit    Breast pain    Breast pain - noticed in left breast as outlined.  On exam also noticed in right breast.  Obtain diagnostic mammogram and possible ultrasound.  Further w/up pending results.       Relevant Orders   MM DIAG BREAST TOMO BILATERAL   US BREAST LTD UNI LEFT INC AXILLA   US BREAST LTD UNI RIGHT INC AXILLA   GERD (gastroesophageal reflux disease)    Controlled on omeprazole.  Follow.        GOLD GRADE A COPD with Centrilobular Emphysema    Seeing pulmonary.  Breathing stable.       Hypercholesterolemia    On crestor.  Low cholesterol diet and exercise.  Follow lipid panel and liver function tests.        Palpitations    Palpitations as outlined.  EKG - SR with no acute ischemic changes.  Given symptoms and risk factors, will refer to cardiology for evaluation and  Question of need for further cardiac w/up.        Relevant Orders   EKG 12-Lead   Ambulatory referral to Cardiology   Stress    Overall handling things relatively well.  Does not feel needs any further intervention.  Follow.            Einar Pheasant, MD

## 2019-10-16 ENCOUNTER — Encounter: Payer: Self-pay | Admitting: Internal Medicine

## 2019-10-16 NOTE — Assessment & Plan Note (Signed)
Controlled on omeprazole.  Follow.  

## 2019-10-16 NOTE — Assessment & Plan Note (Signed)
Overall handling things relatively well.  Does not feel needs any further intervention.  Follow.   

## 2019-10-16 NOTE — Assessment & Plan Note (Signed)
Breast pain - noticed in left breast as outlined.  On exam also noticed in right breast.  Obtain diagnostic mammogram and possible ultrasound.  Further w/up pending results.

## 2019-10-16 NOTE — Assessment & Plan Note (Signed)
Palpitations as outlined.  EKG - SR with no acute ischemic changes.  Given symptoms and risk factors, will refer to cardiology for evaluation and  Question of need for further cardiac w/up.

## 2019-10-16 NOTE — Assessment & Plan Note (Signed)
On crestor.  Low cholesterol diet and exercise.  Follow lipid panel and liver function tests.   

## 2019-10-16 NOTE — Assessment & Plan Note (Signed)
Seeing pulmonary.  Breathing stable.

## 2019-10-17 ENCOUNTER — Other Ambulatory Visit: Payer: Medicare HMO

## 2019-10-19 ENCOUNTER — Ambulatory Visit
Admission: RE | Admit: 2019-10-19 | Discharge: 2019-10-19 | Disposition: A | Discharge status: Home / Self Care | Payer: Medicare HMO | Source: Ambulatory Visit | Attending: Internal Medicine | Admitting: Internal Medicine

## 2019-10-19 DIAGNOSIS — N644 Mastodynia: Secondary | ICD-10-CM | POA: Diagnosis not present

## 2019-10-19 IMAGING — US US BREAST*R* LIMITED INC AXILLA
1 series · 2 of 2 positions shown · non-contrast
Comparison: Previous exam(s).

CLINICAL DATA: Patient complains of focal pain in the 3 o'clock
region of the left breast and in the 9 o'clock region of the right
breast.

EXAM:
DIGITAL DIAGNOSTIC BILATERAL MAMMOGRAM WITH CAD AND TOMO
ULTRASOUND BILATERAL BREAST

[Series 1: us breast*right* limited inc axilla · 0.08mm/px · 2 of 2 slices shown]
[im 1/2]
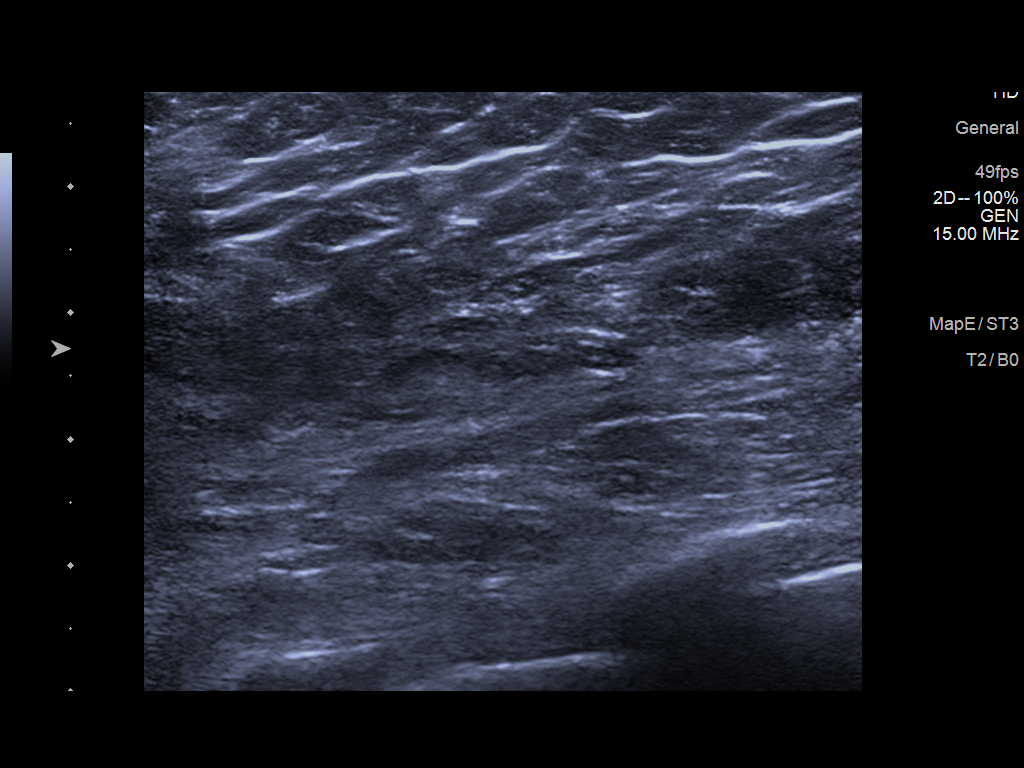
[im 2/2]
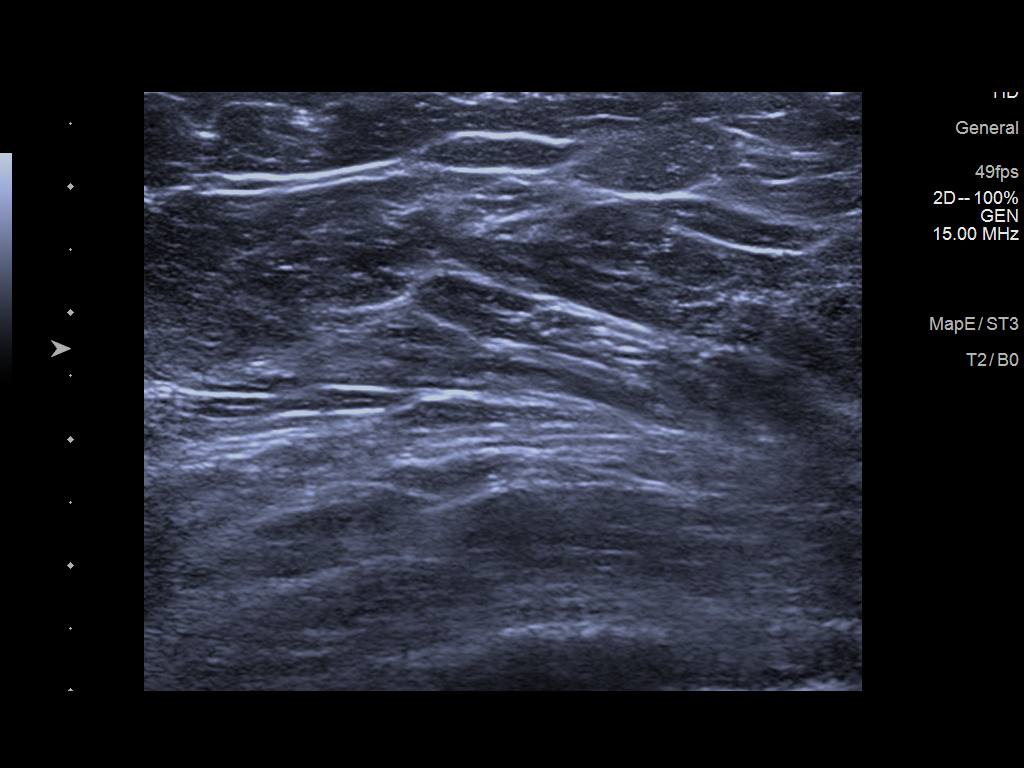

[2 of 2 positions shown; findings below may reference images not displayed]

ACR Breast Density Category b: There are scattered areas of
fibroglandular density.
FINDINGS: No suspicious mass, malignant type microcalcifications or distortion
detected in either breast.

Mammographic images were processed with CAD.

Targeted ultrasound is performed, showing normal tissue in the
patient's area of clinical concern in the right breast at 9 o'clock
9 cm from the nipple. There is a calcified oil cyst in the left
breast at 3 o'clock 7 cm from the nipple measuring 7 mm. No
suspicious mass identified in the 3 o'clock region of the left
breast.
IMPRESSION: No evidence of malignancy in either breast.

RECOMMENDATION:
Bilateral screening mammogram in 1 year is recommended.

I have discussed the findings and recommendations with the patient.
If applicable, a reminder letter will be sent to the patient
regarding the next appointment.

BI-RADS CATEGORY  2: Benign.

## 2019-10-19 IMAGING — US US BREAST*L* LIMITED INC AXILLA
1 series · 5 of 5 positions shown · non-contrast
Comparison: Previous exam(s).

CLINICAL DATA: Patient complains of focal pain in the 3 o'clock
region of the left breast and in the 9 o'clock region of the right
breast.

EXAM:
DIGITAL DIAGNOSTIC BILATERAL MAMMOGRAM WITH CAD AND TOMO
ULTRASOUND BILATERAL BREAST

[Series 1: us breast*left* limited inc axilla · 0.07mm/px · 5 of 5 slices shown]
[im 1/5]
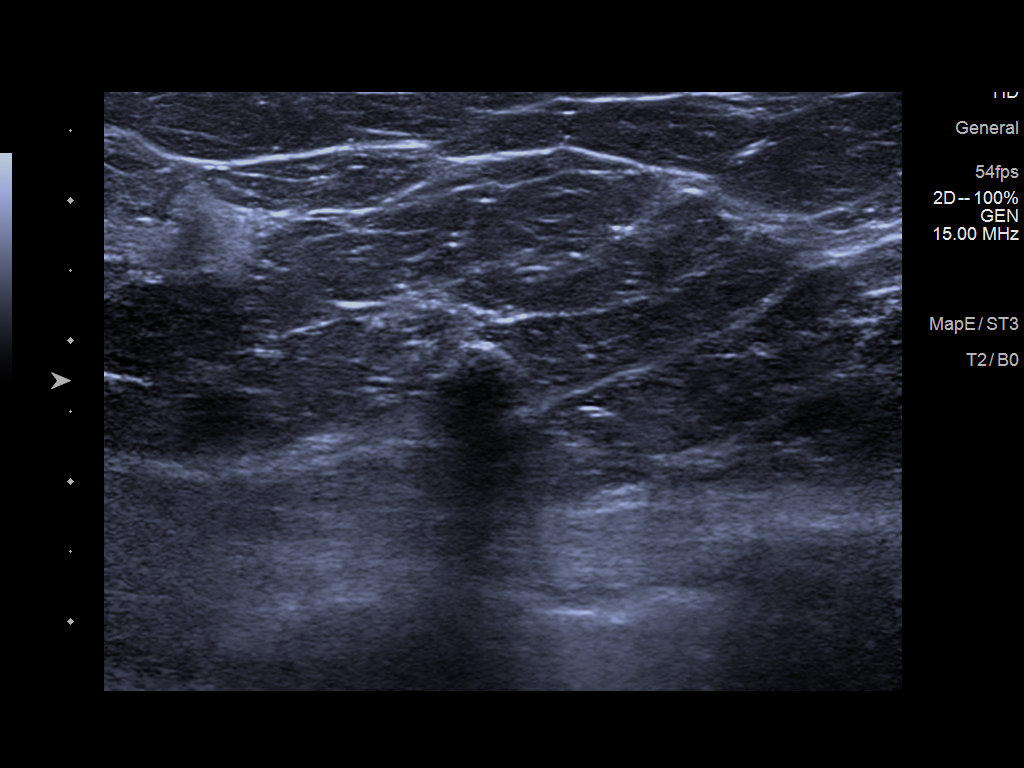
[im 2/5]
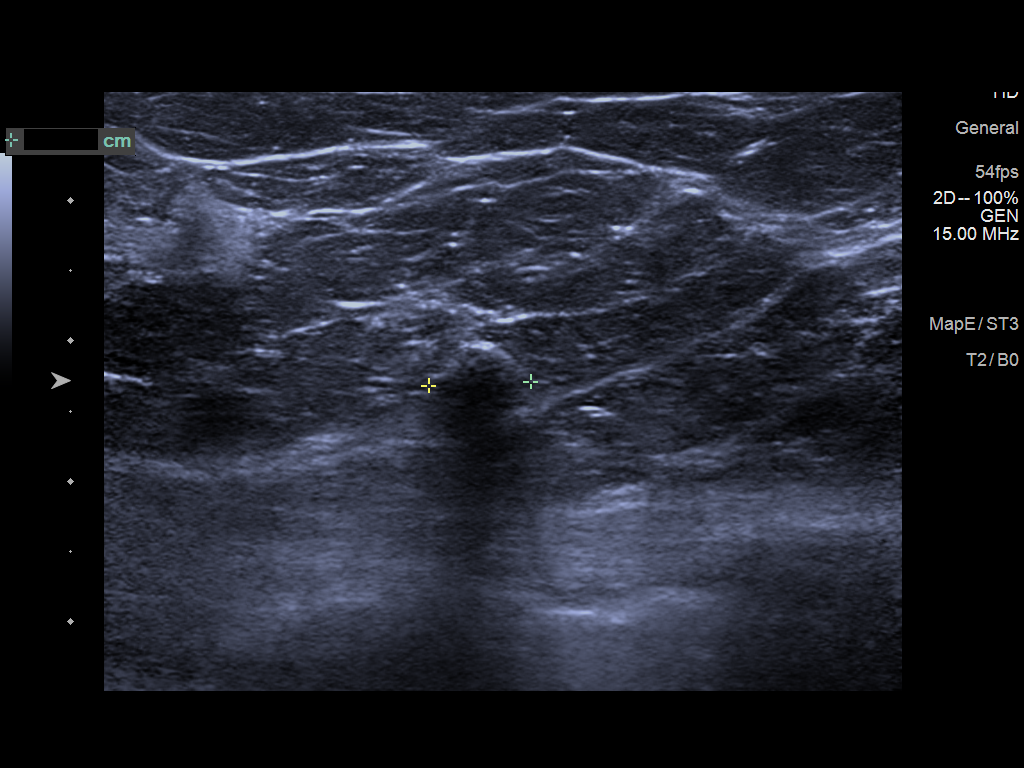
[im 3/5]
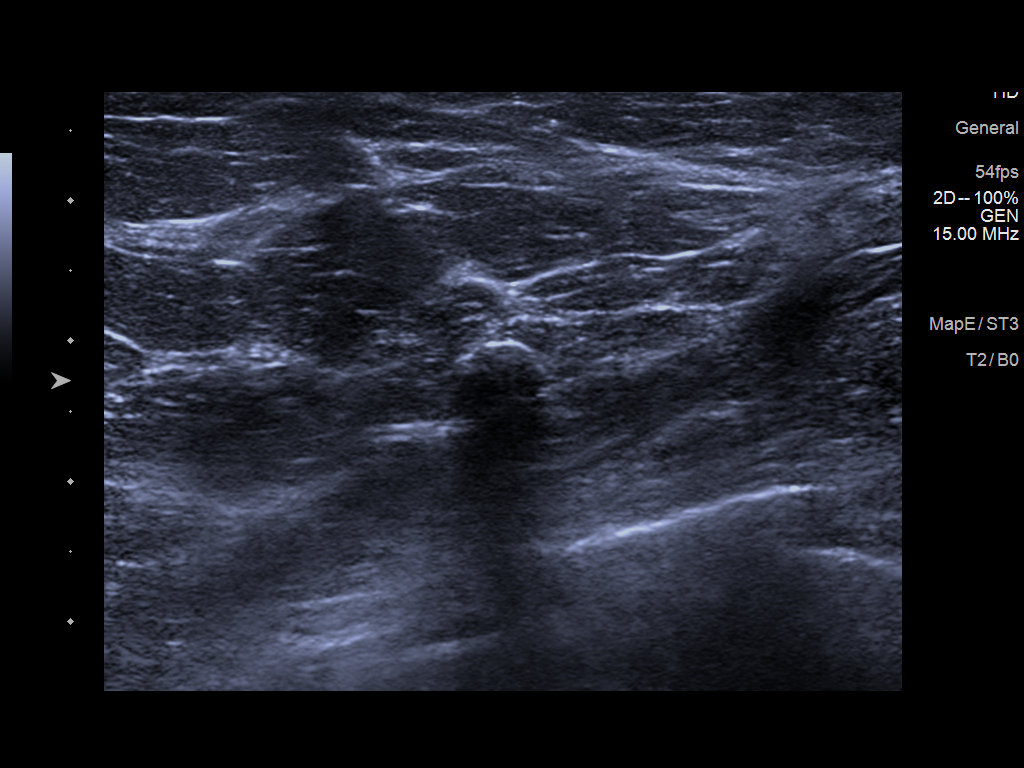
[im 4/5]
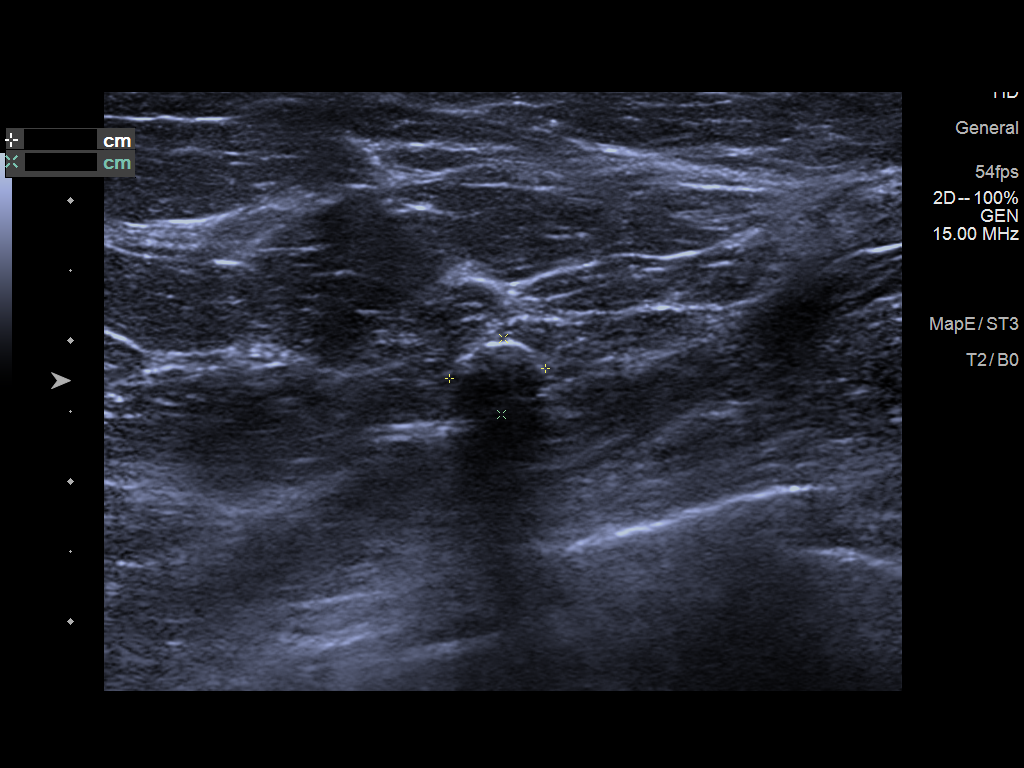
[im 5/5]
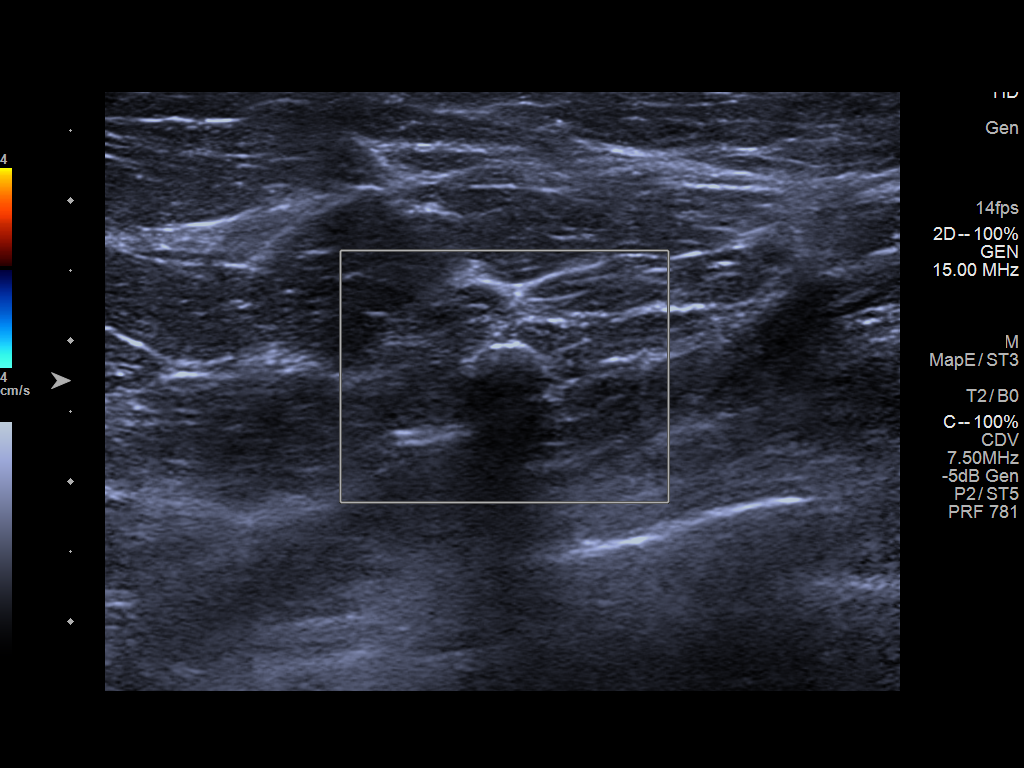

[5 of 5 positions shown; findings below may reference images not displayed]

ACR Breast Density Category b: There are scattered areas of
fibroglandular density.
FINDINGS: No suspicious mass, malignant type microcalcifications or distortion
detected in either breast.

Mammographic images were processed with CAD.

Targeted ultrasound is performed, showing normal tissue in the
patient's area of clinical concern in the right breast at 9 o'clock
9 cm from the nipple. There is a calcified oil cyst in the left
breast at 3 o'clock 7 cm from the nipple measuring 7 mm. No
suspicious mass identified in the 3 o'clock region of the left
breast.
IMPRESSION: No evidence of malignancy in either breast.

RECOMMENDATION:
Bilateral screening mammogram in 1 year is recommended.

I have discussed the findings and recommendations with the patient.
If applicable, a reminder letter will be sent to the patient
regarding the next appointment.

BI-RADS CATEGORY  2: Benign.

## 2019-10-19 IMAGING — MG DIGITAL DIAGNOSTIC BILAT W/ TOMO W/ CAD
8 of 14 series · 8 of 40 positions shown · non-contrast
Comparison: Previous exam(s).

CLINICAL DATA: Patient complains of focal pain in the 3 o'clock
region of the left breast and in the 9 o'clock region of the right
breast.

EXAM:
DIGITAL DIAGNOSTIC BILATERAL MAMMOGRAM WITH CAD AND TOMO
ULTRASOUND BILATERAL BREAST

[R MLO synth-2D]
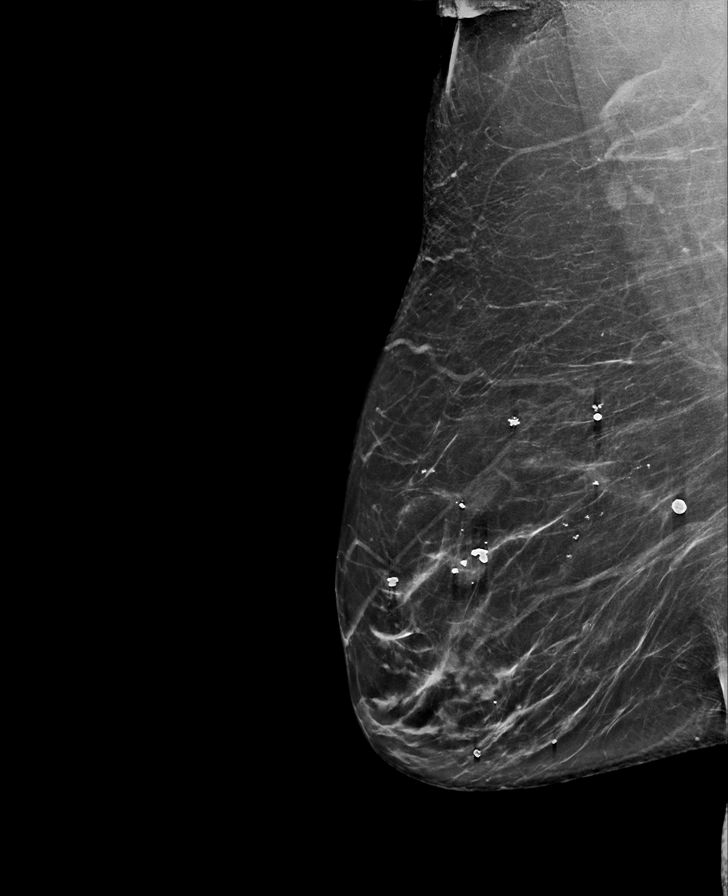

[R TAN synth-2D]
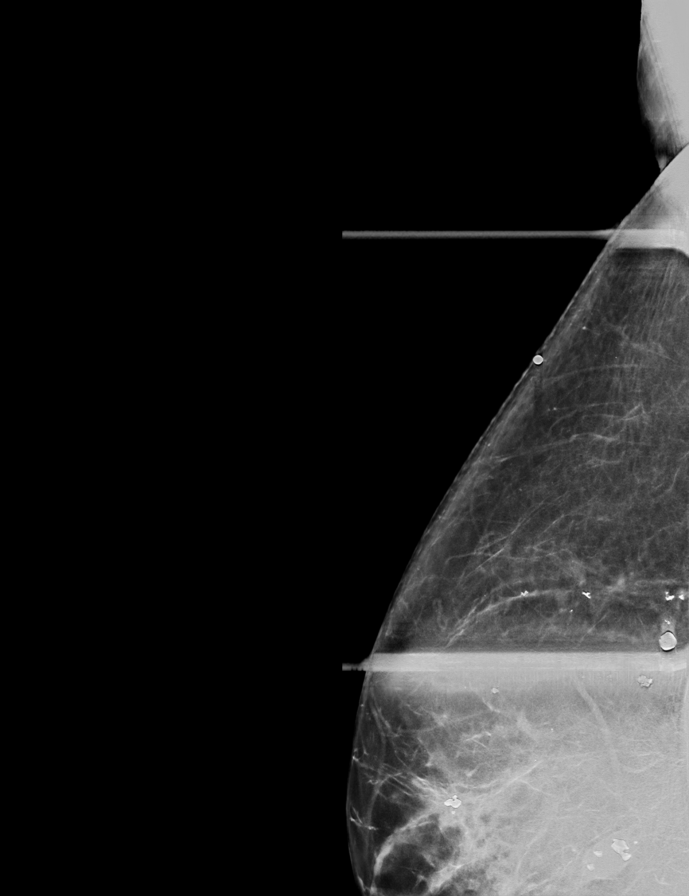

[R CC synth-2D]
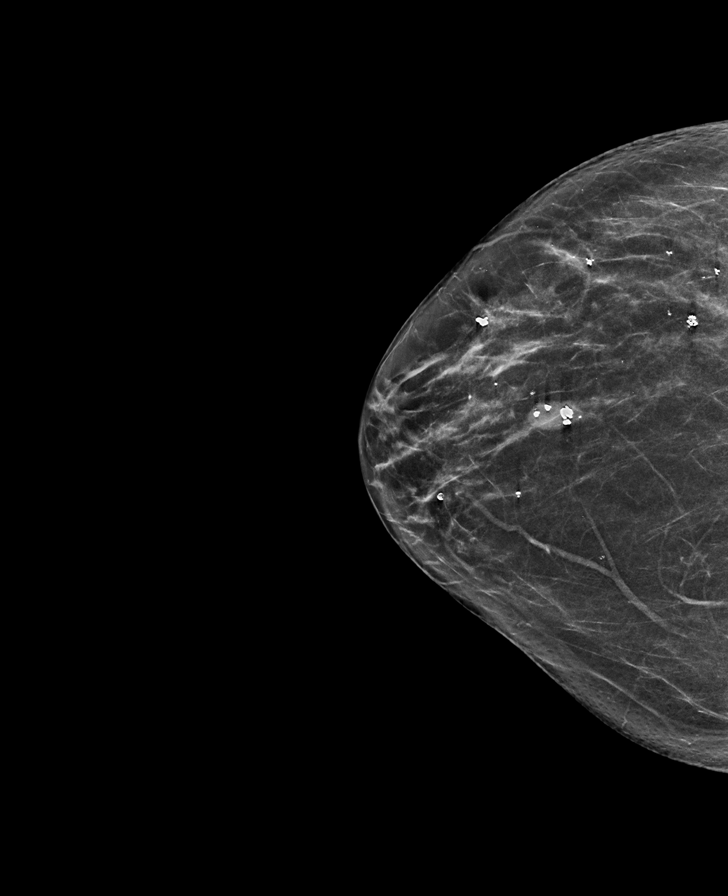

[L TAN synth-2D]
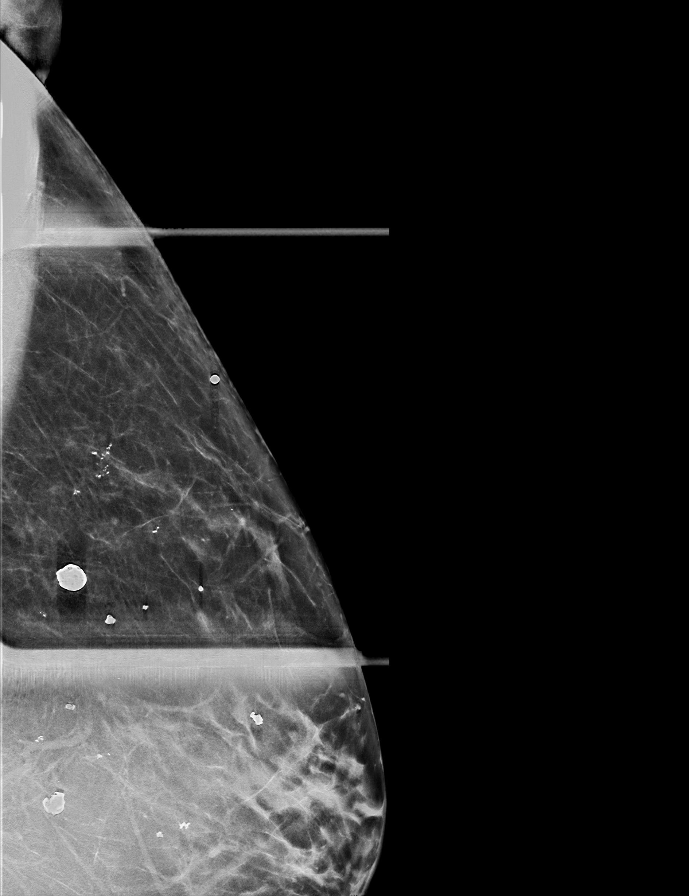

[L CC synth-2D]
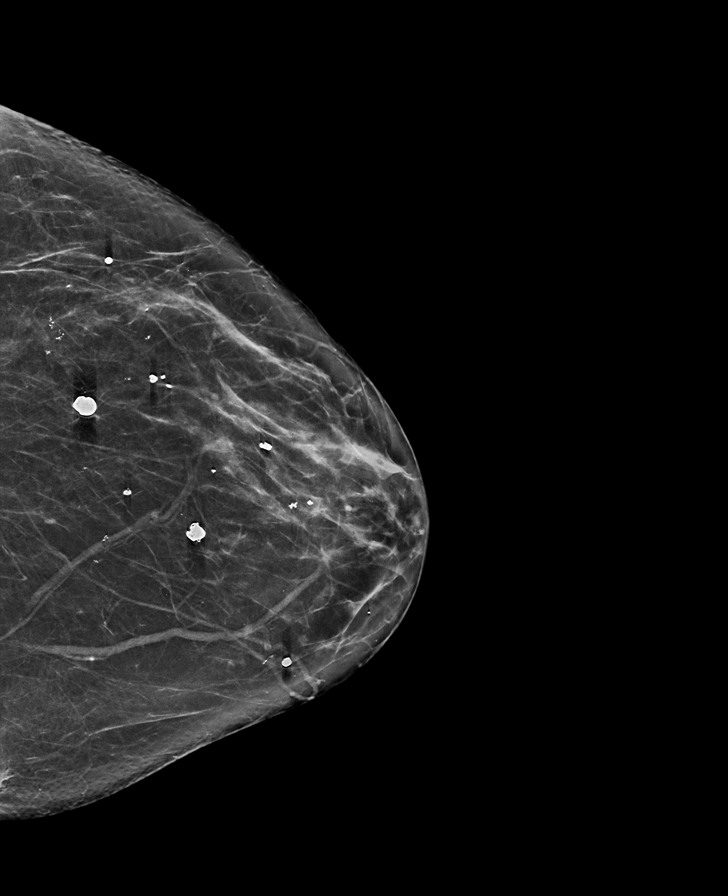

[L MLO synth-2D]
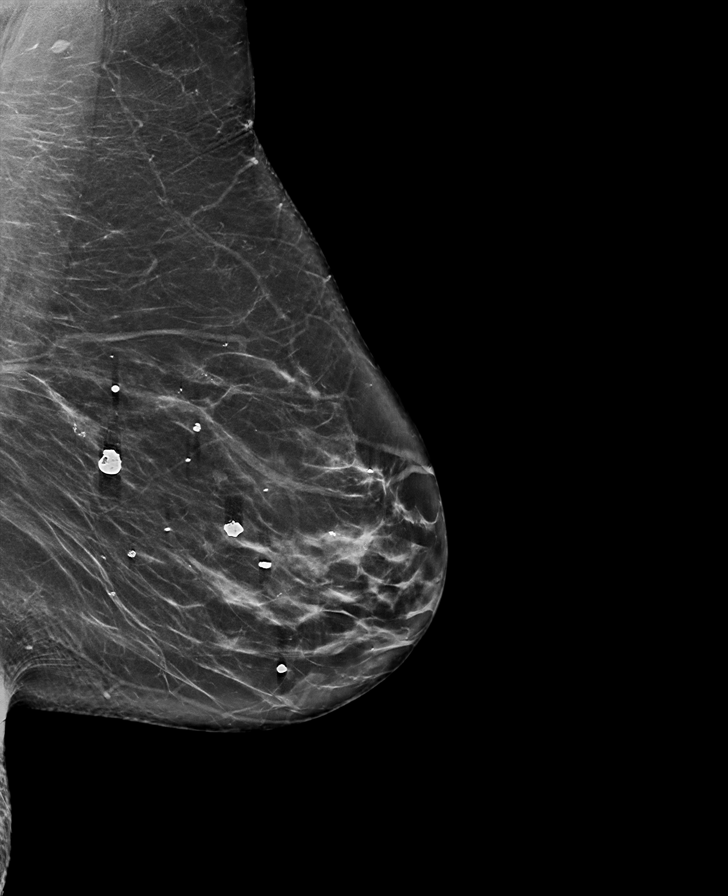

[R XCCL synth-2D]
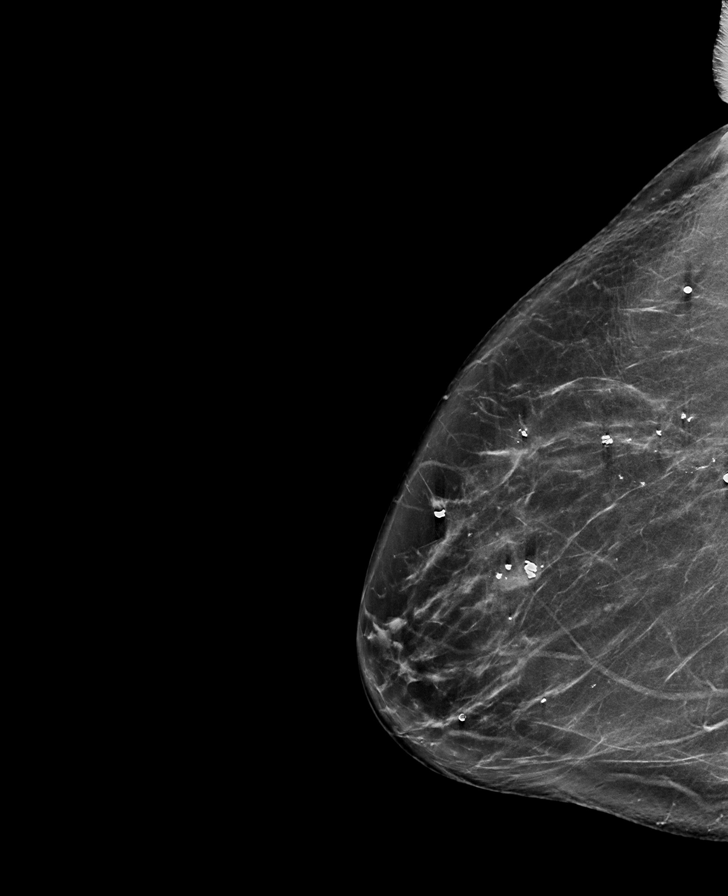

[R XCCL tomo · tomo slice 39/76.0]
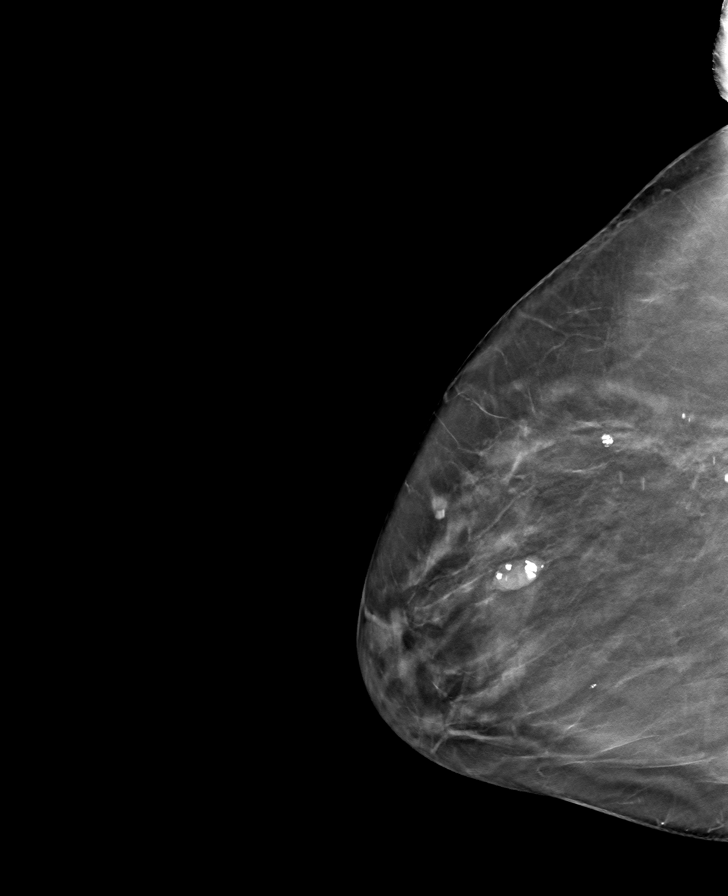

[8 of 40 positions shown; findings below may reference images not displayed]

ACR Breast Density Category b: There are scattered areas of
fibroglandular density.
FINDINGS: No suspicious mass, malignant type microcalcifications or distortion
detected in either breast.

Mammographic images were processed with CAD.

Targeted ultrasound is performed, showing normal tissue in the
patient's area of clinical concern in the right breast at 9 o'clock
9 cm from the nipple. There is a calcified oil cyst in the left
breast at 3 o'clock 7 cm from the nipple measuring 7 mm. No
suspicious mass identified in the 3 o'clock region of the left
breast.
IMPRESSION: No evidence of malignancy in either breast.

RECOMMENDATION:
Bilateral screening mammogram in 1 year is recommended.

I have discussed the findings and recommendations with the patient.
If applicable, a reminder letter will be sent to the patient
regarding the next appointment.

BI-RADS CATEGORY  2: Benign.

## 2019-10-31 ENCOUNTER — Other Ambulatory Visit: Payer: Self-pay

## 2019-10-31 ENCOUNTER — Other Ambulatory Visit (INDEPENDENT_AMBULATORY_CARE_PROVIDER_SITE_OTHER): Payer: Medicare HMO

## 2019-10-31 DIAGNOSIS — R319 Hematuria, unspecified: Secondary | ICD-10-CM

## 2019-10-31 DIAGNOSIS — E7800 Pure hypercholesterolemia, unspecified: Secondary | ICD-10-CM | POA: Diagnosis not present

## 2019-10-31 LAB — BASIC METABOLIC PANEL
BUN: 12 mg/dL (ref 6–23)
CO2: 32 mEq/L (ref 19–32)
Calcium: 9.4 mg/dL (ref 8.4–10.5)
Chloride: 104 mEq/L (ref 96–112)
Creatinine, Ser: 0.61 mg/dL (ref 0.40–1.20)
GFR: 96.51 mL/min (ref >60.00)
Glucose, Bld: 96 mg/dL (ref 70–99)
Potassium: 4 mEq/L (ref 3.5–5.1)
Sodium: 141 mEq/L (ref 135–145)

## 2019-10-31 LAB — HEPATIC FUNCTION PANEL
ALT: 12 U/L (ref 0–35)
AST: 14 U/L (ref 0–37)
Albumin: 4.2 g/dL (ref 3.5–5.2)
Alkaline Phosphatase: 66 U/L (ref 39–117)
Bilirubin, Direct: 0.1 mg/dL (ref 0.0–0.3)
Total Bilirubin: 0.5 mg/dL (ref 0.2–1.2)
Total Protein: 6.8 g/dL (ref 6.0–8.3)

## 2019-10-31 LAB — LIPID PANEL
Cholesterol: 220 mg/dL — ABNORMAL HIGH (ref 0–200)
HDL: 56.5 mg/dL (ref >39.00)
LDL Cholesterol: 143 mg/dL — ABNORMAL HIGH (ref 0–99)
NonHDL: 163.44
Total CHOL/HDL Ratio: 4
Triglycerides: 104 mg/dL (ref 0.0–149.0)
VLDL: 20.8 mg/dL (ref 0.0–40.0)

## 2019-10-31 NOTE — Addendum Note (Signed)
Addended by: Leeanne Rio on: 10/31/2019 09:10 AM   Modules accepted: Orders

## 2019-11-04 ENCOUNTER — Other Ambulatory Visit: Payer: Self-pay

## 2019-11-04 MED ORDER — PRAVASTATIN SODIUM 10 MG PO TABS: 10.0000 mg | ORAL_TABLET | ORAL | 0 refills | Status: DC

## 2019-11-22 ENCOUNTER — Telehealth: Payer: Self-pay | Admitting: Internal Medicine

## 2019-11-22 NOTE — Telephone Encounter (Signed)
Rejection Reason - Patient Declined - patient cancelled 2 appointments " [facility] said 18 minutes ago

## 2019-12-05 ENCOUNTER — Other Ambulatory Visit: Payer: Self-pay | Admitting: Internal Medicine

## 2019-12-07 ENCOUNTER — Other Ambulatory Visit: Payer: Self-pay

## 2019-12-07 ENCOUNTER — Other Ambulatory Visit: Payer: Self-pay | Admitting: Internal Medicine

## 2019-12-07 ENCOUNTER — Other Ambulatory Visit (INDEPENDENT_AMBULATORY_CARE_PROVIDER_SITE_OTHER): Payer: Medicare HMO

## 2019-12-07 DIAGNOSIS — E7800 Pure hypercholesterolemia, unspecified: Secondary | ICD-10-CM

## 2019-12-07 DIAGNOSIS — R319 Hematuria, unspecified: Secondary | ICD-10-CM | POA: Diagnosis not present

## 2019-12-07 NOTE — Progress Notes (Signed)
Order placed for f/u liver panel.  

## 2019-12-07 NOTE — Addendum Note (Signed)
Addended by: Ezequiel Ganser on: 12/07/2019 02:45 PM   Modules accepted: Orders

## 2019-12-08 LAB — URINALYSIS, ROUTINE W REFLEX MICROSCOPIC
Bilirubin Urine: NEGATIVE
Hgb urine dipstick: NEGATIVE
Ketones, ur: NEGATIVE
Leukocytes,Ua: NEGATIVE
Nitrite: NEGATIVE
RBC / HPF: NONE SEEN (ref 0–?)
Specific Gravity, Urine: 1.025 (ref 1.000–1.030)
Total Protein, Urine: NEGATIVE
Urine Glucose: NEGATIVE
Urobilinogen, UA: 0.2 (ref 0.0–1.0)
pH: 5.5 (ref 5.0–8.0)

## 2019-12-08 LAB — HEPATIC FUNCTION PANEL
ALT: 25 U/L (ref 0–35)
AST: 14 U/L (ref 0–37)
Albumin: 4.3 g/dL (ref 3.5–5.2)
Alkaline Phosphatase: 76 U/L (ref 39–117)
Bilirubin, Direct: 0.1 mg/dL (ref 0.0–0.3)
Total Bilirubin: 0.6 mg/dL (ref 0.2–1.2)
Total Protein: 6.6 g/dL (ref 6.0–8.3)

## 2019-12-09 ENCOUNTER — Encounter: Payer: Self-pay | Admitting: Internal Medicine

## 2020-01-05 ENCOUNTER — Ambulatory Visit
Admission: EM | Admit: 2020-01-05 | Discharge: 2020-01-05 | Disposition: A | Discharge status: Home / Self Care | Payer: Medicare HMO | Attending: Family Medicine | Admitting: Family Medicine

## 2020-01-05 ENCOUNTER — Encounter: Payer: Self-pay | Admitting: Emergency Medicine

## 2020-01-05 DIAGNOSIS — R0789 Other chest pain: Secondary | ICD-10-CM | POA: Diagnosis not present

## 2020-01-05 DIAGNOSIS — S81811A Laceration without foreign body, right lower leg, initial encounter: Secondary | ICD-10-CM | POA: Diagnosis not present

## 2020-01-05 MED ORDER — TRAMADOL HCL 50 MG PO TABS: 50.0000 mg | ORAL_TABLET | Freq: Two times a day (BID) | ORAL | 0 refills | Status: DC | PRN

## 2020-01-05 NOTE — Discharge Instructions (Signed)
Daily dressing changes.  Keep clean.  Medication as needed for pain.  Take care  Dr. Lacinda Axon

## 2020-01-05 NOTE — ED Provider Notes (Signed)
[facility]    CSN: [id number] Arrival date & time: 01/05/20  1035  History   Chief Complaint Chief Complaint  Patient presents with   skin tear   HPI  74 year old female presents with a fall.  Patient states that she was going out on her screen and porch last night.  She states that she tripped over a stool.  She fell forward.  She injured her chest wall and left breast.  She also suffered a skin tear to the right leg.  She states that her pain is mild to moderate, 5/10 in severity.  She reports bruising to her left breast.  No relieving factors.  No head injury.  No other associated symptoms.  No other complaints.  Past Medical History:  Diagnosis Date   Arthritis    Benign breast lumps    multiple lumps biopsy and remove x's 5 last being 1979 by Dr. 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   Abdominal pain 08/22/2018   History of colonic polyps 02/04/2016   Personal history of tobacco use, presenting hazards to health 08/07/2015   Health care maintenance 11/18/2014   Breast calcifications on mammogram 10/18/2014   Obesity 07/23/2014   GOLD GRADE A COPD with Centrilobular Emphysema 04/26/2014   Tobacco use disorder 04/26/2014   Abnormal mammogram 04/12/2014   UTI (urinary tract infection) 04/02/2014   Stress 02/25/2014   Internal hemorrhoids without complication 64/40/3474   Osteoporosis 06/18/2012   GERD (gastroesophageal reflux disease) 06/18/2012   Hypercholesterolemia 06/18/2012   Past Surgical History:  Procedure Laterality Date     ABDOMINAL HYSTERECTOMY  1997   with bilateral oophorectomy   BREAST BIOPSY Right 1975   multiple biopsies done   BREAST BIOPSY Left    multiple done   BREAST BIOPSY Right 2016   stereo- neg. FC changes   BREAST SURGERY Left    lump removed. Unsure of date   BREAST SURGERY Right    lump removed. Unsure of date   COLONOSCOPY  2010   Dr. Vira Agar   LAPAROSCOPIC APPENDECTOMY N/A 12/21/2018   Procedure: APPENDECTOMY LAPAROSCOPIC CONVERTED TO OPEN;  Surgeon: Benjamine Sprague, DO;  Location: [facility] ORS;  Service: General;  Laterality: N/A;   TONSILLECTOMY     OB History    Gravida  2   Para  2   Term      Preterm      AB      Living  2     SAB      TAB      Ectopic      Multiple      Live Births           Obstetric Comments  1st Menstrual Cycle:  12 1st Pregnancy: 30       Home Medications    Prior to Admission medications   Medication Sig Start Date End Date Taking? Authorizing Provider  Calcium Carbonate-Vitamin D (CALCIUM 600-D) 600-400 MG-UNIT per tablet Take 1 tablet by mouth daily.    Yes [provider]  denosumab (PROLIA)  60 MG/ML SOSY injection Inject 60 mg into the skin every 6 (six) months.    Yes [provider]  Ipratropium-Albuterol (COMBIVENT IN) Inhale 1 puff into the lungs daily as needed (shortness of breath). PT STATES SHE HAS NOT USED IN A VERY LONG TIME   Yes [provider]  omeprazole (PRILOSEC) 20 MG capsule TAKE 1 CAPSULE BY MOUTH ONCE DAILY 09/27/19  Yes Einar Pheasant, MD  pravastatin (PRAVACHOL) 10 MG tablet TAKE 1 TABLET BY MOUTH MONDAY  [facility] FRIDAY 12/05/19  Yes Einar Pheasant, MD  Probiotic Product (PROBIOTIC DAILY PO) Take 1 capsule by mouth daily.   Yes [provider]  Cholecalciferol (VITAMIN D) 2000 UNITS tablet Take 2,000 Units by mouth daily.    [provider]  ibuprofen (ADVIL) 800 MG tablet Take 1 tablet (800 mg total) by mouth every 8 (eight) hours as needed for mild  pain or moderate pain. 12/22/18   Lysle Pearl, Isami, DO  rosuvastatin (CRESTOR) 5 MG tablet Take 1 tablet (5 mg total) by mouth every morning. 04/29/19   Einar Pheasant, MD  traMADol (ULTRAM) 50 MG tablet Take 1 tablet (50 mg total) by mouth every 12 (twelve) hours as needed. 01/05/20   Coral Spikes, DO   Family History Family History  Problem Relation Age of Onset   Hyperlipidemia Mother    Thyroid disease Mother    Hypertension Mother    Colon cancer Father    Stroke Father    Heart disease Father        myocardial infarction   Breast cancer Neg Hx    Social History Social History   Tobacco Use   Smoking status: Former Smoker    Packs/day: 1.00    Years: 30.00    Pack years: 30.00    Types: Cigarettes    Quit date: 04/26/2004    Years since quitting: 15.7   Smokeless tobacco: Never Used  Vaping Use   Vaping Use: Never used  Substance Use Topics   Alcohol use: Yes    Alcohol/week: 0.0 standard drinks    Comment: occassional wine every other day   Drug use: No   Allergies   Boniva [ibandronic acid], Codeine, Fosamax [alendronate sodium], Lipitor [atorvastatin], Other, and Penicillins   Review of Systems Review of Systems  Musculoskeletal:       Chest wall pain.  Skin:       Skin tear - right lower leg.   Physical Exam Triage Vital Signs ED Triage Vitals [01/05/20 1057]  Enc Vitals Group     BP (!) 151/95     Pulse Rate 78     Resp 18     Temp 98.1 F (36.7 C)     Temp Source Oral     SpO2 99 %     Weight 185 lb (83.9 kg)     Height 5\' 3"  (1.6 m)     Head Circumference      Peak Flow      Pain Score 5     Pain Loc      Pain Edu?      Excl. in [place]?    Updated Vital Signs BP (!) 151/95 (BP Location: Right Arm)    Pulse 78    Temp 98.1 F (36.7 C) (Oral)    Resp 18    Ht 5\' 3"  (1.6 m)    Wt 83.9 kg    LMP 07/20/1995    SpO2 99%    BMI 32.77 kg/m  Visual Acuity Right Eye Distance:   Left Eye Distance:   Bilateral Distance:    Right Eye  Near:   Left Eye Near:    Bilateral Near:     Physical Exam Vitals and nursing note reviewed.  Constitutional:      General: She is not in acute distress.    Appearance: Normal appearance. She is not ill-appearing.  HENT:     Head: Normocephalic and atraumatic.  Eyes:     General:        Right eye: No discharge.        Left eye: No discharge.     Conjunctiva/sclera: Conjunctivae normal.  Pulmonary:     Effort: Pulmonary effort is normal. No respiratory distress.  Chest:     Chest wall: Tenderness present.  Skin:         Comments: Large skin tear noted at the labelled location. Bruising noted to the left breast.  Neurological:     Mental Status: She is alert.  Psychiatric:        Mood and Affect: Mood normal.        Behavior: Behavior normal.    UC Treatments / Results  Labs (all labs ordered are listed, but only abnormal results are displayed) Labs Reviewed - No data to display  EKG   Radiology No results found.  Procedures Procedures (including critical care time)  Medications Ordered in UC Medications - No data to display  Initial Impression / Assessment and Plan / UC Course  I have reviewed the triage vital signs and the nursing notes.  Pertinent labs & imaging results that were available during my care of the patient were reviewed by me and considered in my medical decision making (see chart for details).    74 year old female presents with recent fall.  She has suffered a skin tear.  She also has chest wall pain and bruising to the left breast.  She is well-appearing.  Treating skin tear supportively.  This is not amendable to suturing.  Wound was dressed with a nonstick dressing.  Advised daily dressing changes and to keep the wound clean.  Tramadol as needed for pain.  Supportive care.  Final Clinical Impressions(s) / UC Diagnoses   Final diagnoses:  Skin tear of right lower leg without complication, initial encounter  Chest wall pain      Discharge Instructions     Daily dressing changes.  Keep clean.  Medication as needed for pain.  Take care  Dr. Lacinda Axon    ED Prescriptions    Medication Sig Dispense Auth. Provider   traMADol (ULTRAM) 50 MG tablet Take 1 tablet (50 mg total) by mouth every 12 (twelve) hours as needed. 10 tablet Thersa Salt G, DO     I have reviewed the PDMP during this encounter.   Coral Spikes, [place] 01/05/20 1156

## 2020-01-05 NOTE — ED Triage Notes (Signed)
Patient states she tripped over a stool last night falling and injuring her right knee and her chest. She has a skin tear to her right knee and states she has a bruise on her left breast and the right side of her chest is sore.

## 2020-01-10 ENCOUNTER — Encounter: Payer: Self-pay | Admitting: Internal Medicine

## 2020-01-10 ENCOUNTER — Other Ambulatory Visit: Payer: Self-pay

## 2020-01-10 ENCOUNTER — Ambulatory Visit (INDEPENDENT_AMBULATORY_CARE_PROVIDER_SITE_OTHER): Payer: Medicare HMO | Admitting: Internal Medicine

## 2020-01-10 VITALS — BP 122/72 | HR 97 | Temp 97.3°F | Resp 16 | Ht 65.0 in | Wt 195.0 lb

## 2020-01-10 DIAGNOSIS — S81801D Unspecified open wound, right lower leg, subsequent encounter: Secondary | ICD-10-CM

## 2020-01-10 DIAGNOSIS — S81019A Laceration without foreign body, unspecified knee, initial encounter: Secondary | ICD-10-CM | POA: Insufficient documentation

## 2020-01-10 DIAGNOSIS — N3000 Acute cystitis without hematuria: Secondary | ICD-10-CM

## 2020-01-10 DIAGNOSIS — F439 Reaction to severe stress, unspecified: Secondary | ICD-10-CM

## 2020-01-10 DIAGNOSIS — Z0000 Encounter for general adult medical examination without abnormal findings: Secondary | ICD-10-CM

## 2020-01-10 DIAGNOSIS — S81011D Laceration without foreign body, right knee, subsequent encounter: Secondary | ICD-10-CM

## 2020-01-10 DIAGNOSIS — R300 Dysuria: Secondary | ICD-10-CM | POA: Diagnosis not present

## 2020-01-10 DIAGNOSIS — R002 Palpitations: Secondary | ICD-10-CM

## 2020-01-10 DIAGNOSIS — J438 Other emphysema: Secondary | ICD-10-CM

## 2020-01-10 DIAGNOSIS — E7800 Pure hypercholesterolemia, unspecified: Secondary | ICD-10-CM

## 2020-01-10 DIAGNOSIS — K219 Gastro-esophageal reflux disease without esophagitis: Secondary | ICD-10-CM

## 2020-01-10 LAB — POCT URINALYSIS DIPSTICK
Bilirubin, UA: NEGATIVE
Blood, UA: NEGATIVE
Glucose, UA: POSITIVE — AB
Ketones, UA: NEGATIVE
Nitrite, UA: NEGATIVE
Protein, UA: NEGATIVE
Spec Grav, UA: <=1.005 — AB (ref 1.010–1.025)
Urobilinogen, UA: 0.2 E.U./dL
pH, UA: 5 (ref 5.0–8.0)

## 2020-01-10 LAB — URINALYSIS, MICROSCOPIC ONLY

## 2020-01-10 MED ORDER — NITROFURANTOIN MONOHYD MACRO 100 MG PO CAPS: 100.0000 mg | ORAL_CAPSULE | Freq: Two times a day (BID) | ORAL | 0 refills | Status: DC

## 2020-01-10 NOTE — Patient Instructions (Signed)
Take a probiotic daily while on the antibiotic and for two weeks after completing the antibiotic.   

## 2020-01-10 NOTE — Progress Notes (Signed)
Patient ID: Alexis Reed, female   DOB: [birth year], 74 y.o.   MRN: [id number]   Subjective:    Patient ID: Alexis Reed, female    DOB: [birth year], 74 y.o.   MRN: [id number]  HPI This visit occurred during the SARS-CoV-2 public health emergency.  Safety protocols were in place, including screening questions prior to the visit, additional usage of staff PPE, and extensive cleaning of exam room while observing appropriate contact time as indicated for disinfecting solutions.  Patient here for her physical exam.  She fell and was evaluated 01/05/20 - Dr Lacinda Axon.  Tripped over a stool.  Injured her left breast and chest.  Skin tear - right leg.  No head injury.  Able to walk without pain.  Discussed referral to wound clinic.  No significant pain.  No sob.  Breathing stable.  No abdominal pain or bowel change reported.  Concern regarding possible UTI.  No vaginal symptoms.  Stopped cholesterol medication previously due to aching.  Tolerating new.  Occasional fluttering.  No associated symptoms.  Due colonoscopy.  She received notice.  States will call and schedule.  Tries to stay active.  Handling stress.  Overall she feels she is doing relatively well.     Past Medical History:  Diagnosis Date  . Arthritis   . Benign breast lumps    multiple lumps biopsy and remove x's 5 last being 1979 by Dr. Sharlet Salina  . COPD (chronic obstructive pulmonary disease) ([facility])    MILD-RARELY EVER HAS TO USE COMBIVENT INHALER  . Fibroids    s/p hysterectomy  . GERD (gastroesophageal reflux disease)   . Hypercholesterolemia   . Osteoporosis   . Personal history of tobacco use, presenting hazards to health 08/07/2015   Past Surgical History:  Procedure Laterality Date  . ABDOMINAL HYSTERECTOMY  1997   with bilateral oophorectomy  . BREAST BIOPSY Right 1975   multiple biopsies done  . BREAST BIOPSY Left    multiple done  . BREAST BIOPSY Right 2016   stereo- neg. FC changes  . BREAST SURGERY Left    lump  removed. Unsure of date  . BREAST SURGERY Right    lump removed. Unsure of date  . COLONOSCOPY  2010   Dr. Vira Agar  . LAPAROSCOPIC APPENDECTOMY N/A 12/21/2018   Procedure: APPENDECTOMY LAPAROSCOPIC CONVERTED TO OPEN;  Surgeon: Benjamine Sprague, DO;  Location: [facility] ORS;  Service: General;  Laterality: N/A;  . TONSILLECTOMY     Family History  Problem Relation Age of Onset  . Hyperlipidemia Mother   . Thyroid disease Mother   . Hypertension Mother   . Colon cancer Father   . Stroke Father   . Heart disease Father        myocardial infarction  . Breast cancer Neg Hx    Social History   Socioeconomic History  . Marital status: Married    Spouse name: Not on file  . Number of children: 2  . Years of education: Not on file  . Highest education level: Not on file  Occupational History  . Not on file  Tobacco Use  . Smoking status: Former Smoker    Packs/day: 1.00    Years: 30.00    Pack years: 30.00    Types: Cigarettes    Quit date: 04/26/2004    Years since quitting: 15.7  . Smokeless tobacco: Never Used  Vaping Use  . Vaping Use: Never used  Substance and Sexual Activity  . Alcohol use: Yes  Alcohol/week: 0.0 standard drinks    Comment: occassional wine every other day  . Drug use: No  . Sexual activity: Not on file  Other Topics Concern  . Not on file  Social History Narrative   She is married, has two children. Works at [employer] Strain:   . Difficulty of Paying Living Expenses:   Food Insecurity:   . Worried About Charity fundraiser in the Last Year:   . Arboriculturist in the Last Year:   Transportation Needs:   . Film/video editor (Medical):   Marland Kitchen Lack of Transportation (Non-Medical):   Physical Activity:   . Days of Exercise per Week:   . Minutes of Exercise per Session:   Stress:   . Feeling of Stress :   Social Connections:   . Frequency of Communication with Friends  and Family:   . Frequency of Social Gatherings with Friends and Family:   . Attends Religious Services:   . Active Member of Clubs or Organizations:   . Attends Archivist Meetings:   Marland Kitchen Marital Status:     Outpatient Encounter Medications as of 01/10/2020  Medication Sig  . Calcium Carbonate-Vitamin D (CALCIUM 600-D) 600-400 MG-UNIT per tablet Take 1 tablet by mouth daily.   . Cholecalciferol (VITAMIN D) 2000 UNITS tablet Take 2,000 Units by mouth daily.  Marland Kitchen denosumab (PROLIA) 60 MG/ML SOSY injection Inject 60 mg into the skin every 6 (six) months.   Marland Kitchen ibuprofen (ADVIL) 800 MG tablet Take 1 tablet (800 mg total) by mouth every 8 (eight) hours as needed for mild pain or moderate pain.  . Ipratropium-Albuterol (COMBIVENT IN) Inhale 1 puff into the lungs daily as needed (shortness of breath). PT STATES SHE HAS NOT USED IN A VERY LONG TIME  . nitrofurantoin, macrocrystal-monohydrate, (MACROBID) 100 MG capsule Take 1 capsule (100 mg total) by mouth 2 (two) times daily.  Marland Kitchen omeprazole (PRILOSEC) 20 MG capsule TAKE 1 CAPSULE BY MOUTH ONCE DAILY  . pravastatin (PRAVACHOL) 10 MG tablet TAKE 1 TABLET BY MOUTH MONDAY  WEDNESDAYAND FRIDAY  . Probiotic Product (PROBIOTIC DAILY PO) Take 1 capsule by mouth daily.  . rosuvastatin (CRESTOR) 5 MG tablet Take 1 tablet (5 mg total) by mouth every morning.  . traMADol (ULTRAM) 50 MG tablet Take 1 tablet (50 mg total) by mouth every 12 (twelve) hours as needed.   No facility-administered encounter medications on file as of 01/10/2020.    Review of Systems  Constitutional: Negative for appetite change and unexpected weight change.  HENT: Negative for congestion and sinus pressure.   Eyes: Negative for pain and visual disturbance.  Respiratory: Negative for cough, chest tightness and shortness of breath.   Cardiovascular: Negative for chest pain and leg swelling.  Gastrointestinal: Negative for abdominal pain, diarrhea, nausea and vomiting.    Genitourinary: Positive for dysuria.       No vaginal symptoms.    Musculoskeletal: Negative for joint swelling and myalgias.  Skin: Negative for color change and rash.       Open leg wound - skin tear - right leg.    Neurological: Negative for dizziness, light-headedness and headaches.  Hematological: Negative for adenopathy. Does not bruise/bleed easily.  Psychiatric/Behavioral: Negative for agitation and dysphoric mood.       Objective:    Physical Exam Vitals reviewed.  Constitutional:      General: She is not in acute  distress.    Appearance: Normal appearance.  HENT:     Head: Normocephalic and atraumatic.     Right Ear: External ear normal.     Left Ear: External ear normal.  Eyes:     General: No scleral icterus.       Right eye: No discharge.        Left eye: No discharge.     Conjunctiva/sclera: Conjunctivae normal.  Neck:     Thyroid: No thyromegaly.  Cardiovascular:     Rate and Rhythm: Normal rate and regular rhythm.  Pulmonary:     Effort: No respiratory distress.     Breath sounds: Normal breath sounds. No wheezing.     Comments: Breasts:  She declined due to hematoma s/p fall.   Abdominal:     General: Bowel sounds are normal.     Palpations: Abdomen is soft.     Tenderness: There is no abdominal tenderness.  Musculoskeletal:        General: No swelling or tenderness.     Cervical back: Neck supple. No tenderness.  Lymphadenopathy:     Cervical: No cervical adenopathy.  Skin:    Findings: No erythema or rash.  Neurological:     Mental Status: She is alert.     BP 122/72   Pulse 97   Temp (!) 97.3 F (36.3 C)   Resp 16   Ht 5\' 5"  (1.651 m)   Wt 195 lb (88.5 kg)   LMP 07/20/1995   SpO2 98%   BMI 32.45 kg/m  Wt Readings from Last 3 Encounters:  01/10/20 195 lb (88.5 kg)  01/05/20 185 lb (83.9 kg)  10/10/19 194 lb (88 kg)     Lab Results  Component Value Date   WBC 5.3 02/02/2019   HGB 13.8 02/02/2019   HCT 42.3 02/02/2019   PLT  279.0 02/02/2019   GLUCOSE 96 10/31/2019   CHOL 220 (H) 10/31/2019   TRIG 104.0 10/31/2019   HDL 56.50 10/31/2019   LDLDIRECT 153.0 07/17/2015   LDLCALC 143 (H) 10/31/2019   ALT 25 12/07/2019   AST 14 12/07/2019   NA 141 10/31/2019   K 4.0 10/31/2019   CL 104 10/31/2019   CREATININE 0.61 10/31/2019   BUN 12 10/31/2019   CO2 32 10/31/2019   TSH 1.83 02/02/2019       Assessment & Plan:   Problem List Items Addressed This Visit    GERD (gastroesophageal reflux disease)    Upper symptoms controlled on prilosec.       GOLD GRADE A COPD with Centrilobular Emphysema    Has seen pulmonary.  Breathing stable.       Health care maintenance    Physical today 01/10/20.  Mammogram 01/18/19 - Birads I.  Bilateral diagnostic mammogram 10/2019 - Birads II.  Colonoscopy 11/2014.  Received notice due.  Plans to call and schedule.        Hypercholesterolemia    On crestor.  Tolerating.  Low cholesterol diet and exercise.  Follow lipid panel and liver function tests.        Relevant Orders   CBC with Differential/Platelet   Hepatic function panel   Lipid panel   TSH   Basic metabolic panel   Laceration of skin of knee   Relevant Orders   Ambulatory referral to Wound Clinic   Leg wound, right    Skin tear and open wound.  No increased erythema.  Discussed referral to wound clinic.  Palpitations    Occasional palpitations.  Previous EKG - last visit - SR with no acute ischemic changes.  Discussed referral to cardiology.  She feels things are stable. Desires to hold on cardiology referral at this time.  Follow.        Stress    Overall handling things well.  Does not feel needs any further intervention.  Follow.        UTI (urinary tract infection)    Symptoms and urine dip appear to be c/w UTI.  Treat with macrobid.  Await culture results.        Relevant Medications   nitrofurantoin, macrocrystal-monohydrate, (MACROBID) 100 MG capsule    Other Visit Diagnoses    Routine  general medical examination at a health care facility    -  Primary   Dysuria       Relevant Orders   POCT urinalysis dipstick (Completed)   Urine Microscopic (Completed)   Urine Culture (Completed)       Einar Pheasant, MD

## 2020-01-11 ENCOUNTER — Encounter: Payer: Self-pay | Admitting: Internal Medicine

## 2020-01-11 DIAGNOSIS — S81809D Unspecified open wound, unspecified lower leg, subsequent encounter: Secondary | ICD-10-CM

## 2020-01-12 LAB — URINE CULTURE
MICRO NUMBER:: 10647143
SPECIMEN QUALITY:: ADEQUATE

## 2020-01-12 NOTE — Telephone Encounter (Signed)
Order placed for referral to wound care in [facility]

## 2020-01-16 ENCOUNTER — Encounter: Payer: Self-pay | Admitting: Internal Medicine

## 2020-01-16 DIAGNOSIS — S81801A Unspecified open wound, right lower leg, initial encounter: Secondary | ICD-10-CM | POA: Insufficient documentation

## 2020-01-16 NOTE — Assessment & Plan Note (Signed)
Symptoms and urine dip appear to be c/w UTI.  Treat with macrobid.  Await culture results.

## 2020-01-16 NOTE — Assessment & Plan Note (Signed)
Has seen pulmonary.  Breathing stable.   

## 2020-01-16 NOTE — Assessment & Plan Note (Addendum)
On crestor.  Tolerating.  Low cholesterol diet and exercise.  Follow lipid panel and liver function tests.

## 2020-01-16 NOTE — Assessment & Plan Note (Signed)
Upper symptoms controlled on prilosec.  

## 2020-01-16 NOTE — Assessment & Plan Note (Signed)
Overall handling things well.  Does not feel needs any further intervention.  Follow.   

## 2020-01-16 NOTE — Assessment & Plan Note (Signed)
Occasional palpitations.  Previous EKG - last visit - SR with no acute ischemic changes.  Discussed referral to cardiology.  She feels things are stable. Desires to hold on cardiology referral at this time.  Follow.

## 2020-01-16 NOTE — Assessment & Plan Note (Signed)
Skin tear and open wound.  No increased erythema.  Discussed referral to wound clinic.

## 2020-01-16 NOTE — Assessment & Plan Note (Signed)
Physical today 01/10/20.  Mammogram 01/18/19 - Birads I.  Bilateral diagnostic mammogram 10/2019 - Birads II.  Colonoscopy 11/2014.  Received notice due.  Plans to call and schedule.

## 2020-01-20 ENCOUNTER — Encounter: Payer: Self-pay | Admitting: Internal Medicine

## 2020-01-20 ENCOUNTER — Other Ambulatory Visit: Payer: Self-pay | Admitting: Internal Medicine

## 2020-01-20 ENCOUNTER — Other Ambulatory Visit: Payer: Self-pay

## 2020-01-20 MED ORDER — OMEPRAZOLE 20 MG PO CPDR: 20.0000 mg | DELAYED_RELEASE_CAPSULE | Freq: Every day | ORAL | 1 refills | Status: DC

## 2020-01-26 ENCOUNTER — Encounter: Payer: Self-pay | Admitting: Internal Medicine

## 2020-01-26 ENCOUNTER — Other Ambulatory Visit: Payer: Self-pay

## 2020-01-26 ENCOUNTER — Telehealth: Payer: Self-pay | Admitting: Internal Medicine

## 2020-01-26 ENCOUNTER — Ambulatory Visit: Payer: Medicare HMO | Admitting: Internal Medicine

## 2020-01-26 VITALS — BP 132/70 | HR 72 | Temp 98.1°F | Ht 64.0 in | Wt 194.8 lb

## 2020-01-26 DIAGNOSIS — S81811A Laceration without foreign body, right lower leg, initial encounter: Secondary | ICD-10-CM

## 2020-01-26 MED ORDER — COLLAGENASE 250 UNIT/GM EX OINT: 1.0000 "application " | TOPICAL_OINTMENT | Freq: Every day | CUTANEOUS | 0 refills | Status: DC

## 2020-01-26 NOTE — Progress Notes (Addendum)
Subjective:     Patient ID: Alexis Reed, female    DOB: [birth year], 74 y.o.   MRN: [id number]  Chief Complaint  Patient presents with  . Consult    HPI: The patient is a 74 y.o. female here for right leg wound  Patient states that about 3 weeks ago she experienced a skin tear to her right lower leg.  She states she was on her porch when she tripped over a stool and scraped her leg.  She has been using a nonstick pad with gauze and wrapping with Ace daily for the past 3 weeks.  She states she leaves the wound to air dry for about 30 minutes a day.  She has noticed minimal improvement to the wound.  She occasionally uses Polysporin on the wound.  She originally had pain to the wound bed but is now improved and occasionally has stinging pain.  She has low drainage.  She denies fever/chills, purulent drainage, increased warmth or erythema to the wound.  She states that the swelling in her leg is improving as she can now bend her knee with more ease.  Review of Systems  All other systems reviewed and are negative.    has a past medical history of Arthritis, Benign breast lumps, COPD (chronic obstructive pulmonary disease) ([place]), Fibroids, GERD (gastroesophageal reflux disease), Hypercholesterolemia, Osteoporosis, and Personal history of tobacco use, presenting hazards to health (08/07/2015).  has a past surgical history that includes Abdominal hysterectomy (1997); Tonsillectomy; Breast surgery (Left); Breast surgery (Right); Colonoscopy (2010); laparoscopic appendectomy (N/A, 12/21/2018); Breast biopsy (Right, 1975); Breast biopsy (Left); and Breast biopsy (Right, 2016).  reports that she quit smoking about 15 years ago. Her smoking use included cigarettes. She has a 30.00 pack-year smoking history. She has never used smokeless tobacco. Objective:   Vital Signs BP 132/70 (BP Location: Right Arm, Patient Position: Sitting, Cuff Size: Large)   Pulse 72   Temp 98.1 F (36.7 C) (Oral)   Ht 5'  4" (1.626 m)   Wt 194 lb 12.8 oz (88.4 kg)   LMP 07/20/1995   SpO2 96%   BMI 33.44 kg/m  Vital Signs and Nursing Note Reviewed Physical Exam Skin:    Comments: Devitalized tissue throughout the wound bed Scattered epithelial and granulation tissue present No erythema Minimal Non pitting edema to the right lower extremity Wound measures 6.0 x 8.0 x 0.2 cm         Pre debridement                                                Post debridement     Assessment/Plan:     ICD-10-CM   1. Skin tear of right lower leg without complication, initial encounter  S81.811A     Assessment: Skin tear of right lower leg  Patient presents after experiencing a skin tear of the right lower leg almost 4 weeks ago. The area has devitalized tissue and this was debrided in office with scissors, pickups and curette. I think at this time she would benefit from Santyl ointment due to devitalized tissue in the wound bed and it is not improving on its own with current wound care.  I recommended using it daily with dressing changes.  I will see her back in 1 week.  She will be going out of town in a couple  of weeks and so I may switch to a bordered foam dressing to minimize dressing changes as she will be walking often.  Plan -In office sharp debridement and cleaned with wound cleanser and gauze -In office dressing change with Vaseline, nonstick pad and Ace wrap -Santyl ointment daily with dressing changes of nonstick pad and ace wrap -Follow-up in 1 week   Boyd Kerbs, DO 01/26/2020, 10:57 AM

## 2020-01-26 NOTE — Patient Instructions (Signed)
Alexis Reed It was a pleasure meeting you today.  Please follow the instructions below for your wound care  1) Clean the wound and place santyl ointment on the wound bed 2) followed by non stick pad 3) cover with ace bandage  4) you can do this up to twice a day 5) shower as usual 6) please keep wound covered when not showering  For a back up plan you can use vaseline on the wound bed followed by non stick pad and wrap with an ace bandage You do not need to use the polysporin anymore for now   Please follow up with me in 1 weeks.  Call us at [phone] with any questions or concerns

## 2020-01-26 NOTE — Telephone Encounter (Signed)
Thank you.  I called and gave additional information. Insurance approved medication.  Nothing else to do at this time.

## 2020-01-26 NOTE — Telephone Encounter (Signed)
Kayla from [facility] called to find out wound size and other specifics for insurance to make sure enough is being supplied for them to cover the cost of prescription. Please call Kayla back at [phone] to advise.

## 2020-01-27 ENCOUNTER — Telehealth: Payer: Self-pay | Admitting: Internal Medicine

## 2020-01-27 NOTE — Telephone Encounter (Signed)
Rejection Reason - Patient went elsewhere - Patient went to see a Dr. Kalman Shan who is at [facility]" [place] said about 3 hours ago

## 2020-02-02 ENCOUNTER — Other Ambulatory Visit: Payer: Self-pay

## 2020-02-02 ENCOUNTER — Ambulatory Visit (INDEPENDENT_AMBULATORY_CARE_PROVIDER_SITE_OTHER): Payer: Medicare HMO | Admitting: Internal Medicine

## 2020-02-02 ENCOUNTER — Encounter: Payer: Self-pay | Admitting: Internal Medicine

## 2020-02-02 VITALS — BP 121/62 | HR 78 | Temp 97.9°F

## 2020-02-02 DIAGNOSIS — S81811D Laceration without foreign body, right lower leg, subsequent encounter: Secondary | ICD-10-CM

## 2020-02-02 NOTE — Progress Notes (Signed)
° °  Subjective:     Patient ID: Alexis Reed, female    DOB: 12-11-1945, 74 y.o.   MRN: [id number]  Chief Complaint  Patient presents with   Follow-up of right leg wound    HPI: The patient is a 74 y.o. female here for follow-up of her right leg wound  The patient reports using Santyl daily with dressing changes.  She reports improvement in the wound appearance and size.  She reports minimal drainage.  She denies acute pain, purulent drainage, increased warmth or erythema to the wound.  She has no complaints today.  Review of Systems  All other systems reviewed and are negative.    has a past medical history of Arthritis, Benign breast lumps, COPD (chronic obstructive pulmonary disease) ([place]), Fibroids, GERD (gastroesophageal reflux disease), Hypercholesterolemia, Osteoporosis, and Personal history of tobacco use, presenting hazards to health (08/07/2015).  has a past surgical history that includes Abdominal hysterectomy (1997); Tonsillectomy; Breast surgery (Left); Breast surgery (Right); Colonoscopy (2010); laparoscopic appendectomy (N/A, 12/21/2018); Breast biopsy (Right, 1975); Breast biopsy (Left); and Breast biopsy (Right, 2016).  reports that she quit smoking about 15 years ago. Her smoking use included cigarettes. She has a 30.00 pack-year smoking history. She has never used smokeless tobacco. Objective:   Vital Signs BP (!) 121/62 (BP Location: Left Arm, Patient Position: Sitting, Cuff Size: Large)    Pulse 78    Temp 97.9 F (36.6 C) (Oral)    LMP 07/20/1995    SpO2 96%  Vital Signs and Nursing Note Reviewed Physical Exam Skin:    Comments: Devitalized tissue in the wound bed Scattered epithelial and granulation tissue present No erythema Wound measures 4.0 x 8.0 x 0.2 cm         Assessment/Plan:     ICD-10-CM   1. Skin tear of right lower leg without complication, subsequent encounter  S81.811D    Assessment: Skin tear of right lower leg  Patient has used  Santyl for 1 week with daily dressing changes.  The wound has shown significant improvement.  Overall size has decreased and there is more epithelialization.  There is still some devitalized tissue and this was debrided in office with wound cleanser, gauze, scissors and pickups.  I recommended continuing to use Santyl daily with dressing changes.  Once she has a good granulation bed she can switch to Vaseline and keep covered with a nonstick pad and wrap.  Plan -Santyl daily with dressing changes for likely 1 more week -Can switch to Vaseline daily with dressing changes once she has a good granulation wound bed -Follow-up in 2 to 3 weeks  Boyd Kerbs, DO 02/02/2020, 1:18 PM

## 2020-02-03 ENCOUNTER — Encounter (HOSPITAL_BASED_OUTPATIENT_CLINIC_OR_DEPARTMENT_OTHER): Payer: Medicare HMO | Admitting: Internal Medicine

## 2020-02-13 ENCOUNTER — Ambulatory Visit: Payer: Medicare HMO | Admitting: Physician Assistant

## 2020-02-13 ENCOUNTER — Other Ambulatory Visit: Payer: Self-pay

## 2020-02-13 ENCOUNTER — Telehealth (INDEPENDENT_AMBULATORY_CARE_PROVIDER_SITE_OTHER): Payer: Medicare HMO | Admitting: Internal Medicine

## 2020-02-13 ENCOUNTER — Telehealth: Payer: Self-pay | Admitting: Internal Medicine

## 2020-02-13 ENCOUNTER — Other Ambulatory Visit (INDEPENDENT_AMBULATORY_CARE_PROVIDER_SITE_OTHER): Payer: Medicare HMO

## 2020-02-13 DIAGNOSIS — F439 Reaction to severe stress, unspecified: Secondary | ICD-10-CM

## 2020-02-13 DIAGNOSIS — R300 Dysuria: Secondary | ICD-10-CM | POA: Diagnosis not present

## 2020-02-13 DIAGNOSIS — N3000 Acute cystitis without hematuria: Secondary | ICD-10-CM | POA: Diagnosis not present

## 2020-02-13 DIAGNOSIS — E7800 Pure hypercholesterolemia, unspecified: Secondary | ICD-10-CM

## 2020-02-13 LAB — POCT URINALYSIS DIPSTICK
Bilirubin, UA: NEGATIVE
Glucose, UA: POSITIVE — AB
Nitrite, UA: POSITIVE
Protein, UA: POSITIVE — AB
Spec Grav, UA: 1.015 (ref 1.010–1.025)
Urobilinogen, UA: 1 E.U./dL
pH, UA: 5 (ref 5.0–8.0)

## 2020-02-13 LAB — URINALYSIS, MICROSCOPIC ONLY

## 2020-02-13 MED ORDER — NITROFURANTOIN MONOHYD MACRO 100 MG PO CAPS: 100.0000 mg | ORAL_CAPSULE | Freq: Two times a day (BID) | ORAL | 0 refills | Status: DC

## 2020-02-13 NOTE — Telephone Encounter (Signed)
Orders placed for urine. Pt scheduled with Dr Nicki Reaper per Santiago Glad

## 2020-02-13 NOTE — Telephone Encounter (Signed)
Patinet thinks she has a UTI and would like to give a urine sample. No available appointments today in office at this time.

## 2020-02-13 NOTE — Progress Notes (Signed)
Patient ID: Alexis Reed, female   DOB: Nov 25, 1945, 74 y.o.   MRN: [id number]   Virtual Visit via video Note  This visit type was conducted due to national recommendations for restrictions regarding the COVID-19 pandemic (e.g. social distancing).  This format is felt to be most appropriate for this patient at this time.  All issues noted in this document were discussed and addressed.  No physical exam was performed (except for noted visual exam findings with Video Visits).   I connected with Alexis Reed by a video enabled telemedicine application and verified that I am speaking with the correct person using two identifiers. Location patient: home Location provider: work  Persons participating in the virtual visit: patient, provider  The limitations, risks, security and privacy concerns of performing an evaluation and management service by video and the availability of in person appointments have been discussed.   It has also been discussed with the patient that there may be a patient responsible charge related to this service. The patient expressed understanding and agreed to proceed.   Reason for visit: work in appt  HPI: Noticed this am - dysuria.  Some pressure.  Increased urinary frequency and urgency.  No fever.  Eating.  No increased abdominal pain.  No vomiting.  No diarrhea.  No vaginal symptoms.  Feels c/w previous urinary tract infections.    ROS: See pertinent positives and negatives per HPI.  Past Medical History:  Diagnosis Date  . Arthritis   . Benign breast lumps    multiple lumps biopsy and remove x's 5 last being 1979 by Dr. Sharlet Salina  . COPD (chronic obstructive pulmonary disease) ([facility])    MILD-RARELY EVER HAS TO USE COMBIVENT INHALER  . Fibroids    s/p hysterectomy  . GERD (gastroesophageal reflux disease)   . Hypercholesterolemia   . Osteoporosis   . Personal history of tobacco use, presenting hazards to health 08/07/2015    Past Surgical History:    Procedure Laterality Date  . ABDOMINAL HYSTERECTOMY  1997   with bilateral oophorectomy  . BREAST BIOPSY Right 1975   multiple biopsies done  . BREAST BIOPSY Left    multiple done  . BREAST BIOPSY Right 2016   stereo- neg. FC changes  . BREAST SURGERY Left    lump removed. Unsure of date  . BREAST SURGERY Right    lump removed. Unsure of date  . COLONOSCOPY  2010   Dr. Vira Agar  . LAPAROSCOPIC APPENDECTOMY N/A 12/21/2018   Procedure: APPENDECTOMY LAPAROSCOPIC CONVERTED TO OPEN;  Surgeon: Benjamine Sprague, DO;  Location: [facility] ORS;  Service: General;  Laterality: N/A;  . TONSILLECTOMY      Family History  Problem Relation Age of Onset  . Hyperlipidemia Mother   . Thyroid disease Mother   . Hypertension Mother   . Colon cancer Father   . Stroke Father   . Heart disease Father        myocardial infarction  . Breast cancer Neg Hx     SOCIAL HX: reviewed.    Current Outpatient Medications:  .  Calcium Carbonate-Vitamin D (CALCIUM 600-D) 600-400 MG-UNIT per tablet, Take 1 tablet by mouth daily. , Disp: , Rfl:  .  Cholecalciferol (VITAMIN D) 2000 UNITS tablet, Take 2,000 Units by mouth daily., Disp: , Rfl:  .  collagenase (SANTYL) ointment, Apply 1 application topically daily., Disp: 30 g, Rfl: 0 .  denosumab (PROLIA) 60 MG/ML SOSY injection, Inject 60 mg into the skin every 6 (six) months. , Disp: ,  Rfl:  .  ibuprofen (ADVIL) 800 MG tablet, Take 1 tablet (800 mg total) by mouth every 8 (eight) hours as needed for mild pain or moderate pain., Disp: 30 tablet, Rfl: 0 .  Ipratropium-Albuterol (COMBIVENT IN), Inhale 1 puff into the lungs daily as needed (shortness of breath). PT STATES SHE HAS NOT USED IN A VERY LONG TIME, Disp: , Rfl:  .  nitrofurantoin, macrocrystal-monohydrate, (MACROBID) 100 MG capsule, Take 1 capsule (100 mg total) by mouth 2 (two) times daily., Disp: 10 capsule, Rfl: 0 .  omeprazole (PRILOSEC) 20 MG capsule, Take 1 capsule (20 mg total) by mouth daily., Disp: 90  capsule, Rfl: 1 .  pravastatin (PRAVACHOL) 10 MG tablet, TAKE 1 TABLET BY MOUTH MONDAY  WEDNESDAYAND FRIDAY, Disp: 15 tablet, Rfl: 0 .  Probiotic Product (PROBIOTIC DAILY PO), Take 1 capsule by mouth daily., Disp: , Rfl:  .  traMADol (ULTRAM) 50 MG tablet, Take 1 tablet (50 mg total) by mouth every 12 (twelve) hours as needed., Disp: 10 tablet, Rfl: 0  EXAM:  GENERAL: alert, oriented, appears well and in no acute distress  HEENT: atraumatic, conjunttiva clear, no obvious abnormalities on inspection of external nose and ears  NECK: normal movements of the head and neck  LUNGS: on inspection no signs of respiratory distress, breathing rate appears normal, no obvious gross SOB, gasping or wheezing  CV: no obvious cyanosis  PSYCH/NEURO: pleasant and cooperative, no obvious depression or anxiety, speech and thought processing grossly intact  ASSESSMENT AND PLAN:  Discussed the following assessment and plan:  UTI (urinary tract infection) Symptoms and urine dip appear to be c/w UTI.  Treat with macrobid.  Urine sent for culture.  Stay hydrated.  Follow symptoms.    Stress Overall appears to be doing well.  Follow.     Meds ordered this encounter  Medications  . nitrofurantoin, macrocrystal-monohydrate, (MACROBID) 100 MG capsule    Sig: Take 1 capsule (100 mg total) by mouth 2 (two) times daily.    Dispense:  10 capsule    Refill:  0     I discussed the assessment and treatment plan with the patient. The patient was provided an opportunity to ask questions and all were answered. The patient agreed with the plan and demonstrated an understanding of the instructions.   The patient was advised to call back or seek an in-person evaluation if the symptoms worsen or if the condition fails to improve as anticipated.   Einar Pheasant, MD

## 2020-02-15 ENCOUNTER — Other Ambulatory Visit: Payer: Self-pay | Admitting: Internal Medicine

## 2020-02-15 DIAGNOSIS — R319 Hematuria, unspecified: Secondary | ICD-10-CM

## 2020-02-15 LAB — URINE CULTURE
MICRO NUMBER:: 10775977
SPECIMEN QUALITY:: ADEQUATE

## 2020-02-15 NOTE — Progress Notes (Signed)
Order placed for f/u urinalysis 

## 2020-02-16 ENCOUNTER — Encounter: Payer: Self-pay | Admitting: Internal Medicine

## 2020-02-20 ENCOUNTER — Ambulatory Visit (INDEPENDENT_AMBULATORY_CARE_PROVIDER_SITE_OTHER): Payer: Medicare HMO | Admitting: Internal Medicine

## 2020-02-20 ENCOUNTER — Other Ambulatory Visit: Payer: Self-pay

## 2020-02-20 ENCOUNTER — Encounter: Payer: Self-pay | Admitting: Internal Medicine

## 2020-02-20 VITALS — BP 140/80 | HR 85 | Temp 97.9°F

## 2020-02-20 DIAGNOSIS — S81811D Laceration without foreign body, right lower leg, subsequent encounter: Secondary | ICD-10-CM | POA: Diagnosis not present

## 2020-02-20 NOTE — Progress Notes (Signed)
° °  Subjective:     Patient ID: Alexis Reed, female    DOB: 1946-01-22, 74 y.o.   MRN: [id number]  Chief Complaint  Patient presents with   Follow-up of right leg wound    HPI: The patient is a 74 y.o. female here for follow-up of right leg wound.  The patient reports improvement in appearance and size of her wound.  She uses the Santyl on the open wound bed and covers it with a Band-Aid.  She denies drainage, acute pain, or increased warmth or erythema to the wound.    Review of Systems  All other systems reviewed and are negative.    has a past medical history of Arthritis, Benign breast lumps, COPD (chronic obstructive pulmonary disease) ([place]), Fibroids, GERD (gastroesophageal reflux disease), Hypercholesterolemia, Osteoporosis, and Personal history of tobacco use, presenting hazards to health (08/07/2015).  has a past surgical history that includes Abdominal hysterectomy (1997); Tonsillectomy; Breast surgery (Left); Breast surgery (Right); Colonoscopy (2010); laparoscopic appendectomy (N/A, 12/21/2018); Breast biopsy (Right, 1975); Breast biopsy (Left); and Breast biopsy (Right, 2016).  reports that she quit smoking about 15 years ago. Her smoking use included cigarettes. She has a 30.00 pack-year smoking history. She has never used smokeless tobacco. Objective:   Vital Signs BP 140/80 (BP Location: Left Arm, Patient Position: Sitting, Cuff Size: Large)    Pulse 85    Temp 97.9 F (36.6 C) (Oral)    LMP 07/20/1995    SpO2 96%  Vital Signs and Nursing Note Reviewed Physical Exam Skin:    Comments: Right lower leg wound measures: 3.0 x 1.5 x 0.2 cm Devitalized tissue in the wound bed No swelling, increased warmth or erythema Epithelialized tissue        Assessment/Plan:     ICD-10-CM   1. Skin tear of right lower leg without complication, subsequent encounter  S81.811D    Assessment: Skin tear of right lower leg  The wound has improved in appearance and size since last  clinic visit.  There are no signs of infection today.  Patient continues to use Santyl daily on the devitalized portion of the wound bed.  I recommended she continue to do this until there is a good granulation bed and then she can switch to Vaseline.  I debrided the devitalized tissue today with curette, gauze and wound cleanser.  I recommended she continue Vaseline on the surrounding epithelialized tissue that is healed from the wound.  Plan -Santyl daily with dressing changes until granulation tissue noted in the wound bed and then can switch to Vaseline -In office sharp debridement -Follow-up in 3 weeks  Boyd Kerbs, DO 02/20/2020, 11:54 AM

## 2020-02-26 ENCOUNTER — Encounter: Payer: Self-pay | Admitting: Internal Medicine

## 2020-02-26 NOTE — Assessment & Plan Note (Signed)
Symptoms and urine dip appear to be c/w UTI.  Treat with macrobid.  Urine sent for culture.  Stay hydrated.  Follow symptoms.

## 2020-02-26 NOTE — Assessment & Plan Note (Signed)
Overall appears to be doing well.  Follow.  

## 2020-02-29 ENCOUNTER — Other Ambulatory Visit: Payer: Self-pay | Admitting: Internal Medicine

## 2020-03-12 ENCOUNTER — Ambulatory Visit: Payer: Medicare HMO | Admitting: Internal Medicine

## 2020-03-12 ENCOUNTER — Other Ambulatory Visit: Payer: Self-pay | Admitting: Orthopedic Surgery

## 2020-03-12 DIAGNOSIS — M25561 Pain in right knee: Secondary | ICD-10-CM

## 2020-03-13 ENCOUNTER — Ambulatory Visit
Admission: RE | Admit: 2020-03-13 | Discharge: 2020-03-13 | Disposition: A | Discharge status: Home / Self Care | Payer: Medicare HMO | Source: Ambulatory Visit | Attending: Orthopedic Surgery | Admitting: Orthopedic Surgery

## 2020-03-13 ENCOUNTER — Other Ambulatory Visit: Payer: Self-pay

## 2020-03-13 DIAGNOSIS — M25561 Pain in right knee: Secondary | ICD-10-CM

## 2020-03-13 IMAGING — MR MR KNEE*R* W/O CM
4 of 6 series · 23 of 40 positions shown · non-contrast
Comparison: None.

CLINICAL DATA: Right lateral knee pain for 3 months, prior fall.

EXAM:
MRI OF THE RIGHT KNEE WITHOUT CONTRAST
TECHNIQUE: Multiplanar, multisequence MR imaging of the knee was performed. No
intravenous contrast was administered.

[Series 4: T1 · coronal · 4.0mm · 0.29mm/px · 3 of 26 slices shown]
[im 6/26]
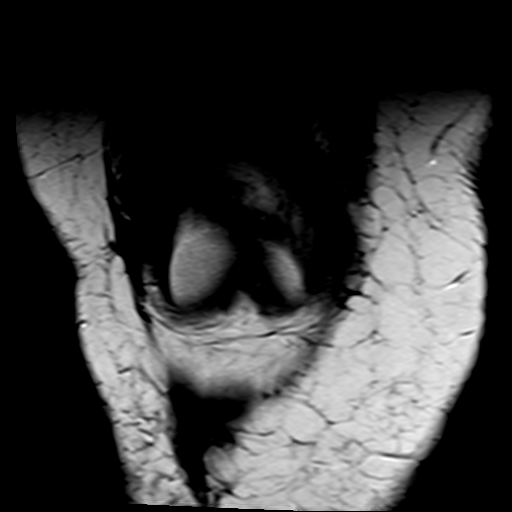
[im 16/26]
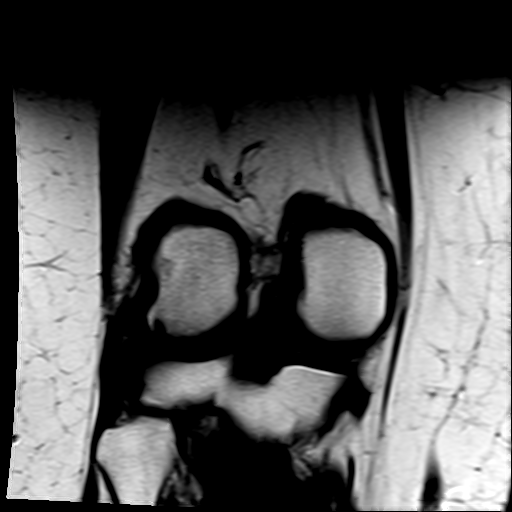
[im 26/26]
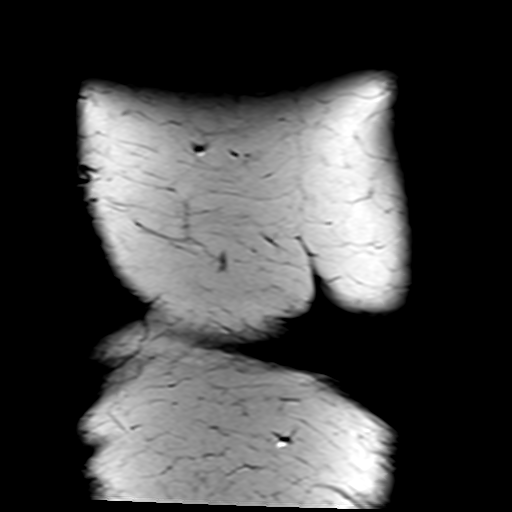

[Series 5: T2 fat-sat · coronal · 4.0mm · 0.59mm/px · 6 of 26 slices shown (1 of 2)]
[im 1/26]
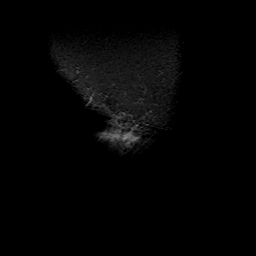
[im 6/26]
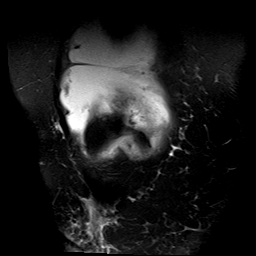
[im 11/26]
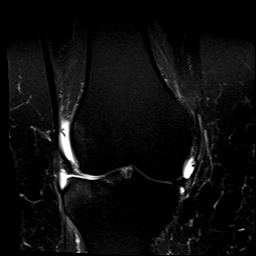
[im 16/26]
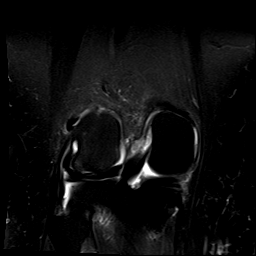
[im 21/26]
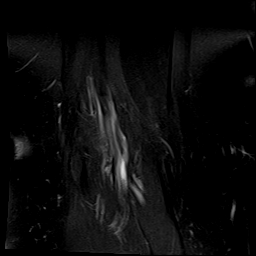
[im 26/26]
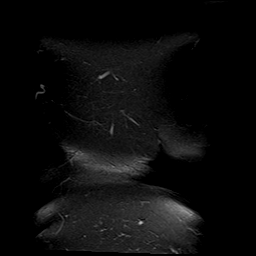

[Series 7: PD fat-sat · sagittal · 3.0mm · 0.29mm/px · 8 of 32 slices shown]
[im 1/32]
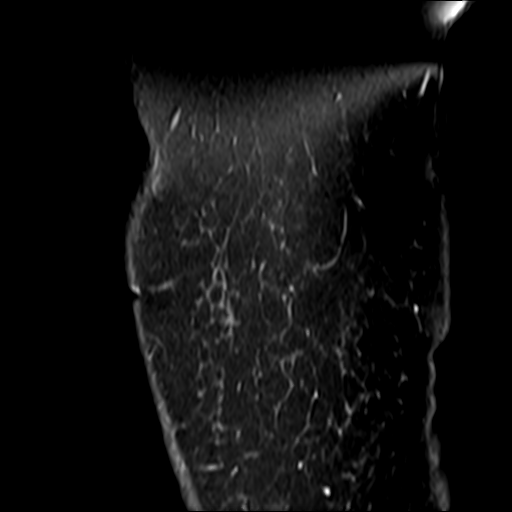
[im 5/32]
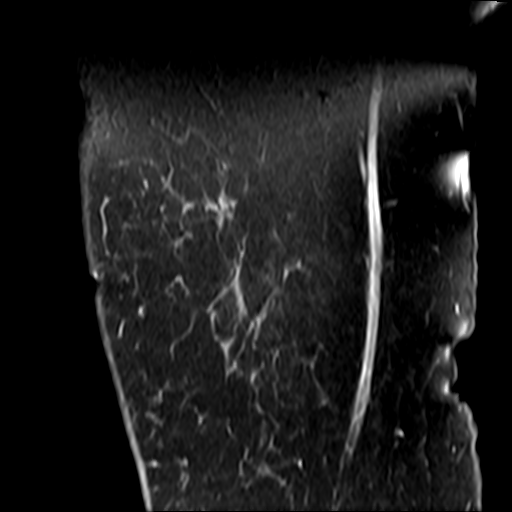
[im 9/32]
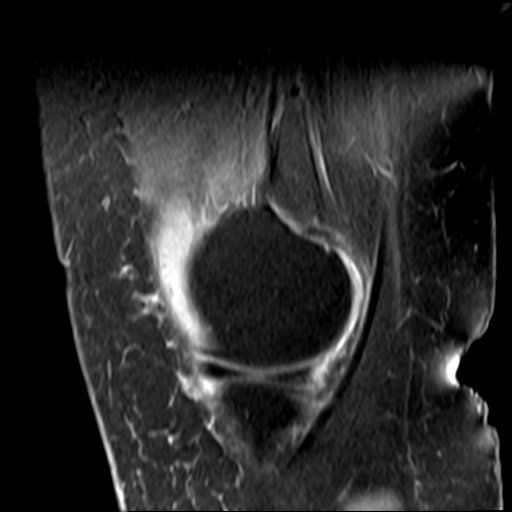
[im 14/32]
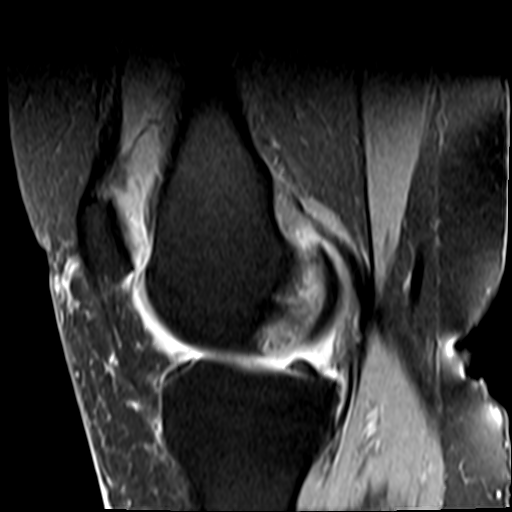
[im 18/32]
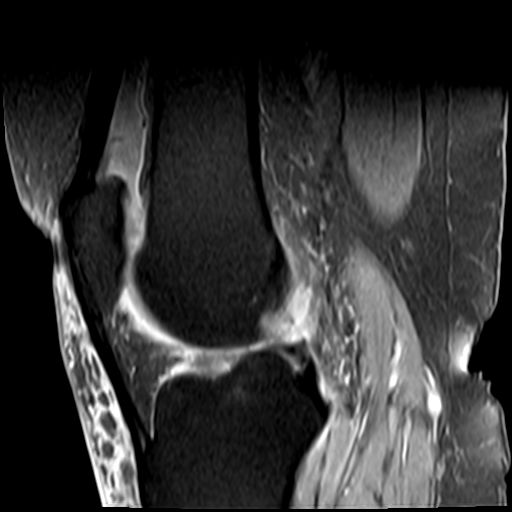
[im 23/32]
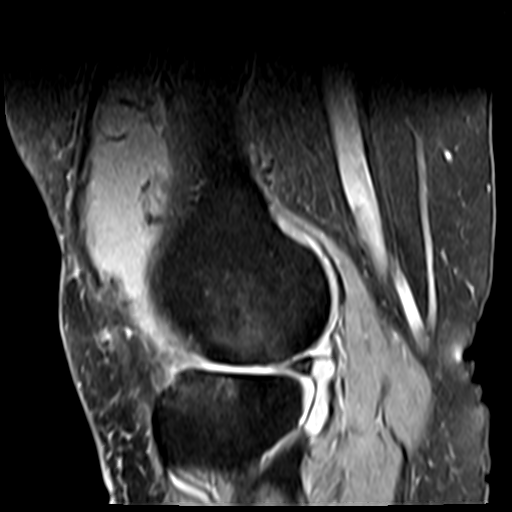
[im 27/32]
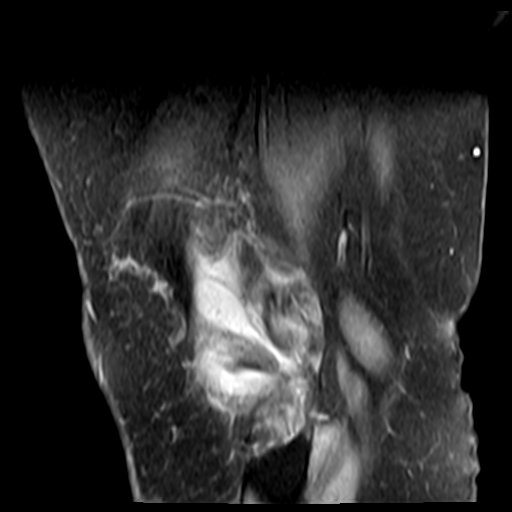
[im 32/32]
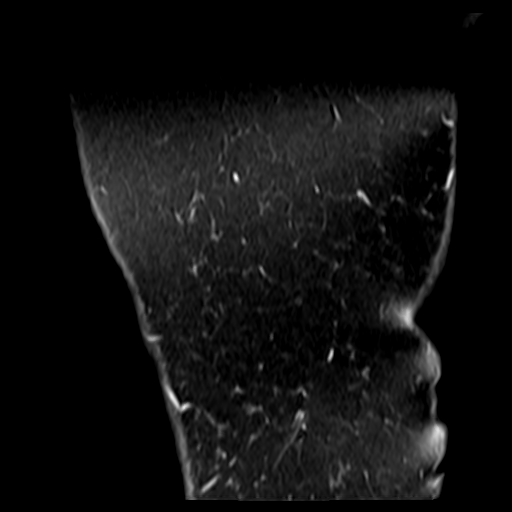

[Series 8: T2 fat-sat · sagittal · 3.0mm · 0.29mm/px · 6 of 32 slices shown (2 of 2)]
[im 1/32]
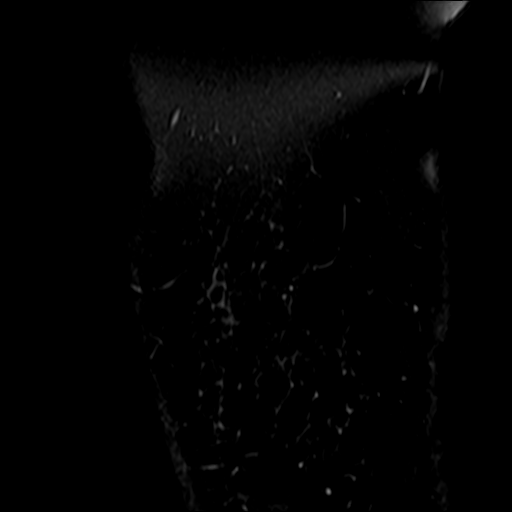
[im 5/32]
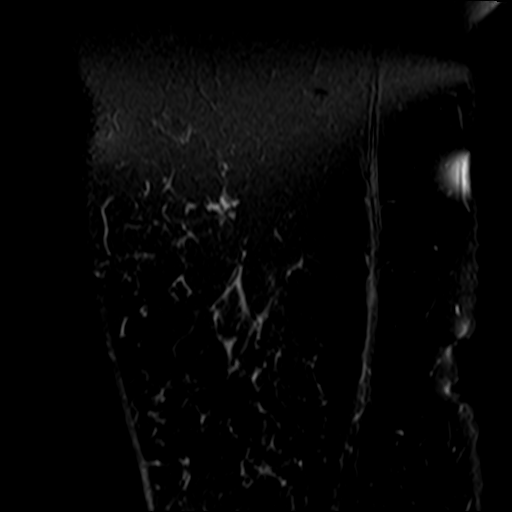
[im 9/32]
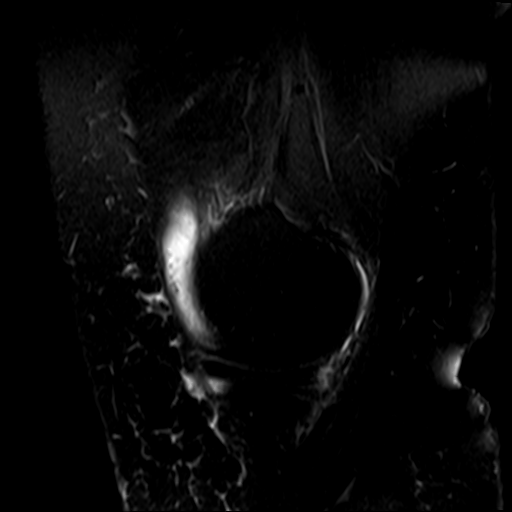
[im 14/32]
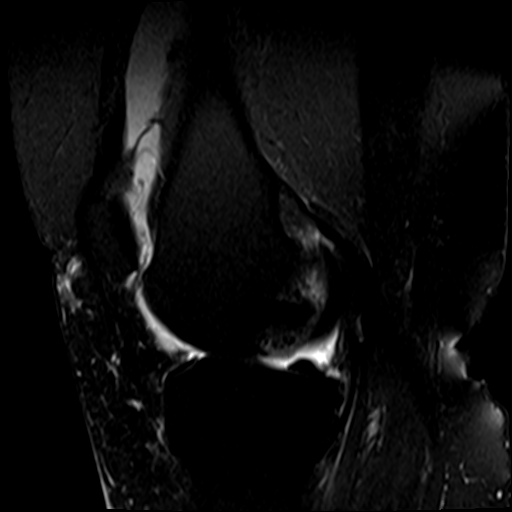
[im 18/32]
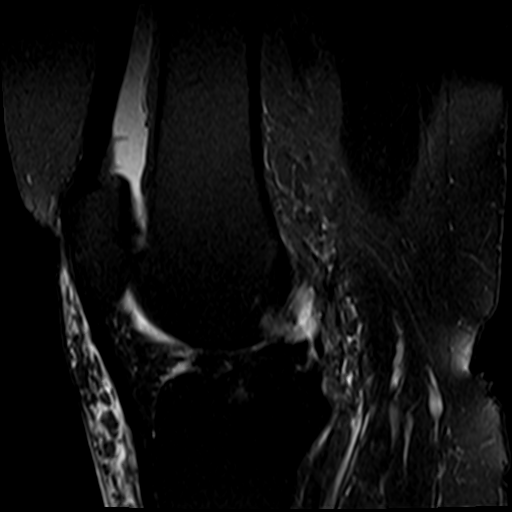
[im 27/32]
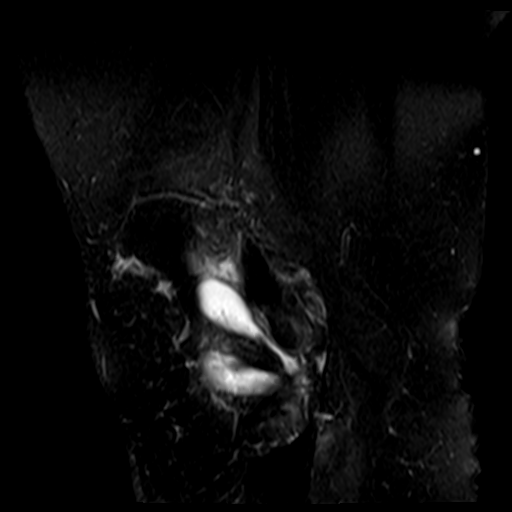

[23 of 40 positions shown; findings below may reference images not displayed]

FINDINGS: MENISCI

Medial meniscus:  Unremarkable

Lateral meniscus: Degenerative tearing of the anterior horn lateral
meniscus extending into the free edge of the midbody.

LIGAMENTS

Cruciates:  Unremarkable

Collaterals:  Unremarkable

CARTILAGE

Patellofemoral: Moderate chondral thinning in the femoral trochlear
groove.

Medial: Moderate degenerative chondral thinning. Mild marginal
spurring.

Lateral: Prominent chondral thinning in the lateral compartment,
with chondral irregularity especially along the lateral femoral
condyle, and mild subcortical marrow edema in the lateral femoral
condyle and lateral tibial plateau.

Joint:  Moderate to large joint effusion with mild synovitis.

Popliteal Fossa: Minimal infiltrative edema, otherwise unremarkable.

Extensor Mechanism:  Unremarkable

Bones: No significant extra-articular osseous abnormalities
identified.

Other: Mild prepatellar subcutaneous edema.
IMPRESSION: 1. Degenerative tearing of the anterior horn lateral meniscus
extending into the free edge of the midbody.
2. Variable degree of degenerative chondral thinning, most severe in
the lateral compartment.
3. Moderate to large joint effusion with mild synovitis.
4. Mild prepatellar subcutaneous edema.

## 2020-03-21 ENCOUNTER — Other Ambulatory Visit: Payer: Self-pay

## 2020-03-21 ENCOUNTER — Ambulatory Visit (INDEPENDENT_AMBULATORY_CARE_PROVIDER_SITE_OTHER): Payer: Medicare HMO | Admitting: Internal Medicine

## 2020-03-21 ENCOUNTER — Encounter: Payer: Self-pay | Admitting: Internal Medicine

## 2020-03-21 VITALS — BP 125/85 | HR 73 | Temp 98.3°F

## 2020-03-21 DIAGNOSIS — S81811D Laceration without foreign body, right lower leg, subsequent encounter: Secondary | ICD-10-CM

## 2020-03-21 NOTE — Progress Notes (Signed)
   Subjective:     Patient ID: Alexis Reed, female    DOB: 1946-03-03, 74 y.o.   MRN: [id number]  Chief Complaint  Patient presents with  . Follow-up of right leg wound.    HPI: The patient is a 74 y.o. female here for follow-up of right leg wound.  Patient states that her right leg wound has healed up and maintains the area covered with a Band-Aid.  She has some dry areas to the healed site.  She denies any drainage, open wounds, increased warmth or erythema to the right leg.  She has no complaints today  Review of Systems  All other systems reviewed and are negative.    has a past medical history of Arthritis, Benign breast lumps, COPD (chronic obstructive pulmonary disease) ([place]), Fibroids, GERD (gastroesophageal reflux disease), Hypercholesterolemia, Osteoporosis, and Personal history of tobacco use, presenting hazards to health (08/07/2015).  has a past surgical history that includes Abdominal hysterectomy (1997); Tonsillectomy; Breast surgery (Left); Breast surgery (Right); Colonoscopy (2010); laparoscopic appendectomy (N/A, 12/21/2018); Breast biopsy (Right, 1975); Breast biopsy (Left); and Breast biopsy (Right, 2016).  reports that she quit smoking about 15 years ago. Her smoking use included cigarettes. She has a 30.00 pack-year smoking history. She has never used smokeless tobacco. Objective:   Vital Signs BP 125/85 (BP Location: Left Arm, Patient Position: Sitting, Cuff Size: Large)   Pulse 73   Temp 98.3 F (36.8 C) (Oral)   LMP 07/20/1995   SpO2 96%  Vital Signs and Nursing Note Reviewed Physical Exam Skin:    Comments: No swelling, increased warmth or erythema Epithelialized tissue            Assessment/Plan:     ICD-10-CM   1. Skin tear of right lower leg without complication, subsequent encounter  S81.811D    Assessment: Skin tear of right lower leg  The wound has healed and has epithelialized tissue throughout.  There are some areas that are dry and  flaky and I recommended using Vaseline daily on these areas.  For scar treatment I recommended Mederma or any silicone-based cream.  Also recommended using sunscreen if going outside.  Patient may follow-up as needed.  Plan -Vaseline as needed -Mederma as needed  Boyd Kerbs, DO 03/21/2020, 12:05 PM

## 2020-04-11 ENCOUNTER — Other Ambulatory Visit (INDEPENDENT_AMBULATORY_CARE_PROVIDER_SITE_OTHER): Payer: Medicare HMO

## 2020-04-11 ENCOUNTER — Other Ambulatory Visit: Payer: Self-pay

## 2020-04-11 DIAGNOSIS — R319 Hematuria, unspecified: Secondary | ICD-10-CM

## 2020-04-11 DIAGNOSIS — E7800 Pure hypercholesterolemia, unspecified: Secondary | ICD-10-CM | POA: Diagnosis not present

## 2020-04-11 LAB — URINALYSIS, ROUTINE W REFLEX MICROSCOPIC
Bilirubin Urine: NEGATIVE
Hgb urine dipstick: NEGATIVE
Ketones, ur: NEGATIVE
Leukocytes,Ua: NEGATIVE
Nitrite: NEGATIVE
RBC / HPF: NONE SEEN (ref 0–?)
Specific Gravity, Urine: 1.025 (ref 1.000–1.030)
Total Protein, Urine: NEGATIVE
Urine Glucose: NEGATIVE
Urobilinogen, UA: 0.2 (ref 0.0–1.0)
pH: 6 (ref 5.0–8.0)

## 2020-04-11 LAB — LIPID PANEL
Cholesterol: 184 mg/dL (ref 0–200)
HDL: 51.4 mg/dL (ref >39.00)
LDL Cholesterol: 105 mg/dL — ABNORMAL HIGH (ref 0–99)
NonHDL: 132.7
Total CHOL/HDL Ratio: 4
Triglycerides: 138 mg/dL (ref 0.0–149.0)
VLDL: 27.6 mg/dL (ref 0.0–40.0)

## 2020-04-11 LAB — HEPATIC FUNCTION PANEL
ALT: 11 U/L (ref 0–35)
AST: 12 U/L (ref 0–37)
Albumin: 3.9 g/dL (ref 3.5–5.2)
Alkaline Phosphatase: 66 U/L (ref 39–117)
Bilirubin, Direct: 0.1 mg/dL (ref 0.0–0.3)
Total Bilirubin: 0.5 mg/dL (ref 0.2–1.2)
Total Protein: 6.1 g/dL (ref 6.0–8.3)

## 2020-04-11 LAB — CBC WITH DIFFERENTIAL/PLATELET
Basophils Absolute: 0 10*3/uL (ref 0.0–0.1)
Basophils Relative: 0.7 % (ref 0.0–3.0)
Eosinophils Absolute: 0.1 10*3/uL (ref 0.0–0.7)
Eosinophils Relative: 1.6 % (ref 0.0–5.0)
HCT: 41.7 % (ref 36.0–46.0)
Hemoglobin: 13.7 g/dL (ref 12.0–15.0)
Lymphocytes Relative: 26.9 % (ref 12.0–46.0)
Lymphs Abs: 1.1 10*3/uL (ref 0.7–4.0)
MCHC: 33 g/dL (ref 30.0–36.0)
MCV: 94.9 fl (ref 78.0–100.0)
Monocytes Absolute: 0.4 10*3/uL (ref 0.1–1.0)
Monocytes Relative: 10 % (ref 3.0–12.0)
Neutro Abs: 2.6 10*3/uL (ref 1.4–7.7)
Neutrophils Relative %: 60.8 % (ref 43.0–77.0)
Platelets: 256 10*3/uL (ref 150.0–400.0)
RBC: 4.39 Mil/uL (ref 3.87–5.11)
RDW: 13.8 % (ref 11.5–15.5)
WBC: 4.2 10*3/uL (ref 4.0–10.5)

## 2020-04-11 LAB — BASIC METABOLIC PANEL
BUN: 15 mg/dL (ref 6–23)
CO2: 32 mEq/L (ref 19–32)
Calcium: 9.1 mg/dL (ref 8.4–10.5)
Chloride: 103 mEq/L (ref 96–112)
Creatinine, Ser: 0.64 mg/dL (ref 0.40–1.20)
GFR: 91.19 mL/min (ref >60.00)
Glucose, Bld: 100 mg/dL — ABNORMAL HIGH (ref 70–99)
Potassium: 4 mEq/L (ref 3.5–5.1)
Sodium: 141 mEq/L (ref 135–145)

## 2020-04-11 LAB — TSH: TSH: 1.32 u[IU]/mL (ref 0.35–4.50)

## 2020-04-13 ENCOUNTER — Ambulatory Visit (INDEPENDENT_AMBULATORY_CARE_PROVIDER_SITE_OTHER): Payer: Medicare HMO | Admitting: Internal Medicine

## 2020-04-13 ENCOUNTER — Other Ambulatory Visit: Payer: Self-pay

## 2020-04-13 VITALS — BP 116/72 | HR 83 | Temp 98.3°F | Resp 16 | Ht 64.0 in | Wt 195.0 lb

## 2020-04-13 DIAGNOSIS — N3000 Acute cystitis without hematuria: Secondary | ICD-10-CM

## 2020-04-13 DIAGNOSIS — Z0000 Encounter for general adult medical examination without abnormal findings: Secondary | ICD-10-CM | POA: Diagnosis not present

## 2020-04-13 DIAGNOSIS — Z23 Encounter for immunization: Secondary | ICD-10-CM

## 2020-04-13 DIAGNOSIS — J438 Other emphysema: Secondary | ICD-10-CM

## 2020-04-13 DIAGNOSIS — S81011D Laceration without foreign body, right knee, subsequent encounter: Secondary | ICD-10-CM

## 2020-04-13 DIAGNOSIS — E7800 Pure hypercholesterolemia, unspecified: Secondary | ICD-10-CM | POA: Diagnosis not present

## 2020-04-13 DIAGNOSIS — R300 Dysuria: Secondary | ICD-10-CM | POA: Diagnosis not present

## 2020-04-13 DIAGNOSIS — F439 Reaction to severe stress, unspecified: Secondary | ICD-10-CM

## 2020-04-13 DIAGNOSIS — K219 Gastro-esophageal reflux disease without esophagitis: Secondary | ICD-10-CM

## 2020-04-13 MED ORDER — PRAVASTATIN SODIUM 10 MG PO TABS
ORAL_TABLET | ORAL | 1 refills | Status: DC
Start: 2020-04-13 — End: 2020-08-30

## 2020-04-13 NOTE — Progress Notes (Signed)
Patient ID: Alexis Reed, female   DOB: 12-31-1945, 74 y.o.   MRN: [id number]   Subjective:    Patient ID: Alexis Reed, female    DOB: Oct 22, 1945, 74 y.o.   MRN: [id number]  HPI This visit occurred during the SARS-CoV-2 public health emergency.  Safety protocols were in place, including screening questions prior to the visit, additional usage of staff PPE, and extensive cleaning of exam room while observing appropriate contact time as indicated for disinfecting solutions.  Patient here for her physical exam.  She has been seeing ortho - f/u knee pain.  MRI - degenerative tearing of the anterior horn lateral meniscus, joint effusion, degenerative chondral thinning.  S/p knee aspiration and visco - 3 injection procedure.  Still with pain.  Previous wound - healed well.  Was followed by wound clinic.  Tries to stay active.  No chest pain or sob reported.  No abdominal pain or bowel change reported.  Blood pressure doing well. Just noticed sensation - feels like uti.  No vaginal itching or discharge reported.    Past Medical History:  Diagnosis Date  . Arthritis   . Benign breast lumps    multiple lumps biopsy and remove x's 5 last being 1979 by Dr. Sharlet Salina  . COPD (chronic obstructive pulmonary disease) (HCC)    MILD-RARELY EVER HAS TO USE COMBIVENT INHALER  . Fibroids    s/p hysterectomy  . GERD (gastroesophageal reflux disease)   . Hypercholesterolemia   . Osteoporosis   . Personal history of tobacco use, presenting hazards to health 08/07/2015   Past Surgical History:  Procedure Laterality Date  . ABDOMINAL HYSTERECTOMY  1997   with bilateral oophorectomy  . BREAST BIOPSY Right 1975   multiple biopsies done  . BREAST BIOPSY Left    multiple done  . BREAST BIOPSY Right 2016   stereo- neg. FC changes  . BREAST SURGERY Left    lump removed. Unsure of date  . BREAST SURGERY Right    lump removed. Unsure of date  . COLONOSCOPY  2010   Dr. Vira Agar  . LAPAROSCOPIC APPENDECTOMY  N/A 12/21/2018   Procedure: APPENDECTOMY LAPAROSCOPIC CONVERTED TO OPEN;  Surgeon: Benjamine Sprague, DO;  Location: [facility] ORS;  Service: General;  Laterality: N/A;  . TONSILLECTOMY     Family History  Problem Relation Age of Onset  . Hyperlipidemia Mother   . Thyroid disease Mother   . Hypertension Mother   . Colon cancer Father   . Stroke Father   . Heart disease Father        myocardial infarction  . Breast cancer Neg Hx    Social History   Socioeconomic History  . Marital status: Married    Spouse name: Not on file  . Number of children: 2  . Years of education: Not on file  . Highest education level: Not on file  Occupational History  . Not on file  Tobacco Use  . Smoking status: Former Smoker    Packs/day: 1.00    Years: 30.00    Pack years: 30.00    Types: Cigarettes    Quit date: 04/26/2004    Years since quitting: 15.9  . Smokeless tobacco: Never Used  Vaping Use  . Vaping Use: Never used  Substance and Sexual Activity  . Alcohol use: Yes    Alcohol/week: 0.0 standard drinks    Comment: occassional wine every other day  . Drug use: No  . Sexual activity: Not on file  Other Topics  Concern  . Not on file  Social History Narrative   She is married, has two children. Works at [employer] Strain:   . Difficulty of Paying Living Expenses: Not on file  Food Insecurity:   . Worried About Charity fundraiser in the Last Year: Not on file  . Ran Out of Food in the Last Year: Not on file  Transportation Needs:   . Lack of Transportation (Medical): Not on file  . Lack of Transportation (Non-Medical): Not on file  Physical Activity:   . Days of Exercise per Week: Not on file  . Minutes of Exercise per Session: Not on file  Stress:   . Feeling of Stress : Not on file  Social Connections:   . Frequency of Communication with Friends and Family: Not on file  . Frequency of Social Gatherings  with Friends and Family: Not on file  . Attends Religious Services: Not on file  . Active Member of Clubs or Organizations: Not on file  . Attends Archivist Meetings: Not on file  . Marital Status: Not on file    Outpatient Encounter Medications as of 04/13/2020  Medication Sig  . Calcium Carbonate-Vitamin D (CALCIUM 600-D) 600-400 MG-UNIT per tablet Take 1 tablet by mouth daily.   . Cholecalciferol (VITAMIN D) 2000 UNITS tablet Take 2,000 Units by mouth daily.  . collagenase (SANTYL) ointment Apply 1 application topically daily.  Marland Kitchen denosumab (PROLIA) 60 MG/ML SOSY injection Inject 60 mg into the skin every 6 (six) months.   Marland Kitchen ibuprofen (ADVIL) 800 MG tablet Take 1 tablet (800 mg total) by mouth every 8 (eight) hours as needed for mild pain or moderate pain.  . Ipratropium-Albuterol (COMBIVENT IN) Inhale 1 puff into the lungs daily as needed (shortness of breath). PT STATES SHE HAS NOT USED IN A VERY LONG TIME  . meloxicam (MOBIC) 15 MG tablet Take 15 mg by mouth daily.  Marland Kitchen omeprazole (PRILOSEC) 20 MG capsule Take 1 capsule (20 mg total) by mouth daily.  . pravastatin (PRAVACHOL) 10 MG tablet Take one tablet 5 days per week.  . Probiotic Product (PROBIOTIC DAILY PO) Take 1 capsule by mouth daily.  . traMADol (ULTRAM) 50 MG tablet Take 1 tablet (50 mg total) by mouth every 12 (twelve) hours as needed.  . [DISCONTINUED] nitrofurantoin, macrocrystal-monohydrate, (MACROBID) 100 MG capsule Take 1 capsule (100 mg total) by mouth 2 (two) times daily.  . [DISCONTINUED] pravastatin (PRAVACHOL) 10 MG tablet TAKE 1 TABLET BY MOUTH MONDAY  WEDNESDAYAND FRIDAY   No facility-administered encounter medications on file as of 04/13/2020.    Review of Systems  Constitutional: Negative for appetite change and unexpected weight change.  HENT: Negative for congestion and sinus pressure.   Eyes: Negative for pain and visual disturbance.  Respiratory: Negative for cough, chest tightness and shortness  of breath.   Cardiovascular: Negative for chest pain, palpitations and leg swelling.  Gastrointestinal: Negative for abdominal pain, diarrhea, nausea and vomiting.  Genitourinary: Positive for dysuria. Negative for difficulty urinating.  Musculoskeletal: Negative for joint swelling and myalgias.       Knee pain as outlined.    Skin: Negative for color change and rash.  Neurological: Negative for dizziness, light-headedness and headaches.  Hematological: Negative for adenopathy. Does not bruise/bleed easily.  Psychiatric/Behavioral: Negative for agitation and dysphoric mood.       Objective:    Physical Exam Constitutional:  General: She is not in acute distress.    Appearance: Normal appearance. She is well-developed.  HENT:     Head: Normocephalic and atraumatic.     Right Ear: External ear normal.     Left Ear: External ear normal.  Eyes:     General: No scleral icterus.       Right eye: No discharge.        Left eye: No discharge.     Conjunctiva/sclera: Conjunctivae normal.  Neck:     Thyroid: No thyromegaly.  Cardiovascular:     Rate and Rhythm: Normal rate and regular rhythm.  Pulmonary:     Effort: No tachypnea, accessory muscle usage or respiratory distress.     Breath sounds: Normal breath sounds. No decreased breath sounds or wheezing.  Chest:     Breasts:        Right: No inverted nipple, mass, nipple discharge or tenderness (no axillary adenopathy).        Left: No inverted nipple, mass, nipple discharge or tenderness (no axilarry adenopathy).  Abdominal:     General: Bowel sounds are normal.     Palpations: Abdomen is soft.     Tenderness: There is no abdominal tenderness.  Musculoskeletal:        General: No swelling or tenderness.     Cervical back: Neck supple. No tenderness.  Lymphadenopathy:     Cervical: No cervical adenopathy.  Skin:    Findings: No erythema or rash.  Neurological:     Mental Status: She is alert and oriented to person,  place, and time.  Psychiatric:        Mood and Affect: Mood normal.        Behavior: Behavior normal.     BP 116/72   Pulse 83   Temp 98.3 F (36.8 C) (Oral)   Resp 16   Ht 5\' 4"  (1.626 m)   Wt 195 lb (88.5 kg)   LMP 07/20/1995   SpO2 98%   BMI 33.47 kg/m  Wt Readings from Last 3 Encounters:  04/13/20 195 lb (88.5 kg)  02/13/20 194 lb (88 kg)  01/26/20 194 lb 12.8 oz (88.4 kg)     Lab Results  Component Value Date   WBC 4.2 04/11/2020   HGB 13.7 04/11/2020   HCT 41.7 04/11/2020   PLT 256.0 04/11/2020   GLUCOSE 100 (H) 04/11/2020   CHOL 184 04/11/2020   TRIG 138.0 04/11/2020   HDL 51.40 04/11/2020   LDLDIRECT 153.0 07/17/2015   LDLCALC 105 (H) 04/11/2020   ALT 11 04/11/2020   AST 12 04/11/2020   NA 141 04/11/2020   K 4.0 04/11/2020   CL 103 04/11/2020   CREATININE 0.64 04/11/2020   BUN 15 04/11/2020   CO2 32 04/11/2020   TSH 1.32 04/11/2020    MR KNEE RIGHT WO CONTRAST  Result Date: 03/14/2020 CLINICAL DATA:  Right lateral knee pain for 3 months, prior fall. EXAM: MRI OF THE RIGHT KNEE WITHOUT CONTRAST TECHNIQUE: Multiplanar, multisequence MR imaging of the knee was performed. No intravenous contrast was administered. COMPARISON:  None. FINDINGS: MENISCI Medial meniscus:  Unremarkable Lateral meniscus: Degenerative tearing of the anterior horn lateral meniscus extending into the free edge of the midbody. LIGAMENTS Cruciates:  Unremarkable Collaterals:  Unremarkable CARTILAGE Patellofemoral: Moderate chondral thinning in the femoral trochlear groove. Medial: Moderate degenerative chondral thinning. Mild marginal spurring. Lateral: Prominent chondral thinning in the lateral compartment, with chondral irregularity especially along the lateral femoral condyle, and mild subcortical marrow edema  in the lateral femoral condyle and lateral tibial plateau. Joint:  Moderate to large joint effusion with mild synovitis. Popliteal Fossa: Minimal infiltrative edema, otherwise  unremarkable. Extensor Mechanism:  Unremarkable Bones: No significant extra-articular osseous abnormalities identified. Other: Mild prepatellar subcutaneous edema. IMPRESSION: 1. Degenerative tearing of the anterior horn lateral meniscus extending into the free edge of the midbody. 2. Variable degree of degenerative chondral thinning, most severe in the lateral compartment. 3. Moderate to large joint effusion with mild synovitis. 4. Mild prepatellar subcutaneous edema. Electronically Signed   By: Van Clines M.D.   On: 03/14/2020 08:46       Assessment & Plan:   Problem List Items Addressed This Visit    UTI (urinary tract infection)    Symptoms as outlined.  Urinalysis c/w uti. Treat with macrobid.  Follow.        Stress    Overall appears to be handling things well.  Follow.       Laceration of skin of knee    Well healed wound - below knee.  Follow.       Hypercholesterolemia    On crestor.  5 days per week.  Follow for tolerance. Low cholesterol diet and exercise.  Follow lipid panel and liver function tests.        Relevant Medications   pravastatin (PRAVACHOL) 10 MG tablet   Other Relevant Orders   Hepatic function panel   Lipid panel   Basic metabolic panel   Health care maintenance    Physical today 04/13/20.  Mammogram 01/18/19 - Birads I.  Bilateral diagnostic mammogram 10/2019 - birads II.  Colonoscopy 11/2014.  Will need to call for f/u colonoscopy.        GOLD GRADE A COPD with Centrilobular Emphysema    Has seen pulmonary.  Breathing stable.       GERD (gastroesophageal reflux disease)    No upper symptoms reported. On omeprazole.         Other Visit Diagnoses    Routine general medical examination at a health care facility    -  Primary   Dysuria       Relevant Orders   Urine Culture (Completed)   Urinalysis, Routine w reflex microscopic (Completed)   Need for immunization against influenza       Relevant Orders   Flu Vaccine QUAD High Dose(Fluad)  (Completed)       Einar Pheasant, MD

## 2020-04-13 NOTE — Assessment & Plan Note (Addendum)
Physical today 04/13/20.  Mammogram 01/18/19 - Birads I.  Bilateral diagnostic mammogram 10/2019 - birads II.  Colonoscopy 11/2014.  Will need to call for f/u colonoscopy.

## 2020-04-14 LAB — URINALYSIS, ROUTINE W REFLEX MICROSCOPIC
Bilirubin Urine: NEGATIVE
Glucose, UA: NEGATIVE
Hgb urine dipstick: NEGATIVE
Hyaline Cast: NONE SEEN /LPF
Ketones, ur: NEGATIVE
Nitrite: NEGATIVE
Protein, ur: NEGATIVE
Specific Gravity, Urine: 1.021 (ref 1.001–1.03)
Squamous Epithelial / HPF: NONE SEEN /HPF (ref <=5)
pH: <=5 (ref 5.0–8.0)

## 2020-04-15 ENCOUNTER — Encounter: Payer: Self-pay | Admitting: Internal Medicine

## 2020-04-15 LAB — URINE CULTURE
MICRO NUMBER:: 11021305
SPECIMEN QUALITY:: ADEQUATE

## 2020-04-16 ENCOUNTER — Encounter: Payer: Self-pay | Admitting: Internal Medicine

## 2020-04-16 ENCOUNTER — Other Ambulatory Visit: Payer: Self-pay | Admitting: Internal Medicine

## 2020-04-16 DIAGNOSIS — Z87440 Personal history of urinary (tract) infections: Secondary | ICD-10-CM

## 2020-04-16 MED ORDER — NITROFURANTOIN MONOHYD MACRO 100 MG PO CAPS
100.0000 mg | ORAL_CAPSULE | Freq: Two times a day (BID) | ORAL | 0 refills | Status: DC
Start: 2020-04-16 — End: 2020-05-07

## 2020-04-16 NOTE — Progress Notes (Signed)
rx sent in for macrobid

## 2020-04-18 NOTE — Telephone Encounter (Signed)
See message from 04/16/20 ABX sent.

## 2020-04-19 NOTE — Telephone Encounter (Signed)
Order placed for urology referral.  

## 2020-04-21 ENCOUNTER — Encounter: Payer: Self-pay | Admitting: Internal Medicine

## 2020-04-21 NOTE — Assessment & Plan Note (Signed)
Has seen pulmonary.  Breathing stable.   

## 2020-04-21 NOTE — Assessment & Plan Note (Signed)
Symptoms as outlined.  Urinalysis c/w uti. Treat with macrobid.  Follow.

## 2020-04-21 NOTE — Assessment & Plan Note (Addendum)
On crestor.  5 days per week.  Follow for tolerance. Low cholesterol diet and exercise.  Follow lipid panel and liver function tests.

## 2020-04-21 NOTE — Assessment & Plan Note (Signed)
Overall appears to be handling things well.  Follow.  ?

## 2020-04-21 NOTE — Assessment & Plan Note (Signed)
Well healed wound - below knee.  Follow.

## 2020-04-21 NOTE — Assessment & Plan Note (Signed)
No upper symptoms reported.  On omeprazole.  

## 2020-04-30 DIAGNOSIS — M1711 Unilateral primary osteoarthritis, right knee: Secondary | ICD-10-CM | POA: Insufficient documentation

## 2020-04-30 DIAGNOSIS — S83271A Complex tear of lateral meniscus, current injury, right knee, initial encounter: Secondary | ICD-10-CM | POA: Insufficient documentation

## 2020-05-02 ENCOUNTER — Encounter: Payer: Self-pay | Admitting: Internal Medicine

## 2020-05-04 ENCOUNTER — Other Ambulatory Visit: Payer: Self-pay | Admitting: Surgery

## 2020-05-04 ENCOUNTER — Other Ambulatory Visit: Payer: Self-pay | Admitting: Oncology

## 2020-05-04 NOTE — Progress Notes (Signed)
  Pulmonary Nodule Clinic Telephone Note [place]   Received referral from [facility] low-dose CT screening.  She unfortunately no longer meets criteria.  HPI: Mrs. Galyean is an 74 year old female with past medical history significant for COPD and emphysema, GERD, osteoporosis, UTIs, hypercholesterolemia, obesity who was recently followed by the low-dose CT screening program.  Unfortunately, her quit date is beyond 15 years and she no longer meets criteria.  Her last imaging is from 04/21/2019 completed by Dr. Mortimer Fries which showed tiny pulmonary nodules.   Review and Recommendations: I personally reviewed all patient's previous imaging.  I do not believe she needs any additional follow-up at this time.  Social History:   Tobacco Use: Medium Risk  . Smoking Tobacco Use: Former Smoker  . Smokeless Tobacco Use: Never Used   High risk factors include: History of heavy smoking, exposure to asbestos, radium or uranium, personal family history of lung cancer, older age, sex (females greater than males), race (black and native [place] greater than weight), marginal speculation, upper lobe location, multiplicity (less than 5 nodules increases risk for malignancy) and emphysema and/or pulmonary fibrosis.   This recommendation follows the consensus statement: Guidelines for Management of Incidental Pulmonary Nodules Detected on CT Images: From the Fleischner Society 2017; Radiology 2017; 284:228-243.    Disposition: No additional CT scans needed from imaging I was able to locate in epic.  Last scan I saw was from October 2020.  Faythe Casa, NP 05/04/2020 4:13 PM

## 2020-05-04 NOTE — Telephone Encounter (Signed)
Pt scheduled  

## 2020-05-07 ENCOUNTER — Encounter: Payer: Self-pay | Admitting: Surgery

## 2020-05-07 ENCOUNTER — Other Ambulatory Visit
Admission: RE | Admit: 2020-05-07 | Discharge: 2020-05-07 | Disposition: A | Discharge status: Home / Self Care | Payer: Medicare HMO | Source: Ambulatory Visit | Attending: Surgery | Admitting: Surgery

## 2020-05-07 ENCOUNTER — Encounter
Admission: RE | Admit: 2020-05-07 | Discharge: 2020-05-07 | Disposition: A | Discharge status: Home / Self Care | Payer: Medicare HMO | Source: Ambulatory Visit | Attending: Surgery | Admitting: Surgery

## 2020-05-07 ENCOUNTER — Ambulatory Visit: Payer: Medicare HMO | Admitting: Family

## 2020-05-07 ENCOUNTER — Encounter: Payer: Self-pay | Admitting: Family

## 2020-05-07 ENCOUNTER — Other Ambulatory Visit: Payer: Self-pay

## 2020-05-07 VITALS — BP 130/72 | HR 85 | Temp 97.9°F | Wt 191.8 lb

## 2020-05-07 DIAGNOSIS — R300 Dysuria: Secondary | ICD-10-CM | POA: Diagnosis not present

## 2020-05-07 DIAGNOSIS — Q740 Other congenital malformations of upper limb(s), including shoulder girdle: Secondary | ICD-10-CM | POA: Diagnosis not present

## 2020-05-07 DIAGNOSIS — N3000 Acute cystitis without hematuria: Secondary | ICD-10-CM | POA: Diagnosis not present

## 2020-05-07 DIAGNOSIS — Z20822 Contact with and (suspected) exposure to covid-19: Secondary | ICD-10-CM | POA: Insufficient documentation

## 2020-05-07 DIAGNOSIS — Z01812 Encounter for preprocedural laboratory examination: Secondary | ICD-10-CM | POA: Insufficient documentation

## 2020-05-07 LAB — POCT URINALYSIS DIPSTICK
Blood, UA: NEGATIVE
Glucose, UA: NEGATIVE
Nitrite, UA: POSITIVE
Protein, UA: POSITIVE — AB
Spec Grav, UA: 1.025 (ref 1.010–1.025)
Urobilinogen, UA: 2 E.U./dL — AB
pH, UA: 5 (ref 5.0–8.0)

## 2020-05-07 LAB — URINALYSIS, MICROSCOPIC ONLY

## 2020-05-07 LAB — SARS CORONAVIRUS 2 (TAT 6-24 HRS): SARS Coronavirus 2: NEGATIVE

## 2020-05-07 NOTE — Progress Notes (Signed)
Subjective:    Patient ID: Alexis Reed, female    DOB: Apr 18, 1946, 74 y.o.   MRN: [id number]  CC: Alexis Reed is a 74 y.o. female who presents today for an acute visit.    HPI: Complaints of area over left sided neck which she noticed to be enlarged. First noticed 4 weeks ago, and since feels it has grown in size  Area is nontender. No pain with swallowing, fever, sore throat, cough, congestion.  She had covid 19 booster 3 weeks ago and had noticed area prior too.    Yesterday noted dysuria and suprapubic pressure. No hematuria.   UTI 3 weeks ago and Dr Nicki Reaper prescribed macrobid which she completed. Referral to urology appears canceled.  Has meniscal knee surgery tomorrow and plans to reschedule this appointment.    HISTORY:  Past Medical History:  Diagnosis Date  . Arthritis   . Benign breast lumps    multiple lumps biopsy and remove x's 5 last being 1979 by Dr. Sharlet Salina  . COPD (chronic obstructive pulmonary disease) (HCC)    MILD-RARELY EVER HAS TO USE COMBIVENT INHALER  . Fibroids    s/p hysterectomy  . GERD (gastroesophageal reflux disease)   . Hypercholesterolemia   . Osteoporosis   . Personal history of tobacco use, presenting hazards to health 08/07/2015   Past Surgical History:  Procedure Laterality Date  . ABDOMINAL HYSTERECTOMY  1997   with bilateral oophorectomy  . BREAST BIOPSY Right 1975   multiple biopsies done  . BREAST BIOPSY Left    multiple done  . BREAST BIOPSY Right 2016   stereo- neg. FC changes  . BREAST SURGERY Left    lump removed. Unsure of date  . BREAST SURGERY Right    lump removed. Unsure of date  . COLONOSCOPY  2010   Dr. Vira Agar  . LAPAROSCOPIC APPENDECTOMY N/A 12/21/2018   Procedure: APPENDECTOMY LAPAROSCOPIC CONVERTED TO OPEN;  Surgeon: Benjamine Sprague, DO;  Location: [facility] ORS;  Service: General;  Laterality: N/A;  . TONSILLECTOMY     Family History  Problem Relation Age of Onset  . Hyperlipidemia Mother   . Thyroid  disease Mother   . Hypertension Mother   . Colon cancer Father   . Stroke Father   . Heart disease Father        myocardial infarction  . Breast cancer Neg Hx     Allergies: Boniva [ibandronic acid], Codeine, Fosamax [alendronate sodium], Lipitor [atorvastatin], Meloxicam, and Penicillins Current Outpatient Medications on File Prior to Visit  Medication Sig Dispense Refill  . acetaminophen (TYLENOL) 500 MG tablet Take 1,000 mg by mouth every 6 (six) hours as needed for moderate pain.    . Calcium Carb-Cholecalciferol (CALCIUM 500 +D) 500-400 MG-UNIT TABS Take 1 tablet by mouth daily.    . Cholecalciferol (VITAMIN D) 2000 UNITS tablet Take 2,000 Units by mouth daily.    Marland Kitchen denosumab (PROLIA) 60 MG/ML SOSY injection Inject 60 mg into the skin every 6 (six) months.     Marland Kitchen ibuprofen (ADVIL) 800 MG tablet Take 1 tablet (800 mg total) by mouth every 8 (eight) hours as needed for mild pain or moderate pain. 30 tablet 0  . Ipratropium-Albuterol (COMBIVENT RESPIMAT) 20-100 MCG/ACT AERS respimat Inhale 1 puff into the lungs every 6 (six) hours as needed for wheezing.    Marland Kitchen omeprazole (PRILOSEC) 20 MG capsule Take 1 capsule (20 mg total) by mouth daily. 90 capsule 1  . pravastatin (PRAVACHOL) 10 MG tablet Take one tablet  5 days per week. (Patient taking differently: Take 10 mg by mouth every Monday, Tuesday, Wednesday, Thursday, and Friday. ) 60 tablet 1  . Probiotic Product (PROBIOTIC DAILY PO) Take 1 capsule by mouth daily.     No current facility-administered medications on file prior to visit.    Social History   Tobacco Use  . Smoking status: Former Smoker    Packs/day: 1.00    Years: 30.00    Pack years: 30.00    Types: Cigarettes    Quit date: 04/26/2004    Years since quitting: 16.0  . Smokeless tobacco: Never Used  Vaping Use  . Vaping Use: Never used  Substance Use Topics  . Alcohol use: Yes    Alcohol/week: 0.0 standard drinks    Comment: occassional wine every other day  .  Drug use: No    Review of Systems  Constitutional: Negative for chills and fever.  HENT: Negative for congestion, sore throat and trouble swallowing.   Respiratory: Negative for cough.   Cardiovascular: Negative for chest pain and palpitations.  Gastrointestinal: Negative for nausea and vomiting.  Genitourinary: Positive for dysuria.  Musculoskeletal: Negative for neck pain.      Objective:    BP 130/72   Pulse 85   Temp 97.9 F (36.6 C)   Wt 191 lb 12.8 oz (87 kg)   LMP 07/20/1995   SpO2 98%   BMI 32.92 kg/m    Physical Exam Vitals reviewed.  Constitutional:      Appearance: She is well-developed.  HENT:     Head:      Comments: Subtle enlargement left side of neck proximal to clavicle.  No tenderness. Non fluctuant. Poorly circumscribed. No increased warmth or erythema.  Eyes:     Conjunctiva/sclera: Conjunctivae normal.  Neck:     Thyroid: No thyroid mass or thyromegaly.  Cardiovascular:     Rate and Rhythm: Normal rate and regular rhythm.     Pulses: Normal pulses.     Heart sounds: Normal heart sounds.  Pulmonary:     Effort: Pulmonary effort is normal.     Breath sounds: Normal breath sounds. No wheezing, rhonchi or rales.  Lymphadenopathy:     Head:     Right side of head: No submental, submandibular, tonsillar, preauricular, posterior auricular or occipital adenopathy.     Left side of head: No submental, submandibular, tonsillar, preauricular, posterior auricular or occipital adenopathy.     Cervical: No cervical adenopathy.  Skin:    General: Skin is warm and dry.  Neurological:     Mental Status: She is alert.  Psychiatric:        Speech: Speech normal.        Behavior: Behavior normal.        Thought Content: Thought content normal.        Assessment & Plan:    Problem List Items Addressed This Visit      Genitourinary   UTI (urinary tract infection)    Urine dip positive for nitrites, leukocytes. Recurrent UTI- she will re schedule  urology appointment. Pending urine culture and will await C & S prior to starting another antibiotic.        Other   Asymmetry of clavicles - Primary    Considering body habitus versus lipoma versus lymphadenopathy. Pending CT neck.       Relevant Orders   CT SOFT TISSUE NECK W CONTRAST    Other Visit Diagnoses    Dysuria  Relevant Orders   POCT Urinalysis Dipstick (Completed)   Urine Culture   Urine Microscopic Only       I have discontinued Nancy T. Vandivier's traMADol, collagenase, and nitrofurantoin (macrocrystal-monohydrate). I am also having her maintain her Vitamin D, denosumab, Probiotic Product (PROBIOTIC DAILY PO), ibuprofen, omeprazole, pravastatin, Calcium 500 +D, Combivent Respimat, and acetaminophen.   No orders of the defined types were placed in this encounter.   Return precautions given.   Risks, benefits, and alternatives of the medications and treatment plan prescribed today were discussed, and patient expressed understanding.   Education regarding symptom management and diagnosis given to patient on AVS.  Continue to follow with Einar Pheasant, MD for routine health maintenance.   Minna Antis and I agreed with plan.   Mable Paris, FNP

## 2020-05-07 NOTE — Patient Instructions (Signed)
I have ordered CT neck Let us know if you dont hear back within a week in regards to an appointment being scheduled.  We will call you with results of urine study as well.   Nice to meet you!

## 2020-05-07 NOTE — Assessment & Plan Note (Signed)
Considering body habitus versus lipoma versus lymphadenopathy. Pending CT neck.

## 2020-05-07 NOTE — Assessment & Plan Note (Signed)
Urine dip positive for nitrites, leukocytes. Recurrent UTI- she will re schedule urology appointment. Pending urine culture and will await C & S prior to starting another antibiotic.

## 2020-05-07 NOTE — Patient Instructions (Addendum)
Your procedure is scheduled on:05-08-20 TUESDAY Report to Day Surgery on the 2nd floor of the [facility] @ 11:30 AM   REMEMBER: Instructions that are not followed completely may result in serious medical risk, up to and including death; or upon the discretion of your surgeon and anesthesiologist your surgery may need to be rescheduled.  Do not eat food after midnight the night before surgery.  No gum chewing, lozengers or hard candies.  You may however, drink CLEAR liquids up to 2 hours before you are scheduled to arrive for your surgery. Do not drink anything within 2 hours of your scheduled arrival time.  Clear liquids include: - water  - apple juice without pulp - gatorade (not RED) - black coffee or tea (Do NOT add milk or creamers to the coffee or tea) Do NOT drink anything that is not on this list.  TAKE THESE MEDICATIONS THE MORNING OF SURGERY WITH A SIP OF WATER: -PRAVASTATIN (PRAVACHOL) -PRILOSEC (OMEPRAZOLE)-take one the night before and one on the morning of surgery - helps to prevent nausea after surgery.)  Use inhalers on the day of surgery and bring to the [facility]  One week prior to surgery: Stop Anti-inflammatories (NSAIDS) such as Advil, Aleve, Ibuprofen, Motrin, Naproxen, Naprosyn and Aspirin based products such as Excedrin, Goodys Powder, BC Powder-OK TO TAKE TYLENOL IF NEEDED  Stop ANY OVER THE COUNTER supplements until after surgery. (You may continue taking Vitamin D, calcium, and probiotic)  No Alcohol for 24 hours before or after surgery.  No Smoking including e-cigarettes for 24 hours prior to surgery.  No chewable tobacco products for at least 6 hours prior to surgery.  No nicotine patches on the day of surgery.  Do not use any "recreational" drugs for at least a week prior to your surgery.  Please be advised that the combination of cocaine and anesthesia may have negative outcomes, up to and  including death. If you test positive for cocaine, your surgery will be cancelled.  On the morning of surgery brush your teeth with toothpaste and water, you may rinse your mouth with mouthwash if you wish. Do not swallow any toothpaste or mouthwash.  Do not wear jewelry, make-up, hairpins, clips or nail polish.  Do not wear lotions, powders, or perfumes.   Do not shave 48 hours prior to surgery.   Contact lenses, hearing aids and dentures may not be worn into surgery.  Do not bring valuables to the hospital. [facility] is not responsible for any missing/lost belongings or valuables.   Notify your doctor if there is any change in your medical condition (cold, fever, infection).  Wear comfortable clothing (specific to your surgery type) to the hospital.  Plan for stool softeners for home use; pain medications have a tendency to cause constipation. You can also help prevent constipation by eating foods high in fiber such as fruits and vegetables and drinking plenty of fluids as your diet allows.  After surgery, you can help prevent lung complications by doing breathing exercises.  Take deep breaths and cough every 1-2 hours. Your doctor may order a device called an Incentive Spirometer to help you take deep breaths. When coughing or sneezing, hold a pillow firmly against your incision with both hands. This is called "splinting." Doing this helps protect your incision. It also decreases belly discomfort.  If you are being admitted to the hospital overnight, leave your suitcase in the car. After surgery it may  be brought to your room.  If you are being discharged the day of surgery, you will not be allowed to drive home. You will need a responsible adult (18 years or older) to drive you home and stay with you that night.   If you are taking public transportation, you will need to have a responsible adult (18 years or older) with you. Please confirm with your physician that it is  acceptable to use public transportation.   Please call the [place] Dept. at [phone] if you have any questions about these instructions.  Visitation Policy:  Patients undergoing a surgery or procedure may have one family member or support person with them as long as that person is not COVID-19 positive or experiencing its symptoms.  That person may remain in the waiting area during the procedure.  Inpatient Visitation Update:   In an effort to ensure the safety of our team members and our patients, we are implementing a change to our visitation policy:  Effective Monday, Aug. 9, at 7 a.m., inpatients will be allowed one support person.  o The support person may change daily.  o The support person must pass our screening, gel in and out, and wear a mask at all times, including in the patient's room.  o Patients must also wear a mask when staff or their support person are in the room.  o Masking is required regardless of vaccination status.  Systemwide, no visitors 17 or younger.

## 2020-05-08 ENCOUNTER — Ambulatory Visit: Payer: Medicare HMO | Admitting: Certified Registered Nurse Anesthetist

## 2020-05-08 ENCOUNTER — Other Ambulatory Visit: Payer: Self-pay

## 2020-05-08 ENCOUNTER — Encounter
Admission: RE | Disposition: A | Discharge status: Home / Self Care | Payer: Self-pay | Source: Home / Self Care | Attending: Surgery

## 2020-05-08 ENCOUNTER — Encounter: Payer: Self-pay | Admitting: Surgery

## 2020-05-08 ENCOUNTER — Ambulatory Visit
Admission: RE | Admit: 2020-05-08 | Discharge: 2020-05-08 | Disposition: A | Discharge status: Home / Self Care | Payer: Medicare HMO | Attending: Surgery | Admitting: Surgery

## 2020-05-08 ENCOUNTER — Ambulatory Visit: Payer: Self-pay | Admitting: Urology

## 2020-05-08 DIAGNOSIS — Z823 Family history of stroke: Secondary | ICD-10-CM | POA: Insufficient documentation

## 2020-05-08 DIAGNOSIS — Z8249 Family history of ischemic heart disease and other diseases of the circulatory system: Secondary | ICD-10-CM | POA: Diagnosis not present

## 2020-05-08 DIAGNOSIS — M1711 Unilateral primary osteoarthritis, right knee: Secondary | ICD-10-CM | POA: Diagnosis not present

## 2020-05-08 DIAGNOSIS — Z79899 Other long term (current) drug therapy: Secondary | ICD-10-CM | POA: Insufficient documentation

## 2020-05-08 DIAGNOSIS — Z885 Allergy status to narcotic agent status: Secondary | ICD-10-CM | POA: Diagnosis not present

## 2020-05-08 DIAGNOSIS — M94261 Chondromalacia, right knee: Secondary | ICD-10-CM | POA: Diagnosis not present

## 2020-05-08 DIAGNOSIS — Z888 Allergy status to other drugs, medicaments and biological substances status: Secondary | ICD-10-CM | POA: Diagnosis not present

## 2020-05-08 DIAGNOSIS — E7800 Pure hypercholesterolemia, unspecified: Secondary | ICD-10-CM | POA: Insufficient documentation

## 2020-05-08 DIAGNOSIS — M1990 Unspecified osteoarthritis, unspecified site: Secondary | ICD-10-CM | POA: Diagnosis not present

## 2020-05-08 DIAGNOSIS — Z8349 Family history of other endocrine, nutritional and metabolic diseases: Secondary | ICD-10-CM | POA: Insufficient documentation

## 2020-05-08 DIAGNOSIS — J449 Chronic obstructive pulmonary disease, unspecified: Secondary | ICD-10-CM | POA: Diagnosis not present

## 2020-05-08 DIAGNOSIS — Z87891 Personal history of nicotine dependence: Secondary | ICD-10-CM | POA: Insufficient documentation

## 2020-05-08 DIAGNOSIS — X58XXXA Exposure to other specified factors, initial encounter: Secondary | ICD-10-CM | POA: Insufficient documentation

## 2020-05-08 DIAGNOSIS — Z800 Family history of malignant neoplasm of digestive organs: Secondary | ICD-10-CM | POA: Diagnosis not present

## 2020-05-08 DIAGNOSIS — S83281A Other tear of lateral meniscus, current injury, right knee, initial encounter: Secondary | ICD-10-CM | POA: Diagnosis present

## 2020-05-08 DIAGNOSIS — S83271A Complex tear of lateral meniscus, current injury, right knee, initial encounter: Secondary | ICD-10-CM | POA: Diagnosis not present

## 2020-05-08 DIAGNOSIS — K219 Gastro-esophageal reflux disease without esophagitis: Secondary | ICD-10-CM | POA: Diagnosis not present

## 2020-05-08 DIAGNOSIS — Z880 Allergy status to penicillin: Secondary | ICD-10-CM | POA: Diagnosis not present

## 2020-05-08 HISTORY — PX: KNEE ARTHROSCOPY WITH LATERAL MENISECTOMY: SHX6193

## 2020-05-08 LAB — URINE CULTURE
MICRO NUMBER:: 11113820
Result:: NO GROWTH
SPECIMEN QUALITY:: ADEQUATE

## 2020-05-08 SURGERY — ARTHROSCOPY, KNEE, WITH LATERAL MENISCECTOMY
Anesthesia: General | Site: Knee | Laterality: Right

## 2020-05-08 MED ORDER — OXYCODONE HCL 5 MG/5ML PO SOLN: 5.0000 mg | Freq: Once | ORAL | Status: AC | PRN

## 2020-05-08 MED ORDER — PROPOFOL 10 MG/ML IV BOLUS
INTRAVENOUS | Status: AC
  Filled 2020-05-08: qty 20

## 2020-05-08 MED ORDER — FENTANYL CITRATE (PF) 100 MCG/2ML IJ SOLN
INTRAMUSCULAR | Status: AC
  Filled 2020-05-08: qty 2

## 2020-05-08 MED ORDER — HYDROCODONE-ACETAMINOPHEN 5-325 MG PO TABS: 1.0000 | ORAL_TABLET | ORAL | Status: DC | PRN

## 2020-05-08 MED ORDER — METOCLOPRAMIDE HCL 10 MG PO TABS: 5.0000 mg | ORAL_TABLET | Freq: Three times a day (TID) | ORAL | Status: DC | PRN

## 2020-05-08 MED ORDER — ONDANSETRON HCL 4 MG PO TABS: 4.0000 mg | ORAL_TABLET | Freq: Four times a day (QID) | ORAL | Status: DC | PRN

## 2020-05-08 MED ORDER — KETOROLAC TROMETHAMINE 30 MG/ML IJ SOLN
INTRAMUSCULAR | Status: DC | PRN
  Administered 2020-05-08: 30 mg via INTRAVENOUS

## 2020-05-08 MED ORDER — FENTANYL CITRATE (PF) 100 MCG/2ML IJ SOLN
25.0000 ug | INTRAMUSCULAR | Status: DC | PRN
  Administered 2020-05-08 (×4): 50 ug via INTRAVENOUS

## 2020-05-08 MED ORDER — ONDANSETRON HCL 4 MG/2ML IJ SOLN: 4.0000 mg | Freq: Four times a day (QID) | INTRAMUSCULAR | Status: DC | PRN

## 2020-05-08 MED ORDER — LIDOCAINE HCL (CARDIAC) PF 100 MG/5ML IV SOSY
PREFILLED_SYRINGE | INTRAVENOUS | Status: DC | PRN
  Administered 2020-05-08: 100 mg via INTRAVENOUS

## 2020-05-08 MED ORDER — MEPERIDINE HCL 50 MG/ML IJ SOLN: 6.2500 mg | INTRAMUSCULAR | Status: DC | PRN

## 2020-05-08 MED ORDER — OXYCODONE HCL 5 MG PO TABS
ORAL_TABLET | ORAL | Status: AC
  Filled 2020-05-08: qty 1

## 2020-05-08 MED ORDER — PROMETHAZINE HCL 25 MG/ML IJ SOLN: 6.2500 mg | INTRAMUSCULAR | Status: DC | PRN

## 2020-05-08 MED ORDER — CLINDAMYCIN PHOSPHATE 900 MG/50ML IV SOLN
900.0000 mg | INTRAVENOUS | Status: AC
  Administered 2020-05-08: 900 mg via INTRAVENOUS

## 2020-05-08 MED ORDER — HYDROCODONE-ACETAMINOPHEN 5-325 MG PO TABS
1.0000 | ORAL_TABLET | Freq: Four times a day (QID) | ORAL | 0 refills | Status: DC | PRN
Start: 2020-05-08 — End: 2020-08-07

## 2020-05-08 MED ORDER — CHLORHEXIDINE GLUCONATE 0.12 % MT SOLN
OROMUCOSAL | Status: AC
  Administered 2020-05-08: 15 mL via OROMUCOSAL
  Filled 2020-05-08: qty 15

## 2020-05-08 MED ORDER — EPHEDRINE 5 MG/ML INJ
INTRAVENOUS | Status: AC
  Filled 2020-05-08: qty 10

## 2020-05-08 MED ORDER — LIDOCAINE HCL 1 % IJ SOLN
INTRAMUSCULAR | Status: DC | PRN
  Administered 2020-05-08: 30 mL

## 2020-05-08 MED ORDER — LIDOCAINE HCL (PF) 2 % IJ SOLN
INTRAMUSCULAR | Status: AC
  Filled 2020-05-08: qty 5

## 2020-05-08 MED ORDER — CHLORHEXIDINE GLUCONATE 0.12 % MT SOLN: 15.0000 mL | Freq: Once | OROMUCOSAL | Status: AC

## 2020-05-08 MED ORDER — METOCLOPRAMIDE HCL 5 MG/ML IJ SOLN: 5.0000 mg | Freq: Three times a day (TID) | INTRAMUSCULAR | Status: DC | PRN

## 2020-05-08 MED ORDER — LACTATED RINGERS IV SOLN: INTRAVENOUS | Status: DC

## 2020-05-08 MED ORDER — SEVOFLURANE IN SOLN
RESPIRATORY_TRACT | Status: AC
  Filled 2020-05-08: qty 250

## 2020-05-08 MED ORDER — CLINDAMYCIN PHOSPHATE 900 MG/50ML IV SOLN
INTRAVENOUS | Status: AC
  Filled 2020-05-08: qty 50

## 2020-05-08 MED ORDER — ORAL CARE MOUTH RINSE: 15.0000 mL | Freq: Once | OROMUCOSAL | Status: AC

## 2020-05-08 MED ORDER — FENTANYL CITRATE (PF) 100 MCG/2ML IJ SOLN
INTRAMUSCULAR | Status: DC | PRN
  Administered 2020-05-08: 25 ug via INTRAVENOUS
  Administered 2020-05-08 (×2): 12.5 ug via INTRAVENOUS
  Administered 2020-05-08 (×2): 25 ug via INTRAVENOUS

## 2020-05-08 MED ORDER — PROPOFOL 10 MG/ML IV BOLUS
INTRAVENOUS | Status: DC | PRN
  Administered 2020-05-08: 20 mg via INTRAVENOUS
  Administered 2020-05-08: 30 mg via INTRAVENOUS
  Administered 2020-05-08: 130 mg via INTRAVENOUS

## 2020-05-08 MED ORDER — OXYCODONE HCL 5 MG PO TABS
5.0000 mg | ORAL_TABLET | Freq: Once | ORAL | Status: AC | PRN
  Administered 2020-05-08: 5 mg via ORAL

## 2020-05-08 MED ORDER — GLYCOPYRROLATE 0.2 MG/ML IJ SOLN
INTRAMUSCULAR | Status: DC | PRN
  Administered 2020-05-08: .1 mg via INTRAVENOUS

## 2020-05-08 MED ORDER — BUPIVACAINE-EPINEPHRINE (PF) 0.5% -1:200000 IJ SOLN
INTRAMUSCULAR | Status: DC | PRN
  Administered 2020-05-08: 30 mL via PERINEURAL
  Administered 2020-05-08: 20 mL via PERINEURAL

## 2020-05-08 MED ORDER — PHENYLEPHRINE HCL (PRESSORS) 10 MG/ML IV SOLN
INTRAVENOUS | Status: DC | PRN
  Administered 2020-05-08: 100 ug via INTRAVENOUS
  Administered 2020-05-08 (×2): 50 ug via INTRAVENOUS
  Administered 2020-05-08 (×3): 100 ug via INTRAVENOUS

## 2020-05-08 MED ORDER — DEXAMETHASONE SODIUM PHOSPHATE 10 MG/ML IJ SOLN
INTRAMUSCULAR | Status: DC | PRN
  Administered 2020-05-08: 10 mg via INTRAVENOUS

## 2020-05-08 MED ORDER — EPHEDRINE SULFATE 50 MG/ML IJ SOLN
INTRAMUSCULAR | Status: DC | PRN
  Administered 2020-05-08 (×4): 5 mg via INTRAVENOUS

## 2020-05-08 MED ORDER — ONDANSETRON HCL 4 MG/2ML IJ SOLN
INTRAMUSCULAR | Status: DC | PRN
  Administered 2020-05-08: 4 mg via INTRAVENOUS

## 2020-05-08 SURGICAL SUPPLY — 41 items
"PENCIL ELECTRO HAND CTR " (MISCELLANEOUS) ×1 IMPLANT
APL PRP STRL LF DISP 70% ISPRP (MISCELLANEOUS) ×1
BLADE FULL RADIUS 3.5 (BLADE) ×2 IMPLANT
BLADE SHAVER 4.5X7 STR FR (MISCELLANEOUS) ×2 IMPLANT
BNDG ELASTIC 6X5.8 VLCR STR LF (GAUZE/BANDAGES/DRESSINGS) ×2 IMPLANT
CATH ROBINSON RED A/P 14FR (CATHETERS) ×1 IMPLANT
CHLORAPREP W/TINT 26 (MISCELLANEOUS) ×2 IMPLANT
COLLECTOR GRAFT TISSUE (SYSTAGENIX WOUND MANAGEMENT) ×2
COVER WAND RF STERILE (DRAPES) ×2 IMPLANT
CUFF TOURN SGL QUICK 30 (TOURNIQUET CUFF) ×2
CUFF TRNQT CYL 30X4X21-28X (TOURNIQUET CUFF) IMPLANT
DRAPE IMP U-DRAPE 54X76 (DRAPES) ×1 IMPLANT
ELECT REM PT RETURN 9FT ADLT (ELECTROSURGICAL) ×2
ELECTRODE REM PT RTRN 9FT ADLT (ELECTROSURGICAL) ×1 IMPLANT
GAUZE SPONGE 4X4 12PLY STRL (GAUZE/BANDAGES/DRESSINGS) ×2 IMPLANT
GLOVE BIO SURGEON STRL SZ8 (GLOVE) ×4 IMPLANT
GLOVE BIOGEL M 7.0 STRL (GLOVE) ×4 IMPLANT
GLOVE BIOGEL PI IND STRL 7.5 (GLOVE) ×1 IMPLANT
GLOVE BIOGEL PI INDICATOR 7.5 (GLOVE) ×1
GLOVE INDICATOR 8.0 STRL GRN (GLOVE) ×2 IMPLANT
GOWN STRL REUS W/ TWL LRG LVL3 (GOWN DISPOSABLE) ×1 IMPLANT
GOWN STRL REUS W/ TWL XL LVL3 (GOWN DISPOSABLE) ×2 IMPLANT
GOWN STRL REUS W/TWL LRG LVL3 (GOWN DISPOSABLE) ×2
GOWN STRL REUS W/TWL XL LVL3 (GOWN DISPOSABLE) ×4
IV LACTATED RINGER IRRG 3000ML (IV SOLUTION) ×2
IV LR IRRIG 3000ML ARTHROMATIC (IV SOLUTION) ×1 IMPLANT
KIT TURNOVER KIT A (KITS) ×2 IMPLANT
MANIFOLD NEPTUNE II (INSTRUMENTS) ×2 IMPLANT
NDL HYPO 21X1.5 SAFETY (NEEDLE) ×1 IMPLANT
NEEDLE HYPO 21X1.5 SAFETY (NEEDLE) ×2 IMPLANT
PACK ARTHROSCOPY KNEE (MISCELLANEOUS) ×2 IMPLANT
PAD ABD DERMACEA PRESS 5X9 (GAUZE/BANDAGES/DRESSINGS) ×1 IMPLANT
PENCIL ELECTRO HAND CTR (MISCELLANEOUS) ×2 IMPLANT
SUT PROLENE 4 0 PS 2 18 (SUTURE) ×2 IMPLANT
SUT TICRON COATED BLUE 2 0 30 (SUTURE) IMPLANT
SYR 50ML LL SCALE MARK (SYRINGE) ×2 IMPLANT
SYR TOOMEY IRRIG 70ML (MISCELLANEOUS) ×2
SYRINGE TOOMEY IRRIG 70ML (MISCELLANEOUS) IMPLANT
TISSUE GRAFT COLLECTOR (SYSTAGENIX WOUND MANAGEMENT) IMPLANT
TUBING ARTHRO INFLOW-ONLY STRL (TUBING) ×2 IMPLANT
WAND WEREWOLF FLOW 90D (MISCELLANEOUS) ×2 IMPLANT

## 2020-05-08 NOTE — H&P (Signed)
History of Present Illness: Alexis Reed is a 74 y.o.female who is being referred by Cameron Proud, PA-C, for right knee pain. The symptoms began several months ago and developed shortly after she fell in her screened in porch and scraped up her knee in June, 2021. She notes that she was having intermittent discomfort in her knee due to "arthritis" prior to this fall, but she feels that this fall significantly increased her symptoms. She reports 8/10 pain. The pain is located along the lateral aspect of the knee. The pain is described as aching, stabbing and throbbing. The symptoms are aggravated with normal daily activities, with sleeping, using stairs, at higher levels of activity, walking, standing and standing pivot. She also describes no mechanical symptoms. She has associated swelling and no deformity. She has tried over-the-counter medications, anti-inflammatories, steroid injections and viscosupplementation injections with limited benefit.  Current Outpatient Medications: . calcium carbonate-vitamin D3 (CALTRATE 600+D) 600 mg(1,500mg ) -400 unit tablet Take 1 tablet by mouth once daily.  . cholecalciferol (VITAMIN D3) 2,000 unit capsule Take 2,000 Units by mouth once daily. Reported on 09/07/2015 . collagenase (SANTYL) ointment Apply 1 Application topically once daily  . ipratropium-albuterol (COMBIVENT RESPIMAT) 20-100 mcg/actuation inhaler Inhale into the lungs.  Marland Kitchen omeprazole (PRILOSEC) 20 MG DR capsule Take 20 mg by mouth once daily.  . pravastatin (PRAVACHOL) 10 MG tablet Take 1 tablet by mouth 3 (three) times a week Monday, Wednesday and Friday  . denosumab (PROLIA) 60 mg/mL inj syringe Inject 1 mL (60 mg total) subcutaneously once for 1 dose 1 mL 1   Current Facility-Administered Medications:  . denosumab (PROLIA) inj syringe 60 mg 60 mg Subcutaneous Q6 Months Solum, Felipa Evener, MD 60 mg at 07/02/18 1454   Allergies:  . Atorvastatin Hives  . Boniva [Ibandronate] Other (See Comments)   Aching  . Codeine Sulfate Swelling  Facial swelling  . Fosamax [Alendronate] Other (See Comments)  Caused aching.  . Mobic [Meloxicam] Other (See Comments)  GI Upset  . Penicillin V Potassium Unknown   Past Medical History:  . COPD (chronic obstructive pulmonary disease) (CMS-HCC)  . Hypercholesterolemia  . Osteoarthritis  a. Lumbar spine with spondylolisthesis. b. Trochanteric bursitis.  . Osteoporosis   Past Surgical History:  . APPENDECTOMY  . biopsy of breast Right 07/14/2014  . COLONOSCOPY 09/22/2008, 09/19/2003  FHCC (Father), FH Colon Polyps (Mother): C[facility] 09/2013  . COLONOSCOPY 12/05/2014  Adenomatous Polyp, FHCC (Father), FH Colon Polyps (Mother): [facility] 11/2019  . EGD 09/22/2008  No repeat per RTE  . HYSTERECTOMY 07/15/1995  . lump removal from breast Right 07/15/1975  . TONSILLECTOMY   Family History:  . Colon cancer Father  . Stroke Father  . Myocardial Infarction (Heart attack) Father  . No Known Problems Brother  . Thyroid disease Mother   Social History:   Socioeconomic History:  Marland Kitchen Marital status: Married  Spouse name: Not on file  . Number of children: Not on file  . Years of education: Not on file  . Highest education level: Not on file  Occupational History  . Not on file  Tobacco Use  . Smoking status: Former Research scientist (life sciences)  . Smokeless tobacco: Never Used  Vaping Use  . Vaping Use: Never used  Substance and Sexual Activity  . Alcohol use: Yes  Comment: Occasional  . Drug use: No  . Sexual activity: Not on file  Other Topics Concern  . Not on file  Social History Narrative  . Not on file   Social Determinants of  Health:   Emergency planning/management officer Strain: Not on file  Food Insecurity: Not on file  Transportation Needs: Not on file   Review of Systems:  A comprehensive 14 point ROS was performed, reviewed, and the pertinent orthopaedic findings are documented in the HPI.  Physical Exam: Vitals:  04/30/20 0913  BP: 118/76  Weight: 86.6 kg (191  lb)  Height: 162.6 cm (5\' 4" )  PainSc: 8  PainLoc: Knee   General/Constitutional: Pleasant overweight elderly female in no acute distress. Neuro/Psych: Normal mood and affect, oriented to person, place and time. Eyes: Non-icteric. Pupils are equal, round, and reactive to light, and exhibit synchronous movement. Lymphatic: No palpable adenopathy. Respiratory: Lungs clear to auscultation, Normal chest excursion, No wheezes and Non-labored breathing Cardiovascular: Regular rate and rhythm. No murmurs. and No edema, swelling or tenderness, except as noted in detailed exam. Vascular: No edema, swelling or tenderness, except as noted in detailed exam. Integumentary: No impressive skin lesions present, except as noted in detailed exam. Musculoskeletal: Unremarkable, except as noted in detailed exam.  Right knee exam: GAIT: moderate limp and uses no assistive devices. ALIGNMENT: mild valgus SKIN: Well-healed abrasions/lacerations over the anterior and lateral aspects of the knee, otherwise unremarkable SWELLING: mild EFFUSION: 1+ WARMTH: no warmth TENDERNESS: moderate over the lateral joint line and medial joint line ROM: 0 to 120 degrees with mild pain in maximal flexion McMURRAY'S: positive PATELLOFEMORAL: normal tracking with no peri-patellar tenderness and negative apprehension sign CREPITUS: Mild patellofemoral crepitance LACHMAN'S: negative PIVOT SHIFT: negative ANTERIOR DRAWER: negative POSTERIOR DRAWER: negative VARUS/VALGUS: stable  She is neurovascularly intact to the right lower extremity and foot.  Knee Imaging: Recent AP weightbearing of both knees, as well as lateral and merchant views of the right knee are available for review and have been reviewed by myself. These films demonstrate moderate-severe degenerative changes, primarily involving the lateral compartment with 80% lateral joint space narrowing. Overall alignment is mild valgus. No fractures, lytic lesions, or  abnormal calcifications are noted.  Knee Imaging, external: Right knee: A recent MRI scan of the right knee is available for review. By report, the scan demonstrates evidence of moderate to severe degenerative changes, primarily about the lateral compartment associated with mild subcortical marrow edema in the lateral femoral condyle and lateral tibial plateau. It also demonstrates evidence of degenerative tearing of the anterior horn of the lateral meniscus extending into the free edge of the mid body of the lateral meniscus. Less degenerative changes of the lateral patellofemoral compartments are noted. There is no evidence for a medial meniscus tear or ligamentous pathology. Both the films and report were reviewed by myself and discussed with the patient.  Assessment:  . Primary osteoarthritis of right knee.  . Complex tear of lateral meniscus of right knee as current injury.  Plan: The treatment options were discussed with the patient. In addition, patient educational materials were provided regarding the diagnosis and treatment options. The patient is quite frustrated by her persistent symptoms and functional limitations despite medications, activity modification, steroid injections, and a series of viscosupplementation injections, and is ready to consider more aggressive treatment options. However, the patient is not yet want to contemplate a knee replacement.. Therefore, I have recommended a surgical procedure, specifically a right knee arthroscopy with debridement and partial lateral meniscectomy. The procedure was discussed with the patient, as were the potential risks (including bleeding, infection, nerve and/or blood vessel injury, persistent or recurrent pain, failure of the repair, progression of arthritis, need for further surgery, blood clots, strokes,  heart attacks and/or arhythmias, pneumonia, etc.) and benefits. The patient states her understanding and wishes to proceed. She understands  that this procedure may not alleviate all of her symptoms and that she may still require a knee replacement down the road. All of the patient's questions and concerns were answered. She can call any time with further concerns. She will follow up post-surgery, routine.   H&P reviewed and patient re-examined. No changes.

## 2020-05-08 NOTE — Discharge Instructions (Addendum)
Orthopedic discharge instructions: Keep dressing dry and intact.  May shower after dressing changed on post-op day #4 (Saturday).  Cover sutures with Band-Aids after drying off. Apply ice frequently to knee. Take ibuprofen 800 mg TID with meals for 7-10 days, then as necessary.    TID = THREE TIMES PER DAY (EVERY 8 HOURS) Take pain medication as prescribed or ES Tylenol when needed.  May weight-bear as tolerated - use crutches or walker as needed. Follow-up in 10-14 days or as scheduled.   AMBULATORY SURGERY  DISCHARGE INSTRUCTIONS   1) The drugs that you were given will stay in your system until tomorrow so for the next 24 hours you should not:  A) Drive an automobile B) Make any legal decisions C) Drink any alcoholic beverage   2) You may resume regular meals tomorrow.  Today it is better to start with liquids and gradually work up to solid foods.  You may eat anything you prefer, but it is better to start with liquids, then soup and crackers, and gradually work up to solid foods.   3) Please notify your doctor immediately if you have any unusual bleeding, trouble breathing, redness and pain at the surgery site, drainage, fever, or pain not relieved by medication.    4) Additional Instructions:        Please contact your physician with any problems or Same Day Surgery at [phone], Monday through Friday 6 am to 4 pm, or [place] at [facility] number at [phone].

## 2020-05-08 NOTE — Anesthesia Postprocedure Evaluation (Signed)
Anesthesia Post Note  Patient: Alexis Reed  Procedure(s) Performed: RIGHT KNEE ARTHROSCOPY WITH DEBRIDEMENT AND PARTIAL LATERAL MENISCECTOMY (Right Knee)  Patient location during evaluation: PACU Anesthesia Type: General Level of consciousness: awake and alert and oriented Pain management: pain level controlled Vital Signs Assessment: post-procedure vital signs reviewed and stable Respiratory status: spontaneous breathing, nonlabored ventilation and respiratory function stable Cardiovascular status: blood pressure returned to baseline and stable Postop Assessment: no signs of nausea or vomiting Anesthetic complications: no   No complications documented.   Last Vitals:  Vitals:   05/08/20 1430 05/08/20 1449  BP:  (!) 151/68  Pulse:  91  Resp:  16  Temp:  (!) 36.3 C  SpO2: 92% 96%    Last Pain:  Vitals:   05/08/20 1449  TempSrc: Temporal  PainSc: 2                  Neal Oshea

## 2020-05-08 NOTE — Anesthesia Preprocedure Evaluation (Addendum)
Anesthesia Evaluation  Patient identified by MRN, date of birth, ID band Patient awake    Reviewed: Allergy & Precautions, NPO status , Patient's Chart, lab work & pertinent test results  History of Anesthesia Complications Negative for: history of anesthetic complications  Airway Mallampati: II  TM Distance: >3 FB Neck ROM: Full    Dental no notable dental hx.    Pulmonary neg sleep apnea, COPD,  COPD inhaler, former smoker,    breath sounds clear to auscultation- rhonchi (-) wheezing      Cardiovascular (-) hypertension(-) CAD, (-) Past MI, (-) Cardiac Stents and (-) CABG  Rhythm:Regular Rate:Normal - Systolic murmurs and - Diastolic murmurs    Neuro/Psych neg Seizures negative neurological ROS  negative psych ROS   GI/Hepatic Neg liver ROS, GERD  ,  Endo/Other  negative endocrine ROSneg diabetes  Renal/GU negative Renal ROS     Musculoskeletal  (+) Arthritis ,   Abdominal   Peds  Hematology negative hematology ROS (+)   Anesthesia Other Findings Past Medical History: No date: Arthritis No date: Benign breast lumps     Comment:  multiple lumps biopsy and remove x's 5 last being 1979               by Dr. Sharlet Salina No date: COPD (chronic obstructive pulmonary disease) ([place])     Comment:  MILD-RARELY EVER HAS TO USE COMBIVENT INHALER No date: Fibroids     Comment:  s/p hysterectomy No date: GERD (gastroesophageal reflux disease) No date: Hypercholesterolemia No date: Osteoporosis 08/07/2015: Personal history of tobacco use, presenting hazards to  health   Reproductive/Obstetrics                            Anesthesia Physical Anesthesia Plan  ASA: II  Anesthesia Plan: General   Post-op Pain Management:    Induction: Intravenous  PONV Risk Score and Plan: 2 and Dexamethasone and Ondansetron  Airway Management Planned: LMA  Additional Equipment:   Intra-op Plan:    Post-operative Plan:   Informed Consent: I have reviewed the patients History and Physical, chart, labs and discussed the procedure including the risks, benefits and alternatives for the proposed anesthesia with the patient or authorized representative who has indicated his/her understanding and acceptance.     Dental advisory given  Plan Discussed with: CRNA and Anesthesiologist  Anesthesia Plan Comments:         Anesthesia Quick Evaluation

## 2020-05-08 NOTE — Op Note (Signed)
05/08/2020  1:39 PM  Patient:   Alexis Reed  Pre-Op Diagnosis:   Complex lateral meniscus tear with degenerative joint disease, right knee.  Postoperative diagnosis:   Same  Procedure:   Arthroscopic partial lateral meniscectomy with abrasion chondroplasty of lateral femoral condyle and extensive synovectomy, right knee.  Surgeon:   Pascal Lux, MD  Assistant:   Sherlene Shams, PA-S  Anesthesia:   General LMA  Findings:   As above. There were extensive grade 4 chondromalacial changes involving the entire lateral tibial plateau and the lateral portion of the lateral femoral condyle. There was grade 2 chondromalacial changes involving the femoral trochlea. The medial tibial plateau and medial femoral condyle are surfaces were in satisfactory condition. The medial meniscus was in excellent condition, as were the anterior and posterior cruciate ligaments.  Complications:   None.  EBL:   2 cc.  Total fluids:   600 cc of crystalloid.  Tourniquet time:   None  Drains:   None  Closure:   4-0 Prolene interrupted sutures.  Brief clinical note:   The patient is a 74 year old female with a several month history of lateral sided right knee pain following a trip and fall injury onto her flexed knee. Her symptoms have persisted despite medications, activity modification, injections, etc. Her history examination are consistent with a complex lateral meniscus tear with underlying degenerative joint disease, all of which were confirmed by a preoperative MRI scan. The patient presents at this time for arthroscopy, debridement, and partial lateral meniscectomy.  Procedure:   The patient was brought into the operating room and lain in the supine position. After adequate general laryngeal mask anesthesia was obtained, a timeout was performed to verify the appropriate side. The patient's right knee was injected sterilely using a solution of 30 cc of 1% lidocaine and 30 cc of 0.5% Sensorcaine with  epinephrine. The right lower extremity was prepped with ChloraPrep solution before being draped sterilely. Preoperative antibiotics were administered. The expected portal sites were injected with 0.5% Sensorcaine with epinephrine before the camera was placed in the anterolateral portal and instrumentation performed through the anteromedial portal.   The knee was sequentially examined beginning in the suprapatellar pouch, then progressing to the patellofemoral space, the medial gutter and compartment, the notch, and finally the lateral compartment and gutter. The findings were as described above. Abundant reactive synovial tissues anteriorly were debrided using the full-radius resector in order to improve visualization. Some of this fat was collected using the Arthrex GraftNet device and saved for later. The areas of tearing along the anterior, mid body, and posterior portions of the lateral meniscus were debrided back to stable margins using a combination of the small baskets and full-radius resector. Areas of loose articular cartilage along the lateral femoral condyle also were debrided back to stable margins using the full-radius resector.  The remaining portions of the lateral meniscus were probed and found to be stable. The instruments were removed from the joint after suctioning the excess fluid. The scope sheath was left in place and a #12 French red rubber catheter inserted through this. The fat tissue that had been collected via the Arthrex GraftNet device was then injected back into the joint using a [place]y syringe through the #12 French red rubber catheter. These devices were then removed from the joint as well.  The portal sites were closed using 4-0 Prolene interrupted sutures before a sterile bulky dressing was applied to the knee. The patient was then awakened, extubated, and returned  to the recovery room in satisfactory condition after tolerating the procedure well.

## 2020-05-08 NOTE — Transfer of Care (Signed)
Immediate Anesthesia Transfer of Care Note  Patient: Alexis Reed  Procedure(s) Performed: RIGHT KNEE ARTHROSCOPY WITH DEBRIDEMENT AND PARTIAL LATERAL MENISCECTOMY (Right Knee)  Patient Location: PACU  Anesthesia Type:General  Level of Consciousness: drowsy and patient cooperative  Airway & Oxygen Therapy: Patient Spontanous Breathing  Post-op Assessment: Report given to RN and Post -op Vital signs reviewed and stable  Post vital signs: Reviewed and stable  Last Vitals:  Vitals Value Taken Time  BP 119/58 05/08/20 1346  Temp 36.1 C 05/08/20 1340  Pulse 109 05/08/20 1347  Resp 17 05/08/20 1347  SpO2 94 % 05/08/20 1347  Vitals shown include unvalidated device data.  Last Pain:  Vitals:   05/08/20 1340  TempSrc:   PainSc: 0-No pain         Complications: No complications documented.

## 2020-05-08 NOTE — Anesthesia Procedure Notes (Addendum)
Procedure Name: LMA Insertion Date/Time: 05/08/2020 12:42 PM Performed by: Lily Peer, Johnathan Tortorelli, CRNA Pre-anesthesia Checklist: Patient identified, Emergency Drugs available, Suction available and Patient being monitored Patient Re-evaluated:Patient Re-evaluated prior to induction Oxygen Delivery Method: Circle system utilized Preoxygenation: Pre-oxygenation with 100% oxygen Induction Type: IV induction Ventilation: Mask ventilation without difficulty LMA: LMA inserted LMA Size: 3.5 Number of attempts: 2 (LMA size 4 attempted X1, unable to seat, switched to LMA 3.5) Placement Confirmation: positive ETCO2 and breath sounds checked- equal and bilateral Tube secured with: Tape Dental Injury: Teeth and Oropharynx as per pre-operative assessment

## 2020-05-09 ENCOUNTER — Encounter: Payer: Self-pay | Admitting: Surgery

## 2020-05-28 ENCOUNTER — Other Ambulatory Visit: Payer: Self-pay

## 2020-05-28 ENCOUNTER — Ambulatory Visit
Admission: RE | Admit: 2020-05-28 | Discharge: 2020-05-28 | Disposition: A | Discharge status: Home / Self Care | Payer: Medicare HMO | Source: Ambulatory Visit | Attending: Family | Admitting: Family

## 2020-05-28 DIAGNOSIS — Q740 Other congenital malformations of upper limb(s), including shoulder girdle: Secondary | ICD-10-CM | POA: Insufficient documentation

## 2020-05-28 LAB — POCT I-STAT CREATININE: Creatinine, Ser: 0.5 mg/dL (ref 0.44–1.00)

## 2020-05-28 MED ORDER — IOHEXOL 300 MG/ML  SOLN
75.0000 mL | Freq: Once | INTRAMUSCULAR | Status: AC | PRN
  Administered 2020-05-28: 75 mL via INTRAVENOUS

## 2020-05-29 ENCOUNTER — Encounter: Payer: Self-pay | Admitting: Internal Medicine

## 2020-05-29 ENCOUNTER — Telehealth: Payer: Self-pay | Admitting: Family

## 2020-05-29 ENCOUNTER — Encounter: Payer: Self-pay | Admitting: Family

## 2020-05-29 DIAGNOSIS — E041 Nontoxic single thyroid nodule: Secondary | ICD-10-CM | POA: Insufficient documentation

## 2020-05-29 NOTE — Telephone Encounter (Signed)
Call pt Please advise that CT neck showed thyroid nodule; I would recommend dedicated thyroid Ultrasound which I have ordered Let us know if you dont hear back within a week in regards to an appointment being scheduled.   Please sch follow up after thyroid US has been scheduled , so sch f/u in one month and we will discuss [place] and CT in more detail.

## 2020-05-29 NOTE — Telephone Encounter (Signed)
I spoke with patient & she wanted to go ahead with getting scheduled with Dr. Nicki Reaper to discuss CT as well as [place]. She has not been scheduled for [place] yet, but wanted to get on the books here. The only appointments available we 8am appointments in December. I scheduled patient 8am 12/21, but she does not really want to come that early in the morning & hopes to be seen sooner than this. I told patient that I would send message to see if her appointment could be worked in elsewhere. I also advised idk how long it will take for [place] to be done, but that needed to have happened before her visit to discuss.

## 2020-05-30 NOTE — Telephone Encounter (Signed)
I spoke with radiologist Dr Lyla Glassing this morning who stated left 57mm supraclavicular lymph node appeared benign. She mentioned it in particular as patient had marked the area and she thought they may be what patient is feeling. She is aware of her smoking history. She felt appropriate to monitor clinically if were to enlarge or become more painful.  US thyroid scheduled for 06/11/20.  I see she has follow up with you to discuss image, carotid evaluation.   Let me know if you need me to do anything Dr Nicki Reaper

## 2020-05-30 NOTE — Telephone Encounter (Signed)
I can see her at 4:00 on Monday 06/04/20 or 12:00 on Tuesday 06/05/20.

## 2020-05-30 NOTE — Telephone Encounter (Signed)
Pt has been scheduled for Monday 11/22.

## 2020-05-30 NOTE — Telephone Encounter (Signed)
Will discuss with Dr Nicki Reaper. Confirmed nothing needed at this time.

## 2020-05-30 NOTE — Telephone Encounter (Signed)
Patient has been scheduled with Dr Nicki Reaper on Monday

## 2020-06-04 ENCOUNTER — Ambulatory Visit: Payer: Medicare HMO | Admitting: Internal Medicine

## 2020-06-04 ENCOUNTER — Encounter: Payer: Self-pay | Admitting: Internal Medicine

## 2020-06-04 ENCOUNTER — Other Ambulatory Visit: Payer: Self-pay

## 2020-06-04 DIAGNOSIS — J438 Other emphysema: Secondary | ICD-10-CM | POA: Diagnosis not present

## 2020-06-04 DIAGNOSIS — E7800 Pure hypercholesterolemia, unspecified: Secondary | ICD-10-CM | POA: Diagnosis not present

## 2020-06-04 DIAGNOSIS — Q740 Other congenital malformations of upper limb(s), including shoulder girdle: Secondary | ICD-10-CM | POA: Diagnosis not present

## 2020-06-04 DIAGNOSIS — E041 Nontoxic single thyroid nodule: Secondary | ICD-10-CM

## 2020-06-04 DIAGNOSIS — K219 Gastro-esophageal reflux disease without esophagitis: Secondary | ICD-10-CM

## 2020-06-04 DIAGNOSIS — N63 Unspecified lump in unspecified breast: Secondary | ICD-10-CM

## 2020-06-04 MED ORDER — COMBIVENT RESPIMAT 20-100 MCG/ACT IN AERS: 1.0000 | INHALATION_SPRAY | Freq: Four times a day (QID) | RESPIRATORY_TRACT | 1 refills | Status: DC | PRN

## 2020-06-04 NOTE — Progress Notes (Signed)
Patient ID: Alexis Reed, female   DOB: 03/02/1946, 74 y.o.   MRN: [id number]   Subjective:    Patient ID: Alexis Reed, female    DOB: 10-09-1945, 74 y.o.   MRN: [id number]  HPI This visit occurred during the SARS-CoV-2 public health emergency.  Safety protocols were in place, including screening questions prior to the visit, additional usage of staff PPE, and extensive cleaning of exam room while observing appropriate contact time as indicated for disinfecting solutions.  Patient here for work in appt to discuss recent CT scan - results.  Was seen 05/07/20 by Mable Paris for soft tissue fullness - over left clavicle.  CT scan ordered.  CT - nodule with coarse calcification right thyroid lobe, measuring 20x52mm.  Also lymph node noted - 60mm left supraclavicular lymph nodes, felt to possibly represent palpable area on exam.  No lymphadenopathy identified. Discussed results.  Discussed recommendation for thyroid ultrasound.  She is agreeable.   S/p surgery - knee surgery - 05/08/20.  Seeing ortho.  Had been taking ibuprofen.  Has backed off now.   No chest pain or increased sob reported.  Breathing stable.  No increased cough or congestion.  No acid reflux.  No abdominal pain.  Bowels moving.  Does report left breast pain.  Persistent.    Past Medical History:  Diagnosis Date  . Arthritis   . Benign breast lumps    multiple lumps biopsy and remove x's 5 last being 1979 by Dr. Sharlet Salina  . COPD (chronic obstructive pulmonary disease) (HCC)    MILD-RARELY EVER HAS TO USE COMBIVENT INHALER  . Fibroids    s/p hysterectomy  . GERD (gastroesophageal reflux disease)   . Hypercholesterolemia   . Osteoporosis   . Personal history of tobacco use, presenting hazards to health 08/07/2015   Past Surgical History:  Procedure Laterality Date  . ABDOMINAL HYSTERECTOMY  1997   with bilateral oophorectomy  . BREAST BIOPSY Right 1975   multiple biopsies done  . BREAST BIOPSY Left    multiple done    . BREAST BIOPSY Right 2016   stereo- neg. FC changes  . BREAST SURGERY Left    lump removed. Unsure of date  . BREAST SURGERY Right    lump removed. Unsure of date  . COLONOSCOPY  2010   Dr. Vira Agar  . KNEE ARTHROSCOPY WITH LATERAL MENISECTOMY Right 05/08/2020   Procedure: RIGHT KNEE ARTHROSCOPY WITH DEBRIDEMENT AND PARTIAL LATERAL MENISCECTOMY;  Surgeon: Corky Mull, MD;  Location: [facility] ORS;  Service: Orthopedics;  Laterality: Right;  . LAPAROSCOPIC APPENDECTOMY N/A 12/21/2018   Procedure: APPENDECTOMY LAPAROSCOPIC CONVERTED TO OPEN;  Surgeon: Benjamine Sprague, DO;  Location: [facility] OR[facility];  Service: General;  Laterality: N/A;  . TONSILLECTOMY     Family History  Problem Relation Age of Onset  . Hyperlipidemia Mother   . Thyroid disease Mother   . Hypertension Mother   . Colon cancer Father   . Stroke Father   . Heart disease Father        myocardial infarction  . Breast cancer Neg Hx    Social History   Socioeconomic History  . Marital status: Married    Spouse name: Not on file  . Number of children: 2  . Years of education: Not on file  . Highest education level: Not on file  Occupational History  . Not on file  Tobacco Use  . Smoking status: Former Smoker    Packs/day: 1.00    Years: 30.00  Pack years: 30.00    Types: Cigarettes    Quit date: 04/26/2004    Years since quitting: 16.1  . Smokeless tobacco: Never Used  Vaping Use  . Vaping Use: Never used  Substance and Sexual Activity  . Alcohol use: Yes    Alcohol/week: 0.0 standard drinks    Comment: occassional wine every other day  . Drug use: No  . Sexual activity: Not on file  Other Topics Concern  . Not on file  Social History Narrative   She is married, has two children. Works at [employer] Strain:   . Difficulty of Paying Living Expenses: Not on file  Food Insecurity:   . Worried About Charity fundraiser in the Last  Year: Not on file  . Ran Out of Food in the Last Year: Not on file  Transportation Needs:   . Lack of Transportation (Medical): Not on file  . Lack of Transportation (Non-Medical): Not on file  Physical Activity:   . Days of Exercise per Week: Not on file  . Minutes of Exercise per Session: Not on file  Stress:   . Feeling of Stress : Not on file  Social Connections:   . Frequency of Communication with Friends and Family: Not on file  . Frequency of Social Gatherings with Friends and Family: Not on file  . Attends Religious Services: Not on file  . Active Member of Clubs or Organizations: Not on file  . Attends Archivist Meetings: Not on file  . Marital Status: Not on file    Outpatient Encounter Medications as of 06/04/2020  Medication Sig  . acetaminophen (TYLENOL) 500 MG tablet Take 1,000 mg by mouth every 6 (six) hours as needed for moderate pain.  . Calcium Carb-Cholecalciferol (CALCIUM 500 +D) 500-400 MG-UNIT TABS Take 1 tablet by mouth daily.  . Cholecalciferol (VITAMIN D) 2000 UNITS tablet Take 2,000 Units by mouth daily.  Marland Kitchen denosumab (PROLIA) 60 MG/ML SOSY injection Inject 60 mg into the skin every 6 (six) months.   Marland Kitchen HYDROcodone-acetaminophen (NORCO) 5-325 MG tablet Take 1-2 tablets by mouth every 6 (six) hours as needed for moderate pain or severe pain. MAXIMUM TOTAL ACETAMINOPHEN DOSE IS 4000 MG PER DAY  . ibuprofen (ADVIL) 800 MG tablet Take 1 tablet (800 mg total) by mouth every 8 (eight) hours as needed for mild pain or moderate pain.  . Ipratropium-Albuterol (COMBIVENT RESPIMAT) 20-100 MCG/ACT AERS respimat Inhale 1 puff into the lungs every 6 (six) hours as needed for wheezing.  Marland Kitchen omeprazole (PRILOSEC) 20 MG capsule Take 1 capsule (20 mg total) by mouth daily. (Patient taking differently: Take 20 mg by mouth every morning. )  . pravastatin (PRAVACHOL) 10 MG tablet Take one tablet 5 days per week. (Patient taking differently: Take 10 mg by mouth every Monday,  Tuesday, Wednesday, Thursday, and Friday. AM)  . Probiotic Product (PROBIOTIC DAILY PO) Take 1 capsule by mouth daily.  . [DISCONTINUED] Ipratropium-Albuterol (COMBIVENT RESPIMAT) 20-100 MCG/ACT AERS respimat Inhale 1 puff into the lungs every 6 (six) hours as needed for wheezing.   No facility-administered encounter medications on file as of 06/04/2020.    Review of Systems  Constitutional: Negative for appetite change and unexpected weight change.  HENT: Negative for congestion and sinus pressure.   Respiratory: Negative for cough, chest tightness and shortness of breath.   Cardiovascular: Negative for chest pain, palpitations and leg swelling.  Gastrointestinal:  Negative for abdominal pain, diarrhea, nausea and vomiting.  Genitourinary: Negative for difficulty urinating and dysuria.  Musculoskeletal: Negative for myalgias.       S/p knee surgery as outlined.  Still recovering.   Skin: Negative for color change and rash.  Neurological: Negative for dizziness, light-headedness and headaches.  Psychiatric/Behavioral: Negative for agitation and dysphoric mood.       Objective:    Physical Exam Constitutional:      General: She is not in acute distress.    Appearance: Normal appearance.  HENT:     Head: Normocephalic and atraumatic.     Right Ear: External ear normal.     Left Ear: External ear normal.  Eyes:     General: No scleral icterus.       Right eye: No discharge.        Left eye: No discharge.     Conjunctiva/sclera: Conjunctivae normal.  Neck:     Thyroid: No thyromegaly.  Cardiovascular:     Rate and Rhythm: Normal rate and regular rhythm.  Pulmonary:     Effort: No respiratory distress.     Breath sounds: Normal breath sounds. No wheezing.     Comments: Breasts:  No nipple discharge or nipple retraction present.  Palpable fullness 2:00 left breast.  No other palpable nodules or axillary adenopathy.   Abdominal:     General: Bowel sounds are normal.      Palpations: Abdomen is soft.     Tenderness: There is no abdominal tenderness.  Musculoskeletal:        General: No swelling or tenderness.     Cervical back: Neck supple. No tenderness.  Lymphadenopathy:     Cervical: No cervical adenopathy.  Skin:    Findings: No erythema or rash.  Neurological:     Mental Status: She is alert.  Psychiatric:        Mood and Affect: Mood normal.        Behavior: Behavior normal.     BP 128/76   Pulse 90   Temp 97.7 F (36.5 C) (Oral)   Resp 16   Wt 192 lb 12.8 oz (87.5 kg)   LMP 07/20/1995   SpO2 98%   BMI 34.15 kg/m  Wt Readings from Last 3 Encounters:  06/04/20 192 lb 12.8 oz (87.5 kg)  05/08/20 190 lb (86.2 kg)  05/07/20 191 lb 12.8 oz (87 kg)     Lab Results  Component Value Date   WBC 4.2 04/11/2020   HGB 13.7 04/11/2020   HCT 41.7 04/11/2020   PLT 256.0 04/11/2020   GLUCOSE 100 (H) 04/11/2020   CHOL 184 04/11/2020   TRIG 138.0 04/11/2020   HDL 51.40 04/11/2020   LDLDIRECT 153.0 07/17/2015   LDLCALC 105 (H) 04/11/2020   ALT 11 04/11/2020   AST 12 04/11/2020   NA 141 04/11/2020   K 4.0 04/11/2020   CL 103 04/11/2020   CREATININE 0.50 05/28/2020   BUN 15 04/11/2020   CO2 32 04/11/2020   TSH 1.32 04/11/2020    CT SOFT TISSUE NECK W CONTRAST  Result Date: 05/28/2020 CLINICAL DATA:  Lymphadenopathy, neck. Left supraclavicular enlarged. EXAM: CT NECK WITH CONTRAST TECHNIQUE: Multidetector CT imaging of the neck was performed using the standard protocol following the bolus administration of intravenous contrast. CONTRAST:  38mL OMNIPAQUE IOHEXOL 300 MG/ML  SOLN COMPARISON:  CT of the neck August 03, 2006. FINDINGS: Pharynx and larynx: Normal. No mass or swelling. Salivary glands: No inflammation, mass, or stone. Thyroid:  Heterogeneous exophytic nodule with coarse calcification projecting posteriorly from the right thyroid lobe, measuring 20 x 11 mm, enlarged compared to prior CT. Lymph nodes: A 6 mm left supraclavicular  lymph nodes, may represent palpable structure. No lymphadenopathy identified. Vascular: Calcified plaques in the bilateral carotid bifurcations and carotid siphons. Major neck vessels are patent. Limited intracranial: Negative. Visualized orbits: Negative. Mastoids and visualized paranasal sinuses: Clear. Soft tissue density within the right external auditory canal, may represent cerumen. Skeleton: Degenerative changes of the temporomandibular joints, right greater than left. No aggressive bone lesion identified. Upper chest: Centrilobular emphysema and biapical scarring. Other: None. IMPRESSION: 1. A 6 mm left supraclavicular lymph node, may represent palpable structure. No lymphadenopathy identified. 2. A 20 mm right thyroid lobe nodule, enlarged compared to prior CT. Recommend thyroid ultrasound for further evaluation. 3. Centrilobular emphysema and biapical scarring. 4. Soft tissue density within the right external auditory canal, may represent cerumen. Correlate with physical exam. 5. Degenerative changes of the temporomandibular joints, right greater than left. Emphysema (ICD10-J43.9). Electronically Signed   By: Pedro Earls M.D.   On: 05/28/2020 16:26       Assessment & Plan:   Problem List Items Addressed This Visit    Thyroid nodule    Thyroid nodule on CT as outlined.  Schedule thyroid ultrasound.  Discussed probable need for referral to endocrinology.        Hypercholesterolemia    On crestor.  Low cholesterol diet and exercise.  Follow lipid panel and liver function tests.        GOLD GRADE A COPD with Centrilobular Emphysema    Breathing stable.  Continue combivent.        Relevant Medications   Ipratropium-Albuterol (COMBIVENT RESPIMAT) 20-100 MCG/ACT AERS respimat   GERD (gastroesophageal reflux disease)    Acid reflux controlled on prilosec.  Follow.       Breast nodule    Breast nodule - left breast 2:00.  Previous concern regarding hematoma.  Given  persistent palpable nodule, recheck left breast mammogram with possible ultrasound.  Further w/up pending results.        Relevant Orders   MM DIAG BREAST TOMO UNI LEFT   US BREAST LTD UNI LEFT INC AXILLA   Asymmetry of clavicles    Soft tissue fullness - above left clavicle.  Non tender.  CT questioning lymph node.  No other lymphadenopathy.  Follow.            Einar Pheasant, MD

## 2020-06-10 ENCOUNTER — Encounter: Payer: Self-pay | Admitting: Internal Medicine

## 2020-06-10 DIAGNOSIS — N63 Unspecified lump in unspecified breast: Secondary | ICD-10-CM | POA: Insufficient documentation

## 2020-06-10 NOTE — Assessment & Plan Note (Signed)
Soft tissue fullness - above left clavicle.  Non tender.  CT questioning lymph node.  No other lymphadenopathy.  Follow.

## 2020-06-10 NOTE — Assessment & Plan Note (Signed)
Breast nodule - left breast 2:00.  Previous concern regarding hematoma.  Given persistent palpable nodule, recheck left breast mammogram with possible ultrasound.  Further w/up pending results.

## 2020-06-10 NOTE — Assessment & Plan Note (Addendum)
Breathing stable.  Continue combivent.

## 2020-06-10 NOTE — Assessment & Plan Note (Signed)
Thyroid nodule on CT as outlined.  Schedule thyroid ultrasound.  Discussed probable need for referral to endocrinology.

## 2020-06-10 NOTE — Assessment & Plan Note (Signed)
Acid reflux controlled on prilosec.  Follow.

## 2020-06-10 NOTE — Assessment & Plan Note (Signed)
On crestor.  Low cholesterol diet and exercise.  Follow lipid panel and liver function tests.   

## 2020-06-11 ENCOUNTER — Other Ambulatory Visit: Payer: Self-pay

## 2020-06-11 ENCOUNTER — Ambulatory Visit
Admission: RE | Admit: 2020-06-11 | Discharge: 2020-06-11 | Disposition: A | Discharge status: Home / Self Care | Payer: Medicare HMO | Source: Ambulatory Visit | Attending: Family | Admitting: Family

## 2020-06-11 DIAGNOSIS — E041 Nontoxic single thyroid nodule: Secondary | ICD-10-CM | POA: Diagnosis not present

## 2020-06-11 IMAGING — US US THYROID
1 series · 13 of 25 positions shown · non-contrast
Comparison: CT of the neck on [DATE] and prior thyroid
ultrasound on [DATE]

CLINICAL DATA: Incidental on CT. Right thyroid nodule appears
slightly larger by CT compared to prior studies.

EXAM:
THYROID ULTRASOUND
TECHNIQUE: Ultrasound examination of the thyroid gland and adjacent soft
tissues was performed.

[Series 1: us thyroid · 60 acquisitions, 13 frames shown]
[im 1/60]
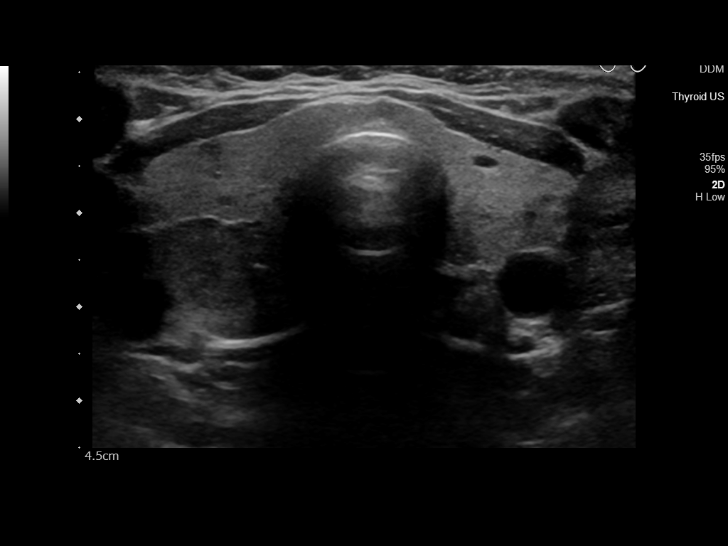
[im 5/60]
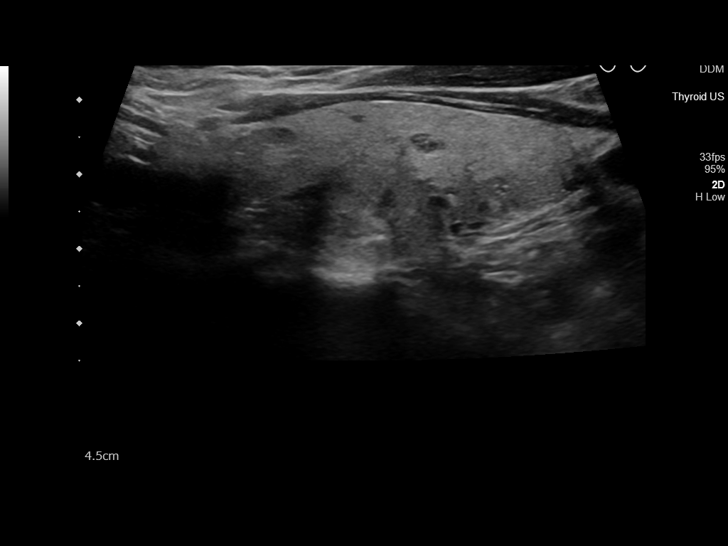
[im 10/60]
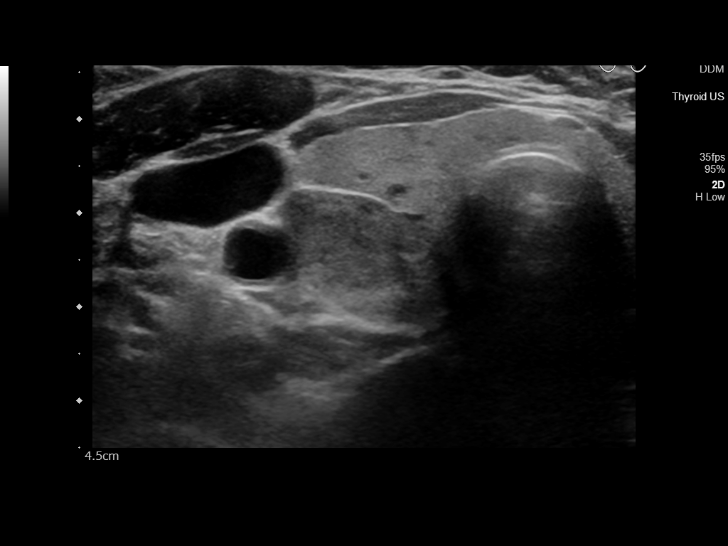
[im 15/60]
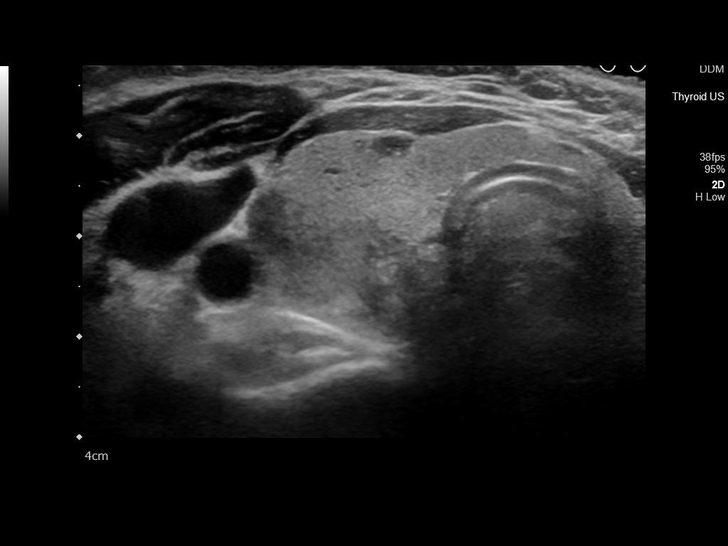
[im 20/60]
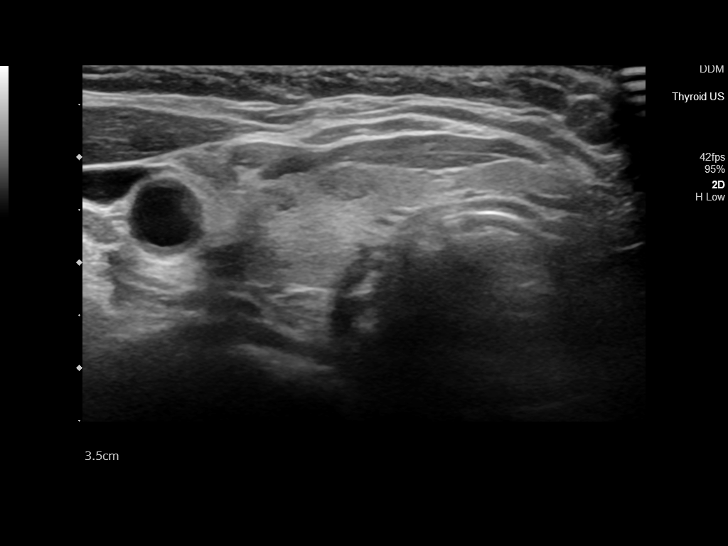
[im 25/60]
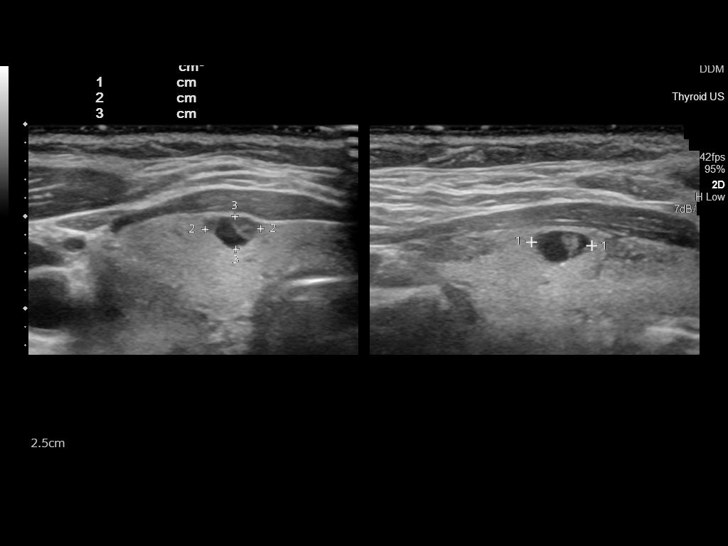
[im 30/60]
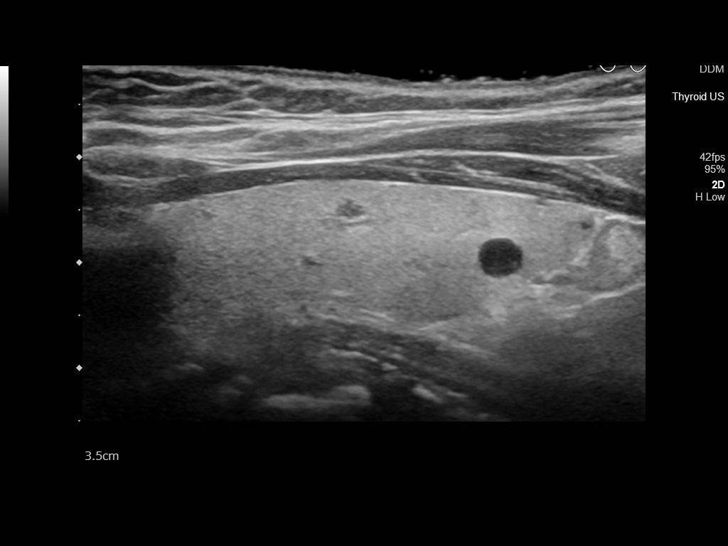
[im 35/60]
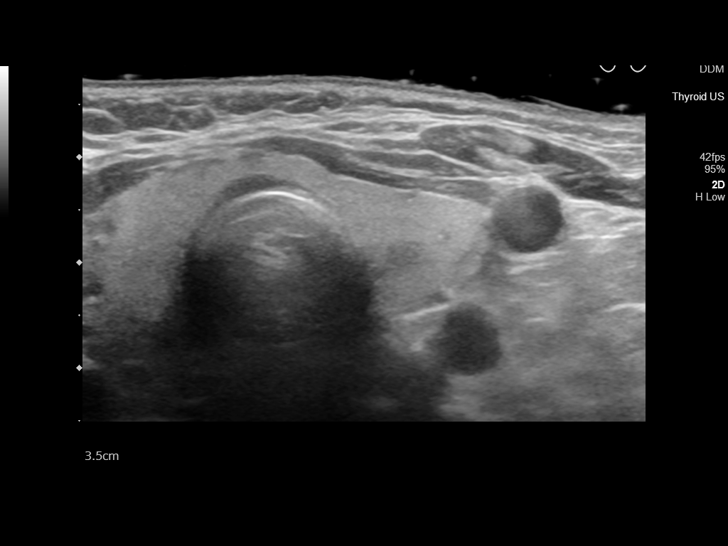
[im 40/60]
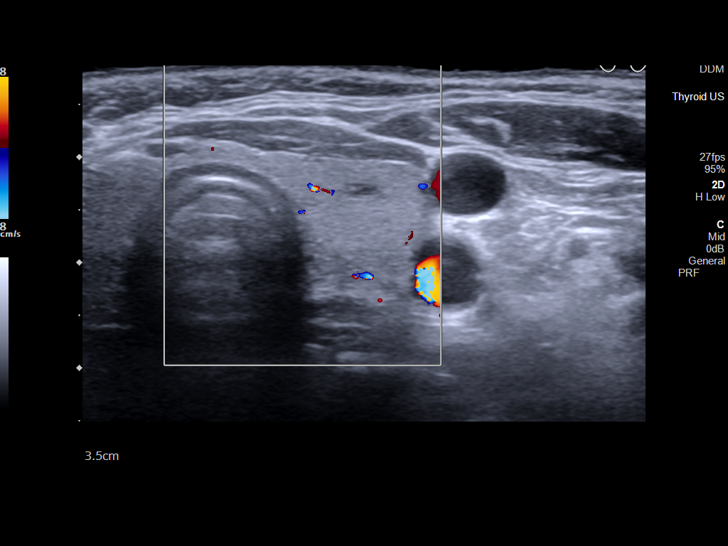
[im 45/60]
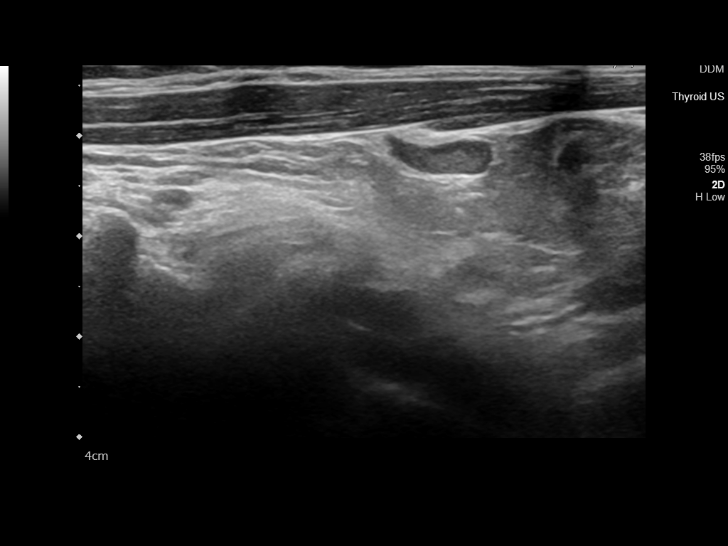
[im 50/60]
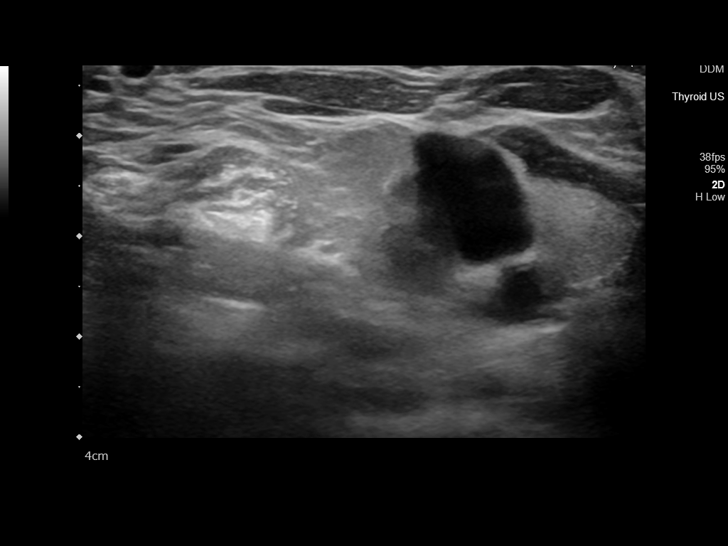
[im 55/60]
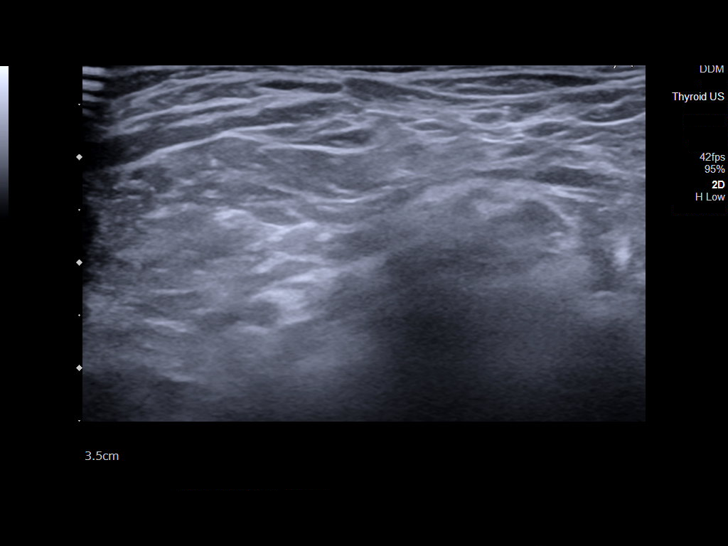
[im 60/60]
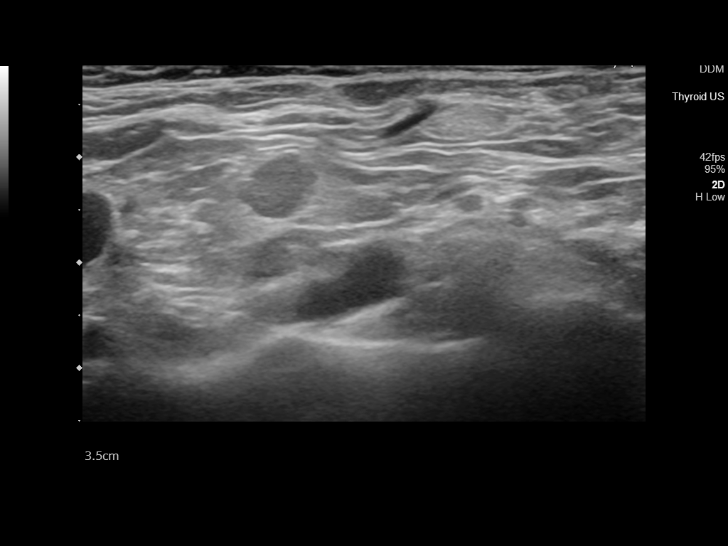

[13 of 25 positions shown; findings below may reference images not displayed]

FINDINGS: Parenchymal Echotexture: Mildly heterogenous

Isthmus: 0.3 cm

Right lobe: 5.0 x 2.3 x 1.9 cm

Left lobe: 4.3 x 1.5 x 1.4 cm

_________________________________________________________

Estimated total number of nodules >/= 1 cm: 1

Number of spongiform nodules >/=  2 cm not described below (TR1): 0

Number of mixed cystic and solid nodules >/= 1.5 cm not described
below (TR2): 0

_________________________________________________________

Nodule # 1:

Prior biopsy: No

Location: Right; Inferior

Maximum size: 1.9 cm; Other 2 dimensions: 1.4 x 1.8 cm, previously,
1.9 cm

Composition: solid/almost completely solid (2)

Echogenicity: isoechoic (1)

Shape: not taller-than-wide (0)

Margins: smooth (0)

Echogenic foci: none (0)

ACR TI-RADS total points: 3.

ACR TI-RADS risk category:  TR3 (3 points).

Significant change in size (>/= 20% in two dimensions and minimal
increase of 2 mm): No

Change in features: No

Change in ACR TI-RADS risk category: No

ACR TI-RADS recommendations:

This nodule is nearly stable and likely slightly larger compared to
the prior ultrasound in [T4]. This would argue for likely benign
etiology. Technically, this nodule would meet criteria for a 1 year
follow-up ultrasound without a prior study. *Given size (>/= 1.5 -
2.4 cm) and appearance, a follow-up ultrasound in 1 year should be
considered based on TI-RADS criteria.

_________________________________________________________

Some other tiny scattered subcentimeter cysts and nodules in both
lobes do not meet criteria for biopsy or dedicated follow-up. No
abnormal lymph nodes identified.
IMPRESSION: A right inferior thyroid nodule measures approximately 1.9 cm in
greatest diameter. Overall volume of the nodule is likely slightly
larger compared to a prior ultrasound in [T4] but the nodule is very
similar in size. Although this would argue for benign etiology,
follow-up ultrasound could be considered.

The above is in keeping with the ACR TI-RADS recommendations - [HOSPITAL] [T4];[DATE].

## 2020-06-14 ENCOUNTER — Telehealth: Payer: Self-pay

## 2020-06-14 NOTE — Telephone Encounter (Signed)
LMTCB for results. 

## 2020-06-15 ENCOUNTER — Telehealth: Payer: Self-pay | Admitting: Family

## 2020-06-15 NOTE — Telephone Encounter (Signed)
Sarah please call dr solum's office at [facility] and ask to speak with dr solum's nurse , please advise that patient has thyroid nodule 1.9 cm which has increased in size  we would like her to evaluate and discuss surveillance with patient. When is patient's next appt?  Also, what is fax so we can fax ultrasound from 06/11/20 to Dr Gabriel Carina.

## 2020-06-15 NOTE — Telephone Encounter (Signed)
I called & message was sent to Dr. Joycie Peek nurse to call me back. Included in note was reason for pt being seen for thyroid nodule that has increased in size.

## 2020-06-19 ENCOUNTER — Ambulatory Visit
Admission: RE | Admit: 2020-06-19 | Discharge: 2020-06-19 | Disposition: A | Discharge status: Home / Self Care | Payer: Medicare HMO | Source: Ambulatory Visit | Attending: Internal Medicine | Admitting: Internal Medicine

## 2020-06-19 ENCOUNTER — Other Ambulatory Visit: Payer: Self-pay

## 2020-06-19 DIAGNOSIS — N63 Unspecified lump in unspecified breast: Secondary | ICD-10-CM

## 2020-06-19 DIAGNOSIS — N6002 Solitary cyst of left breast: Secondary | ICD-10-CM | POA: Diagnosis not present

## 2020-06-19 IMAGING — US US BREAST*L* LIMITED INC AXILLA
1 series · 12 of 12 positions shown · non-contrast
Comparison: Previous exam(s).

CLINICAL DATA: 74-year-old who had a fall in [DATE], injuring
the LEFT breast with bruising of the entire breast. The bruising has
resolved, though the patient still has focal tenderness in the OUTER
breast.

EXAM:
DIGITAL DIAGNOSTIC LEFT MAMMOGRAM WITH CAD AND TOMO
ULTRASOUND LEFT BREAST

[Series 1: us breast*left* limited inc axilla · 0.07mm/px · 12 of 12 slices shown]
[im 1/12]
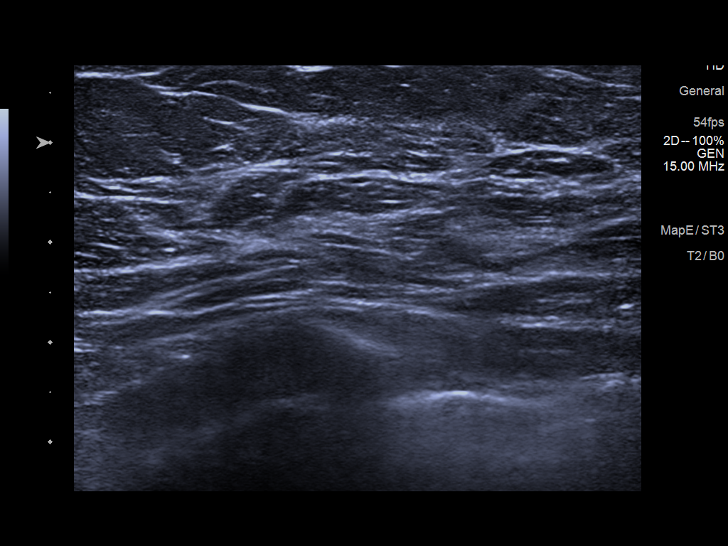
[im 2/12]
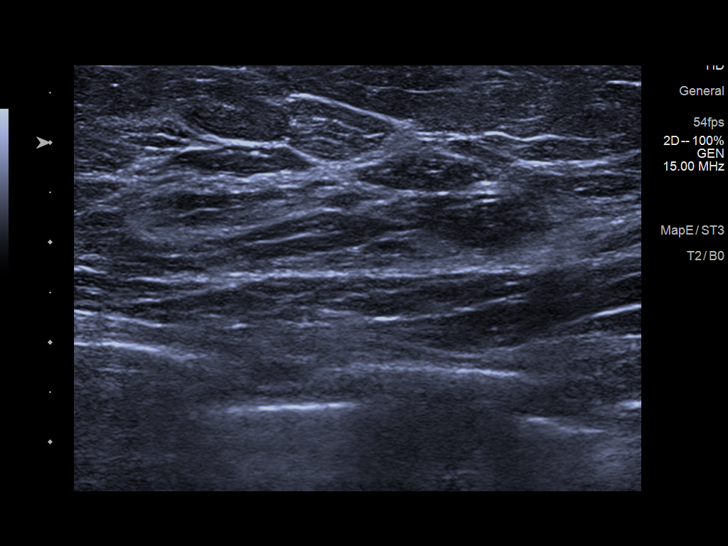
[im 3/12]
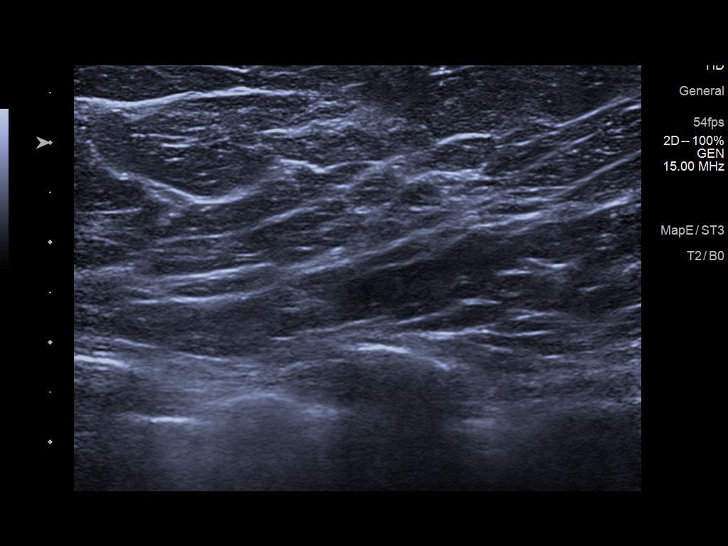
[im 4/12]
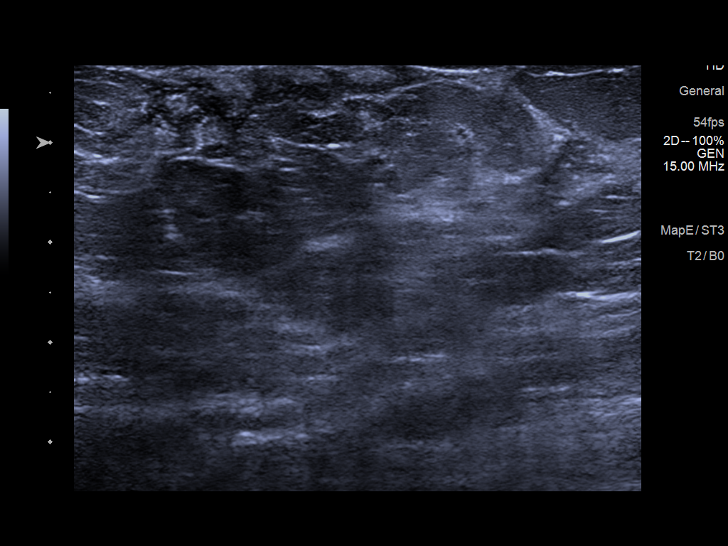
[im 5/12]
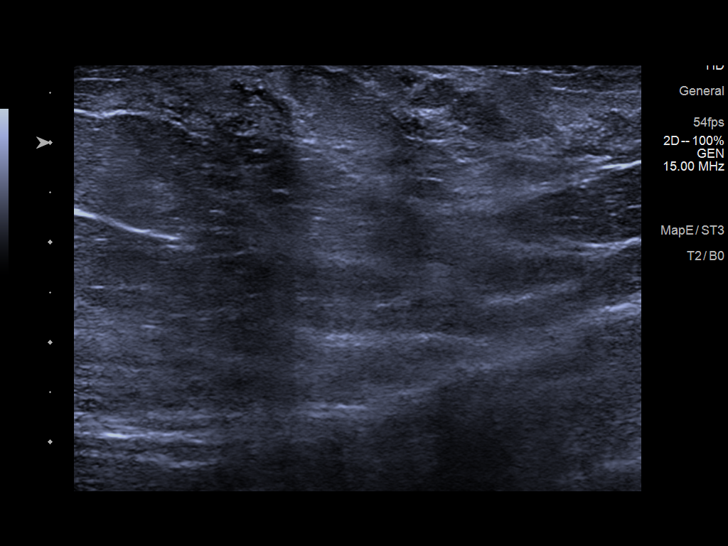
[im 6/12]
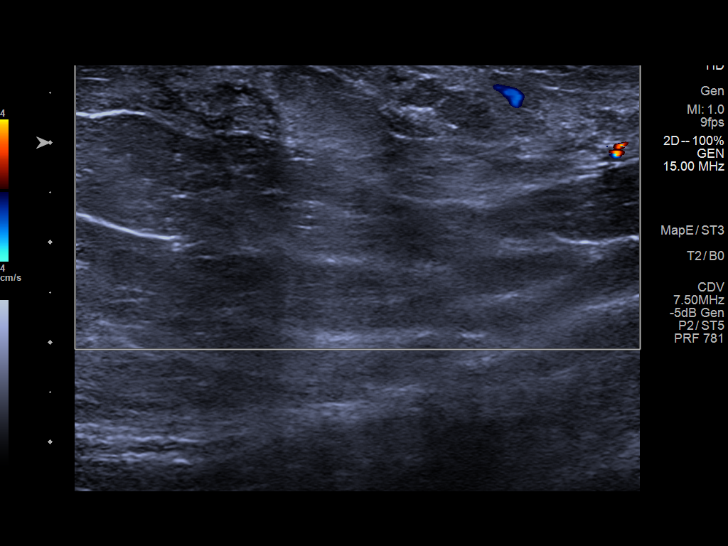
[im 7/12]
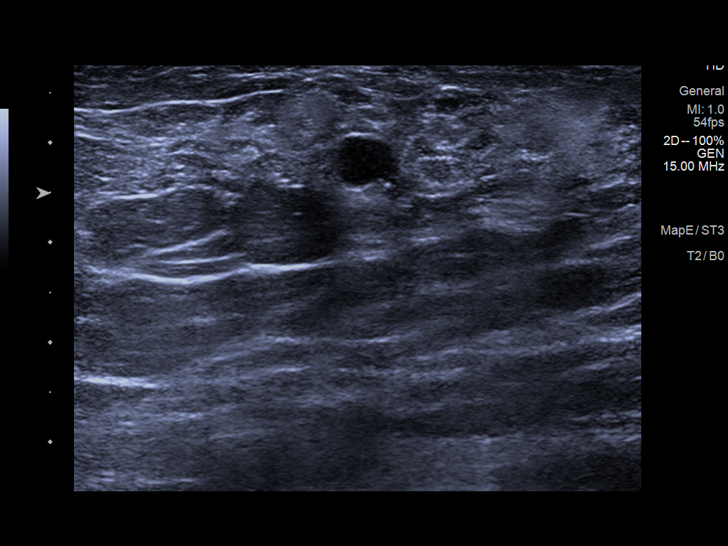
[im 8/12]
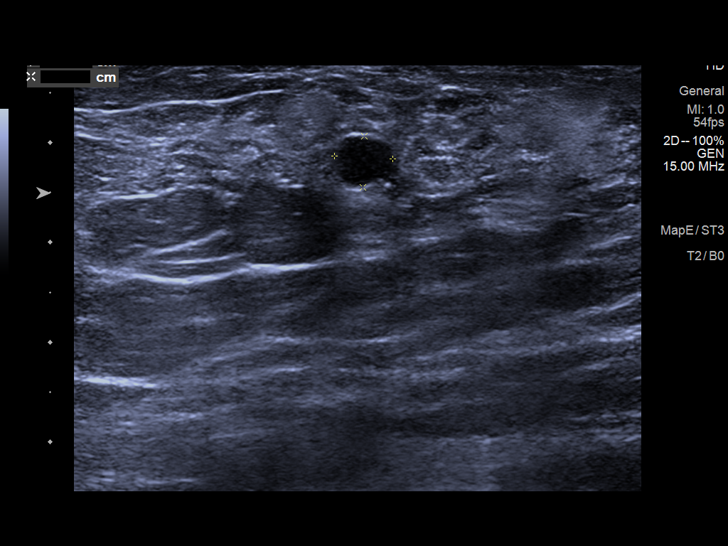
[im 9/12]
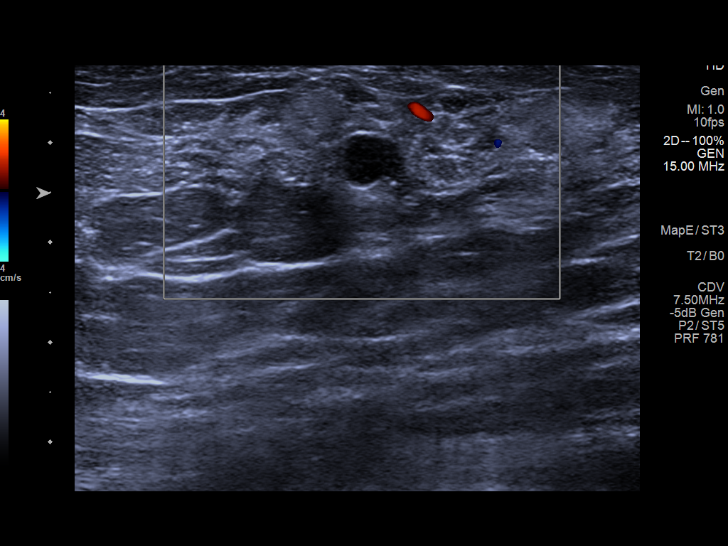
[im 10/12]
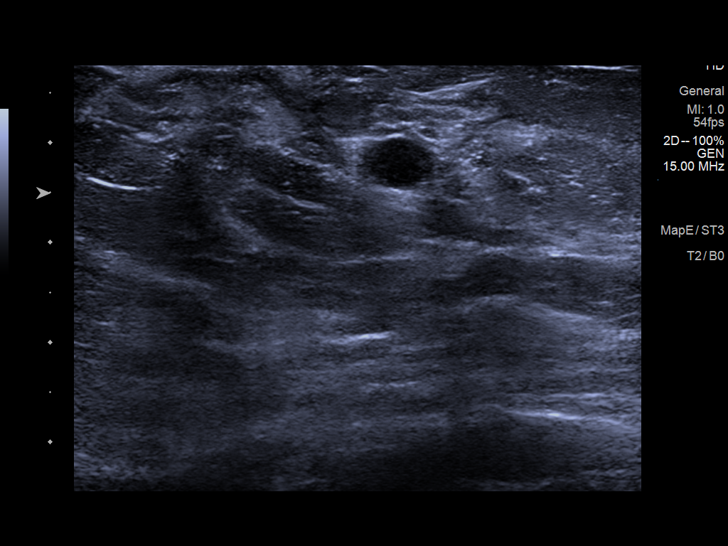
[im 11/12]
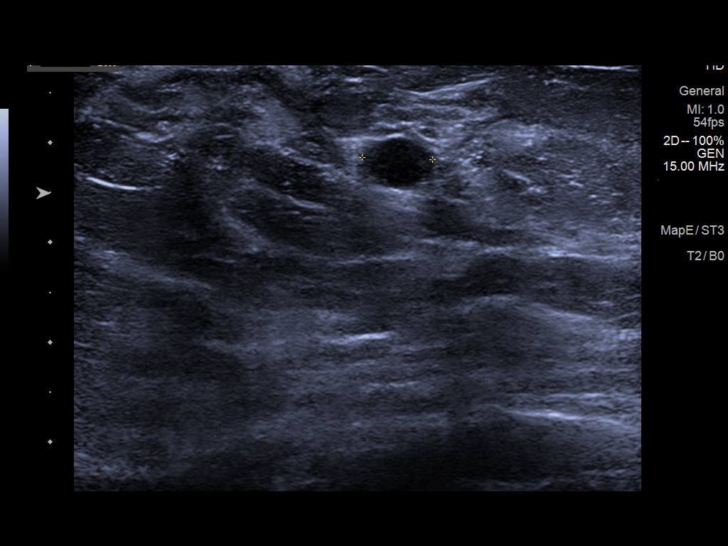
[im 12/12]
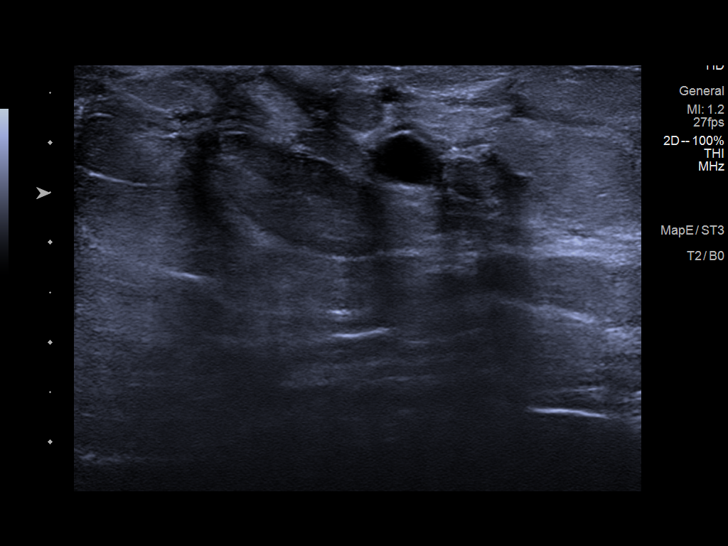

[12 of 12 positions shown; findings below may reference images not displayed]

ACR Breast Density Category b: There are scattered areas of
fibroglandular density.
FINDINGS: Tomosynthesis and synthesized digital 2D full field CC and MLO views
of the LEFT breast were obtained. Tomosynthesis and synthesized spot
compression tangential view of the area of concern in the LEFT
breast was also obtained. Mammographic images were processed with
CAD.

No mammographic abnormality in the area of residual focal tenderness
in the OUTER LEFT breast. Normal scattered fibroglandular tissue
underlies this location, and the breast has not changed in
appearance since the [DATE] screening examination.

No findings suspicious for malignancy in the LEFT breast.

On correlative physical exam, there is no palpable abnormality in
the OUTER LEFT breast in the area of focal tenderness. The patient
describes tenderness to palpation.

Targeted LEFT breast ultrasound is performed, showing normal
scattered fibroglandular tissue at the 2 o'clock position
approximately 4 cm from the nipple in the area of focal tenderness.
Incidental note is made of a benign simple cyst at the 2 o'clock
subareolar location measuring 7 x 6 x 5 mm.
IMPRESSION: 1. No mammographic or sonographic evidence of malignancy involving
the LEFT breast.
2. Incidental benign 7 mm simple cyst in the UPPER OUTER subareolar
LEFT breast.

RECOMMENDATION:
Annual BILATERAL screening mammography which is due in [DATE].

I have discussed the findings and recommendations with the patient.
If applicable, a reminder letter will be sent to the patient
regarding the next appointment.

BI-RADS CATEGORY  2: Benign.

## 2020-06-19 IMAGING — MG MM DIGITAL DIAGNOSTIC UNILAT*L* W/ TOMO W/ CAD
6 series · 6 of 18 positions shown · non-contrast
Comparison: Previous exam(s).

CLINICAL DATA: 74-year-old who had a fall in [DATE], injuring
the LEFT breast with bruising of the entire breast. The bruising has
resolved, though the patient still has focal tenderness in the OUTER
breast.

EXAM:
DIGITAL DIAGNOSTIC LEFT MAMMOGRAM WITH CAD AND TOMO
ULTRASOUND LEFT BREAST

[L CC synth-2D]
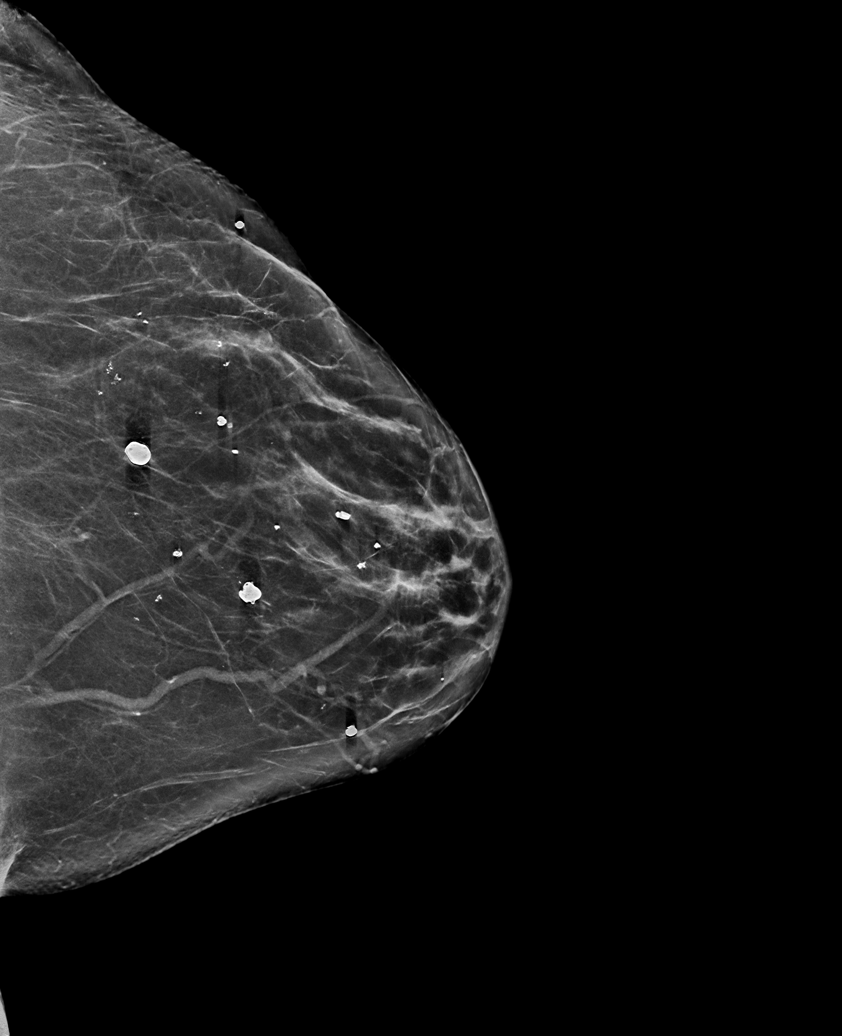

[L TAN synth-2D]
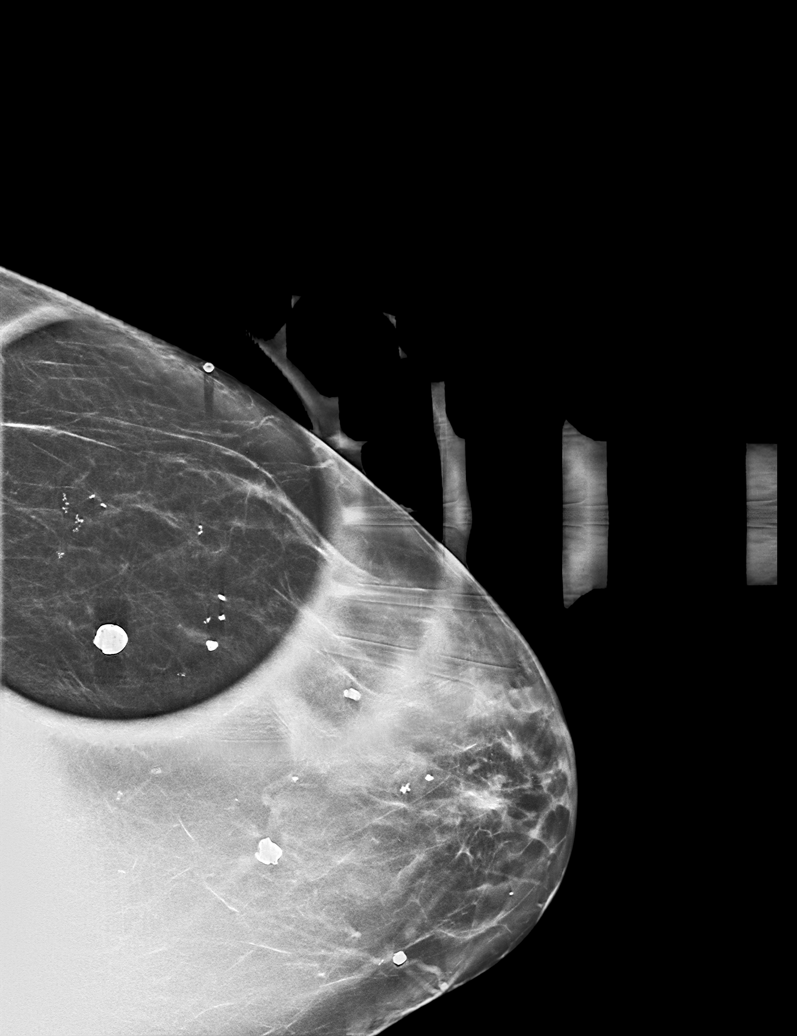

[L MLO synth-2D]
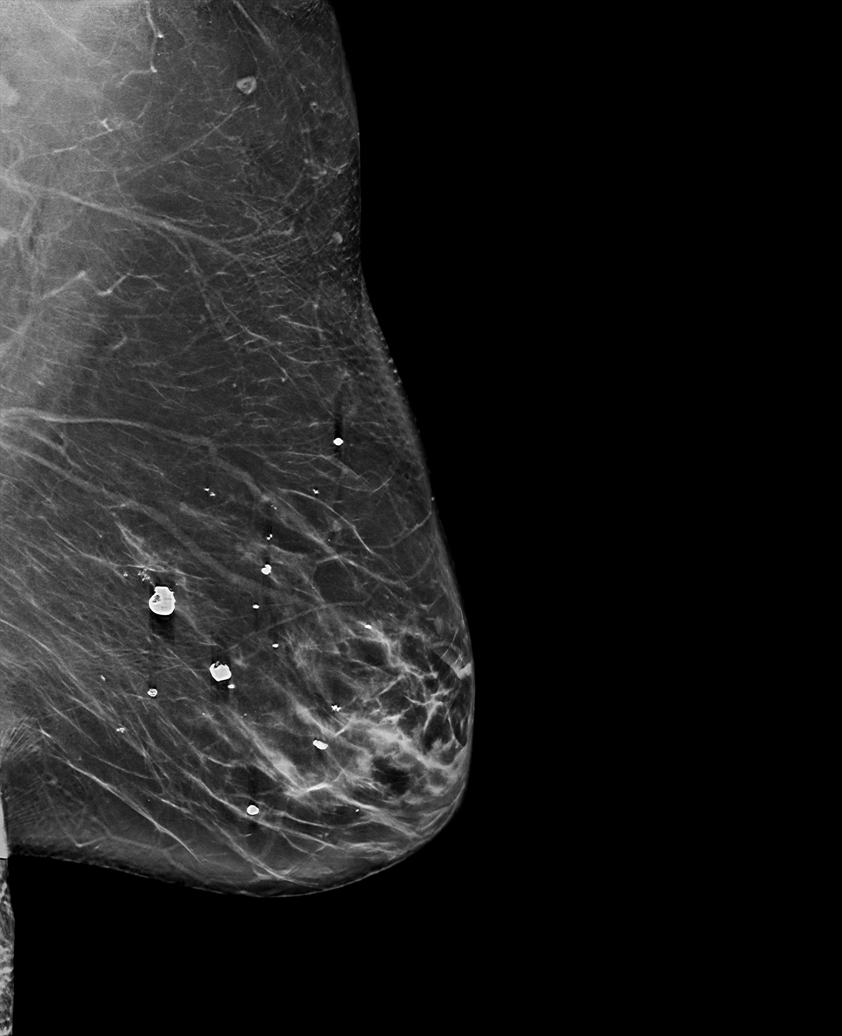

[L TAN tomo · tomo slice 31/60.0]
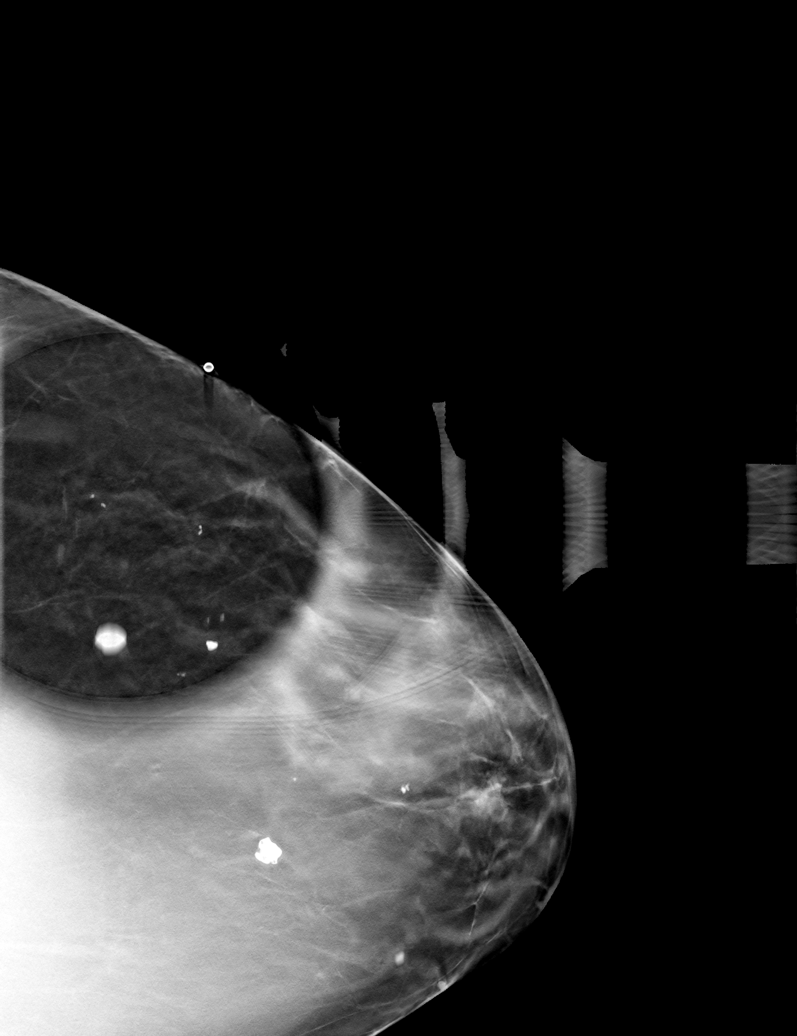

[L MLO tomo · tomo slice 43/84.0]
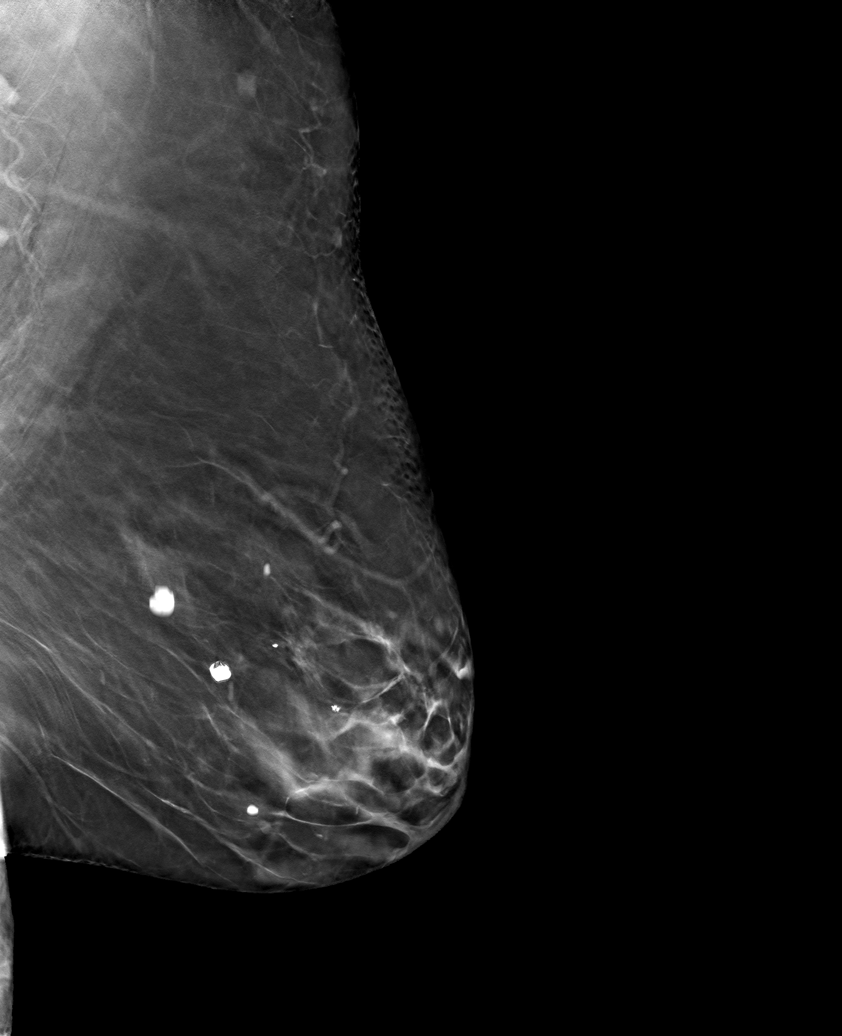

[L CC tomo · tomo slice 37/74.0]
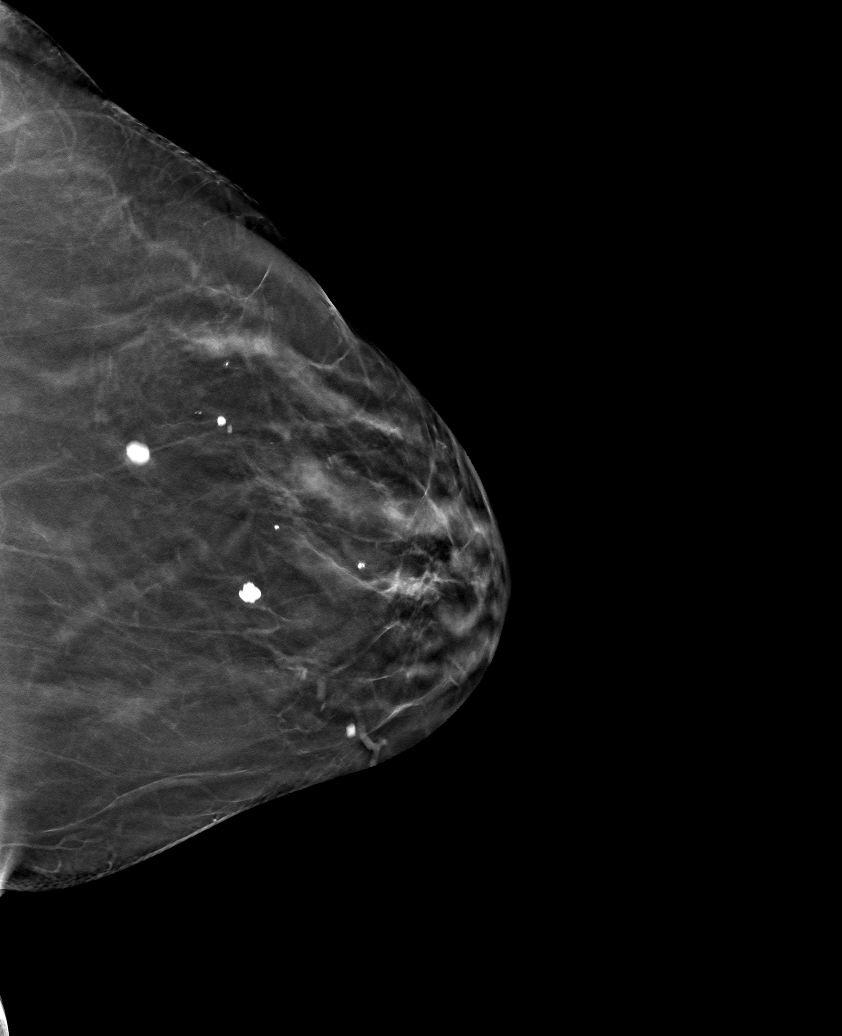

[6 of 18 positions shown; findings below may reference images not displayed]

ACR Breast Density Category b: There are scattered areas of
fibroglandular density.
FINDINGS: Tomosynthesis and synthesized digital 2D full field CC and MLO views
of the LEFT breast were obtained. Tomosynthesis and synthesized spot
compression tangential view of the area of concern in the LEFT
breast was also obtained. Mammographic images were processed with
CAD.

No mammographic abnormality in the area of residual focal tenderness
in the OUTER LEFT breast. Normal scattered fibroglandular tissue
underlies this location, and the breast has not changed in
appearance since the [DATE] screening examination.

No findings suspicious for malignancy in the LEFT breast.

On correlative physical exam, there is no palpable abnormality in
the OUTER LEFT breast in the area of focal tenderness. The patient
describes tenderness to palpation.

Targeted LEFT breast ultrasound is performed, showing normal
scattered fibroglandular tissue at the 2 o'clock position
approximately 4 cm from the nipple in the area of focal tenderness.
Incidental note is made of a benign simple cyst at the 2 o'clock
subareolar location measuring 7 x 6 x 5 mm.
IMPRESSION: 1. No mammographic or sonographic evidence of malignancy involving
the LEFT breast.
2. Incidental benign 7 mm simple cyst in the UPPER OUTER subareolar
LEFT breast.

RECOMMENDATION:
Annual BILATERAL screening mammography which is due in [DATE].

I have discussed the findings and recommendations with the patient.
If applicable, a reminder letter will be sent to the patient
regarding the next appointment.

BI-RADS CATEGORY  2: Benign.

## 2020-06-19 NOTE — Telephone Encounter (Signed)
Please circle back here Dr Nicki Reaper gave me another number to try [phone] Ask for when Alexis Reed next appt is with dr Gabriel Carina. If within the next several weeks okay, otherwise we will need to move up to discuss thyroid nodule.    ask for her fax so we can fax over ultrasound of > 1cm thyroid nodule 06/11/20

## 2020-06-20 ENCOUNTER — Encounter: Payer: Self-pay | Admitting: Family

## 2020-06-20 NOTE — Telephone Encounter (Signed)
I spoke with [facility] End[facility] front desk. She could see in Epic that I had called & message had been sent to Dr. Joycie Peek nurse. It was stated that they needed imagining report. I was given direct fax & have faxed to their office. Pt sees Dr. Gabriel Carina 07/16/20.

## 2020-06-22 DIAGNOSIS — S83231A Complex tear of medial meniscus, current injury, right knee, initial encounter: Secondary | ICD-10-CM | POA: Insufficient documentation

## 2020-06-25 ENCOUNTER — Other Ambulatory Visit: Payer: Self-pay | Admitting: Surgery

## 2020-06-25 DIAGNOSIS — M1711 Unilateral primary osteoarthritis, right knee: Secondary | ICD-10-CM

## 2020-06-25 DIAGNOSIS — S83231A Complex tear of medial meniscus, current injury, right knee, initial encounter: Secondary | ICD-10-CM

## 2020-07-01 ENCOUNTER — Other Ambulatory Visit: Payer: Self-pay

## 2020-07-01 ENCOUNTER — Ambulatory Visit
Admission: RE | Admit: 2020-07-01 | Discharge: 2020-07-01 | Disposition: A | Discharge status: Home / Self Care | Payer: Medicare HMO | Source: Ambulatory Visit | Attending: Surgery | Admitting: Surgery

## 2020-07-01 DIAGNOSIS — M1711 Unilateral primary osteoarthritis, right knee: Secondary | ICD-10-CM

## 2020-07-01 DIAGNOSIS — S83231A Complex tear of medial meniscus, current injury, right knee, initial encounter: Secondary | ICD-10-CM | POA: Diagnosis present

## 2020-07-01 IMAGING — MR MR KNEE*R* W/O CM
7 series · 40 of 40 positions shown · non-contrast
Comparison: MRI right knee [DATE].

CLINICAL DATA: Patient status post partial lateral meniscectomy and
chondroplasty of the lateral femoral condyle [DATE]. The patient
suffered a recent twisting injury in the shower with onset pain.

EXAM:
MRI OF THE RIGHT KNEE WITHOUT CONTRAST
TECHNIQUE: Multiplanar, multisequence MR imaging of the knee was performed. No
intravenous contrast was administered.

[Series 8: T2 fat-sat · axial · right · 4.0mm · 0.50mm/px · z∈[-23,+100]mm · 6 of 26 slices shown (1 of 3)]
[im 1/26]
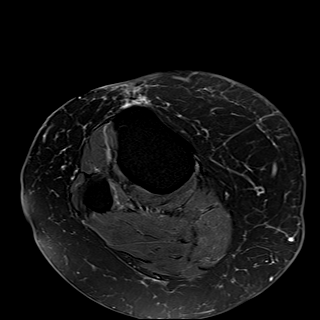
[im 6/26]
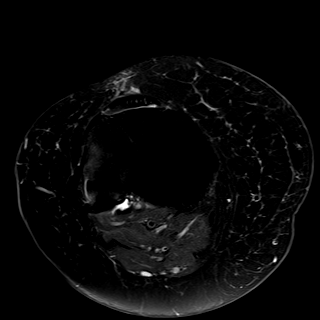
[im 11/26]
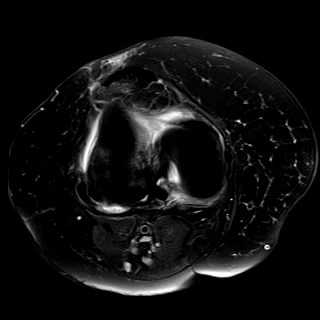
[im 16/26]
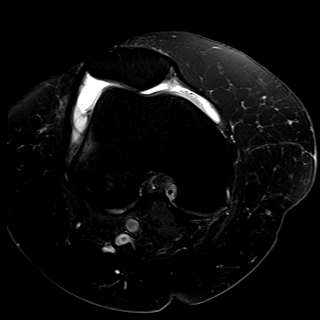
[im 21/26]
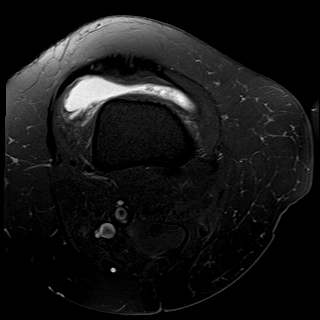
[im 26/26]
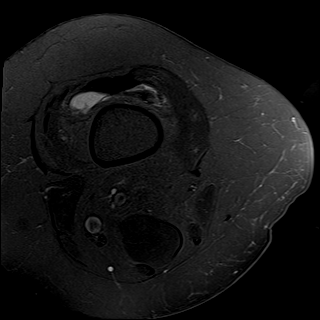

[Series 9: T2 fat-sat · coronal · right · 4.0mm · 0.59mm/px · 6 of 30 slices shown (2 of 3)]
[im 1/30]
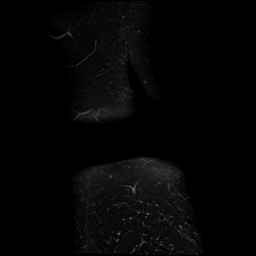
[im 6/30]
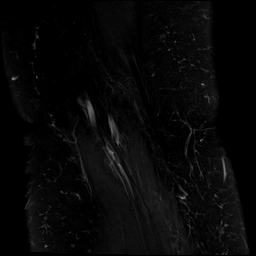
[im 12/30]
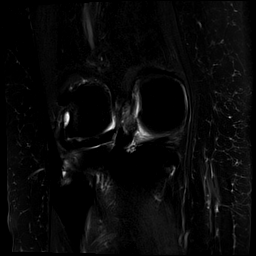
[im 18/30]
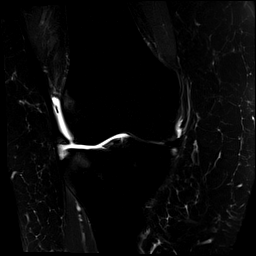
[im 24/30]
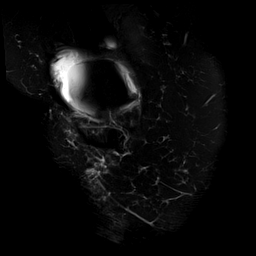
[im 30/30]
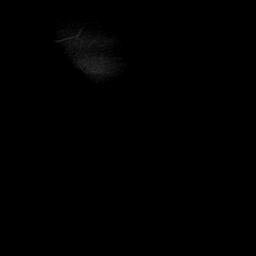

[Series 10: T1 · coronal · right · 4.0mm · 0.59mm/px · 6 of 30 slices shown]
[im 1/30]
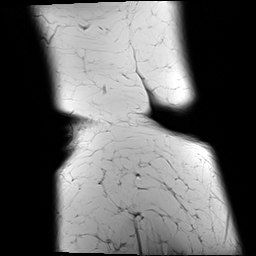
[im 6/30]
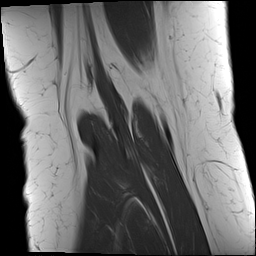
[im 12/30]
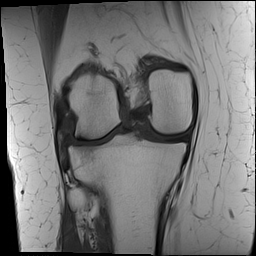
[im 18/30]
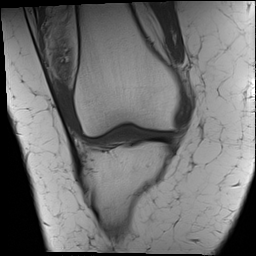
[im 24/30]
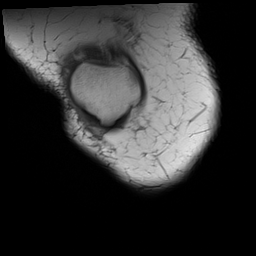
[im 30/30]
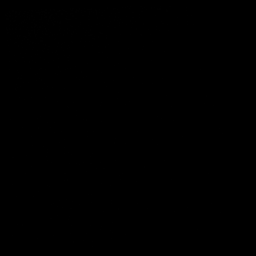

[Series 11: PD fat-sat · coronal · right · 4.0mm · 0.59mm/px · 6 of 30 slices shown (1 of 2)]
[im 1/30]
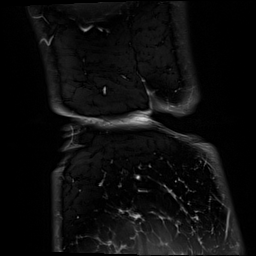
[im 6/30]
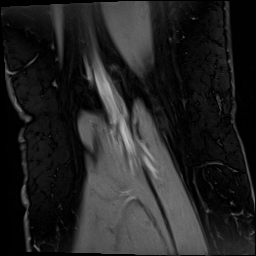
[im 12/30]
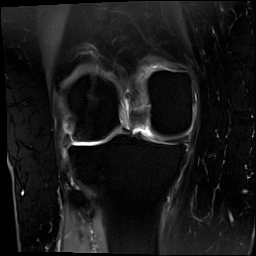
[im 18/30]
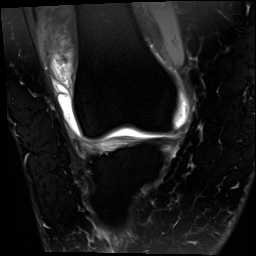
[im 24/30]
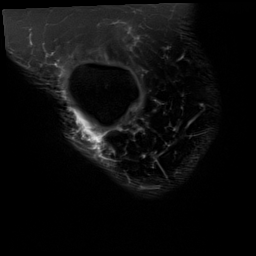
[im 30/30]
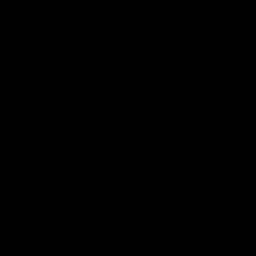

[Series 12: PD fat-sat · sagittal · right · 3.0mm · 0.59mm/px · 6 of 31 slices shown (2 of 2)]
[im 1/31]
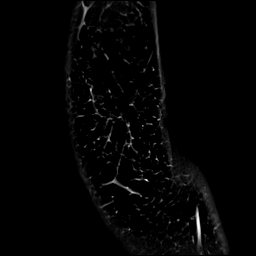
[im 7/31]
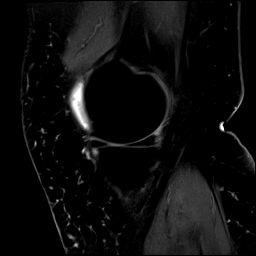
[im 13/31]
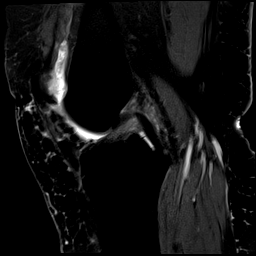
[im 19/31]
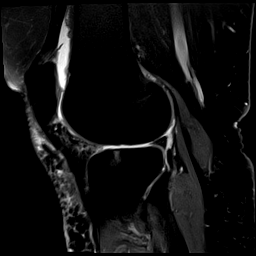
[im 25/31]
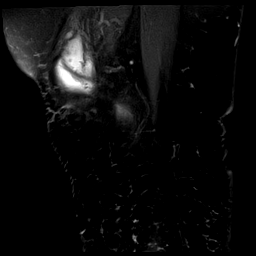
[im 31/31]
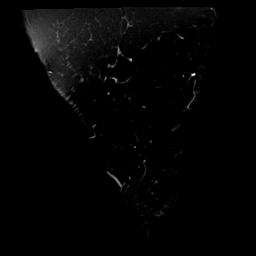

[Series 13: T2 fat-sat · sagittal · right · 3.0mm · 0.59mm/px · 7 of 36 slices shown (3 of 3)]
[im 1/36]
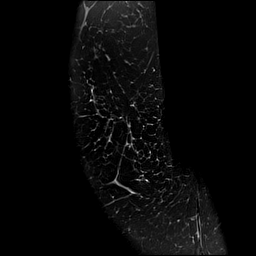
[im 6/36]
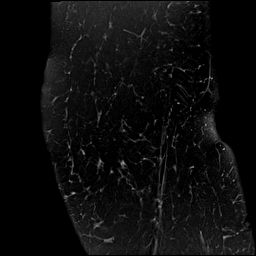
[im 12/36]
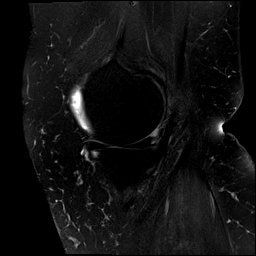
[im 18/36]
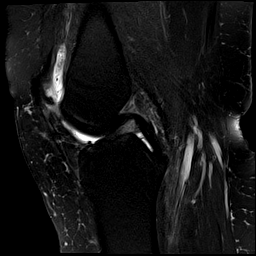
[im 24/36]
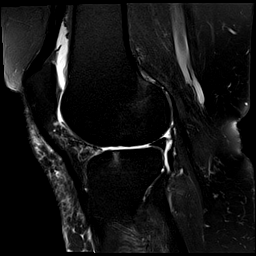
[im 30/36]
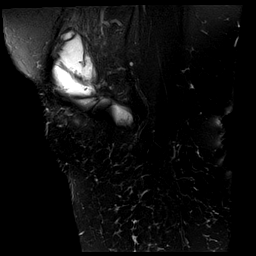
[im 36/36]
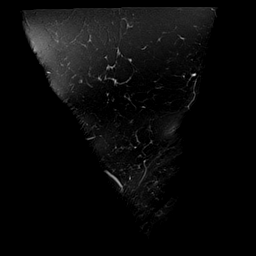

[Series 14: PD · oblique · right · 2.0mm · 0.47mm/px · 3 of 16 slices shown]
[im 1/16]
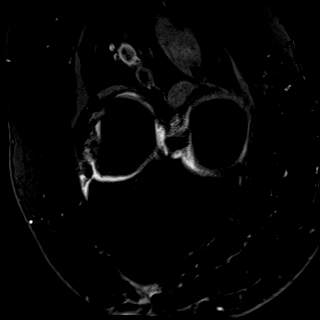
[im 8/16]
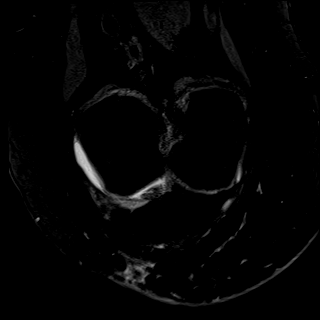
[im 16/16]
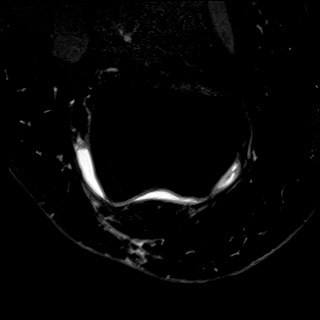

[40 of 40 positions shown; findings below may reference images not displayed]

FINDINGS: MENISCI

Medial meniscus:  Intact.

Lateral meniscus: Virtually no anterior horn of the lateral meniscus
is identified and the body is markedly diminutive consistent with
debridement. Remnant of the body is extruded peripherally. No tear.

LIGAMENTS

Cruciates:  Intact.

Collaterals:  Intact.

CARTILAGE

Patellofemoral:  Mildly degenerated.

Medial:  Preserved.

Lateral:  Thinned without focal defect.

Joint:  Moderate joint effusion contains some debris.

Popliteal Fossa:  No Baker's cyst.

Extensor Mechanism:  Intact.

Bones: Mild subchondral edema about the lateral compartment appears
mildly improved compared to the prior examination. No new bony
abnormality. Small lateral compartment osteophytes noted.

Other: None.
IMPRESSION: No new abnormality since the prior examination.

Status post lateral meniscectomy. Remnant of the body is
peripherally displaced but no tear is seen.

Subchondral edema about the lateral compartment persists but has
mildly improved.

## 2020-07-03 ENCOUNTER — Ambulatory Visit: Payer: Medicare HMO | Admitting: Internal Medicine

## 2020-07-16 ENCOUNTER — Encounter: Payer: Self-pay | Admitting: Internal Medicine

## 2020-07-16 ENCOUNTER — Telehealth: Payer: Self-pay

## 2020-07-16 NOTE — Telephone Encounter (Signed)
Pt said she is getting a UTI and she wants Dr. Lorin Picket to put in an order so she can drop off a urine sample? We do not have any available appointments until Thursday with any providers.

## 2020-07-16 NOTE — Telephone Encounter (Signed)
Patient is going to come in tomorrow for urine and do virtual on Wednesday,

## 2020-07-17 ENCOUNTER — Telehealth: Payer: Self-pay

## 2020-07-17 ENCOUNTER — Other Ambulatory Visit: Payer: Self-pay

## 2020-07-17 ENCOUNTER — Other Ambulatory Visit (INDEPENDENT_AMBULATORY_CARE_PROVIDER_SITE_OTHER): Payer: Medicare HMO

## 2020-07-17 DIAGNOSIS — R300 Dysuria: Secondary | ICD-10-CM

## 2020-07-17 DIAGNOSIS — E7800 Pure hypercholesterolemia, unspecified: Secondary | ICD-10-CM

## 2020-07-17 LAB — URINALYSIS, ROUTINE W REFLEX MICROSCOPIC
Bilirubin Urine: NEGATIVE
Hgb urine dipstick: NEGATIVE
Ketones, ur: NEGATIVE
Nitrite: POSITIVE — AB
RBC / HPF: NONE SEEN (ref 0–?)
Specific Gravity, Urine: >=1.03 — AB (ref 1.000–1.030)
Total Protein, Urine: NEGATIVE
Urine Glucose: NEGATIVE
Urobilinogen, UA: 0.2 (ref 0.0–1.0)
pH: 5 (ref 5.0–8.0)

## 2020-07-17 NOTE — Addendum Note (Signed)
Addended by: Hulan Fray on: 07/17/2020 02:35 PM   Modules accepted: Orders

## 2020-07-17 NOTE — Telephone Encounter (Signed)
Orders placed for urine.

## 2020-07-17 NOTE — Addendum Note (Signed)
Addended by: Warden Fillers on: 07/17/2020 02:36 PM   Modules accepted: Orders

## 2020-07-18 ENCOUNTER — Encounter: Payer: Self-pay | Admitting: Internal Medicine

## 2020-07-18 ENCOUNTER — Telehealth (INDEPENDENT_AMBULATORY_CARE_PROVIDER_SITE_OTHER): Payer: Medicare HMO | Admitting: Internal Medicine

## 2020-07-18 DIAGNOSIS — E041 Nontoxic single thyroid nodule: Secondary | ICD-10-CM | POA: Diagnosis not present

## 2020-07-18 DIAGNOSIS — N3000 Acute cystitis without hematuria: Secondary | ICD-10-CM

## 2020-07-18 DIAGNOSIS — Q740 Other congenital malformations of upper limb(s), including shoulder girdle: Secondary | ICD-10-CM

## 2020-07-18 DIAGNOSIS — Q179 Congenital malformation of ear, unspecified: Secondary | ICD-10-CM | POA: Diagnosis not present

## 2020-07-18 MED ORDER — NITROFURANTOIN MONOHYD MACRO 100 MG PO CAPS: 100.0000 mg | ORAL_CAPSULE | Freq: Two times a day (BID) | ORAL | 0 refills | Status: DC

## 2020-07-18 NOTE — Assessment & Plan Note (Signed)
Density noted on CT scan - canal.  Will have ENT review scan and evaluate to confirm no further w/up warranted.

## 2020-07-18 NOTE — Assessment & Plan Note (Signed)
With dysuria and lower abdominal pressure.  Urinalysis appears to be c/w UTI.  Treat with macrobid as directed.  (had facial swelling with PCN).  Last culture - resistant to sulfa.  Discussed recurring infections.  She plans to reschedule her appt with urology.  Probiotic as directed.  Follow.

## 2020-07-18 NOTE — Assessment & Plan Note (Signed)
Soft tissue fullness above clavicle.  CT reviewed.  Given persistence, discussed ENT referral.  Pt agreeable.

## 2020-07-18 NOTE — Progress Notes (Signed)
Patient ID: Alexis Reed, female   DOB: [birth year], 75 y.o.   MRN: [id number]   Virtual Visit via virtual Note  This visit type was conducted due to national recommendations for restrictions regarding the COVID-19 pandemic (e.g. social distancing).  This format is felt to be most appropriate for this patient at this time.  All issues noted in this document were discussed and addressed.  No physical exam was performed (except for noted visual exam findings with Video Visits).   I connected with [name] by a video enabled telemedicine application and verified that I am speaking with the correct person using two identifiers. Location patient: home Location provider: work  Persons participating in the virtual visit: patient, provider  The limitations, risks, security and privacy concerns of performing an evaluation and management service by video and the availability of in person appointments have been discussed. it has also been discussed with the patient that there may be a patient responsible charge related to this service. The patient expressed understanding and agreed to proceed.   Reason for visit: work in appt  HPI: Work in for possible urinary tract infection.  States started having symptoms last week.  Resolved without intervention.  Reports that symptoms returned yesterday - dysuria and increased frequency.  Also some lower abdominal pressure and discomfort.  No fever.  No nausea or vomiting.  Eating.  Good appetite.  No hematuria.  No vaginal discharge or irritation.  S/p knee surgery in 04/2020.  Had post op f/u appt.  S/p injection.  No pain now.  Walking.  No limp now.  Had to cancel her appt with urology secondary to her knee surgery.  Plans to reschedule.  Recently underwent evaluation for soft tissue mass - neck.  CT reviewed.  No acute abnormality note.  Thyroid nodule present.  Saw endocrinology.  Recommended f/u for thyroid nodule.  Soft tissue mass still present.  Not  enlarging. No pain. Discussed ENT evaluation.     ROS: See pertinent positives and negatives per HPI.  Past Medical History:  Diagnosis Date  . Arthritis   . Benign breast lumps    multiple lumps biopsy and remove x's 5 last being 1979 by Dr. Sharlet Salina  . COPD (chronic obstructive pulmonary disease) (HCC)    MILD-RARELY EVER HAS TO USE COMBIVENT INHALER  . Fibroids    s/p hysterectomy  . GERD (gastroesophageal reflux disease)   . Hypercholesterolemia   . Osteoporosis   . Personal history of tobacco use, presenting hazards to health 08/07/2015    Past Surgical History:  Procedure Laterality Date  . ABDOMINAL HYSTERECTOMY  1997   with bilateral oophorectomy  . BREAST BIOPSY Left    multiple done  . BREAST BIOPSY Right 2016   stereo- neg. FC changes  . BREAST EXCISIONAL BIOPSY Right 1975   multiple biopsies done  . BREAST SURGERY Left    lump removed. Unsure of date  . BREAST SURGERY Right    lump removed. Unsure of date  . COLONOSCOPY  2010   Dr. Vira Agar  . KNEE ARTHROSCOPY WITH LATERAL MENISECTOMY Right 05/08/2020   Procedure: RIGHT KNEE ARTHROSCOPY WITH DEBRIDEMENT AND PARTIAL LATERAL MENISCECTOMY;  Surgeon: Corky Mull, MD;  Location: [facility];  Service: Orthopedics;  Laterality: Right;  . LAPAROSCOPIC APPENDECTOMY N/A 12/21/2018   Procedure: APPENDECTOMY LAPAROSCOPIC CONVERTED TO OPEN;  Surgeon: Benjamine Sprague, DO;  Location: [facility];  Service: General;  Laterality: N/A;  . TONSILLECTOMY      Family History  Problem Relation Age of Onset  . Hyperlipidemia Mother   . Thyroid disease Mother   . Hypertension Mother   . Colon cancer Father   . Stroke Father   . Heart disease Father        myocardial infarction  . Breast cancer Neg Hx     SOCIAL HX: reviewed.    Current Outpatient Medications:  .  nitrofurantoin, macrocrystal-monohydrate, (MACROBID) 100 MG capsule, Take 1 capsule (100 mg total) by mouth 2 (two) times daily., Disp: 14 capsule, Rfl: 0 .   acetaminophen (TYLENOL) 500 MG tablet, Take 1,000 mg by mouth every 6 (six) hours as needed for moderate pain., Disp: , Rfl:  .  Calcium Carb-Cholecalciferol (CALCIUM 500 +D) 500-400 MG-UNIT TABS, Take 1 tablet by mouth daily., Disp: , Rfl:  .  Cholecalciferol (VITAMIN D) 2000 UNITS tablet, Take 2,000 Units by mouth daily., Disp: , Rfl:  .  denosumab (PROLIA) 60 MG/ML SOSY injection, Inject 60 mg into the skin every 6 (six) months. , Disp: , Rfl:  .  HYDROcodone-acetaminophen (NORCO) 5-325 MG tablet, Take 1-2 tablets by mouth every 6 (six) hours as needed for moderate pain or severe pain. MAXIMUM TOTAL ACETAMINOPHEN DOSE IS 4000 MG PER DAY, Disp: 30 tablet, Rfl: 0 .  ibuprofen (ADVIL) 800 MG tablet, Take 1 tablet (800 mg total) by mouth every 8 (eight) hours as needed for mild pain or moderate pain., Disp: 30 tablet, Rfl: 0 .  Ipratropium-Albuterol (COMBIVENT RESPIMAT) 20-100 MCG/ACT AERS respimat, Inhale 1 puff into the lungs every 6 (six) hours as needed for wheezing., Disp: 1 each, Rfl: 1 .  omeprazole (PRILOSEC) 20 MG capsule, Take 1 capsule (20 mg total) by mouth daily. (Patient taking differently: Take 20 mg by mouth every morning. ), Disp: 90 capsule, Rfl: 1 .  pravastatin (PRAVACHOL) 10 MG tablet, Take one tablet 5 days per week. (Patient taking differently: Take 10 mg by mouth every Monday, Tuesday, Wednesday, Thursday, and Friday. AM), Disp: 60 tablet, Rfl: 1 .  Probiotic Product (PROBIOTIC DAILY PO), Take 1 capsule by mouth daily., Disp: , Rfl:   EXAM:  GENERAL: alert, oriented, appears well and in no acute distress  HEENT: atraumatic, conjunttiva clear, no obvious abnormalities on inspection of external nose and ears  NECK: normal movements of the head and neck  LUNGS: on inspection no signs of respiratory distress, breathing rate appears normal, no obvious gross SOB, gasping or wheezing  CV: no obvious cyanosis  PSYCH/NEURO: pleasant and cooperative, no obvious depression or  anxiety, speech and thought processing grossly intact  ASSESSMENT AND PLAN:  Discussed the following assessment and plan:  Problem List Items Addressed This Visit    UTI (urinary tract infection)     With dysuria and lower abdominal pressure.  Urinalysis appears to be c/w UTI.  Treat with macrobid as directed.  (had facial swelling with PCN).  Last culture - resistant to sulfa.  Discussed recurring infections.  She plans to reschedule her appt with urology.  Probiotic as directed.  Follow.        Relevant Medications   nitrofurantoin, macrocrystal-monohydrate, (MACROBID) 100 MG capsule   Asymmetry of clavicles    Soft tissue fullness above clavicle.  CT reviewed.  Given persistence, discussed ENT referral.  Pt agreeable.        Relevant Orders   Ambulatory referral to ENT   Thyroid nodule    Saw endocrinology.  Felt relatively stable.  No biopsy recommended.  Recommended f/u ultrasound in one year.  Ear anomaly    Density noted on CT scan - canal.  Will have ENT review scan and evaluate to confirm no further w/up warranted.        Relevant Orders   Ambulatory referral to ENT       I discussed the assessment and treatment plan with the patient. The patient was provided an opportunity to ask questions and all were answered. The patient agreed with the plan and demonstrated an understanding of the instructions.   The patient was advised to call back or seek an in-person evaluation if the symptoms worsen or if the condition fails to improve as anticipated.    Einar Pheasant, MD

## 2020-07-18 NOTE — Assessment & Plan Note (Signed)
Saw endocrinology.  Felt relatively stable.  No biopsy recommended.  Recommended f/u ultrasound in one year.

## 2020-07-19 LAB — URINE CULTURE
MICRO NUMBER:: 11380232
SPECIMEN QUALITY:: ADEQUATE

## 2020-08-06 ENCOUNTER — Other Ambulatory Visit: Payer: Self-pay

## 2020-08-06 DIAGNOSIS — N390 Urinary tract infection, site not specified: Secondary | ICD-10-CM

## 2020-08-07 ENCOUNTER — Other Ambulatory Visit: Payer: Self-pay

## 2020-08-07 ENCOUNTER — Other Ambulatory Visit
Admission: RE | Admit: 2020-08-07 | Discharge: 2020-08-07 | Disposition: A | Discharge status: Home / Self Care | Payer: Medicare HMO | Attending: Urology | Admitting: Urology

## 2020-08-07 ENCOUNTER — Encounter: Payer: Self-pay | Admitting: Urology

## 2020-08-07 ENCOUNTER — Ambulatory Visit: Payer: Medicare HMO | Admitting: Urology

## 2020-08-07 VITALS — BP 117/81 | HR 77 | Ht 64.0 in | Wt 191.6 lb

## 2020-08-07 DIAGNOSIS — N390 Urinary tract infection, site not specified: Secondary | ICD-10-CM | POA: Diagnosis not present

## 2020-08-07 LAB — URINALYSIS, COMPLETE (UACMP) WITH MICROSCOPIC
Glucose, UA: NEGATIVE mg/dL
Hgb urine dipstick: NEGATIVE
Leukocytes,Ua: NEGATIVE
Nitrite: NEGATIVE
Protein, ur: NEGATIVE mg/dL
RBC / HPF: NONE SEEN RBC/hpf (ref 0–5)
Specific Gravity, Urine: >1.03 — ABNORMAL HIGH (ref 1.005–1.030)
pH: 5 (ref 5.0–8.0)

## 2020-08-07 MED ORDER — NITROFURANTOIN MACROCRYSTAL 50 MG PO CAPS: 50.0000 mg | ORAL_CAPSULE | Freq: Every day | ORAL | 2 refills | Status: DC

## 2020-08-07 MED ORDER — ESTRADIOL 0.1 MG/GM VA CREA: TOPICAL_CREAM | VAGINAL | 6 refills | Status: DC

## 2020-08-07 MED ORDER — NITROFURANTOIN MONOHYD MACRO 100 MG PO CAPS: 100.0000 mg | ORAL_CAPSULE | Freq: Two times a day (BID) | ORAL | 0 refills | Status: DC

## 2020-08-07 NOTE — Progress Notes (Signed)
08/07/20 11:29 AM   Alexis Reed Jun 04, 1946 [id number]  CC: recurrent UTIs  HPI: I saw [name] in urology clinic today for evaluation of recurrent UTIs.  She is a very healthy 75 year old female with 5 culture documented E. coli UTIs since March 2021.  These are all documented in EPIC.  Her symptoms with UTI are typically pelvic pain and dysuria, and resolved completely with antibiotics.  She denies any urinary symptoms when she does not have a UTI.  She has very minimal stress incontinence only with heavy laughing.  She has a history of hysterectomy.  She denies any history of breast cancer.  She denies any gross hematuria.  She is unable to identify any inciting factors for her UTIs.  PVR is normal at 0 mL today.  CT in July 2020 showed no hydronephrosis, bladder abnormalities, or stones.  Urinalysis today is benign.  PMH: Past Medical History:  Diagnosis Date  . Arthritis   . Benign breast lumps    multiple lumps biopsy and remove x's 5 last being 1979 by Dr. Sharlet Salina  . COPD (chronic obstructive pulmonary disease) (HCC)    MILD-RARELY EVER HAS TO USE COMBIVENT INHALER  . Fibroids    s/p hysterectomy  . GERD (gastroesophageal reflux disease)   . Hypercholesterolemia   . Osteoporosis   . Personal history of tobacco use, presenting hazards to health 08/07/2015    Surgical History: Past Surgical History:  Procedure Laterality Date  . ABDOMINAL HYSTERECTOMY  1997   with bilateral oophorectomy  . BREAST BIOPSY Left    multiple done  . BREAST BIOPSY Right 2016   stereo- neg. FC changes  . BREAST EXCISIONAL BIOPSY Right 1975   multiple biopsies done  . BREAST SURGERY Left    lump removed. Unsure of date  . BREAST SURGERY Right    lump removed. Unsure of date  . COLONOSCOPY  2010   Dr. Vira Agar  . KNEE ARTHROSCOPY WITH LATERAL MENISECTOMY Right 05/08/2020   Procedure: RIGHT KNEE ARTHROSCOPY WITH DEBRIDEMENT AND PARTIAL LATERAL MENISCECTOMY;  Surgeon: Corky Mull,  MD;  Location: [facility];  Service: Orthopedics;  Laterality: Right;  . LAPAROSCOPIC APPENDECTOMY N/A 12/21/2018   Procedure: APPENDECTOMY LAPAROSCOPIC CONVERTED TO OPEN;  Surgeon: Benjamine Sprague, DO;  Location: [facility];  Service: General;  Laterality: N/A;  . TONSILLECTOMY      Family History: Family History  Problem Relation Age of Onset  . Hyperlipidemia Mother   . Thyroid disease Mother   . Hypertension Mother   . Colon cancer Father   . Stroke Father   . Heart disease Father        myocardial infarction  . Breast cancer Neg Hx     Social History:  reports that she quit smoking about 16 years ago. Her smoking use included cigarettes. She has a 30.00 pack-year smoking history. She has never used smokeless tobacco. She reports current alcohol use. She reports that she does not use drugs.  Physical Exam: BP 117/81 (BP Location: Left Arm, Patient Position: Sitting, Cuff Size: Large)   Pulse 77   Ht 5\' 4"  (1.626 m)   Wt 191 lb 9.6 oz (86.9 kg)   LMP 07/20/1995   BMI 32.89 kg/m    Constitutional:  Alert and oriented, No acute distress. Cardiovascular: No clubbing, cyanosis, or edema. Respiratory: Normal respiratory effort, no increased work of breathing. GI: Abdomen is soft, nontender, nondistended, no abdominal masses  Laboratory Data: Reviewed, see HPI  Pertinent Imaging: I have  personally viewed and interpreted the CT from 2020 showing no hydronephrosis or stones  Assessment & Plan:   75 year old healthy female with 5 culture documented E. coli UTIs in the last 10 months with symptoms of dysuria and pelvic pain.  We discussed the evaluation and treatment of patients with recurrent UTIs at length.  We specifically discussed the differences between asymptomatic bacteriuria and true urinary tract infection.  We discussed the AUA definition of recurrent UTI of at least 2 culture proven symptomatic acute cystitis episodes in a 78-month period, or 3 within a 1 year period.  We  discussed the importance of culture directed antibiotic treatment, and antibiotic stewardship.  First-line therapy includes nitrofurantoin(5 days), Bactrim(3 days), or fosfomycin(3 g single dose).  Possible etiologies of recurrent infection include periurethral tissue atrophy in postmenopausal woman, constipation, sexual activity, incomplete emptying, anatomic abnormalities, and even genetic predisposition.  Finally, we discussed the role of perineal hygiene, timed voiding, adequate hydration, topical vaginal estrogen, cranberry prophylaxis, and low-dose antibiotic prophylaxis.  She has an upcoming trip out of the country, and I also provided her with a prescription for Macrobid 100 mg daily twice daily in case she has a UTI overseas.  -Trial of cranberry tablets, topical estrogen cream, and daily Macrobid 50 mg daily x3 months -RTC 3 months symptom check  Nickolas Madrid, MD 08/07/2020  [place] [address] [place], [place] [address] [phone]

## 2020-08-07 NOTE — Patient Instructions (Signed)
Urinary Tract Infection, Adult  A urinary tract infection (UTI) is an infection of any part of the urinary tract. The urinary tract includes the kidneys, ureters, bladder, and urethra. These organs make, store, and get rid of urine in the body. An upper UTI affects the ureters and kidneys. A lower UTI affects the bladder and urethra. What are the causes? Most urinary tract infections are caused by bacteria in your genital area around your urethra, where urine leaves your body. These bacteria grow and cause inflammation of your urinary tract. What increases the risk? You are more likely to develop this condition if:  You have a urinary catheter that stays in place.  You are not able to control when you urinate or have a bowel movement (incontinence).  You are female and you: ? Use a spermicide or diaphragm for birth control. ? Have low estrogen levels. ? Are pregnant.  You have certain genes that increase your risk.  You are sexually active.  You take antibiotic medicines.  You have a condition that causes your flow of urine to slow down, such as: ? An enlarged prostate, if you are female. ? Blockage in your urethra. ? A kidney stone. ? A nerve condition that affects your bladder control (neurogenic bladder). ? Not getting enough to drink, or not urinating often.  You have certain medical conditions, such as: ? Diabetes. ? A weak disease-fighting system (immunesystem). ? Sickle cell disease. ? Gout. ? Spinal cord injury. What are the signs or symptoms? Symptoms of this condition include:  Needing to urinate right away (urgency).  Frequent urination. This may include small amounts of urine each time you urinate.  Pain or burning with urination.  Blood in the urine.  Urine that smells bad or unusual.  Trouble urinating.  Cloudy urine.  Vaginal discharge, if you are female.  Pain in the abdomen or the lower back. You may also have:  Vomiting or a decreased  appetite.  Confusion.  Irritability or tiredness.  A fever or chills.  Diarrhea. The first symptom in older adults may be confusion. In some cases, they may not have any symptoms until the infection has worsened. How is this diagnosed? This condition is diagnosed based on your medical history and a physical exam. You may also have other tests, including:  Urine tests.  Blood tests.  Tests for STIs (sexually transmitted infections). If you have had more than one UTI, a cystoscopy or imaging studies may be done to determine the cause of the infections. How is this treated? Treatment for this condition includes:  Antibiotic medicine.  Over-the-counter medicines to treat discomfort.  Drinking enough water to stay hydrated. If you have frequent infections or have other conditions such as a kidney stone, you may need to see a health care provider who specializes in the urinary tract (urologist). In rare cases, urinary tract infections can cause sepsis. Sepsis is a life-threatening condition that occurs when the body responds to an infection. Sepsis is treated in the hospital with IV antibiotics, fluids, and other medicines. Follow these instructions at home: Medicines  Take over-the-counter and prescription medicines only as told by your health care provider.  If you were prescribed an antibiotic medicine, take it as told by your health care provider. Do not stop using the antibiotic even if you start to feel better. General instructions  Make sure you: ? Empty your bladder often and completely. Do not hold urine for long periods of time. ? Empty your bladder after   sex. ? Wipe from front to back after urinating or having a bowel movement if you are female. Use each tissue only one time when you wipe.  Drink enough fluid to keep your urine pale yellow.  Keep all follow-up visits. This is important.   Contact a health care provider if:  Your symptoms do not get better after 1-2  days.  Your symptoms go away and then return. Get help right away if:  You have severe pain in your back or your lower abdomen.  You have a fever or chills.  You have nausea or vomiting. Summary  A urinary tract infection (UTI) is an infection of any part of the urinary tract, which includes the kidneys, ureters, bladder, and urethra.  Most urinary tract infections are caused by bacteria in your genital area.  Treatment for this condition often includes antibiotic medicines.  If you were prescribed an antibiotic medicine, take it as told by your health care provider. Do not stop using the antibiotic even if you start to feel better.  Keep all follow-up visits. This is important. This information is not intended to replace advice given to you by your health care provider. Make sure you discuss any questions you have with your health care provider. Document Revised: 02/10/2020 Document Reviewed: 02/10/2020 Elsevier Patient Education  2021 [employer]  

## 2020-08-20 ENCOUNTER — Other Ambulatory Visit: Payer: Medicare HMO

## 2020-08-22 ENCOUNTER — Ambulatory Visit: Payer: Medicare HMO | Admitting: Internal Medicine

## 2020-08-28 ENCOUNTER — Other Ambulatory Visit: Payer: Self-pay

## 2020-08-28 ENCOUNTER — Other Ambulatory Visit (INDEPENDENT_AMBULATORY_CARE_PROVIDER_SITE_OTHER): Payer: Medicare HMO

## 2020-08-28 DIAGNOSIS — E7800 Pure hypercholesterolemia, unspecified: Secondary | ICD-10-CM | POA: Diagnosis not present

## 2020-08-28 LAB — HEPATIC FUNCTION PANEL
ALT: 13 U/L (ref 0–35)
AST: 13 U/L (ref 0–37)
Albumin: 3.9 g/dL (ref 3.5–5.2)
Alkaline Phosphatase: 74 U/L (ref 39–117)
Bilirubin, Direct: 0.1 mg/dL (ref 0.0–0.3)
Total Bilirubin: 0.6 mg/dL (ref 0.2–1.2)
Total Protein: 6.4 g/dL (ref 6.0–8.3)

## 2020-08-28 LAB — BASIC METABOLIC PANEL
BUN: 15 mg/dL (ref 6–23)
CO2: 33 mEq/L — ABNORMAL HIGH (ref 19–32)
Calcium: 9.5 mg/dL (ref 8.4–10.5)
Chloride: 101 mEq/L (ref 96–112)
Creatinine, Ser: 0.73 mg/dL (ref 0.40–1.20)
GFR: 82.12 mL/min (ref >60.00)
Glucose, Bld: 89 mg/dL (ref 70–99)
Potassium: 4.3 mEq/L (ref 3.5–5.1)
Sodium: 140 mEq/L (ref 135–145)

## 2020-08-28 LAB — LIPID PANEL
Cholesterol: 239 mg/dL — ABNORMAL HIGH (ref 0–200)
HDL: 62.8 mg/dL (ref >39.00)
LDL Cholesterol: 149 mg/dL — ABNORMAL HIGH (ref 0–99)
NonHDL: 175.96
Total CHOL/HDL Ratio: 4
Triglycerides: 135 mg/dL (ref 0.0–149.0)
VLDL: 27 mg/dL (ref 0.0–40.0)

## 2020-08-30 ENCOUNTER — Other Ambulatory Visit: Payer: Self-pay

## 2020-08-30 ENCOUNTER — Ambulatory Visit: Payer: Medicare HMO | Admitting: Internal Medicine

## 2020-08-30 DIAGNOSIS — J438 Other emphysema: Secondary | ICD-10-CM

## 2020-08-30 DIAGNOSIS — K219 Gastro-esophageal reflux disease without esophagitis: Secondary | ICD-10-CM

## 2020-08-30 DIAGNOSIS — M810 Age-related osteoporosis without current pathological fracture: Secondary | ICD-10-CM

## 2020-08-30 DIAGNOSIS — E7800 Pure hypercholesterolemia, unspecified: Secondary | ICD-10-CM

## 2020-08-30 DIAGNOSIS — E041 Nontoxic single thyroid nodule: Secondary | ICD-10-CM | POA: Diagnosis not present

## 2020-08-30 DIAGNOSIS — F439 Reaction to severe stress, unspecified: Secondary | ICD-10-CM | POA: Diagnosis not present

## 2020-08-30 MED ORDER — PRAVASTATIN SODIUM 10 MG PO TABS
10.0000 mg | ORAL_TABLET | Freq: Every day | ORAL | 1 refills | Status: DC
Start: 2020-08-30 — End: 2021-01-01

## 2020-08-30 MED ORDER — OMEPRAZOLE 20 MG PO CPDR
20.0000 mg | DELAYED_RELEASE_CAPSULE | Freq: Every day | ORAL | 1 refills | Status: DC
Start: 2020-08-30 — End: 2021-08-19

## 2020-08-30 NOTE — Progress Notes (Addendum)
Patient ID: Alexis Reed, female   DOB: July 06, 1946, 75 y.o.   MRN: [id number]   Subjective:    Patient ID: Alexis Reed, female    DOB: 07/06/1946, 75 y.o.   MRN: [id number]  HPI This visit occurred during the SARS-CoV-2 public health emergency.  Safety protocols were in place, including screening questions prior to the visit, additional usage of staff PPE, and extensive cleaning of exam room while observing appropriate contact time as indicated for disinfecting solutions.  Patient here for a scheduled follow up.  Here to follow up regarding her cholesterol. Has been having problems with her right knee.  S/p injection.  Doing better.  No headache.  Previous dizziness. Saw ENT.  [place] now.  No chest pain or sob reported.  No abdominal pain reported.  Bowels stable.  Saw endocrinology - thyroid nodule.  Ultrasound - solitary nodule.  No dysphagia.  No odynophagia.  Recommended f/u ultrasound and TSH in 12 months.  Receiving prolia - 07/16/20.     Past Medical History:  Diagnosis Date  . Arthritis   . Benign breast lumps    multiple lumps biopsy and remove x's 5 last being 1979 by Dr. Sharlet Salina  . COPD (chronic obstructive pulmonary disease) (HCC)    MILD-RARELY EVER HAS TO USE COMBIVENT INHALER  . Fibroids    s/p hysterectomy  . GERD (gastroesophageal reflux disease)   . Hypercholesterolemia   . Osteoporosis   . Personal history of tobacco use, presenting hazards to health 08/07/2015  . Urinary tract infection    Past Surgical History:  Procedure Laterality Date  . ABDOMINAL HYSTERECTOMY  1997   with bilateral oophorectomy  . BREAST BIOPSY Left    multiple done  . BREAST BIOPSY Right 2016   stereo- neg. FC changes  . BREAST EXCISIONAL BIOPSY Right 1975   multiple biopsies done  . BREAST SURGERY Left    lump removed. Unsure of date  . BREAST SURGERY Right    lump removed. Unsure of date  . COLONOSCOPY  2010   Dr. Vira Agar  . KNEE ARTHROSCOPY WITH LATERAL MENISECTOMY Right  05/08/2020   Procedure: RIGHT KNEE ARTHROSCOPY WITH DEBRIDEMENT AND PARTIAL LATERAL MENISCECTOMY;  Surgeon: Corky Mull, MD;  Location: [facility] ORS;  Service: Orthopedics;  Laterality: Right;  . LAPAROSCOPIC APPENDECTOMY N/A 12/21/2018   Procedure: APPENDECTOMY LAPAROSCOPIC CONVERTED TO OPEN;  Surgeon: Benjamine Sprague, DO;  Location: [facility] ORS;  Service: General;  Laterality: N/A;  . TONSILLECTOMY     Family History  Problem Relation Age of Onset  . Hyperlipidemia Mother   . Thyroid disease Mother   . Hypertension Mother   . Colon cancer Father   . Stroke Father   . Heart disease Father        myocardial infarction  . Breast cancer Neg Hx    Social History   Socioeconomic History  . Marital status: Married    Spouse name: Not on file  . Number of children: 2  . Years of education: Not on file  . Highest education level: Not on file  Occupational History  . Not on file  Tobacco Use  . Smoking status: Former Smoker    Packs/day: 1.00    Years: 30.00    Pack years: 30.00    Types: Cigarettes    Quit date: 04/26/2004    Years since quitting: 16.3  . Smokeless tobacco: Never Used  Vaping Use  . Vaping Use: Never used  Substance and Sexual Activity  .  Alcohol use: Yes    Alcohol/week: 0.0 standard drinks    Comment: occassional wine every other day  . Drug use: No  . Sexual activity: Yes    Birth control/protection: Post-menopausal  Other Topics Concern  . Not on file  Social History Narrative   She is married, has two children. Works at [employer] Strain: Not on file  Food Insecurity: Not on file  Transportation Needs: Not on file  Physical Activity: Not on file  Stress: Not on file  Social Connections: Not on file    Outpatient Encounter Medications as of 08/30/2020  Medication Sig  . acetaminophen (TYLENOL) 500 MG tablet Take 1,000 mg by mouth every 6 (six) hours as needed for moderate  pain.  . Calcium Carb-Cholecalciferol (CALCIUM 500 +D) 500-400 MG-UNIT TABS Take 1 tablet by mouth daily.  . Cholecalciferol (VITAMIN D) 2000 UNITS tablet Take 2,000 Units by mouth daily.  Marland Kitchen denosumab (PROLIA) 60 MG/ML SOSY injection Inject 60 mg into the skin every 6 (six) months.   Marland Kitchen estradiol (ESTRACE) 0.1 MG/GM vaginal cream Estrogen Cream Instruction Discard applicator Apply pea sized amount to tip of finger to urethra before bed. Wash hands well after application. Use Monday, Wednesday and Friday  . ibuprofen (ADVIL) 800 MG tablet Take 1 tablet (800 mg total) by mouth every 8 (eight) hours as needed for mild pain or moderate pain.  . Ipratropium-Albuterol (COMBIVENT RESPIMAT) 20-100 MCG/ACT AERS respimat Inhale 1 puff into the lungs every 6 (six) hours as needed for wheezing.  . nitrofurantoin (MACRODANTIN) 50 MG capsule Take 1 capsule (50 mg total) by mouth daily.  . nitrofurantoin, macrocrystal-monohydrate, (MACROBID) 100 MG capsule Take 1 capsule (100 mg total) by mouth every 12 (twelve) hours. If UTI while overseas  . omeprazole (PRILOSEC) 20 MG capsule Take 1 capsule (20 mg total) by mouth daily.  . pravastatin (PRAVACHOL) 10 MG tablet Take 1 tablet (10 mg total) by mouth daily.  . Probiotic Product (PROBIOTIC DAILY PO) Take 1 capsule by mouth daily.  . [DISCONTINUED] omeprazole (PRILOSEC) 20 MG capsule Take 1 capsule (20 mg total) by mouth daily. (Patient taking differently: Take 20 mg by mouth every morning.)  . [DISCONTINUED] pravastatin (PRAVACHOL) 10 MG tablet Take one tablet 5 days per week. (Patient taking differently: Take 10 mg by mouth every Monday, Tuesday, Wednesday, Thursday, and Friday. AM)   No facility-administered encounter medications on file as of 08/30/2020.    Review of Systems  Constitutional: Negative for appetite change and unexpected weight change.  HENT: Negative for congestion and sinus pressure.   Respiratory: Negative for cough, chest tightness and  shortness of breath.   Cardiovascular: Negative for chest pain, palpitations and leg swelling.  Gastrointestinal: Negative for abdominal pain, diarrhea, nausea and vomiting.  Genitourinary: Negative for difficulty urinating and dysuria.  Musculoskeletal: Negative for joint swelling and myalgias.  Skin: Negative for color change and rash.  Neurological: Negative for dizziness, light-headedness and headaches.  Psychiatric/Behavioral: Negative for agitation and dysphoric mood.       Objective:    Physical Exam Constitutional:      General: She is not in acute distress.    Appearance: Normal appearance.  HENT:     Head: Normocephalic and atraumatic.     Right Ear: External ear normal.     Left Ear: External ear normal.     Nose: Nose normal.     Mouth/Throat:  Mouth: Oropharynx is clear and moist.  Eyes:     General: No scleral icterus.       Left eye: No discharge.     Pupils: Pupils are equal, round, and reactive to light.  Neck:     Thyroid: No thyromegaly.  Cardiovascular:     Rate and Rhythm: Normal rate and regular rhythm.  Pulmonary:     Effort: No respiratory distress.     Breath sounds: Normal breath sounds. No wheezing.  Abdominal:     General: Bowel sounds are normal.     Palpations: Abdomen is soft.     Tenderness: There is no abdominal tenderness.  Musculoskeletal:        General: No swelling, tenderness or edema.     Cervical back: Neck supple. No tenderness.  Lymphadenopathy:     Cervical: No cervical adenopathy.  Skin:    Findings: No erythema or rash.  Neurological:     Mental Status: She is alert.  Psychiatric:        Mood and Affect: Mood normal.        Behavior: Behavior normal.     BP 120/70   Pulse 76   Temp 97.9 F (36.6 C) (Oral)   Resp 16   Ht 5\' 4"  (1.626 m)   Wt 193 lb 3.2 oz (87.6 kg)   LMP 07/20/1995   SpO2 98%   BMI 33.16 kg/m  Wt Readings from Last 3 Encounters:  08/30/20 193 lb 3.2 oz (87.6 kg)  08/07/20 191 lb 9.6 oz  (86.9 kg)  07/18/20 184 lb (83.5 kg)     Lab Results  Component Value Date   WBC 4.2 04/11/2020   HGB 13.7 04/11/2020   HCT 41.7 04/11/2020   PLT 256.0 04/11/2020   GLUCOSE 89 08/28/2020   CHOL 239 (H) 08/28/2020   TRIG 135.0 08/28/2020   HDL 62.80 08/28/2020   LDLDIRECT 153.0 07/17/2015   LDLCALC 149 (H) 08/28/2020   ALT 13 08/28/2020   AST 13 08/28/2020   NA 140 08/28/2020   K 4.3 08/28/2020   CL 101 08/28/2020   CREATININE 0.73 08/28/2020   BUN 15 08/28/2020   CO2 33 (H) 08/28/2020   TSH 1.32 04/11/2020       Assessment & Plan:   Problem List Items Addressed This Visit    GERD (gastroesophageal reflux disease)    Upper symptoms controlled on prilosec.       Relevant Medications   omeprazole (PRILOSEC) 20 MG capsule   GOLD GRADE A COPD with Centrilobular Emphysema    Continue combivent.  Breathing stable.       Hypercholesterolemia    The 10-year ASCVD risk score Mikey Bussing DC Jr., et al., 2013) is: 10.7%   Values used to calculate the score:     Age: 11 years     Sex: Female     Is Non-Hispanic African American: No     Diabetic: No     Tobacco smoker: No     Systolic Blood Pressure: 627 mmHg     Is BP treated: No     HDL Cholesterol: 62.8 mg/dL     Total Cholesterol: 239 mg/dL  Discussed medication.  Will take cholesterol medication daily.       Relevant Medications   pravastatin (PRAVACHOL) 10 MG tablet   Other Relevant Orders   Lipid panel   Hepatic function panel   Basic metabolic panel   Osteoporosis    Continue prolia - given 07/16/20  Stress    Overall appears to be handling things well.  Follow.       Thyroid nodule    Saw endocrinology.  Recommended f/u ultrasound and TSH in 12 months.            Einar Pheasant, MD

## 2020-08-30 NOTE — Assessment & Plan Note (Addendum)
The 10-year ASCVD risk score Mikey Bussing DC Brooke Bonito., et al., 2013) is: 10.7%   Values used to calculate the score:     Age: 75 years     Sex: Female     Is Non-Hispanic African American: No     Diabetic: No     Tobacco smoker: No     Systolic Blood Pressure: 683 mmHg     Is BP treated: No     HDL Cholesterol: 62.8 mg/dL     Total Cholesterol: 239 mg/dL  Discussed medication.  Will take cholesterol medication daily.

## 2020-09-01 ENCOUNTER — Encounter: Payer: Self-pay | Admitting: Internal Medicine

## 2020-09-01 NOTE — Addendum Note (Signed)
Addended by: Alisa Graff on: 09/01/2020 08:36 PM   Modules accepted: Orders

## 2020-09-01 NOTE — Assessment & Plan Note (Signed)
Continue prolia - given 07/16/20

## 2020-09-01 NOTE — Assessment & Plan Note (Signed)
Upper symptoms controlled on prilosec.  

## 2020-09-01 NOTE — Assessment & Plan Note (Signed)
Overall appears to be handling things well.  Follow.  ?

## 2020-09-01 NOTE — Assessment & Plan Note (Signed)
Continue combivent.  Breathing stable.

## 2020-09-01 NOTE — Assessment & Plan Note (Signed)
Saw endocrinology.  Recommended f/u ultrasound and TSH in 12 months.   

## 2020-09-20 ENCOUNTER — Encounter: Payer: Self-pay | Admitting: Internal Medicine

## 2020-09-20 NOTE — Telephone Encounter (Signed)
Ok to schedule.  Thanks Caryl Pina

## 2020-09-20 NOTE — Telephone Encounter (Signed)
LM with pt daughter to call back and ask for Caryl Pina to schedule appt

## 2020-09-27 ENCOUNTER — Encounter: Payer: Self-pay | Admitting: Internal Medicine

## 2020-09-28 NOTE — Telephone Encounter (Signed)
Lm on number provide to call back and ask for Caryl Pina to schedule

## 2020-09-28 NOTE — Telephone Encounter (Signed)
Ok to schedule.  Please schedule and contact with appt.  Thanks

## 2020-11-05 ENCOUNTER — Other Ambulatory Visit: Payer: Self-pay | Admitting: *Deleted

## 2020-11-05 DIAGNOSIS — N390 Urinary tract infection, site not specified: Secondary | ICD-10-CM

## 2020-11-06 ENCOUNTER — Encounter: Payer: Self-pay | Admitting: Urology

## 2020-11-06 ENCOUNTER — Ambulatory Visit: Payer: Medicare HMO | Admitting: Urology

## 2020-11-06 ENCOUNTER — Other Ambulatory Visit: Payer: Self-pay

## 2020-11-06 VITALS — BP 139/84 | HR 75 | Ht 63.0 in | Wt 194.0 lb

## 2020-11-06 DIAGNOSIS — N390 Urinary tract infection, site not specified: Secondary | ICD-10-CM | POA: Diagnosis not present

## 2020-11-06 NOTE — Progress Notes (Signed)
   11/06/2020 1:31 PM   Alexis Reed [birth year] [id number]  Reason for visit: Follow up recurrent UTIs  HPI: 75 year old female with recurrent UTIs, including 5 culture documented E. coli UTIs since March 2021.  Symptoms with UTI are typically pelvic pain and dysuria and completely resolved with antibiotics.  CT in July 2020 showed no urologic abnormalities.  At our initial visit in January 2022 I recommended cranberry tablets, topical estrogen cream, and 3 months of daily Macrobid prophylaxis.  She did not fill the estrogen cream secondary to cost, but has been compliant with the cranberry tablets and the nitrofurantoin prophylaxis.  She has not had any UTIs since that time and has been doing very well.  She denies any complaints today.  I recommended continuing the cranberry tablets and stopping the nitrofurantoin prophylaxis.  We again reviewed the risks and benefits of topical estrogen cream, and I think this would be worth re-considering and trying to find a more cost effective generic option or use good Rx if she has recurrent infections after stopping the nitrofurantoin prophylaxis  RTC 1 year, sooner if UTIs or problems.  Start topical estrogen cream at that time if recurrent infections   Billey Co, MD  [facility] [address], [place],  [address] [phone]

## 2020-11-14 ENCOUNTER — Encounter: Payer: Self-pay | Admitting: Internal Medicine

## 2020-11-14 ENCOUNTER — Other Ambulatory Visit: Payer: Self-pay

## 2020-11-14 ENCOUNTER — Ambulatory Visit: Payer: Medicare HMO | Admitting: Internal Medicine

## 2020-11-14 VITALS — BP 126/80 | HR 84 | Temp 96.9°F | Ht 63.0 in | Wt 195.8 lb

## 2020-11-14 DIAGNOSIS — J449 Chronic obstructive pulmonary disease, unspecified: Secondary | ICD-10-CM | POA: Diagnosis not present

## 2020-11-14 DIAGNOSIS — R918 Other nonspecific abnormal finding of lung field: Secondary | ICD-10-CM

## 2020-11-14 MED ORDER — ADVAIR HFA 230-21 MCG/ACT IN AERO: 2.0000 | INHALATION_SPRAY | Freq: Two times a day (BID) | RESPIRATORY_TRACT | 12 refills | Status: DC

## 2020-11-14 MED ORDER — COMBIVENT RESPIMAT 20-100 MCG/ACT IN AERS: 1.0000 | INHALATION_SPRAY | Freq: Four times a day (QID) | RESPIRATORY_TRACT | 1 refills | Status: DC | PRN

## 2020-11-14 NOTE — Patient Instructions (Addendum)
ABNORMAL CT CHEST IN THE PAST-OBTAIN CT CHEST  START ADVAIR HFA 230 2 puffs in AM and 2 Puffs in PM Make sure to rinse mouth out after every use  Check PFT's 6MWT Check ONO  COMBI-VENT INHALER AS NEEDED  Recommend weight Loss

## 2020-11-14 NOTE — Progress Notes (Signed)
Date:  11/14/2020   ID:  Alexis Reed, DOB 02-26-1946, MRN [id number]  Patient Location:  [place] [id number]   Provider location:   [place]  PCP:  Einar Pheasant, MD   Chief Complaint:   Follow-up COPD Follow-up abnormal CT chest Follow-up exertional shortness of breath   History of Present Illness:    Patient with a history of nodular densities subcentimeter in the past associated with increased work of breathing and shortness of breath with wheezing  I will recommend CT chest to assess for interval changes  Patient has been in lung cancer screening in the past for the last 5 years Patient had previous evidence of CT scan showing tree-in-bud pattern nodular opacity in the right upper lobe  At this time patient does have some increased work of breathing and wheezing with some shortness of breath I have advised to start Advair HFA therapy at this time Continue Combivent inhaler therapy as needed Patient has increased use of Combivent inhaler therapy in the last several months    Quit tobacco abuse 17 years ago Smoked 1 pack a day for 30 years Previous history of COPD exacerbation  No exacerbation at this time No evidence of heart failure at this time No evidence or signs of infection at this time No respiratory distress No fevers, chills, nausea, vomiting, diarrhea No evidence of lower extremity edema No evidence hemoptysis  Patient with 185 pounds in 2020 Current weight is 195 pounds       CT of the chest April 21, 2019 reviewed in detail with patient Independently reviewed Shows right upper lobe nodular opacities possible tree-in-bud pattern        Past Medical History:  Diagnosis Date  . Arthritis   . Benign breast lumps    multiple lumps biopsy and remove x's 5 last being 1979 by Dr. Sharlet Salina  . COPD (chronic obstructive pulmonary disease) (HCC)    MILD-RARELY EVER HAS TO USE COMBIVENT  INHALER  . Fibroids    s/p hysterectomy  . GERD (gastroesophageal reflux disease)   . Hypercholesterolemia   . Osteoporosis   . Personal history of tobacco use, presenting hazards to health 08/07/2015  . Urinary tract infection    Past Surgical History:  Procedure Laterality Date  . ABDOMINAL HYSTERECTOMY  1997   with bilateral oophorectomy  . BREAST BIOPSY Left    multiple done  . BREAST BIOPSY Right 2016   stereo- neg. FC changes  . BREAST EXCISIONAL BIOPSY Right 1975   multiple biopsies done  . BREAST SURGERY Left    lump removed. Unsure of date  . BREAST SURGERY Right    lump removed. Unsure of date  . COLONOSCOPY  2010   Dr. Vira Agar  . KNEE ARTHROSCOPY WITH LATERAL MENISECTOMY Right 05/08/2020   Procedure: RIGHT KNEE ARTHROSCOPY WITH DEBRIDEMENT AND PARTIAL LATERAL MENISCECTOMY;  Surgeon: Corky Mull, MD;  Location: [facility] ORS;  Service: Orthopedics;  Laterality: Right;  . LAPAROSCOPIC APPENDECTOMY N/A 12/21/2018   Procedure: APPENDECTOMY LAPAROSCOPIC CONVERTED TO OPEN;  Surgeon: Benjamine Sprague, DO;  Location: [facility] ORS;  Service: General;  Laterality: N/A;  . TONSILLECTOMY       Current Meds  Medication Sig  . acetaminophen (TYLENOL) 500 MG tablet Take 1,000 mg by mouth every 6 (six) hours as needed for moderate pain.  . Calcium Carb-Cholecalciferol (CALCIUM 500 +D) 500-400 MG-UNIT TABS Take 1 tablet by mouth daily.  . Cholecalciferol (VITAMIN D) 2000 UNITS tablet  Take 2,000 Units by mouth daily.  Marland Kitchen denosumab (PROLIA) 60 MG/ML SOSY injection Inject 60 mg into the skin every 6 (six) months.   Marland Kitchen ibuprofen (ADVIL) 800 MG tablet Take 1 tablet (800 mg total) by mouth every 8 (eight) hours as needed for mild pain or moderate pain.  . Ipratropium-Albuterol (COMBIVENT RESPIMAT) 20-100 MCG/ACT AERS respimat Inhale 1 puff into the lungs every 6 (six) hours as needed for wheezing.  . nitrofurantoin (MACRODANTIN) 50 MG capsule Take 1 capsule (50 mg total) by mouth daily.  Marland Kitchen omeprazole  (PRILOSEC) 20 MG capsule Take 1 capsule (20 mg total) by mouth daily.  . pravastatin (PRAVACHOL) 10 MG tablet Take 1 tablet (10 mg total) by mouth daily.  . Probiotic Product (PROBIOTIC DAILY PO) Take 1 capsule by mouth daily.     Allergies:   Boniva [ibandronic acid], Codeine, Fosamax [alendronate sodium], Lipitor [atorvastatin], Meloxicam, Penicillins, and Steri-strip compound benzoin [benzoin compound]   Social History   Tobacco Use  . Smoking status: Former Smoker    Packs/day: 1.00    Years: 30.00    Pack years: 30.00    Types: Cigarettes    Quit date: 04/26/2004    Years since quitting: 16.5  . Smokeless tobacco: Never Used  Vaping Use  . Vaping Use: Never used  Substance Use Topics  . Alcohol use: Yes    Alcohol/week: 0.0 standard drinks    Comment: occassional wine every other day  . Drug use: No     Family Hx: The patient's family history includes Colon cancer in her father; Heart disease in her father; Hyperlipidemia in her mother; Hypertension in her mother; Stroke in her father; Thyroid disease in her mother. There is no history of Breast cancer.    Review of Systems:  Gen:  Denies  fever, sweats, chills weight loss  HEENT: Denies blurred vision, double vision, ear pain, eye pain, hearing loss, nose bleeds, sore throat Cardiac:  No dizziness, chest pain or heaviness, chest tightness,edema, No JVD Resp:+ cough, +sputum production, +shortness of breath,+wheezing, -hemoptysis,  Gi: Denies swallowing difficulty, stomach pain, nausea or vomiting, diarrhea, constipation, bowel incontinence Gu:  Denies bladder incontinence, burning urine Ext:   Denies Joint pain, stiffness or swelling Skin: Denies  skin rash, easy bruising or bleeding or hives Endoc:  Denies polyuria, polydipsia , polyphagia or weight change Psych:   Denies depression, insomnia or hallucinations  Other:  All other systems negative    Vital Signs:  BP 126/80 (BP Location: Left Arm, Patient  Position: Sitting, Cuff Size: Normal)   Pulse 84   Temp (!) 96.9 F (36.1 C) (Temporal)   Ht 5\' 3"  (1.6 m)   Wt 195 lb 12.8 oz (88.8 kg)   LMP 07/20/1995   BMI 34.68 kg/m      Physical Examination:   General Appearance: No distress  Neuro:without focal findings,  speech normal,  HEENT: PERRLA, EOM intact.   Pulmonary: normal breath sounds, No wheezing.  CardiovascularNormal S1,S2.  No m/r/g.   Abdomen: Benign, Soft, non-tender. Renal:  No costovertebral tenderness  GU:  Not performed at this time. Endoc: No evident thyromegaly Skin:   warm, no rashes, no ecchymosis  Extremities: normal, no cyanosis, clubbing. PSYCHIATRIC: Mood, affect within normal limits.   ALL OTHER ROS ARE NEGATIVE         Outpatient Encounter Medications as of 11/14/2020  Medication Sig  . acetaminophen (TYLENOL) 500 MG tablet Take 1,000 mg by mouth every 6 (six) hours as needed for  moderate pain.  . Calcium Carb-Cholecalciferol (CALCIUM 500 +D) 500-400 MG-UNIT TABS Take 1 tablet by mouth daily.  . Cholecalciferol (VITAMIN D) 2000 UNITS tablet Take 2,000 Units by mouth daily.  Marland Kitchen denosumab (PROLIA) 60 MG/ML SOSY injection Inject 60 mg into the skin every 6 (six) months.   Marland Kitchen ibuprofen (ADVIL) 800 MG tablet Take 1 tablet (800 mg total) by mouth every 8 (eight) hours as needed for mild pain or moderate pain.  . Ipratropium-Albuterol (COMBIVENT RESPIMAT) 20-100 MCG/ACT AERS respimat Inhale 1 puff into the lungs every 6 (six) hours as needed for wheezing.  . nitrofurantoin (MACRODANTIN) 50 MG capsule Take 1 capsule (50 mg total) by mouth daily.  Marland Kitchen omeprazole (PRILOSEC) 20 MG capsule Take 1 capsule (20 mg total) by mouth daily.  . pravastatin (PRAVACHOL) 10 MG tablet Take 1 tablet (10 mg total) by mouth daily.  . Probiotic Product (PROBIOTIC DAILY PO) Take 1 capsule by mouth daily.   No facility-administered encounter medications on file as of 11/14/2020.   Medications reviewed with  patient     ASSESSMENT & PLAN:    75 year old pleasant white female seen today for longstanding smoking history with a diagnosis of COPD based on CT scan findings also with a history of bronchiectasis with abnormal CT chest findings of tree-in-bud nodular opacity in the right upper lobe, patient with progressive shortness of breath and exertional dyspnea over the last several months  Symptoms have been worsening and therefore needs further evaluation  Based on previous history of right upper lobe nodular opacifications bronchiectasis and previous smoking history patient will need CT of the chest for interval assessment of the lungs  Regarding COPD patient will need to be started on inhaled corticosteroid therapy along with long-acting beta agonist We will start Advair HFA Patient advised to rinse mouth after every use Continue Combivent inhaler as needed No indication for antibiotics or prednisone at this time  Bronchiectasis We will obtain CT of the chest to assess Continue bronchodilator therapy  Deconditioned state Overweight Has gained 10 pounds over the last 2 years -Recommend increased daily activity and exercise Recommend Weight Loss  Dyspnea on exertion and shortness of breath Patient will need 6-minute walk test to assess for exertional hypoxia She will need overnight pulse oximetry to assess for nocturnal hypoxia  COVID-19 EDUCATION: The signs and symptoms of COVID-19 were discussed with the patient. Importance of hand Hygiene and Social Distancing   MEDICATION ADJUSTMENTS/LABS AND TESTS ORDERED: ABNORMAL CT CHEST IN THE PAST-OBTAIN CT CHEST  START ADVAIR HFA 230 2 puffs in AM and 2 Puffs in PM Make sure to rinse mouth out after every use  Check PFT's 6MWT Check ONO  COMBI-VENT INHALER AS NEEDED  Recommend weight Loss    CURRENT MEDICATIONS REVIEWED AT LENGTH WITH PATIENT TODAY   Patient satisfied with Plan of action and management. All questions  answered  Follow up 6 months  Total time spent 38 mins  Corrin Parker, M.D.  [facility]  Medical Director [place] Director [facility] Cardio-Pulmonary Department

## 2020-11-30 ENCOUNTER — Telehealth: Payer: Self-pay

## 2020-11-30 NOTE — Telephone Encounter (Signed)
Spoke to pt about upcoming Covid test on 5/24 pt had a clear understanding.

## 2020-12-03 ENCOUNTER — Ambulatory Visit
Admission: RE | Admit: 2020-12-03 | Discharge: 2020-12-03 | Disposition: A | Discharge status: Home / Self Care | Payer: Medicare HMO | Source: Ambulatory Visit | Attending: Internal Medicine | Admitting: Internal Medicine

## 2020-12-03 ENCOUNTER — Other Ambulatory Visit: Payer: Self-pay

## 2020-12-03 DIAGNOSIS — R918 Other nonspecific abnormal finding of lung field: Secondary | ICD-10-CM | POA: Diagnosis not present

## 2020-12-03 IMAGING — CT CT CHEST W/O CM
2 of 4 series · 15 of 36 positions shown, 18 images · non-contrast
Comparison: Chest CT [DATE].

CLINICAL DATA: 74-year-old female with history of pulmonary nodule.
Difficulty breathing.

EXAM:
CT CHEST WITHOUT CONTRAST
TECHNIQUE: Multidetector CT imaging of the chest was performed following the
standard protocol without IV contrast.

[Series 2: chest 2.00 · axial · 0.66mm/px · z∈[-1190,-902]mm · 12 of 172 slices shown, 15 images]
[im 14/172  mediastinal]
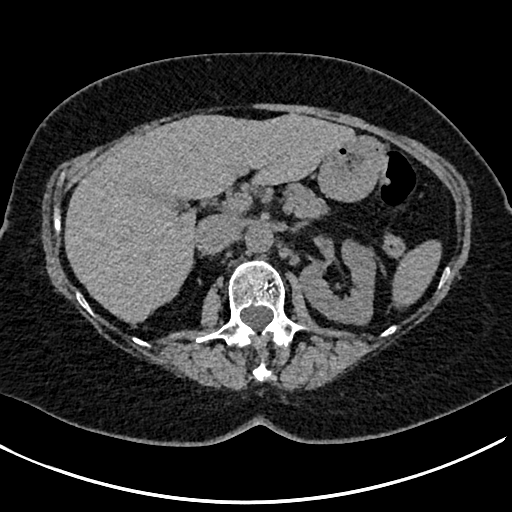
[im 14/172  lung]
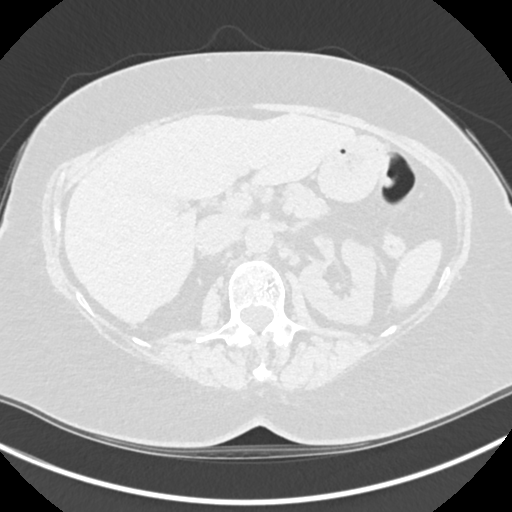
[im 27/172  lung]
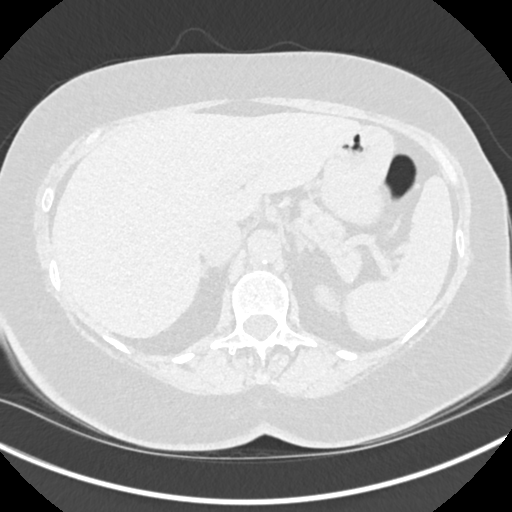
[im 40/172  lung]
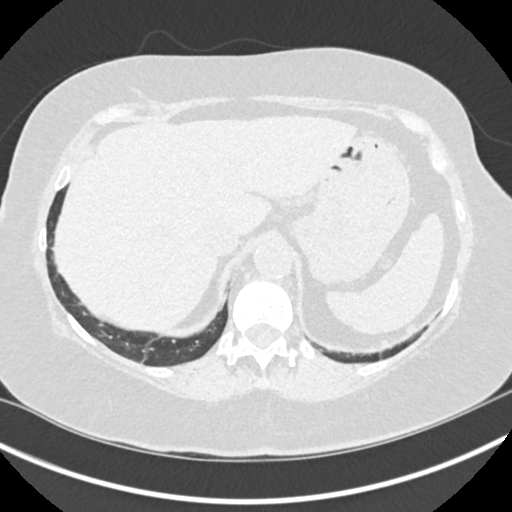
[im 53/172  lung]
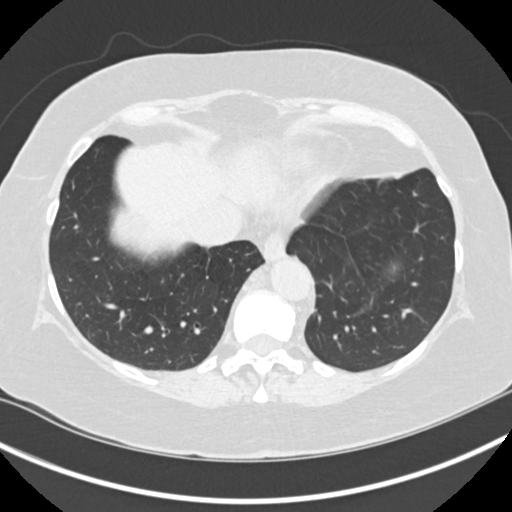
[im 66/172  mediastinal]
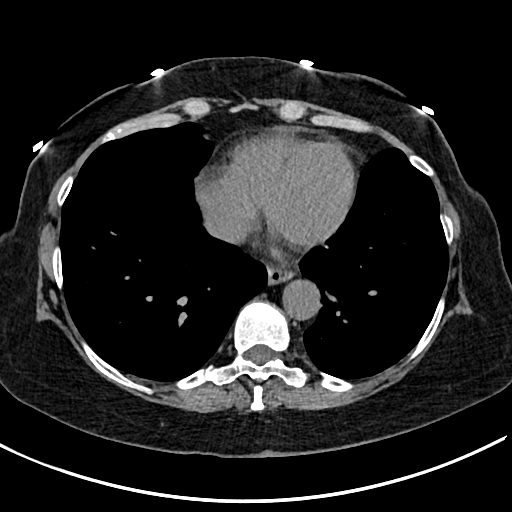
[im 66/172  lung]
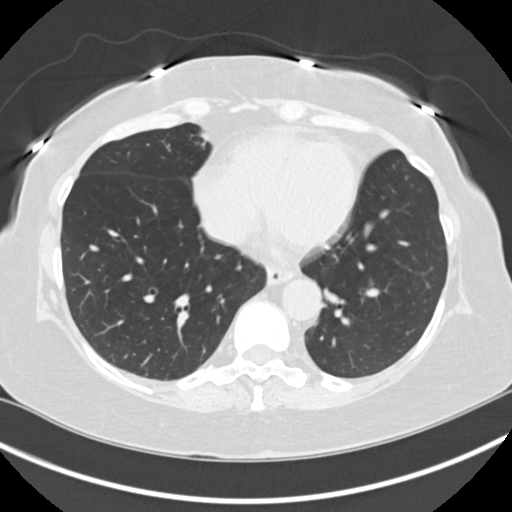
[im 79/172  lung]
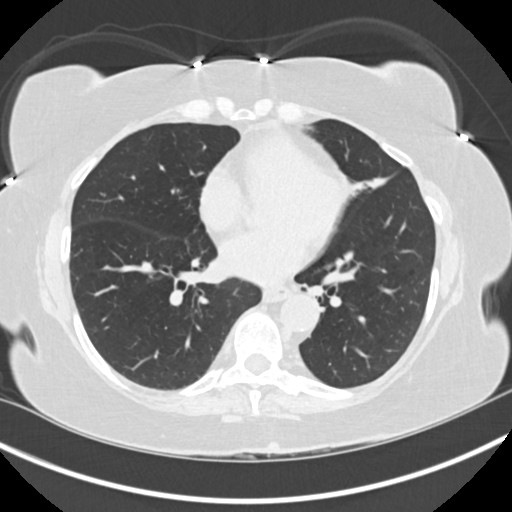
[im 93/172  lung]
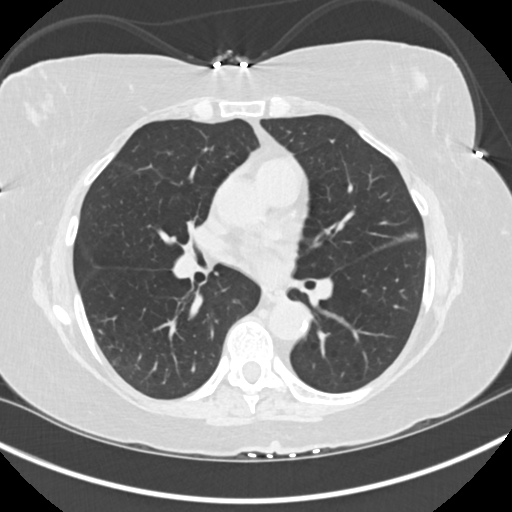
[im 106/172  lung]
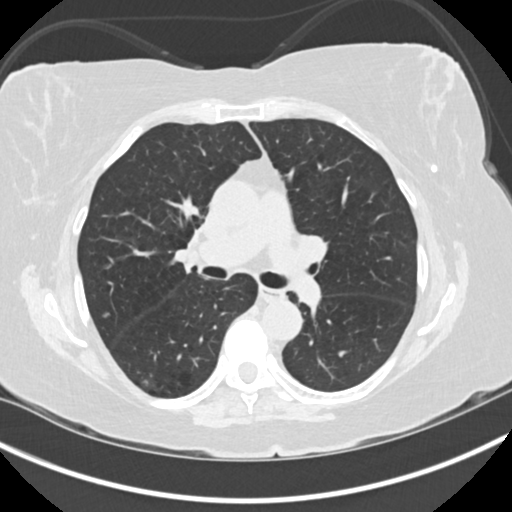
[im 119/172  mediastinal]
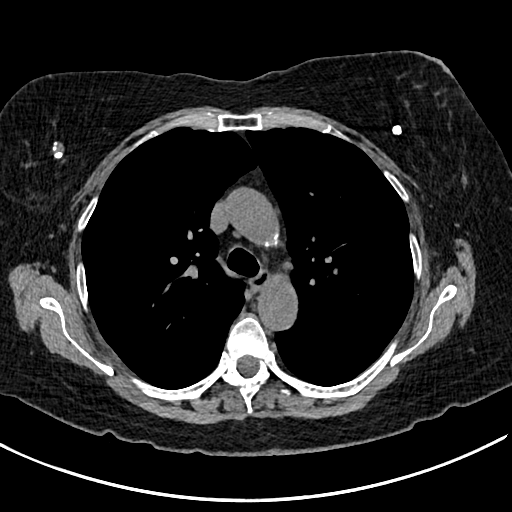
[im 119/172  lung]
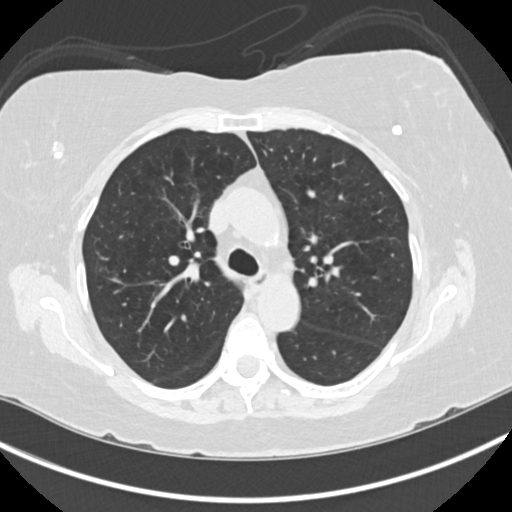
[im 132/172  lung]
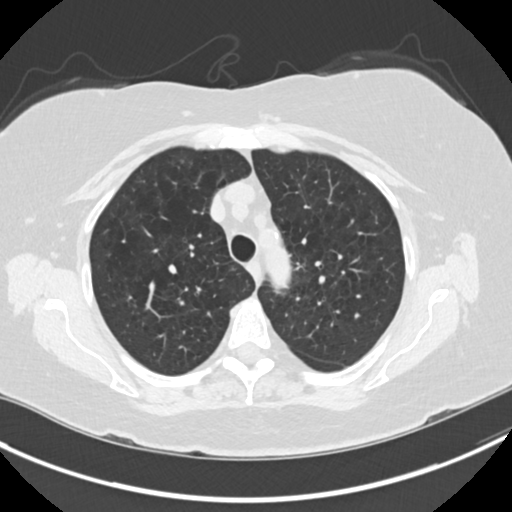
[im 145/172  lung]
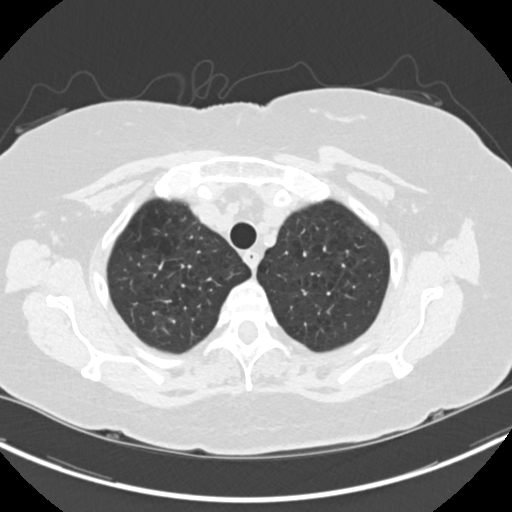
[im 158/172  lung]
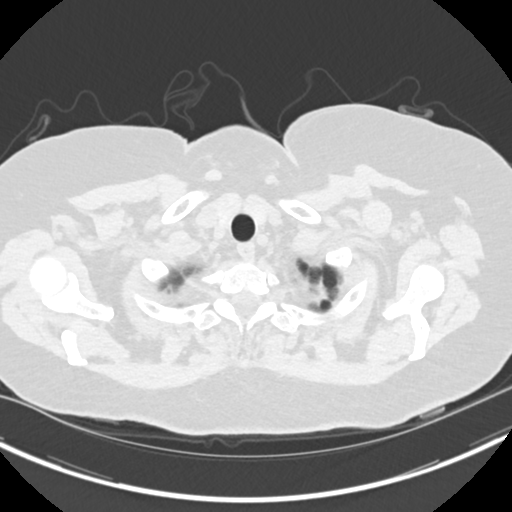

[Series 5: coronals chest 2.00 cor · coronal · 0.66mm/px · 3 of 136 slices shown]
[im 28/136  lung]
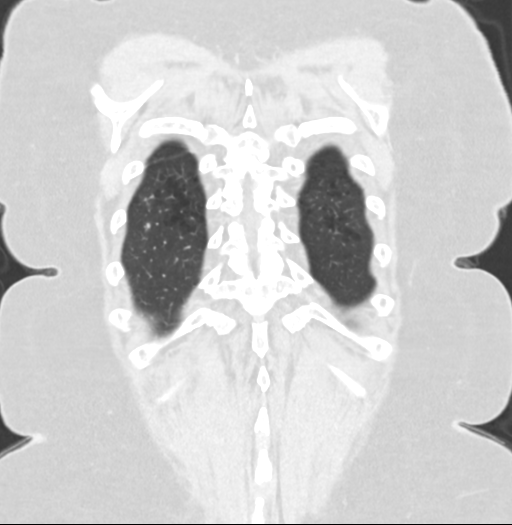
[im 55/136  lung]
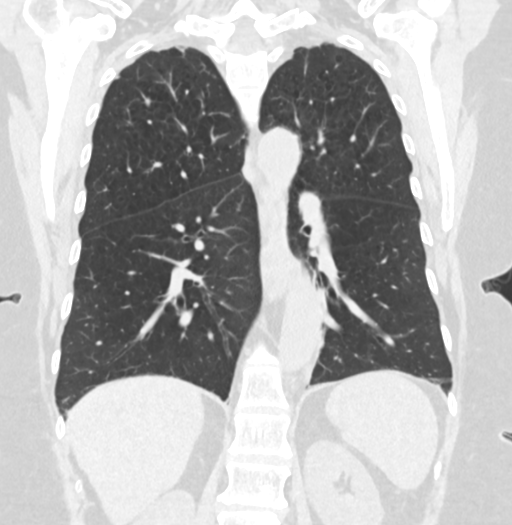
[im 82/136  lung]
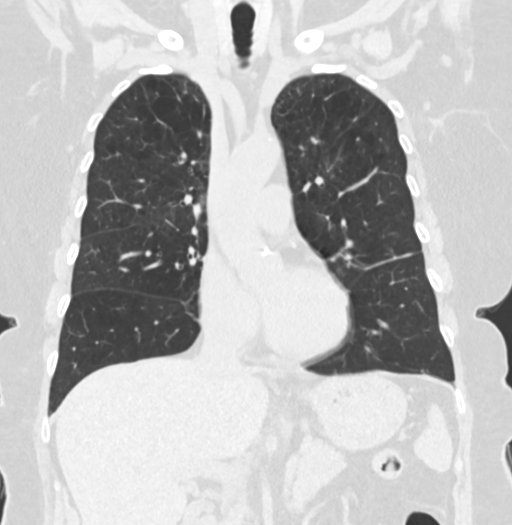

[15 of 36 positions shown; findings below may reference images not displayed]

FINDINGS: Cardiovascular: Heart size is normal. There is no significant
pericardial fluid, thickening or pericardial calcification. There is
aortic atherosclerosis, as well as atherosclerosis of the great
vessels of the mediastinum and the coronary arteries, including
calcified atherosclerotic plaque in the left anterior descending
coronary artery.

Mediastinum/Nodes: No pathologically enlarged mediastinal or hilar
lymph nodes. Please note that accurate exclusion of hilar adenopathy
is limited on noncontrast CT scans. Esophagus is unremarkable in
appearance. No axillary lymphadenopathy.

Lungs/Pleura: The previously noted 1-3 mm pulmonary nodules are
stable throughout the lungs bilaterally, considered definitively
benign. Some new small nodules are also noted on today's study. The
largest solid nodule is in the right lower lobe (axial image 78 of
series 3) measuring only 3 mm. In addition, in the right upper lobe
near the apex (axial image 24 of series 3) there is a mixed
ground-glass attenuation and solid nodule measuring 1.6 x 1.1 cm
with central solid component measuring 3 mm (axial image 26 of
series 3). No other larger more suspicious appearing pulmonary
nodules or masses are noted. Chronic areas of scarring in the
inferior segment of the lingula. Diffuse bronchial wall thickening
with moderate centrilobular and paraseptal emphysema.

Upper Abdomen: Aortic atherosclerosis.

Musculoskeletal: There are no aggressive appearing lytic or blastic
lesions noted in the visualized portions of the skeleton.
IMPRESSION: 1. While the previously noted pulmonary nodules are stable and
considered benign, there are several new nodules, largest of which
is a mixed solid and sub solid lesion in the right upper lobe near
the apex measuring 1.6 x 1.1 cm with central solid component
measuring only 3 mm. Follow-up non-contrast CT recommended at 3-6
months to confirm persistence. If unchanged, and solid component
remains <6 mm, annual CT is recommended until 5 years of stability
has been established. If persistent these nodules should be
considered highly suspicious if the solid component of the nodule is
6 mm or greater in size and enlarging. This recommendation follows
the consensus statement: Guidelines for Management of Incidental
Pulmonary Nodules Detected on CT Images: From the [HOSPITAL]
2. Diffuse bronchial wall thickening with moderate centrilobular and
paraseptal emphysema; imaging findings suggestive of underlying
COPD.
3. Aortic atherosclerosis, in addition to left anterior descending
coronary artery disease. Assessment for potential risk factor
modification, dietary therapy or pharmacologic therapy may be
warranted, if clinically indicated.

Aortic Atherosclerosis ([MX]-[MX]) and Emphysema ([MX]-[MX]).

## 2020-12-04 ENCOUNTER — Ambulatory Visit (INDEPENDENT_AMBULATORY_CARE_PROVIDER_SITE_OTHER): Payer: Medicare HMO

## 2020-12-04 ENCOUNTER — Other Ambulatory Visit
Admission: RE | Admit: 2020-12-04 | Discharge: 2020-12-04 | Disposition: A | Discharge status: Home / Self Care | Payer: Medicare HMO | Source: Ambulatory Visit | Attending: Internal Medicine | Admitting: Internal Medicine

## 2020-12-04 DIAGNOSIS — J449 Chronic obstructive pulmonary disease, unspecified: Secondary | ICD-10-CM

## 2020-12-04 DIAGNOSIS — R0600 Dyspnea, unspecified: Secondary | ICD-10-CM

## 2020-12-04 DIAGNOSIS — R0609 Other forms of dyspnea: Secondary | ICD-10-CM

## 2020-12-04 DIAGNOSIS — Z20822 Contact with and (suspected) exposure to covid-19: Secondary | ICD-10-CM | POA: Diagnosis not present

## 2020-12-04 DIAGNOSIS — Z01812 Encounter for preprocedural laboratory examination: Secondary | ICD-10-CM | POA: Insufficient documentation

## 2020-12-04 LAB — SARS CORONAVIRUS 2 (TAT 6-24 HRS): SARS Coronavirus 2: NEGATIVE

## 2020-12-05 ENCOUNTER — Ambulatory Visit: Payer: Medicare HMO

## 2020-12-05 ENCOUNTER — Telehealth: Payer: Self-pay | Admitting: Internal Medicine

## 2020-12-05 NOTE — Telephone Encounter (Signed)
I have spoke with Pamala Hurry and she has CXL PFT for today.

## 2020-12-05 NOTE — Telephone Encounter (Signed)
Routing to [place].

## 2020-12-07 ENCOUNTER — Telehealth: Payer: Self-pay

## 2020-12-07 NOTE — Telephone Encounter (Signed)
Per Dr Mortimer Fries, pt needs 2L of oxygen at night.   After speaking with the pt she states she does not want oxygen at this time.

## 2020-12-12 NOTE — Telephone Encounter (Signed)
Ok. Thank you.

## 2020-12-17 NOTE — Telephone Encounter (Signed)
Patient is out of town all this week and will call back to reschedule her PFT next week.  I told we can if there are any appts left

## 2020-12-26 ENCOUNTER — Other Ambulatory Visit: Payer: Self-pay

## 2020-12-26 ENCOUNTER — Other Ambulatory Visit (INDEPENDENT_AMBULATORY_CARE_PROVIDER_SITE_OTHER): Payer: Medicare HMO

## 2020-12-26 ENCOUNTER — Other Ambulatory Visit: Payer: Medicare HMO

## 2020-12-26 DIAGNOSIS — E7800 Pure hypercholesterolemia, unspecified: Secondary | ICD-10-CM

## 2020-12-26 LAB — LIPID PANEL
Cholesterol: 211 mg/dL — ABNORMAL HIGH (ref 0–200)
HDL: 49.4 mg/dL (ref >39.00)
LDL Cholesterol: 133 mg/dL — ABNORMAL HIGH (ref 0–99)
NonHDL: 161.67
Total CHOL/HDL Ratio: 4
Triglycerides: 142 mg/dL (ref 0.0–149.0)
VLDL: 28.4 mg/dL (ref 0.0–40.0)

## 2020-12-26 LAB — HEPATIC FUNCTION PANEL
ALT: 13 U/L (ref 0–35)
AST: 13 U/L (ref 0–37)
Albumin: 4.2 g/dL (ref 3.5–5.2)
Alkaline Phosphatase: 73 U/L (ref 39–117)
Bilirubin, Direct: 0.1 mg/dL (ref 0.0–0.3)
Total Bilirubin: 0.6 mg/dL (ref 0.2–1.2)
Total Protein: 6.5 g/dL (ref 6.0–8.3)

## 2020-12-26 LAB — BASIC METABOLIC PANEL
BUN: 14 mg/dL (ref 6–23)
CO2: 29 mEq/L (ref 19–32)
Calcium: 9.6 mg/dL (ref 8.4–10.5)
Chloride: 102 mEq/L (ref 96–112)
Creatinine, Ser: 0.63 mg/dL (ref 0.40–1.20)
GFR: 88.38 mL/min (ref >60.00)
Glucose, Bld: 89 mg/dL (ref 70–99)
Potassium: 4.1 mEq/L (ref 3.5–5.1)
Sodium: 141 mEq/L (ref 135–145)

## 2021-01-01 ENCOUNTER — Other Ambulatory Visit: Payer: Self-pay

## 2021-01-01 ENCOUNTER — Encounter: Payer: Self-pay | Admitting: Internal Medicine

## 2021-01-01 ENCOUNTER — Ambulatory Visit (INDEPENDENT_AMBULATORY_CARE_PROVIDER_SITE_OTHER): Payer: Medicare HMO | Admitting: Internal Medicine

## 2021-01-01 VITALS — BP 128/74 | HR 78 | Temp 97.9°F | Resp 16 | Ht 63.0 in | Wt 194.0 lb

## 2021-01-01 DIAGNOSIS — Z86010 Personal history of colonic polyps: Secondary | ICD-10-CM

## 2021-01-01 DIAGNOSIS — J438 Other emphysema: Secondary | ICD-10-CM

## 2021-01-01 DIAGNOSIS — E041 Nontoxic single thyroid nodule: Secondary | ICD-10-CM | POA: Diagnosis not present

## 2021-01-01 DIAGNOSIS — K219 Gastro-esophageal reflux disease without esophagitis: Secondary | ICD-10-CM

## 2021-01-01 DIAGNOSIS — F439 Reaction to severe stress, unspecified: Secondary | ICD-10-CM

## 2021-01-01 DIAGNOSIS — M810 Age-related osteoporosis without current pathological fracture: Secondary | ICD-10-CM | POA: Diagnosis not present

## 2021-01-01 DIAGNOSIS — E7800 Pure hypercholesterolemia, unspecified: Secondary | ICD-10-CM

## 2021-01-01 DIAGNOSIS — Z1231 Encounter for screening mammogram for malignant neoplasm of breast: Secondary | ICD-10-CM

## 2021-01-01 MED ORDER — PRAVASTATIN SODIUM 20 MG PO TABS: 20.0000 mg | ORAL_TABLET | Freq: Every day | ORAL | 1 refills | Status: DC

## 2021-01-01 NOTE — Telephone Encounter (Signed)
I spoke with Alexis Reed today she and her husband both tested positive for Covid with home covid test 12/12/2020.  Her PFT has been rescheduled for 02/06/21 @ 1:00pm PFT instructions mailed with new PFT appt

## 2021-01-01 NOTE — Patient Instructions (Signed)
Increase pravastatin to 20mg  per day.

## 2021-01-01 NOTE — Progress Notes (Signed)
Patient ID: Alexis Reed, female   DOB: [birth year], 75 y.o.   MRN: [id number]   Subjective:    Patient ID: Alexis Reed, female    DOB: 04-21-1946, 75 y.o.   MRN: [id number]  HPI  This visit occurred during the SARS-CoV-2 public health emergency.  Safety protocols were in place, including screening questions prior to the visit, additional usage of staff PPE, and extensive cleaning of exam room while observing appropriate contact time as indicated for disinfecting solutions.   Patient here for a scheduled follow up.  Here to follow up regarding her COPD, recurring UTIs and cholesterol.  She was covid positive 12/12/20.  No residual problems from covid.  Saw Dr Mortimer Fries 11/14/20.  Regarding her COPD, they recommended her starting Advair.  She continues on Combivent.  Also recommended a chest CT to follow-up regarding her bronchiectasis.  Recommended a 6-minute walk to assess for exertional hypoxia and also an overnight pulse oximetry to assess for nocturnal hypoxia.  Recommended 2 L oxygen at night.  She declined. Planning for pulmonary function test in the future. Saw Dr Diamantina Providence for reoccurring urinary tract infections.  She took [place] for 3 months (for prophylaxis).  She has been taking the cranberry tablets.  Has done well since stopping the prophylactic antibiotics.  No urinary symptoms.  She tries to stay active.  No chest pain reported.  Breathing stable.  No increased cough or congestion.  No increased abdominal pain reported.  No bowel issues reported.  No sick contacts.  No fever.  No nausea or vomiting.  She did twist her right knee.  Can walk.  Right knee does limit her activity.  Better.  Desires no further invention at this time.   Past Medical History:  Diagnosis Date   Arthritis    Benign breast lumps    multiple lumps biopsy and remove x's 5 last being 1979 by Dr. Sharlet Salina   COPD (chronic obstructive pulmonary disease) ([place])    MILD-RARELY EVER HAS TO USE COMBIVENT INHALER    Fibroids    s/p hysterectomy   GERD (gastroesophageal reflux disease)    Hypercholesterolemia    Osteoporosis    Personal history of tobacco use, presenting hazards to health 08/07/2015   Urinary tract infection    Past Surgical History:  Procedure Laterality Date   ABDOMINAL HYSTERECTOMY  1997   with bilateral oophorectomy   BREAST BIOPSY Left    multiple done   BREAST BIOPSY Right 2016   stereo- neg. FC changes   BREAST EXCISIONAL BIOPSY Right 1975   multiple biopsies done   BREAST SURGERY Left    lump removed. Unsure of date   BREAST SURGERY Right    lump removed. Unsure of date   COLONOSCOPY  2010   Dr. Vira Agar   KNEE ARTHROSCOPY WITH LATERAL MENISECTOMY Right 05/08/2020   Procedure: RIGHT KNEE ARTHROSCOPY WITH DEBRIDEMENT AND PARTIAL LATERAL MENISCECTOMY;  Surgeon: Corky Mull, MD;  Location: [facility] ORS;  Service: Orthopedics;  Laterality: Right;   LAPAROSCOPIC APPENDECTOMY N/A 12/21/2018   Procedure: APPENDECTOMY LAPAROSCOPIC CONVERTED TO OPEN;  Surgeon: Benjamine Sprague, DO;  Location: [facility] ORS;  Service: General;  Laterality: N/A;   TONSILLECTOMY     Family History  Problem Relation Age of Onset   Hyperlipidemia Mother    Thyroid disease Mother    Hypertension Mother    Colon cancer Father    Stroke Father    Heart disease Father        myocardial infarction  Breast cancer Neg Hx    Social History   Socioeconomic History   Marital status: Married    Spouse name: Not on file   Number of children: 2   Years of education: Not on file   Highest education level: Not on file  Occupational History   Not on file  Tobacco Use   Smoking status: Former    Packs/day: 1.00    Years: 30.00    Pack years: 30.00    Types: Cigarettes    Quit date: 04/26/2004    Years since quitting: 16.7   Smokeless tobacco: Never  Vaping Use   Vaping Use: Never used  Substance and Sexual Activity   Alcohol use: Yes    Alcohol/week: 0.0 standard drinks    Comment: occassional wine  every other day   Drug use: No   Sexual activity: Yes    Birth control/protection: Post-menopausal  Other Topics Concern   Not on file  Social History Narrative   She is married, has two children. Works at [employer] Strain: Not on file  Food Insecurity: Not on file  Transportation Needs: Not on file  Physical Activity: Not on file  Stress: Not on file  Social Connections: Not on file    Outpatient Encounter Medications as of 01/01/2021  Medication Sig   pravastatin (PRAVACHOL) 20 MG tablet Take 1 tablet (20 mg total) by mouth daily.   acetaminophen (TYLENOL) 500 MG tablet Take 1,000 mg by mouth every 6 (six) hours as needed for moderate pain.   Calcium Carb-Cholecalciferol (CALCIUM 500 +D) 500-400 MG-UNIT TABS Take 1 tablet by mouth daily.   Cholecalciferol (VITAMIN D) 2000 UNITS tablet Take 2,000 Units by mouth daily.   denosumab (PROLIA) 60 MG/ML SOSY injection Inject 60 mg into the skin every 6 (six) months.    fluticasone-salmeterol (ADVAIR HFA) 230-21 MCG/ACT inhaler Inhale 2 puffs into the lungs 2 (two) times daily.   ibuprofen (ADVIL) 800 MG tablet Take 1 tablet (800 mg total) by mouth every 8 (eight) hours as needed for mild pain or moderate pain.   Ipratropium-Albuterol (COMBIVENT RESPIMAT) 20-100 MCG/ACT AERS respimat Inhale 1 puff into the lungs every 6 (six) hours as needed for wheezing.   nitrofurantoin (MACRODANTIN) 50 MG capsule Take 1 capsule (50 mg total) by mouth daily.   omeprazole (PRILOSEC) 20 MG capsule Take 1 capsule (20 mg total) by mouth daily.   Probiotic Product (PROBIOTIC DAILY PO) Take 1 capsule by mouth daily.   [DISCONTINUED] pravastatin (PRAVACHOL) 10 MG tablet Take 1 tablet (10 mg total) by mouth daily.   No facility-administered encounter medications on file as of 01/01/2021.     Review of Systems  Constitutional:  Negative for appetite change and unexpected weight  change.  HENT:  Negative for sinus pressure.   Respiratory:  Negative for cough, chest tightness and shortness of breath.   Cardiovascular:  Negative for chest pain and palpitations.  Gastrointestinal:  Negative for abdominal pain, diarrhea, nausea and vomiting.  Genitourinary:  Negative for difficulty urinating and dysuria.  Musculoskeletal:  Negative for joint swelling and myalgias.  Skin:  Negative for color change and rash.  Neurological:  Negative for dizziness, light-headedness and headaches.  Psychiatric/Behavioral:  Negative for agitation and dysphoric mood.       Objective:    Physical Exam Vitals reviewed.  Constitutional:      General: She is not in acute distress.  Appearance: Normal appearance.  HENT:     Head: Normocephalic and atraumatic.     Right Ear: External ear normal.     Left Ear: External ear normal.  Eyes:     General: No scleral icterus.       Right eye: No discharge.        Left eye: No discharge.     Conjunctiva/sclera: Conjunctivae normal.  Neck:     Thyroid: No thyromegaly.  Cardiovascular:     Rate and Rhythm: Normal rate and regular rhythm.  Pulmonary:     Effort: No respiratory distress.     Breath sounds: Normal breath sounds. No wheezing.  Abdominal:     General: Bowel sounds are normal.     Palpations: Abdomen is soft.     Tenderness: There is no abdominal tenderness.  Musculoskeletal:        General: No swelling or tenderness.     Cervical back: Neck supple. No tenderness.  Lymphadenopathy:     Cervical: No cervical adenopathy.  Skin:    Findings: No erythema or rash.  Neurological:     Mental Status: She is alert.  Psychiatric:        Mood and Affect: Mood normal.        Behavior: Behavior normal.    BP 128/74   Pulse 78   Temp 97.9 F (36.6 C)   Resp 16   Ht 5\' 3"  (1.6 m)   Wt 194 lb (88 kg)   LMP 07/20/1995   SpO2 98%   BMI 34.37 kg/m  Wt Readings from Last 3 Encounters:  01/01/21 194 lb (88 kg)  11/14/20 195  lb 12.8 oz (88.8 kg)  11/06/20 194 lb (88 kg)     Lab Results  Component Value Date   WBC 4.2 04/11/2020   HGB 13.7 04/11/2020   HCT 41.7 04/11/2020   PLT 256.0 04/11/2020   GLUCOSE 89 12/26/2020   CHOL 211 (H) 12/26/2020   TRIG 142.0 12/26/2020   HDL 49.40 12/26/2020   LDLDIRECT 153.0 07/17/2015   LDLCALC 133 (H) 12/26/2020   ALT 13 12/26/2020   AST 13 12/26/2020   NA 141 12/26/2020   K 4.1 12/26/2020   CL 102 12/26/2020   CREATININE 0.63 12/26/2020   BUN 14 12/26/2020   CO2 29 12/26/2020   TSH 1.32 04/11/2020       Assessment & Plan:   Problem List Items Addressed This Visit     Breast cancer screening    Mammogram ordered.  She will schedule mammogram.        GERD (gastroesophageal reflux disease)    Upper symptoms controlled on prilosec.        GOLD GRADE A COPD with Centrilobular Emphysema    Saw Dr. Callie Fielding recently as outlined.  Started on Advair.  Continues Combivent.  He recommended 2L oxygen at night.  Declined.   Breathing stable.  Follow.        History of colonic polyps    Colonoscopy 12/05/14 - one diminutive polyp in the ascending colon, internal hemorrhoids, otherwise normal.  Pathology - tubular adenoma.  Recommended f/u colonoscopy in 11/2019.  Overdue f/u colonoscopy.  Refer to GI.         Relevant Orders   Ambulatory referral to Gastroenterology   Hypercholesterolemia    On pravastatin.  Low cholesterol diet and exercise.  Follow lipid panel and liver function tests.         Relevant Medications   pravastatin (PRAVACHOL)  20 MG tablet   Other Relevant Orders   Hepatic function panel   CBC with Differential/Platelet   Lipid panel   Basic metabolic panel   Osteoporosis    Continue prolia - given 07/16/20       Relevant Orders   VITAMIN D 25 Hydroxy (Vit-D Deficiency, Fractures)   Stress    Overall appears to be handling things well.  Follow.        Thyroid nodule    Saw endocrinology.  Recommended f/u ultrasound and TSH in 12  months.         Relevant Orders   TSH   Other Visit Diagnoses     Visit for screening mammogram    -  Primary   Relevant Orders   MM 3D SCREEN BREAST BILATERAL        Einar Pheasant, MD

## 2021-01-07 ENCOUNTER — Telehealth: Payer: Self-pay | Admitting: Internal Medicine

## 2021-01-07 ENCOUNTER — Encounter: Payer: Self-pay | Admitting: Internal Medicine

## 2021-01-07 DIAGNOSIS — Z1239 Encounter for other screening for malignant neoplasm of breast: Secondary | ICD-10-CM | POA: Insufficient documentation

## 2021-01-07 NOTE — Assessment & Plan Note (Signed)
On pravastatin.  Low cholesterol diet and exercise.  Follow lipid panel and liver function tests.   

## 2021-01-07 NOTE — Assessment & Plan Note (Signed)
Colonoscopy 12/05/14 - one diminutive polyp in the ascending colon, internal hemorrhoids, otherwise normal.  Pathology - tubular adenoma.  Recommended f/u colonoscopy in 11/2019.  Overdue f/u colonoscopy.  Refer to GI.

## 2021-01-07 NOTE — Assessment & Plan Note (Signed)
Upper symptoms controlled on prilosec.  

## 2021-01-07 NOTE — Assessment & Plan Note (Signed)
Mammogram ordered.  She will schedule mammogram.

## 2021-01-07 NOTE — Assessment & Plan Note (Addendum)
Saw Dr. Callie Fielding recently as outlined.  Started on Advair.  Continues Combivent.  He recommended 2L oxygen at night.  Declined.   Breathing stable.  Follow.

## 2021-01-07 NOTE — Telephone Encounter (Signed)
She had prolia 07/16/20.  Do I need to do anything for her to get her next prolia injection. Coming up due.  Thanks

## 2021-01-07 NOTE — Assessment & Plan Note (Signed)
Continue prolia - given 07/16/20

## 2021-01-07 NOTE — Telephone Encounter (Signed)
She needs the Vitamin D you have ordered back and I will have to attain authorization to complete here in the office this will take at least 7 -10 business days I will start the process, Her bone density was 2019 -2.5 at the spine. I will start the process but we will need the Vitamin d and documentation showing she is taking calcium and Vit d supplementation. It may be later than July 5 th  before we can get approval but I will try.

## 2021-01-07 NOTE — Telephone Encounter (Signed)
Sorry Juliann Pulse.  My mistake.  She is seeing Dr Gabriel Carina and she is following.  Thanks.

## 2021-01-07 NOTE — Assessment & Plan Note (Signed)
Saw endocrinology.  Recommended f/u ultrasound and TSH in 12 months.   

## 2021-01-07 NOTE — Assessment & Plan Note (Signed)
Overall appears to be handling things well.  Follow.  ?

## 2021-02-05 ENCOUNTER — Other Ambulatory Visit: Admission: RE | Admit: 2021-02-05 | Payer: Medicare HMO | Source: Ambulatory Visit

## 2021-02-05 ENCOUNTER — Ambulatory Visit
Admission: RE | Admit: 2021-02-05 | Discharge: 2021-02-05 | Disposition: A | Discharge status: Home / Self Care | Payer: Medicare HMO | Source: Ambulatory Visit | Attending: Internal Medicine | Admitting: Internal Medicine

## 2021-02-05 ENCOUNTER — Other Ambulatory Visit: Payer: Self-pay

## 2021-02-05 DIAGNOSIS — Z1231 Encounter for screening mammogram for malignant neoplasm of breast: Secondary | ICD-10-CM

## 2021-02-05 IMAGING — MG MM DIGITAL SCREENING BILAT W/ TOMO AND CAD
8 series · 8 of 24 positions shown · non-contrast
Comparison: Previous exam(s).

CLINICAL DATA: Screening.

EXAM:
DIGITAL SCREENING BILATERAL MAMMOGRAM WITH TOMOSYNTHESIS AND CAD
TECHNIQUE: Bilateral screening digital craniocaudal and mediolateral oblique
mammograms were obtained. Bilateral screening digital breast
tomosynthesis was performed. The images were evaluated with
computer-aided detection.

[L CC synth-2D]
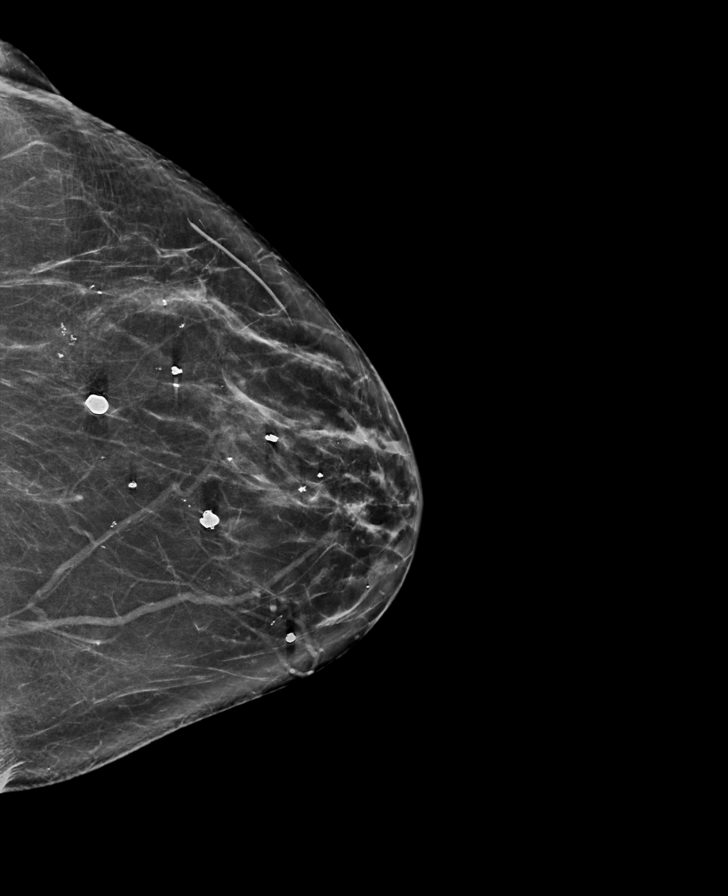

[R CC synth-2D]
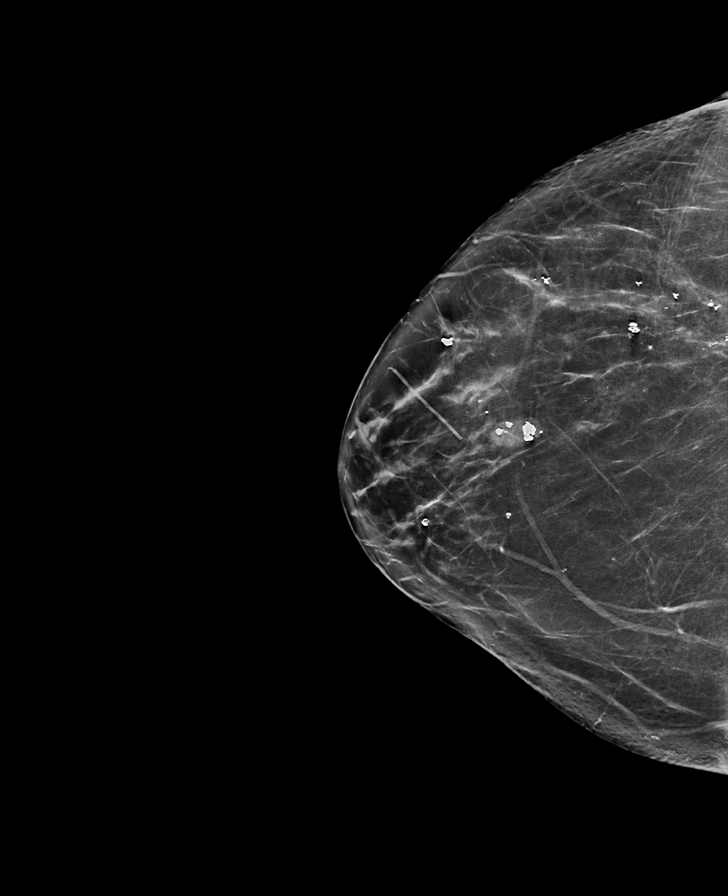

[L MLO synth-2D]
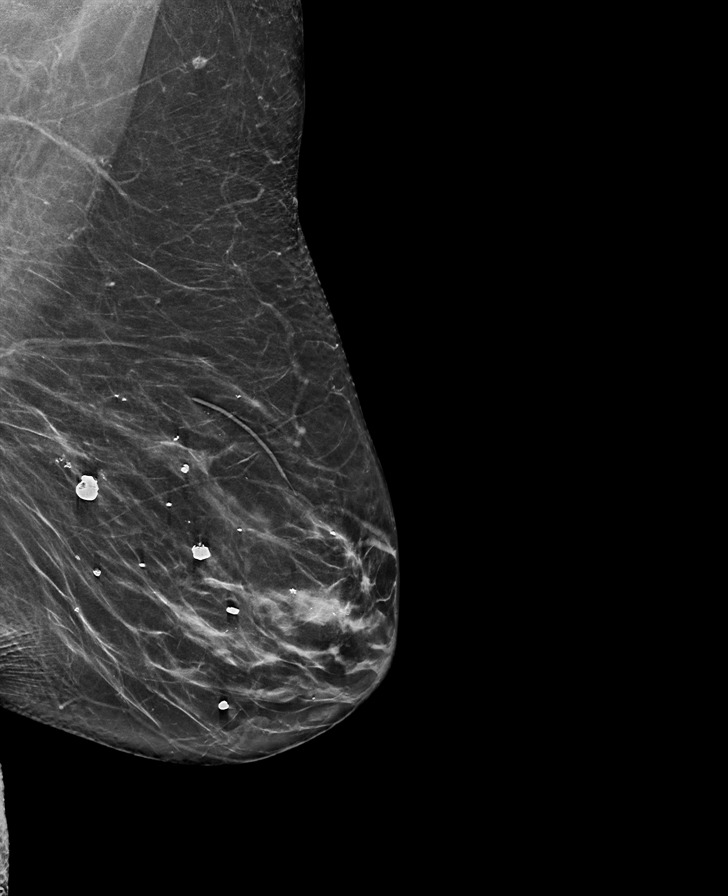

[R MLO synth-2D]
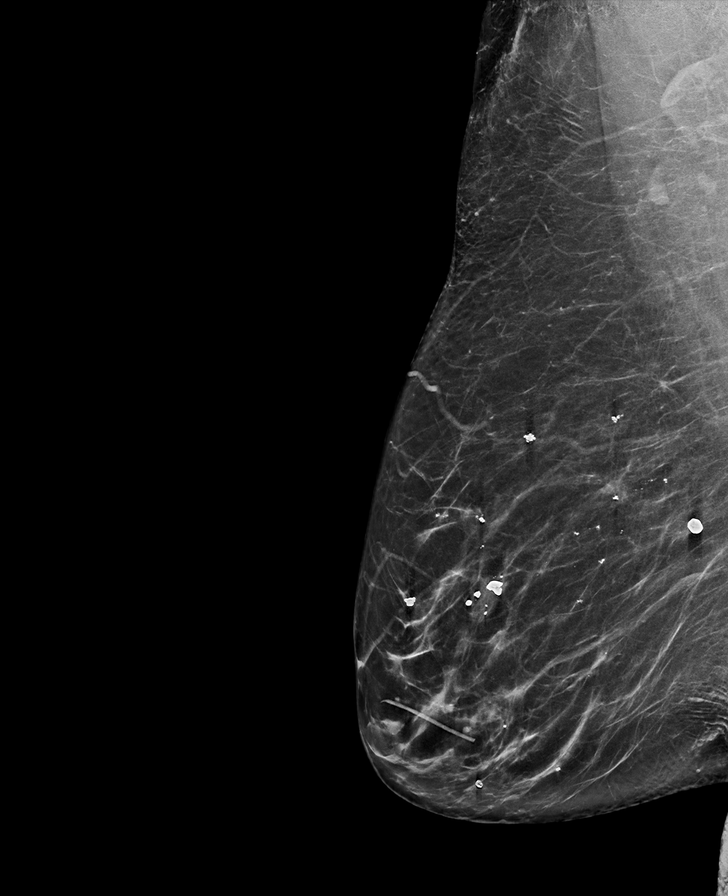

[L CC tomo · tomo slice 35/69.0]
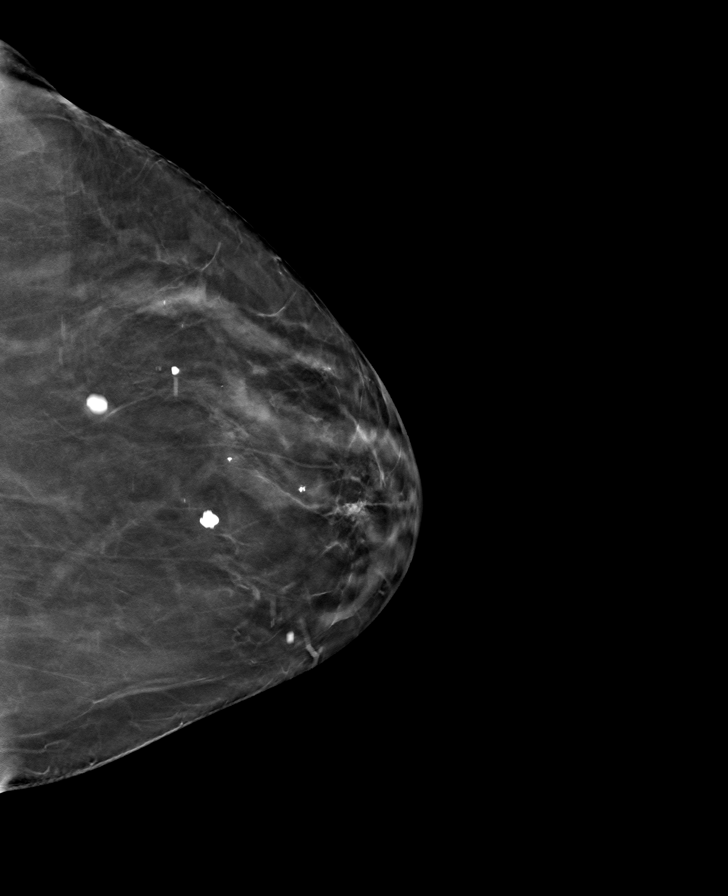

[L MLO tomo · tomo slice 35/69.0]
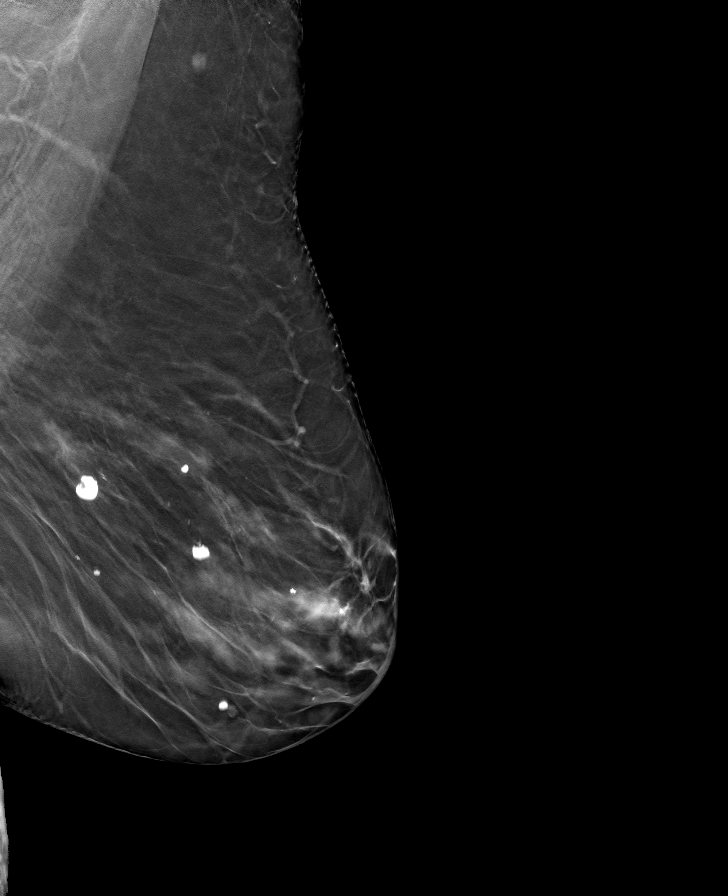

[R CC tomo · tomo slice 35/70.0]
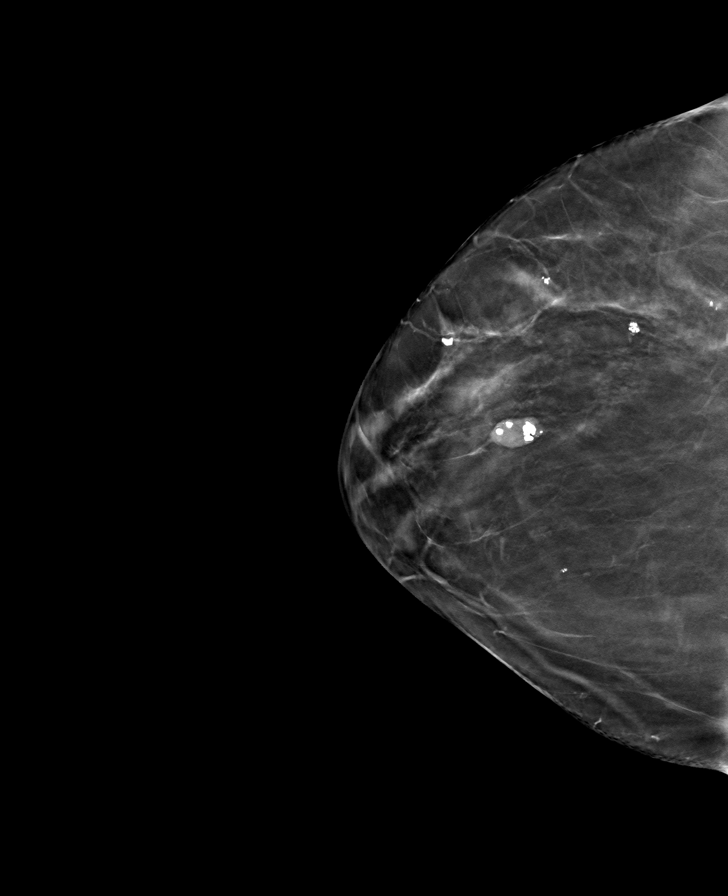

[R MLO tomo · tomo slice 37/73.0]
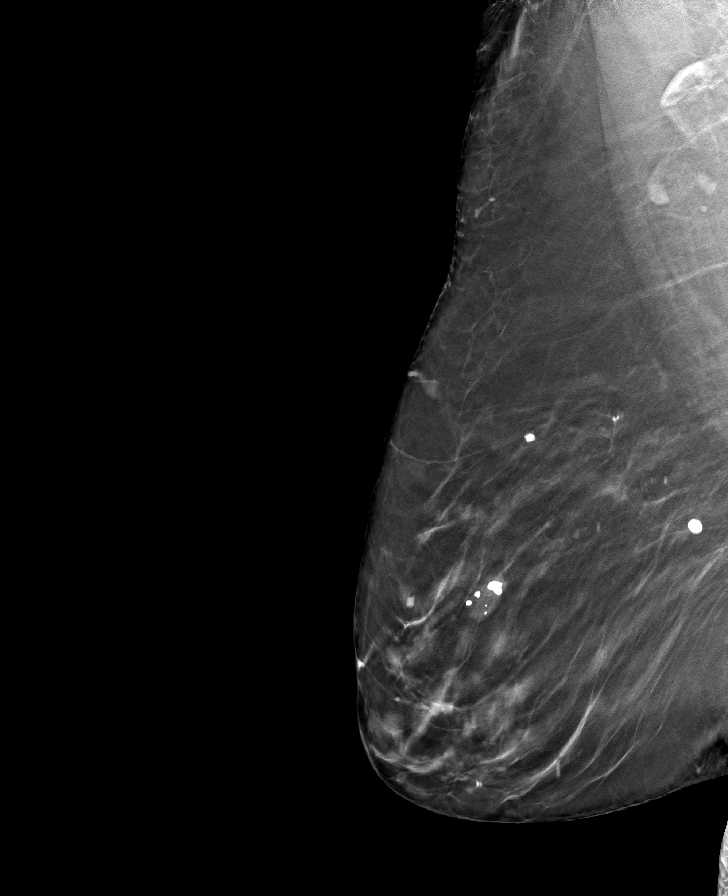

[8 of 24 positions shown; findings below may reference images not displayed]

ACR Breast Density Category b: There are scattered areas of
fibroglandular density.
FINDINGS: There are no findings suspicious for malignancy.
IMPRESSION: No mammographic evidence of malignancy. A result letter of this
screening mammogram will be mailed directly to the patient.

RECOMMENDATION:
Screening mammogram in one year. (Code:[BY])

BI-RADS CATEGORY  1: Negative.

## 2021-02-06 ENCOUNTER — Ambulatory Visit: Payer: Medicare HMO | Attending: Internal Medicine

## 2021-02-06 DIAGNOSIS — Z87891 Personal history of nicotine dependence: Secondary | ICD-10-CM | POA: Insufficient documentation

## 2021-02-06 DIAGNOSIS — J449 Chronic obstructive pulmonary disease, unspecified: Secondary | ICD-10-CM | POA: Diagnosis not present

## 2021-02-12 ENCOUNTER — Telehealth: Payer: Medicare HMO

## 2021-02-19 ENCOUNTER — Ambulatory Visit
Admission: EM | Admit: 2021-02-19 | Discharge: 2021-02-19 | Disposition: A | Discharge status: Home / Self Care | Payer: Medicare HMO | Attending: Emergency Medicine | Admitting: Emergency Medicine

## 2021-02-19 ENCOUNTER — Other Ambulatory Visit: Payer: Self-pay

## 2021-02-19 ENCOUNTER — Encounter: Payer: Self-pay | Admitting: Emergency Medicine

## 2021-02-19 DIAGNOSIS — R300 Dysuria: Secondary | ICD-10-CM

## 2021-02-19 DIAGNOSIS — R059 Cough, unspecified: Secondary | ICD-10-CM

## 2021-02-19 DIAGNOSIS — R5383 Other fatigue: Secondary | ICD-10-CM

## 2021-02-19 LAB — POCT URINALYSIS DIP (MANUAL ENTRY)
Bilirubin, UA: NEGATIVE
Blood, UA: NEGATIVE
Glucose, UA: NEGATIVE mg/dL
Ketones, POC UA: NEGATIVE mg/dL
Nitrite, UA: NEGATIVE
Protein Ur, POC: NEGATIVE mg/dL
Spec Grav, UA: 1.015 (ref 1.010–1.025)
Urobilinogen, UA: 0.2 E.U./dL
pH, UA: 5 (ref 5.0–8.0)

## 2021-02-19 MED ORDER — SULFAMETHOXAZOLE-TRIMETHOPRIM 800-160 MG PO TABS: 1.0000 | ORAL_TABLET | Freq: Two times a day (BID) | ORAL | 0 refills | Status: AC

## 2021-02-19 NOTE — ED Triage Notes (Signed)
Pt here with known UTI over a week ago. Just finished abx and is still feeling burning and feeling tired.

## 2021-02-19 NOTE — Discharge Instructions (Addendum)
Take the antibiotic as directed.  The urine culture is pending.  We will call you if it shows the need to change or discontinue your antibiotic.    Follow up with your primary care provider if your symptoms are not improving.    

## 2021-02-19 NOTE — ED Provider Notes (Signed)
Roderic Palau    CSN: [id number] Arrival date & time: 02/19/21  1346      History   Chief Complaint Chief Complaint  Patient presents with  . Dysuria    HPI Alexis Reed is a 75 y.o. female.  Patient presents with dysuria for 1 week.  She reports history of frequent UTIs and has been evaluated by urology.  She had a prescription for Macrobid leftover from her previous urology visit and took this at the onset of her urinary symptoms.  She is completed 5 days of the Macrobid but still has dysuria.  She also reports fatigue and occasional cough.  She denies fever, chills, abdominal pain, back pain, flank pain, shortness of breath, chest pain, or other symptoms.  Her medical history includes COPD, former smoker.  The history is provided by the patient and medical records.   Past Medical History:  Diagnosis Date  . Arthritis   . Benign breast lumps    multiple lumps biopsy and remove x's 5 last being 1979 by Dr. Sharlet Salina  . COPD (chronic obstructive pulmonary disease) ([facility])    MILD-RARELY EVER HAS TO USE COMBIVENT INHALER  . Fibroids    s/p hysterectomy  . GERD (gastroesophageal reflux disease)   . Hypercholesterolemia   . Osteoporosis   . Personal history of tobacco use, presenting hazards to health 08/07/2015  . Urinary tract infection     Patient Active Problem List   Diagnosis Date Noted  . Breast cancer screening 01/07/2021  . 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 Breast nodule 06/10/2020  . Thyroid nodule 05/29/2020  . Asymmetry of clavicles 05/07/2020  . Leg wound, right 01/16/2020  . Laceration of skin of knee 01/10/2020  . Palpitations 10/10/2019  . Breast pain 10/10/2019  . 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 Abdominal pain 08/22/2018  . History of colonic polyps 02/04/2016  . Personal history of tobacco use, presenting hazards to health 08/07/2015  . Health care maintenance 11/18/2014  . Breast calcifications on mammogram 10/18/2014  . Obesity 07/23/2014  .  GOLD GRADE A COPD with Centrilobular Emphysema 04/26/2014  . Tobacco use disorder 04/26/2014  . Abnormal mammogram 04/12/2014  . UTI (urinary tract infection) 04/02/2014  . SOB (shortness of breath) 02/25/2014  . Stress 02/25/2014  . Internal hemorrhoids without complication [id number]  . Osteoporosis 06/18/2012  . GERD (gastroesophageal reflux disease) 06/18/2012  . Hypercholesterolemia 06/18/2012    Past Surgical History:  Procedure Laterality Date  . ABDOMINAL HYSTERECTOMY  1997   with bilateral oophorectomy  . BREAST BIOPSY Left    multiple done  . BREAST BIOPSY Right 2016   stereo- neg. FC changes  . BREAST EXCISIONAL BIOPSY Right 1975   multiple biopsies done  . BREAST SURGERY Left    lump removed. Unsure of date  . BREAST SURGERY Right    lump removed. Unsure of date  . COLONOSCOPY  2010   Dr. Vira Agar  . KNEE ARTHROSCOPY WITH LATERAL MENISECTOMY Right 05/08/2020   Procedure: RIGHT KNEE ARTHROSCOPY WITH DEBRIDEMENT AND PARTIAL LATERAL MENISCECTOMY;  Surgeon: Corky Mull, MD;  Location: [facility];  Service: Orthopedics;  Laterality: Right;  . LAPAROSCOPIC APPENDECTOMY N/A 12/21/2018   Procedure: APPENDECTOMY LAPAROSCOPIC CONVERTED TO OPEN;  Surgeon: Benjamine Sprague, DO;  Location: [facility];  Service: General;  Laterality: N/A;  . TONSILLECTOMY      OB History     Gravida  2   Para  2   Term      Preterm  AB      Living  2      SAB      IAB      Ectopic      Multiple      Live Births           Obstetric Comments  1st Menstrual Cycle:  12 1st Pregnancy: 30          Home Medications    Prior to Admission medications   Medication Sig Start Date End Date Taking? Authorizing Provider  sulfamethoxazole-trimethoprim (BACTRIM DS) 800-160 MG tablet Take 1 tablet by mouth 2 (two) times daily for 5 days. 02/19/21 02/24/21 Yes Sharion Balloon, NP  acetaminophen (TYLENOL) 500 MG tablet Take 1,000 mg by mouth every 6 (six) hours as needed for moderate  pain.    [provider]  Calcium Carb-Cholecalciferol (CALCIUM 500 +D) 500-400 MG-UNIT TABS Take 1 tablet by mouth daily.    [provider]  Cholecalciferol (VITAMIN D) 2000 UNITS tablet Take 2,000 Units by mouth daily.    [provider]  denosumab (PROLIA) 60 MG/ML SOSY injection Inject 60 mg into the skin every 6 (six) months.     [provider]  fluticasone-salmeterol (ADVAIR HFA) 230-21 MCG/ACT inhaler Inhale 2 puffs into the lungs 2 (two) times daily. 11/14/20   Flora Lipps, MD  ibuprofen (ADVIL) 800 MG tablet Take 1 tablet (800 mg total) by mouth every 8 (eight) hours as needed for mild pain or moderate pain. 12/22/18   Lysle Pearl, Isami, DO  Ipratropium-Albuterol (COMBIVENT RESPIMAT) 20-100 MCG/ACT AERS respimat Inhale 1 puff into the lungs every 6 (six) hours as needed for wheezing. 11/14/20   Flora Lipps, MD  nitrofurantoin (MACRODANTIN) 50 MG capsule Take 1 capsule (50 mg total) by mouth daily. 08/07/20   Billey Co, MD  omeprazole (PRILOSEC) 20 MG capsule Take 1 capsule (20 mg total) by mouth daily. 08/30/20   Einar Pheasant, MD  pravastatin (PRAVACHOL) 20 MG tablet Take 1 tablet (20 mg total) by mouth daily. 01/01/21   Einar Pheasant, MD  Probiotic Product (PROBIOTIC DAILY PO) Take 1 capsule by mouth daily.    [provider]    Family History Family History  Problem Relation Age of Onset  . Hyperlipidemia Mother   . Thyroid disease Mother   . Hypertension Mother   . Colon cancer Father   . Stroke Father   . Heart disease Father        myocardial infarction  . Breast cancer Neg Hx     Social History Social History   Tobacco Use  . Smoking status: Former    Packs/day: 1.00    Years: 30.00    Pack years: 30.00    Types: Cigarettes    Quit date: 04/26/2004    Years since quitting: 16.8  . Smokeless tobacco: Never  Vaping Use  . Vaping Use: Never used  Substance Use Topics  . Alcohol use: Yes    Alcohol/week: 0.0 standard  drinks    Comment: occassional wine every other day  . Drug use: No     Allergies   Boniva [ibandronic acid], Codeine, Fosamax [alendronate sodium], Lipitor [atorvastatin], Meloxicam, Penicillins, and Steri-strip compound benzoin [benzoin compound]   Review of Systems Review of Systems  Constitutional:  Positive for fatigue. Negative for chills and fever.  HENT:  Negative for ear pain and sore throat.   Respiratory:  Positive for cough. Negative for shortness of breath.   Cardiovascular:  Negative for chest  pain and palpitations.  Gastrointestinal:  Negative for abdominal pain and vomiting.  Genitourinary:  Positive for dysuria. Negative for flank pain, hematuria and pelvic pain.  Skin:  Negative for color change and rash.  All other systems reviewed and are negative.   Physical Exam Triage Vital Signs ED Triage Vitals  Enc Vitals Group     BP      Pulse      Resp      Temp      Temp src      SpO2      Weight      Height      Head Circumference      Peak Flow      Pain Score      Pain Loc      Pain Edu?      Excl. in [place]?    No data found.  Updated Vital Signs BP 137/87 (BP Location: Left Arm)   Pulse 100   Temp 98.2 F (36.8 C) (Oral)   Resp 18   LMP 07/20/1995   SpO2 95%   Visual Acuity Right Eye Distance:   Left Eye Distance:   Bilateral Distance:    Right Eye Near:   Left Eye Near:    Bilateral Near:     Physical Exam Vitals and nursing note reviewed.  Constitutional:      General: She is not in acute distress.    Appearance: She is well-developed. She is not ill-appearing.  HENT:     Head: Normocephalic and atraumatic.     Mouth/Throat:     Mouth: Mucous membranes are moist.  Eyes:     Conjunctiva/sclera: Conjunctivae normal.  Cardiovascular:     Rate and Rhythm: Normal rate and regular rhythm.     Heart sounds: Normal heart sounds.  Pulmonary:     Effort: Pulmonary effort is normal. No respiratory distress.     Breath sounds: Normal  breath sounds.  Abdominal:     General: Bowel sounds are normal.     Palpations: Abdomen is soft.     Tenderness: There is no abdominal tenderness. There is no right CVA tenderness, left CVA tenderness, guarding or rebound.  Musculoskeletal:     Cervical back: Neck supple.  Skin:    General: Skin is warm and dry.  Neurological:     General: No focal deficit present.     Mental Status: She is alert and oriented to person, place, and time.     Gait: Gait normal.  Psychiatric:        Mood and Affect: Mood normal.        Behavior: Behavior normal.     UC Treatments / Results  Labs (all labs ordered are listed, but only abnormal results are displayed) Labs Reviewed  POCT URINALYSIS DIP (MANUAL ENTRY) - Abnormal; Notable for the following components:      Result Value   Leukocytes, UA Small (1+) (*)    All other components within normal limits  URINE CULTURE    EKG   Radiology No results found.  Procedures Procedures (including critical care time)  Medications Ordered in UC Medications - No data to display  Initial Impression / Assessment and Plan / UC Course  I have reviewed the triage vital signs and the nursing notes.  Pertinent labs & imaging results that were available during my care of the patient were reviewed by me and considered in my medical decision making (see chart for details).  Dysuria,  fatigue, cough.  Urine culture pending.  Treating with Septra DS; Patient just completed 5-day course of Macrobid and has facial swelling with penicillin.  Discussed that we will call her if the urine culture shows the need to change or discontinue the antibiotic.  Instructed her to follow-up with her PCP or urologist if her symptoms are not improving.  Her lungs are clear today and she is in no acute distress.  Her vital signs are stable and her exam is reassuring.  She agrees to plan of care.   Final Clinical Impressions(s) / UC Diagnoses   Final diagnoses:  Dysuria   Fatigue, unspecified type  Cough     Discharge Instructions      Take the antibiotic as directed.  The urine culture is pending.  We will call you if it shows the need to change or discontinue your antibiotic.    Follow up with your primary care provider if your symptoms are not improving.         ED Prescriptions     Medication Sig Dispense Auth. Provider   sulfamethoxazole-trimethoprim (BACTRIM DS) 800-160 MG tablet Take 1 tablet by mouth 2 (two) times daily for 5 days. 10 tablet Sharion Balloon, NP      PDMP not reviewed this encounter.   Sharion Balloon, NP 02/19/21 1441

## 2021-02-20 ENCOUNTER — Ambulatory Visit: Payer: Medicare HMO | Admitting: Urology

## 2021-02-21 LAB — URINE CULTURE

## 2021-04-12 ENCOUNTER — Ambulatory Visit: Payer: Medicare HMO | Admitting: Gastroenterology

## 2021-05-08 ENCOUNTER — Other Ambulatory Visit: Payer: Self-pay

## 2021-05-08 ENCOUNTER — Other Ambulatory Visit (INDEPENDENT_AMBULATORY_CARE_PROVIDER_SITE_OTHER): Payer: Medicare HMO

## 2021-05-08 DIAGNOSIS — E7800 Pure hypercholesterolemia, unspecified: Secondary | ICD-10-CM | POA: Diagnosis not present

## 2021-05-08 DIAGNOSIS — M810 Age-related osteoporosis without current pathological fracture: Secondary | ICD-10-CM

## 2021-05-08 DIAGNOSIS — E041 Nontoxic single thyroid nodule: Secondary | ICD-10-CM

## 2021-05-08 LAB — CBC WITH DIFFERENTIAL/PLATELET
Basophils Absolute: 0 10*3/uL (ref 0.0–0.1)
Basophils Relative: 0.4 % (ref 0.0–3.0)
Eosinophils Absolute: 0.1 10*3/uL (ref 0.0–0.7)
Eosinophils Relative: 2.4 % (ref 0.0–5.0)
HCT: 41.2 % (ref 36.0–46.0)
Hemoglobin: 13.6 g/dL (ref 12.0–15.0)
Lymphocytes Relative: 18 % (ref 12.0–46.0)
Lymphs Abs: 1.1 10*3/uL (ref 0.7–4.0)
MCHC: 33 g/dL (ref 30.0–36.0)
MCV: 93.7 fl (ref 78.0–100.0)
Monocytes Absolute: 0.5 10*3/uL (ref 0.1–1.0)
Monocytes Relative: 8.5 % (ref 3.0–12.0)
Neutro Abs: 4.3 10*3/uL (ref 1.4–7.7)
Neutrophils Relative %: 70.7 % (ref 43.0–77.0)
Platelets: 267 10*3/uL (ref 150.0–400.0)
RBC: 4.4 Mil/uL (ref 3.87–5.11)
RDW: 13.5 % (ref 11.5–15.5)
WBC: 6.1 10*3/uL (ref 4.0–10.5)

## 2021-05-08 LAB — LIPID PANEL
Cholesterol: 188 mg/dL (ref 0–200)
HDL: 57.4 mg/dL (ref >39.00)
LDL Cholesterol: 110 mg/dL — ABNORMAL HIGH (ref 0–99)
NonHDL: 130.55
Total CHOL/HDL Ratio: 3
Triglycerides: 103 mg/dL (ref 0.0–149.0)
VLDL: 20.6 mg/dL (ref 0.0–40.0)

## 2021-05-08 LAB — BASIC METABOLIC PANEL
BUN: 10 mg/dL (ref 6–23)
CO2: 30 mEq/L (ref 19–32)
Calcium: 9.3 mg/dL (ref 8.4–10.5)
Chloride: 105 mEq/L (ref 96–112)
Creatinine, Ser: 0.6 mg/dL (ref 0.40–1.20)
GFR: 89.2 mL/min (ref >60.00)
Glucose, Bld: 93 mg/dL (ref 70–99)
Potassium: 4.1 mEq/L (ref 3.5–5.1)
Sodium: 142 mEq/L (ref 135–145)

## 2021-05-08 LAB — HEPATIC FUNCTION PANEL
ALT: 13 U/L (ref 0–35)
AST: 14 U/L (ref 0–37)
Albumin: 4.1 g/dL (ref 3.5–5.2)
Alkaline Phosphatase: 69 U/L (ref 39–117)
Bilirubin, Direct: 0.1 mg/dL (ref 0.0–0.3)
Total Bilirubin: 0.6 mg/dL (ref 0.2–1.2)
Total Protein: 6.2 g/dL (ref 6.0–8.3)

## 2021-05-08 LAB — TSH: TSH: 1.75 u[IU]/mL (ref 0.35–5.50)

## 2021-05-08 LAB — VITAMIN D 25 HYDROXY (VIT D DEFICIENCY, FRACTURES): VITD: 20.91 ng/mL — ABNORMAL LOW (ref 30.00–100.00)

## 2021-05-10 ENCOUNTER — Encounter: Payer: Self-pay | Admitting: Internal Medicine

## 2021-05-10 ENCOUNTER — Other Ambulatory Visit: Payer: Self-pay

## 2021-05-10 ENCOUNTER — Ambulatory Visit (INDEPENDENT_AMBULATORY_CARE_PROVIDER_SITE_OTHER): Payer: Medicare HMO | Admitting: Internal Medicine

## 2021-05-10 VITALS — BP 144/82 | HR 77 | Temp 96.6°F | Ht 63.0 in | Wt 195.2 lb

## 2021-05-10 DIAGNOSIS — R000 Tachycardia, unspecified: Secondary | ICD-10-CM | POA: Diagnosis not present

## 2021-05-10 DIAGNOSIS — M810 Age-related osteoporosis without current pathological fracture: Secondary | ICD-10-CM

## 2021-05-10 DIAGNOSIS — E041 Nontoxic single thyroid nodule: Secondary | ICD-10-CM | POA: Diagnosis not present

## 2021-05-10 DIAGNOSIS — E7800 Pure hypercholesterolemia, unspecified: Secondary | ICD-10-CM

## 2021-05-10 DIAGNOSIS — J438 Other emphysema: Secondary | ICD-10-CM | POA: Diagnosis not present

## 2021-05-10 DIAGNOSIS — Z23 Encounter for immunization: Secondary | ICD-10-CM | POA: Diagnosis not present

## 2021-05-10 DIAGNOSIS — F439 Reaction to severe stress, unspecified: Secondary | ICD-10-CM

## 2021-05-10 DIAGNOSIS — R0602 Shortness of breath: Secondary | ICD-10-CM

## 2021-05-10 DIAGNOSIS — K219 Gastro-esophageal reflux disease without esophagitis: Secondary | ICD-10-CM

## 2021-05-10 DIAGNOSIS — Z86010 Personal history of colonic polyps: Secondary | ICD-10-CM

## 2021-05-10 DIAGNOSIS — R002 Palpitations: Secondary | ICD-10-CM

## 2021-05-10 DIAGNOSIS — Z0000 Encounter for general adult medical examination without abnormal findings: Secondary | ICD-10-CM

## 2021-05-10 DIAGNOSIS — E559 Vitamin D deficiency, unspecified: Secondary | ICD-10-CM

## 2021-05-10 NOTE — Progress Notes (Signed)
Patient ID: Alexis Reed, female   DOB: 1946/06/14, 75 y.o.   MRN: [id number]   Subjective:    Patient ID: Alexis Reed, female    DOB: Dec 09, 1945, 75 y.o.   MRN: [id number]  This visit occurred during the SARS-CoV-2 public health emergency.  Safety protocols were in place, including screening questions prior to the visit, additional usage of staff PPE, and extensive cleaning of exam room while observing appropriate contact time as indicated for disinfecting solutions.   Patient here for a physical exam .   HPI She has noticed increased swelling - feet/ankles.  Was an intermittent problem over the summer.  Noticed more over the last couple of months.  States better in am and worsens as day progresses.  Previous knee injury.  Seeing ortho.  Planning for knee surgery in January.  Taking some ibuprofen for her knee.  Has been working at the [employer]  Sitting with legs hanging down.  No chest pain reported.  Does report has noticed some increased sob with increased cleaning, exertion.  No increased cough or congestion.  Some increased heart rate/palpitations - intermittent.  No acid reflux reported.  No abdominal pain.  Bowels moving.     Past Medical History:  Diagnosis Date   Arthritis    Benign breast lumps    multiple lumps biopsy and remove x's 5 last being 1979 by Dr. Sharlet Salina   COPD (chronic obstructive pulmonary disease) ([place])    MILD-RARELY EVER HAS TO USE COMBIVENT INHALER   Fibroids    s/p hysterectomy   GERD (gastroesophageal reflux disease)    Hypercholesterolemia    Osteoporosis    Personal history of tobacco use, presenting hazards to health 08/07/2015   Urinary tract infection    Past Surgical History:  Procedure Laterality Date   ABDOMINAL HYSTERECTOMY  1997   with bilateral oophorectomy   BREAST BIOPSY Left    multiple done   BREAST BIOPSY Right 2016   stereo- neg. FC changes   BREAST EXCISIONAL BIOPSY Right 1975   multiple biopsies done   BREAST  SURGERY Left    lump removed. Unsure of date   BREAST SURGERY Right    lump removed. Unsure of date   COLONOSCOPY  2010   Dr. Vira Agar   KNEE ARTHROSCOPY WITH LATERAL MENISECTOMY Right 05/08/2020   Procedure: RIGHT KNEE ARTHROSCOPY WITH DEBRIDEMENT AND PARTIAL LATERAL MENISCECTOMY;  Surgeon: Corky Mull, MD;  Location: [facility];  Service: Orthopedics;  Laterality: Right;   LAPAROSCOPIC APPENDECTOMY N/A 12/21/2018   Procedure: APPENDECTOMY LAPAROSCOPIC CONVERTED TO OPEN;  Surgeon: Benjamine Sprague, DO;  Location: [facility];  Service: General;  Laterality: N/A;   TONSILLECTOMY     Family History  Problem Relation Age of Onset   Hyperlipidemia Mother    Thyroid disease Mother    Hypertension Mother    Colon cancer Father    Stroke Father    Heart disease Father        myocardial infarction   Breast cancer Neg Hx    Social History   Socioeconomic History   Marital status: Married    Spouse name: Not on file   Number of children: 2   Years of education: Not on file   Highest education level: Not on file  Occupational History   Not on file  Tobacco Use   Smoking status: Former    Packs/day: 1.00    Years: 30.00    Pack years: 30.00    Types: Cigarettes  Quit date: 04/26/2004    Years since quitting: 17.0   Smokeless tobacco: Never  Vaping Use   Vaping Use: Never used  Substance and Sexual Activity   Alcohol use: Yes    Alcohol/week: 0.0 standard drinks    Comment: occassional wine every other day   Drug use: No   Sexual activity: Yes    Birth control/protection: Post-menopausal  Other Topics Concern   Not on file  Social History Narrative   She is married, has two children. Works at [employer] Strain: Not on file  Food Insecurity: Not on file  Transportation Needs: Not on file  Physical Activity: Not on file  Stress: Not on file  Social Connections: Not on file     Review of Systems   Constitutional:  Negative for appetite change and unexpected weight change.  HENT:  Negative for congestion and sinus pressure.   Respiratory:  Negative for cough and chest tightness.        SOB with exertion.   Cardiovascular:  Positive for palpitations and leg swelling. Negative for chest pain.  Gastrointestinal:  Negative for abdominal pain, diarrhea, nausea and vomiting.  Genitourinary:  Negative for difficulty urinating and dysuria.  Musculoskeletal:  Negative for joint swelling and myalgias.  Skin:  Negative for color change and rash.  Neurological:  Negative for dizziness, light-headedness and headaches.  Psychiatric/Behavioral:  Negative for agitation and dysphoric mood.       Objective:     BP (!) 144/82   Pulse 77   Temp (!) 96.6 F (35.9 C) (Skin)   Ht 5\' 3"  (1.6 m)   Wt 195 lb 3.2 oz (88.5 kg)   LMP 07/20/1995   SpO2 96%   BMI 34.58 kg/m  Wt Readings from Last 3 Encounters:  05/10/21 195 lb 3.2 oz (88.5 kg)  01/01/21 194 lb (88 kg)  11/14/20 195 lb 12.8 oz (88.8 kg)    Physical Exam Vitals reviewed.  Constitutional:      General: She is not in acute distress.    Appearance: Normal appearance. She is well-developed.  HENT:     Head: Normocephalic and atraumatic.     Right Ear: External ear normal.     Left Ear: External ear normal.  Eyes:     General: No scleral icterus.       Right eye: No discharge.        Left eye: No discharge.     Conjunctiva/sclera: Conjunctivae normal.  Neck:     Thyroid: No thyromegaly.  Cardiovascular:     Rate and Rhythm: Normal rate and regular rhythm.  Pulmonary:     Effort: No tachypnea, accessory muscle usage or respiratory distress.     Breath sounds: Normal breath sounds. No decreased breath sounds or wheezing.  Chest:  Breasts:    Right: No inverted nipple, mass, nipple discharge or tenderness (no axillary adenopathy).     Left: No inverted nipple, mass, nipple discharge or tenderness (no axilarry adenopathy).   Abdominal:     General: Bowel sounds are normal.     Palpations: Abdomen is soft.     Tenderness: There is no abdominal tenderness.  Musculoskeletal:        General: No tenderness.     Cervical back: Neck supple. No tenderness.     Comments: Increased pedal and ankle edema.   Lymphadenopathy:     Cervical: No cervical adenopathy.  Skin:  Findings: No erythema or rash.  Neurological:     Mental Status: She is alert and oriented to person, place, and time.  Psychiatric:        Mood and Affect: Mood normal.        Behavior: Behavior normal.     Outpatient Encounter Medications as of 05/10/2021  Medication Sig   acetaminophen (TYLENOL) 500 MG tablet Take 1,000 mg by mouth every 6 (six) hours as needed for moderate pain.   Calcium Carb-Cholecalciferol (CALCIUM 500 +D) 500-400 MG-UNIT TABS Take 1 tablet by mouth daily.   Cholecalciferol (VITAMIN D) 2000 UNITS tablet Take 2,000 Units by mouth daily.   denosumab (PROLIA) 60 MG/ML SOSY injection Inject 60 mg into the skin every 6 (six) months.    fluticasone-salmeterol (ADVAIR HFA) 230-21 MCG/ACT inhaler Inhale 2 puffs into the lungs 2 (two) times daily.   ibuprofen (ADVIL) 800 MG tablet Take 1 tablet (800 mg total) by mouth every 8 (eight) hours as needed for mild pain or moderate pain.   Ipratropium-Albuterol (COMBIVENT RESPIMAT) 20-100 MCG/ACT AERS respimat Inhale 1 puff into the lungs every 6 (six) hours as needed for wheezing.   nitrofurantoin (MACRODANTIN) 50 MG capsule Take 1 capsule (50 mg total) by mouth daily.   omeprazole (PRILOSEC) 20 MG capsule Take 1 capsule (20 mg total) by mouth daily.   pravastatin (PRAVACHOL) 20 MG tablet Take 1 tablet (20 mg total) by mouth daily.   Probiotic Product (PROBIOTIC DAILY PO) Take 1 capsule by mouth daily.   No facility-administered encounter medications on file as of 05/10/2021.     Lab Results  Component Value Date   WBC 6.1 05/08/2021   HGB 13.6 05/08/2021   HCT 41.2 05/08/2021    PLT 267.0 05/08/2021   GLUCOSE 93 05/08/2021   CHOL 188 05/08/2021   TRIG 103.0 05/08/2021   HDL 57.40 05/08/2021   LDLDIRECT 153.0 07/17/2015   LDLCALC 110 (H) 05/08/2021   ALT 13 05/08/2021   AST 14 05/08/2021   NA 142 05/08/2021   K 4.1 05/08/2021   CL 105 05/08/2021   CREATININE 0.60 05/08/2021   BUN 10 05/08/2021   CO2 30 05/08/2021   TSH 1.75 05/08/2021       Assessment & Plan:   Problem List Items Addressed This Visit     GERD (gastroesophageal reflux disease)    Upper symptoms controlled on current PPI.        GOLD GRADE A COPD with Centrilobular Emphysema    Has seen Dr Mortimer Fries.  Continue advair.  Combivent.  SOB with exertion as outlined.  Plan cardiac w/up.  No increased cough or congestion.        Health care maintenance    Physical today 05/10/21.  Mammogram 02/06/21 - Birads I.  Colonoscopy 11/2014.  Overdue. Plan f/u colonoscopy.  She request referral to [facility].  Continue prolia.       History of colonic polyps    Colonoscopy 12/05/14 - one diminutive polyp in the ascending colon, internal hemorrhoids, otherwise normal.  Pathology - tubular adenoma.  Recommended f/u colonoscopy in 11/2019.  Overdue f/u colonoscopy.  Refer to GI.  She request referral to [facility].       Relevant Orders   Ambulatory referral to Gastroenterology   Hypercholesterolemia    On pravastatin.  Low cholesterol diet and exercise.  Discussed labs.  Discussed increase pravastatin to 40mg  q day. Follow lipid panel and liver function tests.        Increased  heart rate    EKG as outlined.  Cardiac w/up as outlined.        Relevant Orders   EKG 12-Lead (Completed)   Osteoporosis    Continue prolia - 01/2021.       Palpitations    EKG as outlined.  Further cardiac w/up as outlined.        Relevant Orders   Ambulatory referral to Cardiology   SOB (shortness of breath)    SOB with exertion and intermittent increased heart rate as outlined.  EKG - SR with no acute ischemic  changes.  Discussed further w/up and evaluation.  No increased cough or congestion.  Continue inhalers.  Upcoming surgery planned.  Discussed need for further cardiac evaluation.  She is in agreement.        Relevant Orders   Ambulatory referral to Cardiology   Stress    Overall appears to be handling things well.  Follow.       Thyroid nodule    Saw endocrinology.  Recommended f/u ultrasound and TSH in 12 months.        Vitamin D deficiency    Increase vitamin D to 2000 units per day.  Follow.       Other Visit Diagnoses     Routine general medical examination at a health care facility    -  Primary   Need for immunization against influenza       Relevant Orders   Flu Vaccine QUAD High Dose(Fluad) (Completed)        Einar Pheasant, MD

## 2021-05-11 ENCOUNTER — Encounter: Payer: Self-pay | Admitting: Internal Medicine

## 2021-05-11 DIAGNOSIS — E559 Vitamin D deficiency, unspecified: Secondary | ICD-10-CM | POA: Insufficient documentation

## 2021-05-11 NOTE — Assessment & Plan Note (Signed)
On pravastatin.  Low cholesterol diet and exercise.  Discussed labs.  Discussed increase pravastatin to 40mg  q day. Follow lipid panel and liver function tests.

## 2021-05-11 NOTE — Assessment & Plan Note (Signed)
Upper symptoms controlled on current PPI.   

## 2021-05-11 NOTE — Assessment & Plan Note (Signed)
EKG as outlined.  Further cardiac w/up as outlined.

## 2021-05-11 NOTE — Assessment & Plan Note (Signed)
Physical today 05/10/21.  Mammogram 02/06/21 - Birads I.  Colonoscopy 11/2014.  Overdue. Plan f/u colonoscopy.  She request referral to [facility].  Continue prolia.

## 2021-05-11 NOTE — Assessment & Plan Note (Signed)
Colonoscopy 12/05/14 - one diminutive polyp in the ascending colon, internal hemorrhoids, otherwise normal.  Pathology - tubular adenoma.  Recommended f/u colonoscopy in 11/2019.  Overdue f/u colonoscopy.  Refer to GI.  She request referral to [facility].

## 2021-05-11 NOTE — Assessment & Plan Note (Signed)
Increase vitamin D to 2000 units per day.  Follow.

## 2021-05-11 NOTE — Assessment & Plan Note (Signed)
SOB with exertion and intermittent increased heart rate as outlined.  EKG - SR with no acute ischemic changes.  Discussed further w/up and evaluation.  No increased cough or congestion.  Continue inhalers.  Upcoming surgery planned.  Discussed need for further cardiac evaluation.  She is in agreement.

## 2021-05-11 NOTE — Assessment & Plan Note (Signed)
Continue prolia - 01/2021.

## 2021-05-11 NOTE — Assessment & Plan Note (Signed)
Overall appears to be handling things well.  Follow.  ?

## 2021-05-11 NOTE — Assessment & Plan Note (Signed)
EKG as outlined.  Cardiac w/up as outlined.

## 2021-05-11 NOTE — Assessment & Plan Note (Signed)
Saw endocrinology.  Recommended f/u ultrasound and TSH in 12 months.   

## 2021-05-11 NOTE — Assessment & Plan Note (Signed)
Has seen Dr Mortimer Fries.  Continue advair.  Combivent.  SOB with exertion as outlined.  Plan cardiac w/up.  No increased cough or congestion.

## 2021-05-16 ENCOUNTER — Encounter: Payer: Self-pay | Admitting: Internal Medicine

## 2021-05-17 NOTE — Telephone Encounter (Unsigned)
Called to speak with Alexis Reed and ask if she would like a referral to another GI office to try and get a sooner availability. Pt declined and states that she has Knee Surgery in January. Pt will call [facility] and reschedule to another day.

## 2021-05-17 NOTE — Telephone Encounter (Signed)
Ok to refill pravastatin 40mg  q day.  Regarding her persistent pain, please schedule f/u appt to reevaluate.

## 2021-05-20 ENCOUNTER — Other Ambulatory Visit: Payer: Self-pay

## 2021-05-20 MED ORDER — PRAVASTATIN SODIUM 40 MG PO TABS: 40.0000 mg | ORAL_TABLET | Freq: Every day | ORAL | 1 refills | Status: DC

## 2021-05-20 NOTE — Telephone Encounter (Signed)
40 mg pravastatin has been refilled.

## 2021-05-21 NOTE — Telephone Encounter (Signed)
If she feels she needs to be seen prior to appt, I can work her in.

## 2021-05-21 NOTE — Telephone Encounter (Signed)
Called patient to follow up with her, she sees GI in 3 weeks and symptoms have not changed. She feels like she is ok to wait until then but will come in if necessary. She was not able to get in with [facility] until Jan and would prefer not to wait until then. She is going to keep appt with Dr Marius Ditch 12/2. Do you want to see her prior to her seeing GI?

## 2021-05-22 NOTE — Telephone Encounter (Signed)
Pt will hold off on appt for now but will let me know if she changes her mind.

## 2021-06-14 ENCOUNTER — Encounter: Payer: Self-pay | Admitting: Gastroenterology

## 2021-06-14 ENCOUNTER — Other Ambulatory Visit: Payer: Self-pay

## 2021-06-14 ENCOUNTER — Ambulatory Visit (INDEPENDENT_AMBULATORY_CARE_PROVIDER_SITE_OTHER): Payer: Medicare HMO | Admitting: Gastroenterology

## 2021-06-14 VITALS — BP 125/82 | HR 87 | Temp 98.1°F | Ht 63.0 in | Wt 194.4 lb

## 2021-06-14 DIAGNOSIS — R109 Unspecified abdominal pain: Secondary | ICD-10-CM

## 2021-06-14 DIAGNOSIS — R142 Eructation: Secondary | ICD-10-CM

## 2021-06-14 DIAGNOSIS — Z86010 Personal history of colonic polyps: Secondary | ICD-10-CM | POA: Diagnosis not present

## 2021-06-14 DIAGNOSIS — R1013 Epigastric pain: Secondary | ICD-10-CM

## 2021-06-14 NOTE — Progress Notes (Signed)
Cephas Darby, MD [address]  [place]  [place], [place] [address]  Main: [phone]  Fax: [phone]    Gastroenterology Consultation  Referring Provider:     Einar Pheasant, MD Primary Care Physician:  Einar Pheasant, MD Primary Gastroenterologist:  Dr. Cephas Darby Reason for Consultation:     Left lateral abdominal pain, frequent burping, personal history of colon polyps        HPI:   Alexis Reed is a 75 y.o. female referred by Dr. Einar Pheasant, MD  for consultation & management of left lateral abdominal pain.  Patient reports that she has been experiencing discomfort in the left periumbilical area which is sporadic and mild in severity.  Not associated with change in bowel habits or radiation of pain or abdominal bloating, rectal bleeding or any other GI symptoms.  She denies irregular bowel habits.  Patient underwent appendectomy in 2020.  Patient denies any nausea or vomiting on aggravating or relieving factors.  She just feels not being normal when she has the discomfort.  She had it last night which was transient.  Patient has been taking omeprazole 20 mg daily for past several years to prevent reflux and given long history of NSAID use for arthritis.  She does report of frequent burping only.  NSAIDs: None  Antiplts/Anticoagulants/Anti thrombotics: None  GI Procedures: Colonoscopy in 2016, found to have colon polyps and she was recommended to undergo colonoscopy in 5 years  Past Medical History:  Diagnosis Date   Arthritis    Benign breast lumps    multiple lumps biopsy and remove x's 5 last being 1979 by Dr. Sharlet Salina   COPD (chronic obstructive pulmonary disease) ([facility])    MILD-RARELY EVER HAS TO USE COMBIVENT INHALER   Fibroids    s/p hysterectomy   GERD (gastroesophageal reflux disease)    Hypercholesterolemia    Osteoporosis    Personal history of tobacco use, presenting hazards to health 08/07/2015   Urinary tract infection     Past  Surgical History:  Procedure Laterality Date   ABDOMINAL HYSTERECTOMY  1997   with bilateral oophorectomy   BREAST BIOPSY Left    multiple done   BREAST BIOPSY Right 2016   stereo- neg. FC changes   BREAST EXCISIONAL BIOPSY Right 1975   multiple biopsies done   BREAST SURGERY Left    lump removed. Unsure of date   BREAST SURGERY Right    lump removed. Unsure of date   COLONOSCOPY  2010   Dr. Vira Agar   KNEE ARTHROSCOPY WITH LATERAL MENISECTOMY Right 05/08/2020   Procedure: RIGHT KNEE ARTHROSCOPY WITH DEBRIDEMENT AND PARTIAL LATERAL MENISCECTOMY;  Surgeon: Corky Mull, MD;  Location: [facility] ORS;  Service: Orthopedics;  Laterality: Right;   LAPAROSCOPIC APPENDECTOMY N/A 12/21/2018   Procedure: APPENDECTOMY LAPAROSCOPIC CONVERTED TO OPEN;  Surgeon: Benjamine Sprague, DO;  Location: [facility] ORS;  Service: General;  Laterality: N/A;   TONSILLECTOMY     Current Outpatient Medications:    acetaminophen (TYLENOL) 500 MG tablet, Take 1,000 mg by mouth every 6 (six) hours as needed for moderate pain., Disp: , Rfl:    Calcium Carb-Cholecalciferol (CALCIUM 500 +D) 500-400 MG-UNIT TABS, Take 1 tablet by mouth daily., Disp: , Rfl:    Cholecalciferol (VITAMIN D) 2000 UNITS tablet, Take 2,000 Units by mouth daily., Disp: , Rfl:    denosumab (PROLIA) 60 MG/ML SOSY injection, Inject 60 mg into the skin every 6 (six) months. , Disp: , Rfl:  denosumab (PROLIA) 60 MG/ML SOSY injection, Inject into the skin., Disp: , Rfl:    fluticasone-salmeterol (ADVAIR HFA) 230-21 MCG/ACT inhaler, Inhale 2 puffs into the lungs 2 (two) times daily., Disp: 1 each, Rfl: 12   ibuprofen (ADVIL) 800 MG tablet, Take 1 tablet (800 mg total) by mouth every 8 (eight) hours as needed for mild pain or moderate pain., Disp: 30 tablet, Rfl: 0   Ipratropium-Albuterol (COMBIVENT RESPIMAT) 20-100 MCG/ACT AERS respimat, Inhale 1 puff into the lungs every 6 (six) hours as needed for wheezing., Disp: 1 each, Rfl: 1   nitrofurantoin (MACRODANTIN) 50  MG capsule, Take 1 capsule (50 mg total) by mouth daily., Disp: 30 capsule, Rfl: 2   omeprazole (PRILOSEC) 20 MG capsule, Take 1 capsule (20 mg total) by mouth daily., Disp: 90 capsule, Rfl: 1   pravastatin (PRAVACHOL) 40 MG tablet, Take 1 tablet (40 mg total) by mouth daily., Disp: 90 tablet, Rfl: 1   Probiotic Product (PROBIOTIC DAILY PO), Take 1 capsule by mouth daily., Disp: , Rfl:     Family History  Problem Relation Age of Onset   Hyperlipidemia Mother    Thyroid disease Mother    Hypertension Mother    Colon cancer Father    Stroke Father    Heart disease Father        myocardial infarction   Breast cancer Neg Hx      Social History   Tobacco Use   Smoking status: Former    Packs/day: 1.00    Years: 30.00    Pack years: 30.00    Types: Cigarettes    Quit date: 04/26/2004    Years since quitting: 17.1   Smokeless tobacco: Never  Vaping Use   Vaping Use: Never used  Substance Use Topics   Alcohol use: Yes    Alcohol/week: 0.0 standard drinks    Comment: occassional wine every other day   Drug use: No    Allergies as of 06/14/2021 - Review Complete 06/14/2021  Allergen Reaction Noted   Benzoin compound Other (See Comments) 05/07/2020   Boniva [ibandronic acid] Other (See Comments) 06/18/2012   Codeine Nausea Only 06/18/2012   Fosamax [alendronate sodium]  06/18/2012   Lipitor [atorvastatin] Hives 10/17/2014   Meloxicam Other (See Comments) 04/30/2020   Penicillins Swelling 06/18/2012    Review of Systems:    All systems reviewed and negative except where noted in HPI.   Physical Exam:  BP 125/82 (BP Location: Right Arm, Patient Position: Sitting, Cuff Size: Normal)   Pulse 87   Temp 98.1 F (36.7 C) (Temporal)   Ht 5\' 3"  (1.6 m)   Wt 194 lb 6.4 oz (88.2 kg)   LMP 07/20/1995   BMI 34.44 kg/m  Patient's last menstrual period was 07/20/1995.  General:   Alert,  Well-developed, well-nourished, pleasant and cooperative in NAD Head:  Normocephalic and  atraumatic. Eyes:  Sclera clear, no icterus.   Conjunctiva pink. Ears:  Normal auditory acuity. Nose:  No deformity, discharge, or lesions. Mouth:  No deformity or lesions,oropharynx pink & moist. Neck:  Supple; no masses or thyromegaly. Lungs:  Respirations even and unlabored.  Clear throughout to auscultation.   No wheezes, crackles, or rhonchi. No acute distress. Heart:  Regular rate and rhythm; no murmurs, clicks, rubs, or gallops. Abdomen:  Normal bowel sounds. Soft, mild tenderness in left mid lateral quadrant to deep palpation only and non-distended without masses, hepatosplenomegaly or hernias noted.  No guarding or rebound tenderness.   Rectal: Not performed Msk:  Symmetrical without gross deformities. Good, equal movement & strength bilaterally. Pulses:  Normal pulses noted. Extremities:  No clubbing or edema.  No cyanosis. Neurologic:  Alert and oriented x3;  grossly normal neurologically. Skin:  Intact without significant lesions or rashes. No jaundice. Psych:  Alert and cooperative. Normal mood and affect.  Imaging Studies: Reviewed  Assessment and Plan:   KINDELL STRADA is a 75 y.o. pleasant Caucasian female with obesity, BMI 34 is seen in consultation for chronic history of f left sided abdominal pain not associated with any aggravating or relieving factors or any other GI symptoms.  She does have history of long-term NSAID use and none low-dose omeprazole  Personal history of colon polyps and left-sided abdominal pain, s/p appendectomy in 2020 Recommend colonoscopy for further evaluation If colonoscopy is unremarkable, recommend cross-sectional imaging of the abdomen and pelvis  Long-term NSAID use and frequent burping Recommend upper endoscopy for further evaluation   Follow up after above work-up   Cephas Darby, MD

## 2021-06-14 NOTE — Addendum Note (Signed)
Addended by: Eliseo Squires on: 06/14/2021 10:27 AM   Modules accepted: Orders

## 2021-06-17 ENCOUNTER — Encounter
Admission: RE | Disposition: A | Discharge status: Home / Self Care | Payer: Self-pay | Source: Home / Self Care | Attending: Gastroenterology

## 2021-06-17 ENCOUNTER — Ambulatory Visit
Admission: RE | Admit: 2021-06-17 | Discharge: 2021-06-17 | Disposition: A | Discharge status: Home / Self Care | Payer: Medicare HMO | Attending: Gastroenterology | Admitting: Gastroenterology

## 2021-06-17 ENCOUNTER — Ambulatory Visit: Payer: Medicare HMO | Admitting: Certified Registered Nurse Anesthetist

## 2021-06-17 DIAGNOSIS — K644 Residual hemorrhoidal skin tags: Secondary | ICD-10-CM | POA: Insufficient documentation

## 2021-06-17 DIAGNOSIS — R1013 Epigastric pain: Secondary | ICD-10-CM | POA: Diagnosis not present

## 2021-06-17 DIAGNOSIS — K30 Functional dyspepsia: Secondary | ICD-10-CM | POA: Insufficient documentation

## 2021-06-17 DIAGNOSIS — Z1211 Encounter for screening for malignant neoplasm of colon: Secondary | ICD-10-CM | POA: Diagnosis present

## 2021-06-17 DIAGNOSIS — Z86010 Personal history of colonic polyps: Secondary | ICD-10-CM

## 2021-06-17 DIAGNOSIS — D120 Benign neoplasm of cecum: Secondary | ICD-10-CM | POA: Diagnosis not present

## 2021-06-17 DIAGNOSIS — R142 Eructation: Secondary | ICD-10-CM

## 2021-06-17 DIAGNOSIS — K317 Polyp of stomach and duodenum: Secondary | ICD-10-CM | POA: Diagnosis not present

## 2021-06-17 DIAGNOSIS — K635 Polyp of colon: Secondary | ICD-10-CM | POA: Diagnosis not present

## 2021-06-17 DIAGNOSIS — Z87891 Personal history of nicotine dependence: Secondary | ICD-10-CM | POA: Insufficient documentation

## 2021-06-17 HISTORY — PX: COLONOSCOPY WITH PROPOFOL: SHX5780

## 2021-06-17 HISTORY — PX: ESOPHAGOGASTRODUODENOSCOPY (EGD) WITH PROPOFOL: SHX5813

## 2021-06-17 SURGERY — COLONOSCOPY WITH PROPOFOL
Anesthesia: General

## 2021-06-17 MED ORDER — LIDOCAINE HCL (CARDIAC) PF 100 MG/5ML IV SOSY
PREFILLED_SYRINGE | INTRAVENOUS | Status: DC | PRN
  Administered 2021-06-17: 100 mg via INTRAVENOUS

## 2021-06-17 MED ORDER — SODIUM CHLORIDE 0.9 % IV SOLN
INTRAVENOUS | Status: DC
  Administered 2021-06-17: 20 mL/h via INTRAVENOUS

## 2021-06-17 MED ORDER — PHENYLEPHRINE 40 MCG/ML (10ML) SYRINGE FOR IV PUSH (FOR BLOOD PRESSURE SUPPORT)
PREFILLED_SYRINGE | INTRAVENOUS | Status: DC | PRN
  Administered 2021-06-17 (×2): 40 ug via INTRAVENOUS

## 2021-06-17 MED ORDER — GLYCOPYRROLATE 0.2 MG/ML IJ SOLN
INTRAMUSCULAR | Status: DC | PRN
  Administered 2021-06-17: .2 mg via INTRAVENOUS

## 2021-06-17 MED ORDER — PROPOFOL 10 MG/ML IV BOLUS
INTRAVENOUS | Status: DC | PRN
  Administered 2021-06-17: 30 mg via INTRAVENOUS
  Administered 2021-06-17: 70 mg via INTRAVENOUS
  Administered 2021-06-17: 30 mg via INTRAVENOUS

## 2021-06-17 MED ORDER — PROPOFOL 500 MG/50ML IV EMUL
INTRAVENOUS | Status: DC | PRN
  Administered 2021-06-17: 150 ug/kg/min via INTRAVENOUS

## 2021-06-17 MED ORDER — SODIUM CHLORIDE 0.9 % IV SOLN
INTRAVENOUS | Status: AC | PRN
  Administered 2021-06-17: 3 mL via INTRAMUSCULAR

## 2021-06-17 NOTE — Op Note (Signed)
[facility] Gastroenterology Patient Name: Alexis Reed Procedure Date: 06/17/2021 11:25 AM MRN: [id number] Account #: [id number] Date of Birth: 09-21-1945 Admit Type: Outpatient Age: 75 Room: [facility] ENDO ROOM 1 Gender: Female Note Status: Finalized Instrument Name: Colonoscope [id number] Procedure:             Colonoscopy Indications:           High risk colon cancer surveillance: Personal history                         of colonic polyps, Last colonoscopy: March 2010 Providers:             Lin Landsman MD, MD Medicines:             General Anesthesia Complications:         No immediate complications. Estimated blood loss: None. Procedure:             Pre-Anesthesia Assessment:                        - Prior to the procedure, a History and Physical was                         performed, and patient medications and allergies were                         reviewed. The patient is competent. The risks and                         benefits of the procedure and the sedation options and                         risks were discussed with the patient. All questions                         were answered and informed consent was obtained.                         Patient identification and proposed procedure were                         verified by the physician, the nurse, the                         anesthesiologist, the anesthetist and the technician                         in the pre-procedure area in the procedure room in the                         endoscopy suite. Mental Status Examination: alert and                         oriented. Airway Examination: normal oropharyngeal                         airway and neck mobility. Respiratory Examination:  clear to auscultation. CV Examination: normal.                         Prophylactic Antibiotics: The patient does not require                         prophylactic antibiotics. Prior Anticoagulants:  The                         patient has taken no previous anticoagulant or                         antiplatelet agents. ASA Grade Assessment: III - A                         patient with severe systemic disease. After reviewing                         the risks and benefits, the patient was deemed in                         satisfactory condition to undergo the procedure. The                         anesthesia plan was to use general anesthesia.                         Immediately prior to administration of medications,                         the patient was re-assessed for adequacy to receive                         sedatives. The heart rate, respiratory rate, oxygen                         saturations, blood pressure, adequacy of pulmonary                         ventilation, and response to care were monitored                         throughout the procedure. The physical status of the                         patient was re-assessed after the procedure.                        After obtaining informed consent, the colonoscope was                         passed under direct vision. Throughout the procedure,                         the patient's blood pressure, pulse, and oxygen                         saturations were monitored continuously. The  Colonoscope was introduced through the anus and                         advanced to the the terminal ileum, with                         identification of the appendiceal orifice and IC                         valve. The colonoscopy was performed with moderate                         difficulty due to restricted mobility of the colon.                         Successful completion of the procedure was aided by                         applying abdominal pressure. The patient tolerated the                         procedure well. The quality of the bowel preparation                         was evaluated using the BBPS [facility]  Bowel Preparation                         Scale) with scores of: Right Colon = 3, Transverse                         Colon = 3 and Left Colon = 3 (entire mucosa seen well                         with no residual staining, small fragments of stool or                         opaque liquid). The total BBPS score equals 9. Findings:      The perianal and digital rectal examinations were normal. Pertinent       negatives include normal sphincter tone and no palpable rectal lesions.      A 4 mm polyp was found in the cecum. The polyp was sessile. The polyp       was removed with a cold snare. Resection and retrieval were complete.      A 12 mm polyp was found in the descending colon. The polyp was flat.       Preparations were made for mucosal resection. NBI was done to mark the       borders of the lesion. Saline was injected with adequate lift of the       lesion from the muscularis propria. Snare mucosal resection with suction       (via the working channel) retrieval was performed. A 12 mm area was       resected. Resection and retrieval were complete. There was no bleeding       during and at the end of the procedure.      Non-bleeding external hemorrhoids were found during retroflexion. The  hemorrhoids were medium-sized. Impression:            - One 4 mm polyp in the cecum, removed with a cold                         snare. Resected and retrieved.                        - One 12 mm polyp in the descending colon, removed                         with mucosal resection. Resected and retrieved.                        - Non-bleeding external hemorrhoids.                        - Mucosal resection was performed. Resection and                         retrieval were complete. Recommendation:        - Discharge patient to home (with escort).                        - Resume previous diet today.                        - Continue present medications.                        - Await pathology  results.                        - Repeat colonoscopy in 3 - 5 years for surveillance                         based on pathology results. Procedure Code(s):     --- Professional ---                        [phone], Colonoscopy, flexible; with endoscopic mucosal                         resection                        [id number], 18, Colonoscopy, flexible; with removal of                         tumor(s), polyp(s), or other lesion(s) by snare                         technique Diagnosis Code(s):     --- Professional ---                        Z86.010, Personal history of colonic polyps                        K64.4, Residual hemorrhoidal skin tags                        K63.5,  Polyp of colon CPT copyright 2019 American [employer] Association. All rights reserved. The codes documented in this report are preliminary and upon coder review may  be revised to meet current compliance requirements. Dr. Ulyess Mort Lin Landsman MD, MD 06/17/2021 12:16:07 PM This report has been signed electronically. Number of Addenda: 0 Note Initiated On: 06/17/2021 11:25 AM Scope Withdrawal Time: 0 hours 13 minutes 48 seconds  Total Procedure Duration: 0 hours 18 minutes 40 seconds  Estimated Blood Loss:  Estimated blood loss: none.      [facility]

## 2021-06-17 NOTE — Op Note (Signed)
[facility] Gastroenterology Patient Name: Alexis Reed Procedure Date: 06/17/2021 11:19 AM MRN: [id number] Account #: [id number] Date of Birth: 09-08-1945 Admit Type: Outpatient Age: 75 Room: [facility] ENDO ROOM 1 Gender: Female Note Status: Finalized Instrument Name: Michaelle Birks [id number] Procedure:             Upper GI endoscopy Indications:           Functional Dyspepsia, frequent burping Providers:             Lin Landsman MD, MD Medicines:             General Anesthesia Complications:         No immediate complications. Estimated blood loss: None. Procedure:             Pre-Anesthesia Assessment:                        - Prior to the procedure, a History and Physical was                         performed, and patient medications and allergies were                         reviewed. The patient is competent. The risks and                         benefits of the procedure and the sedation options and                         risks were discussed with the patient. All questions                         were answered and informed consent was obtained.                         Patient identification and proposed procedure were                         verified by the physician, the nurse, the                         anesthesiologist, the anesthetist and the technician                         in the pre-procedure area in the procedure room in the                         endoscopy suite. Mental Status Examination: alert and                         oriented. Airway Examination: normal oropharyngeal                         airway and neck mobility. Respiratory Examination:                         clear to auscultation. CV Examination: normal.                         Prophylactic Antibiotics:  The patient does not require                         prophylactic antibiotics. Prior Anticoagulants: The                         patient has taken no previous anticoagulant or                          antiplatelet agents. ASA Grade Assessment: II - A                         patient with mild systemic disease. After reviewing                         the risks and benefits, the patient was deemed in                         satisfactory condition to undergo the procedure. The                         anesthesia plan was to use general anesthesia.                         Immediately prior to administration of medications,                         the patient was re-assessed for adequacy to receive                         sedatives. The heart rate, respiratory rate, oxygen                         saturations, blood pressure, adequacy of pulmonary                         ventilation, and response to care were monitored                         throughout the procedure. The physical status of the                         patient was re-assessed after the procedure.                        After obtaining informed consent, the endoscope was                         passed under direct vision. Throughout the procedure,                         the patient's blood pressure, pulse, and oxygen                         saturations were monitored continuously. The Endoscope                         was introduced through the mouth, and advanced to the  second part of duodenum. The upper GI endoscopy was                         accomplished without difficulty. The patient tolerated                         the procedure well. Findings:      The second portion of the duodenum was normal.      A single 3 mm sessile polyp with no bleeding was found in the duodenal       bulb. The polyp was removed with a cold biopsy forceps. Resection and       retrieval were complete.      The entire examined stomach was normal. Biopsies were taken with a cold       forceps for Helicobacter pylori testing.      The cardia and gastric fundus were normal on retroflexion.       Esophagogastric landmarks were identified: the gastroesophageal junction       was found at 40 cm from the incisors.      The gastroesophageal junction and examined esophagus were normal.       Biopsies were taken with a cold forceps for histology. Impression:            - Normal second portion of the duodenum.                        - A single duodenal polyp. Resected and retrieved.                        - Normal stomach. Biopsied.                        - Esophagogastric landmarks identified.                        - Normal gastroesophageal junction and esophagus.                         Biopsied. Recommendation:        - Await pathology results.                        - Proceed with colonoscopy as scheduled                        See colonoscopy report Procedure Code(s):     --- Professional ---                        [phone], Esophagogastroduodenoscopy, flexible,                         transoral; with biopsy, single or multiple Diagnosis Code(s):     --- Professional ---                        K30, Functional dyspepsia                        K31.7, Polyp of stomach and duodenum CPT copyright 2019 American Medical Association. All rights reserved. The codes documented in this report are preliminary and upon coder review may  be revised to meet current compliance requirements. Dr. Ulyess Mort Lin Landsman MD, MD 06/17/2021 11:52:00 AM This report has been signed electronically. Number of Addenda: 0 Note Initiated On: 06/17/2021 11:19 AM Estimated Blood Loss:  Estimated blood loss: none.      [facility]

## 2021-06-17 NOTE — Transfer of Care (Signed)
Immediate Anesthesia Transfer of Care Note  Patient: Alexis Reed  Procedure(s) Performed: COLONOSCOPY WITH PROPOFOL ESOPHAGOGASTRODUODENOSCOPY (EGD) WITH PROPOFOL  Patient Location: Endoscopy Unit  Anesthesia Type:General  Level of Consciousness: awake, alert  and oriented  Airway & Oxygen Therapy: Patient Spontanous Breathing  Post-op Assessment: Report given to RN and Post -op Vital signs reviewed and stable  Post vital signs: Reviewed and stable  Last Vitals:  Vitals Value Taken Time  BP 115/62 06/17/21 1217  Temp 35.6 C 06/17/21 1216  Pulse 103 06/17/21 1220  Resp 23 06/17/21 1220  SpO2 97 % 06/17/21 1220  Vitals shown include unvalidated device data.  Last Pain:  Vitals:   06/17/21 1216  TempSrc: Temporal  PainSc:          Complications: No notable events documented.

## 2021-06-17 NOTE — Anesthesia Procedure Notes (Signed)
Date/Time: 06/17/2021 11:33 AM Performed by: Lily Peer, Kerrington Greenhalgh, CRNA Pre-anesthesia Checklist: Patient identified, Emergency Drugs available, Suction available, Patient being monitored and Timeout performed Patient Re-evaluated:Patient Re-evaluated prior to induction Oxygen Delivery Method: Simple face mask Induction Type: IV induction

## 2021-06-17 NOTE — H&P (Signed)
Cephas Darby, MD [address]  [place]  [place], [place] [address]  Main: [phone]  Fax: [phone] Pager: [phone]  Primary Care Physician:  Einar Pheasant, MD Primary Gastroenterologist:  Dr. Cephas Darby  Pre-Procedure History & Physical: HPI:  Alexis Reed is a 75 y.o. female is here for an endoscopy and colonoscopy.   Past Medical History:  Diagnosis Date   Arthritis    Benign breast lumps    multiple lumps biopsy and remove x's 5 last being 1979 by Dr. Sharlet Salina   COPD (chronic obstructive pulmonary disease) ([place])    MILD-RARELY EVER HAS TO USE COMBIVENT INHALER   Fibroids    s/p hysterectomy   GERD (gastroesophageal reflux disease)    Hypercholesterolemia    Osteoporosis    Personal history of tobacco use, presenting hazards to health 08/07/2015   Urinary tract infection     Past Surgical History:  Procedure Laterality Date   ABDOMINAL HYSTERECTOMY  1997   with bilateral oophorectomy   BREAST BIOPSY Left    multiple done   BREAST BIOPSY Right 2016   stereo- neg. FC changes   BREAST EXCISIONAL BIOPSY Right 1975   multiple biopsies done   BREAST SURGERY Left    lump removed. Unsure of date   BREAST SURGERY Right    lump removed. Unsure of date   COLONOSCOPY  2010   Dr. Vira Agar   KNEE ARTHROSCOPY WITH LATERAL MENISECTOMY Right 05/08/2020   Procedure: RIGHT KNEE ARTHROSCOPY WITH DEBRIDEMENT AND PARTIAL LATERAL MENISCECTOMY;  Surgeon: Corky Mull, MD;  Location: [facility] ORS;  Service: Orthopedics;  Laterality: Right;   LAPAROSCOPIC APPENDECTOMY N/A 12/21/2018   Procedure: APPENDECTOMY LAPAROSCOPIC CONVERTED TO OPEN;  Surgeon: Benjamine Sprague, DO;  Location: [facility] OR[facility];  Service: General;  Laterality: N/A;   TONSILLECTOMY      Prior to Admission medications   Medication Sig Start Date End Date Taking? Authorizing Provider  acetaminophen (TYLENOL) 500 MG tablet Take 1,000 mg by mouth every 6 (six) hours as needed for moderate pain.   Yes  [provider]  Calcium Carb-Cholecalciferol (CALCIUM 500 +D) 500-400 MG-UNIT TABS Take 1 tablet by mouth daily.   Yes [provider]  Cholecalciferol (VITAMIN D) 2000 UNITS tablet Take 2,000 Units by mouth daily.   Yes [provider]  denosumab (PROLIA) 60 MG/ML SOSY injection Inject 60 mg into the skin every 6 (six) months.    Yes [provider]  denosumab (PROLIA) 60 MG/ML SOSY injection Inject into the skin. 12/25/20  Yes [provider]  fluticasone-salmeterol (ADVAIR HFA) 230-21 MCG/ACT inhaler Inhale 2 puffs into the lungs 2 (two) times daily. 11/14/20  Yes Flora Lipps, MD  ibuprofen (ADVIL) 800 MG tablet Take 1 tablet (800 mg total) by mouth every 8 (eight) hours as needed for mild pain or moderate pain. 12/22/18  Yes Sakai, Isami, DO  Ipratropium-Albuterol (COMBIVENT RESPIMAT) 20-100 MCG/ACT AERS respimat Inhale 1 puff into the lungs every 6 (six) hours as needed for wheezing. 11/14/20  Yes Flora Lipps, MD  nitrofurantoin (MACRODANTIN) 50 MG capsule Take 1 capsule (50 mg total) by mouth daily. 08/07/20  Yes Billey Co, MD  omeprazole (PRILOSEC) 20 MG capsule Take 1 capsule (20 mg total) by mouth daily. 08/30/20  Yes Einar Pheasant, MD  pravastatin (PRAVACHOL) 40 MG tablet Take 1 tablet (40 mg total) by mouth daily. 05/20/21  Yes Einar Pheasant, MD  Probiotic Product (PROBIOTIC DAILY PO) Take 1 capsule by mouth daily.  Yes [provider]    Allergies as of 06/14/2021 - Review Complete 06/14/2021  Allergen Reaction Noted   Benzoin compound Other (See Comments) 05/07/2020   Boniva [ibandronic acid] Other (See Comments) 06/18/2012   Codeine Nausea Only 06/18/2012   Fosamax [alendronate sodium]  06/18/2012   Lipitor [atorvastatin] Hives 10/17/2014   Meloxicam Other (See Comments) 04/30/2020   Penicillins Swelling 06/18/2012    Family History  Problem Relation Age of Onset   Hyperlipidemia Mother    Thyroid disease Mother     Hypertension Mother    Colon cancer Father    Stroke Father    Heart disease Father        myocardial infarction   Breast cancer Neg Hx     Social History   Socioeconomic History   Marital status: Married    Spouse name: Not on file   Number of children: 2   Years of education: Not on file   Highest education level: Not on file  Occupational History   Not on file  Tobacco Use   Smoking status: Former    Packs/day: 1.00    Years: 30.00    Pack years: 30.00    Types: Cigarettes    Quit date: 04/26/2004    Years since quitting: 17.1   Smokeless tobacco: Never  Vaping Use   Vaping Use: Never used  Substance and Sexual Activity   Alcohol use: Yes    Alcohol/week: 0.0 standard drinks    Comment: occassional wine every other day   Drug use: No   Sexual activity: Yes    Birth control/protection: Post-menopausal  Other Topics Concern   Not on file  Social History Narrative   She is married, has two children. Works at [employer] Strain: Not on file  Food Insecurity: Not on file  Transportation Needs: Not on file  Physical Activity: Not on file  Stress: Not on file  Social Connections: Not on file  Intimate Partner Violence: Not on file    Review of Systems: See HPI, otherwise negative ROS  Physical Exam: BP (!) 157/82   Pulse 86   Temp (!) 97.4 F (36.3 C) (Temporal)   Resp 20   Ht 5\' 3"  (1.6 m)   Wt 86.2 kg   LMP 07/20/1995   SpO2 97%   BMI 33.66 kg/m  General:   Alert,  pleasant and cooperative in NAD Head:  Normocephalic and atraumatic. Neck:  Supple; no masses or thyromegaly. Lungs:  Clear throughout to auscultation.    Heart:  Regular rate and rhythm. Abdomen:  Soft, nontender and nondistended. Normal bowel sounds, without guarding, and without rebound.   Neurologic:  Alert and  oriented x4;  grossly normal neurologically.  Impression/Plan: Alexis Reed is here for  an endoscopy and colonoscopy to be performed for frequent burping, LLQ pain, h/o colon polyps  Risks, benefits, limitations, and alternatives regarding  endoscopy and colonoscopy have been reviewed with the patient.  Questions have been answered.  All parties agreeable.   Sherri Sear, MD  06/17/2021, 10:59 AM

## 2021-06-18 ENCOUNTER — Encounter: Payer: Self-pay | Admitting: Gastroenterology

## 2021-06-18 LAB — SURGICAL PATHOLOGY

## 2021-06-18 NOTE — Anesthesia Postprocedure Evaluation (Signed)
Anesthesia Post Note  Patient: LUJUANA KAPLER  Procedure(s) Performed: COLONOSCOPY WITH PROPOFOL ESOPHAGOGASTRODUODENOSCOPY (EGD) WITH PROPOFOL  Patient location during evaluation: PACU Anesthesia Type: General Level of consciousness: awake and alert Pain management: pain level controlled Vital Signs Assessment: post-procedure vital signs reviewed and stable Respiratory status: spontaneous breathing, nonlabored ventilation, respiratory function stable and patient connected to nasal cannula oxygen Cardiovascular status: blood pressure returned to baseline and stable Postop Assessment: no apparent nausea or vomiting Anesthetic complications: no   No notable events documented.   Last Vitals:  Vitals:   06/17/21 1226 06/17/21 1236  BP: (!) 165/76 (!) 144/73  Pulse: (!) 108 95  Resp: 19 16  Temp:    SpO2: 96% 100%    Last Pain:  Vitals:   06/17/21 1236  TempSrc:   PainSc: 0-No pain                 Molli Barrows

## 2021-06-18 NOTE — Anesthesia Preprocedure Evaluation (Signed)
Anesthesia Evaluation  Patient identified by MRN, date of birth, ID band Patient awake    Reviewed: Allergy & Precautions, H&P , NPO status , Patient's Chart, lab work & pertinent test results, reviewed documented beta blocker date and time   Airway Mallampati: II   Neck ROM: full    Dental  (+) Poor Dentition   Pulmonary COPD, former smoker,    Pulmonary exam normal        Cardiovascular negative cardio ROS Normal cardiovascular exam Rhythm:regular Rate:Normal     Neuro/Psych negative neurological ROS  negative psych ROS   GI/Hepatic Neg liver ROS, GERD  Medicated,  Endo/Other  negative endocrine ROS  Renal/GU negative Renal ROS  negative genitourinary   Musculoskeletal   Abdominal   Peds  Hematology negative hematology ROS (+)   Anesthesia Other Findings Past Medical History: No date: Arthritis No date: Benign breast lumps     Comment:  multiple lumps biopsy and remove x's 5 last being 1979               by Dr. Sharlet Salina No date: COPD (chronic obstructive pulmonary disease) ([place])     Comment:  MILD-RARELY EVER HAS TO USE COMBIVENT INHALER No date: Fibroids     Comment:  s/p hysterectomy No date: GERD (gastroesophageal reflux disease) No date: Hypercholesterolemia No date: Osteoporosis 08/07/2015: Personal history of tobacco use, presenting hazards to  health No date: Urinary tract infection Past Surgical History: 1997: ABDOMINAL HYSTERECTOMY     Comment:  with bilateral oophorectomy No date: BREAST BIOPSY; Left     Comment:  multiple done 2016: BREAST BIOPSY; Right     Comment:  stereo- neg. FC changes 1975: BREAST EXCISIONAL BIOPSY; Right     Comment:  multiple biopsies done No date: BREAST SURGERY; Left     Comment:  lump removed. Unsure of date No date: BREAST SURGERY; Right     Comment:  lump removed. Unsure of date 2010: COLONOSCOPY     Comment:  Dr. Vira Agar 06/17/2021: COLONOSCOPY WITH  PROPOFOL; N/A     Comment:  Procedure: COLONOSCOPY WITH PROPOFOL;  Surgeon: Lin Landsman, MD;  Location: [facility] ENDOSCOPY;  Service:               Gastroenterology;  Laterality: N/A; 06/17/2021: ESOPHAGOGASTRODUODENOSCOPY (EGD) WITH PROPOFOL     Comment:  Procedure: ESOPHAGOGASTRODUODENOSCOPY (EGD) WITH               PROPOFOL;  Surgeon: Lin Landsman, MD;  Location:               [facility] ENDOSCOPY;  Service: Gastroenterology;; 05/08/2020: KNEE ARTHROSCOPY WITH LATERAL MENISECTOMY; Right     Comment:  Procedure: RIGHT KNEE ARTHROSCOPY WITH DEBRIDEMENT AND               PARTIAL LATERAL MENISCECTOMY;  Surgeon: Corky Mull,               MD;  Location: [facility] ORS;  Service: Orthopedics;                Laterality: Right; 12/21/2018: LAPAROSCOPIC APPENDECTOMY; N/A     Comment:  Procedure: APPENDECTOMY LAPAROSCOPIC CONVERTED TO OPEN;               Surgeon: Benjamine Sprague, DO;  Location: [facility] ORS;  Service:              General;  Laterality: N/A; No  date: TONSILLECTOMY BMI    Body Mass Index: 33.66 kg/m     Reproductive/Obstetrics negative OB ROS                             Anesthesia Physical Anesthesia Plan  ASA: 3  Anesthesia Plan: General   Post-op Pain Management:    Induction:   PONV Risk Score and Plan:   Airway Management Planned:   Additional Equipment:   Intra-op Plan:   Post-operative Plan:   Informed Consent: I have reviewed the patients History and Physical, chart, labs and discussed the procedure including the risks, benefits and alternatives for the proposed anesthesia with the patient or authorized representative who has indicated his/her understanding and acceptance.     Dental Advisory Given  Plan Discussed with: CRNA  Anesthesia Plan Comments:         Anesthesia Quick Evaluation

## 2021-07-08 NOTE — Addendum Note (Signed)
Encounter addended by: Annie Paras on: 07/08/2021 10:31 AM  Actions taken: Letter saved

## 2021-07-14 DIAGNOSIS — I639 Cerebral infarction, unspecified: Secondary | ICD-10-CM

## 2021-07-14 HISTORY — DX: Cerebral infarction, unspecified: I63.9

## 2021-07-16 ENCOUNTER — Encounter: Payer: Self-pay | Admitting: Internal Medicine

## 2021-07-23 ENCOUNTER — Ambulatory Visit (INDEPENDENT_AMBULATORY_CARE_PROVIDER_SITE_OTHER): Payer: Medicare HMO | Admitting: Internal Medicine

## 2021-07-23 ENCOUNTER — Other Ambulatory Visit: Payer: Self-pay

## 2021-07-23 VITALS — BP 128/76 | HR 87 | Temp 97.9°F | Resp 16 | Ht 63.0 in | Wt 192.0 lb

## 2021-07-23 DIAGNOSIS — E041 Nontoxic single thyroid nodule: Secondary | ICD-10-CM | POA: Diagnosis not present

## 2021-07-23 DIAGNOSIS — M810 Age-related osteoporosis without current pathological fracture: Secondary | ICD-10-CM

## 2021-07-23 DIAGNOSIS — E559 Vitamin D deficiency, unspecified: Secondary | ICD-10-CM

## 2021-07-23 DIAGNOSIS — R0602 Shortness of breath: Secondary | ICD-10-CM

## 2021-07-23 DIAGNOSIS — K219 Gastro-esophageal reflux disease without esophagitis: Secondary | ICD-10-CM

## 2021-07-23 DIAGNOSIS — F439 Reaction to severe stress, unspecified: Secondary | ICD-10-CM

## 2021-07-23 DIAGNOSIS — J449 Chronic obstructive pulmonary disease, unspecified: Secondary | ICD-10-CM

## 2021-07-23 DIAGNOSIS — E7800 Pure hypercholesterolemia, unspecified: Secondary | ICD-10-CM

## 2021-07-23 DIAGNOSIS — Z86010 Personal history of colonic polyps: Secondary | ICD-10-CM

## 2021-07-23 DIAGNOSIS — Z01818 Encounter for other preprocedural examination: Secondary | ICD-10-CM

## 2021-07-23 DIAGNOSIS — R918 Other nonspecific abnormal finding of lung field: Secondary | ICD-10-CM

## 2021-07-23 LAB — COMPREHENSIVE METABOLIC PANEL
ALT: 14 U/L (ref 0–35)
AST: 14 U/L (ref 0–37)
Albumin: 4.3 g/dL (ref 3.5–5.2)
Alkaline Phosphatase: 83 U/L (ref 39–117)
BUN: 18 mg/dL (ref 6–23)
CO2: 27 mEq/L (ref 19–32)
Calcium: 10.4 mg/dL (ref 8.4–10.5)
Chloride: 101 mEq/L (ref 96–112)
Creatinine, Ser: 0.68 mg/dL (ref 0.40–1.20)
GFR: 86.42 mL/min (ref >60.00)
Glucose, Bld: 93 mg/dL (ref 70–99)
Potassium: 3.9 mEq/L (ref 3.5–5.1)
Sodium: 137 mEq/L (ref 135–145)
Total Bilirubin: 0.6 mg/dL (ref 0.2–1.2)
Total Protein: 7.1 g/dL (ref 6.0–8.3)

## 2021-07-23 LAB — URINALYSIS, ROUTINE W REFLEX MICROSCOPIC
Bilirubin Urine: NEGATIVE
Hgb urine dipstick: NEGATIVE
Ketones, ur: NEGATIVE
Leukocytes,Ua: NEGATIVE
Nitrite: NEGATIVE
RBC / HPF: NONE SEEN (ref 0–?)
Specific Gravity, Urine: >=1.03 — AB (ref 1.000–1.030)
Total Protein, Urine: NEGATIVE
Urine Glucose: NEGATIVE
Urobilinogen, UA: 0.2 (ref 0.0–1.0)
pH: 5.5 (ref 5.0–8.0)

## 2021-07-23 LAB — CBC WITH DIFFERENTIAL/PLATELET
Basophils Absolute: 0 10*3/uL (ref 0.0–0.1)
Basophils Relative: 0.7 % (ref 0.0–3.0)
Eosinophils Absolute: 0.1 10*3/uL (ref 0.0–0.7)
Eosinophils Relative: 1.7 % (ref 0.0–5.0)
HCT: 43.1 % (ref 36.0–46.0)
Hemoglobin: 14 g/dL (ref 12.0–15.0)
Lymphocytes Relative: 25.5 % (ref 12.0–46.0)
Lymphs Abs: 1.2 10*3/uL (ref 0.7–4.0)
MCHC: 32.4 g/dL (ref 30.0–36.0)
MCV: 92.6 fl (ref 78.0–100.0)
Monocytes Absolute: 0.5 10*3/uL (ref 0.1–1.0)
Monocytes Relative: 9.7 % (ref 3.0–12.0)
Neutro Abs: 2.9 10*3/uL (ref 1.4–7.7)
Neutrophils Relative %: 62.4 % (ref 43.0–77.0)
Platelets: 301 10*3/uL (ref 150.0–400.0)
RBC: 4.65 Mil/uL (ref 3.87–5.11)
RDW: 13.6 % (ref 11.5–15.5)
WBC: 4.7 10*3/uL (ref 4.0–10.5)

## 2021-07-23 LAB — PROTIME-INR
INR: 0.9 ratio (ref 0.8–1.0)
Prothrombin Time: 10.3 s (ref 9.6–13.1)

## 2021-07-23 LAB — HEMOGLOBIN A1C: Hgb A1c MFr Bld: 5.6 % (ref 4.6–6.5)

## 2021-07-23 NOTE — Progress Notes (Signed)
Patient ID: Alexis Reed, female   DOB: [birth year], 76 y.o.   MRN: [id number]   Subjective:    Patient ID: Alexis Reed, female    DOB: 07-30-1945, 76 y.o.   MRN: [id number]  This visit occurred during the SARS-CoV-2 public health emergency.  Safety protocols were in place, including screening questions prior to the visit, additional usage of staff PPE, and extensive cleaning of exam room while observing appropriate contact time as indicated for disinfecting solutions.   Patient here for follow up appt and pre op evaluation.    Chief Complaint  Patient presents with   Pre-op Exam   .   HPI Has a history of COPD, cholesterol and reflux.  Here to follow up on these issues.  Recently evaluated by cardiology for sob and lower extremity edema.  Had recent echo - normal.  Cardiology recommend no further w/up prior to planned surgery.  See cardiology note.  She comes in today stating that symptoms are stable.  No chest pain.  Some sob with increased exertion, but reports breathing is stable.  No increased cough or congestion.  No acid reflux - controlled with prilosec.  No abdominal pain.  Bowels moving.  Not using inhaler.  Not using advair and has not needed albuterol.  Receiving prolia injections.  Due to get a dose tomorrow at endocrinology.  After this, prefers to start prolia injections here.     Past Medical History:  Diagnosis Date   Arthritis    Benign breast lumps    multiple lumps biopsy and remove x's 5 last being 1979 by Dr. Sharlet Salina   COPD (chronic obstructive pulmonary disease) ([place])    MILD-RARELY EVER HAS TO USE COMBIVENT INHALER   Fibroids    s/p hysterectomy   GERD (gastroesophageal reflux disease)    Hypercholesterolemia    Osteoporosis    Personal history of tobacco use, presenting hazards to health 08/07/2015   Urinary tract infection    Past Surgical History:  Procedure Laterality Date   ABDOMINAL HYSTERECTOMY  1997   with bilateral oophorectomy   BREAST  BIOPSY Left    multiple done   BREAST BIOPSY Right 2016   stereo- neg. FC changes   BREAST EXCISIONAL BIOPSY Right 1975   multiple biopsies done   BREAST SURGERY Left    lump removed. Unsure of date   BREAST SURGERY Right    lump removed. Unsure of date   COLONOSCOPY  2010   Dr. Vira Agar   COLONOSCOPY WITH PROPOFOL N/A 06/17/2021   Procedure: COLONOSCOPY WITH PROPOFOL;  Surgeon: Lin Landsman, MD;  Location: [facility] ENDOSCOPY;  Service: Gastroenterology;  Laterality: N/A;   ESOPHAGOGASTRODUODENOSCOPY (EGD) WITH PROPOFOL  06/17/2021   Procedure: ESOPHAGOGASTRODUODENOSCOPY (EGD) WITH PROPOFOL;  Surgeon: Lin Landsman, MD;  Location: [facility] ENDOSCOPY;  Service: Gastroenterology;;   KNEE ARTHROSCOPY WITH LATERAL MENISECTOMY Right 05/08/2020   Procedure: RIGHT KNEE ARTHROSCOPY WITH DEBRIDEMENT AND PARTIAL LATERAL MENISCECTOMY;  Surgeon: Corky Mull, MD;  Location: [facility] ORS;  Service: Orthopedics;  Laterality: Right;   LAPAROSCOPIC APPENDECTOMY N/A 12/21/2018   Procedure: APPENDECTOMY LAPAROSCOPIC CONVERTED TO OPEN;  Surgeon: Benjamine Sprague, DO;  Location: [facility] ORS;  Service: General;  Laterality: N/A;   TONSILLECTOMY     Family History  Problem Relation Age of Onset   Hyperlipidemia Mother    Thyroid disease Mother    Hypertension Mother    Colon cancer Father    Stroke Father    Heart disease Father  myocardial infarction   Breast cancer Neg Hx    Social History   Socioeconomic History   Marital status: Married    Spouse name: Not on file   Number of children: 2   Years of education: Not on file   Highest education level: Not on file  Occupational History   Not on file  Tobacco Use   Smoking status: Former    Packs/day: 1.00    Years: 30.00    Pack years: 30.00    Types: Cigarettes    Quit date: 04/26/2004    Years since quitting: 17.2   Smokeless tobacco: Never  Vaping Use   Vaping Use: Never used  Substance and Sexual Activity   Alcohol use: Yes     Alcohol/week: 0.0 standard drinks    Comment: occassional wine every other day   Drug use: No   Sexual activity: Yes    Birth control/protection: Post-menopausal  Other Topics Concern   Not on file  Social History Narrative   She is married, has two children. Works at [employer] Strain: Not on file  Food Insecurity: Not on file  Transportation Needs: Not on file  Physical Activity: Not on file  Stress: Not on file  Social Connections: Not on file     Review of Systems  Constitutional:  Negative for appetite change and unexpected weight change.  HENT:  Negative for congestion and sinus pressure.   Respiratory:  Negative for cough and chest tightness.        Breathing stable as outlined.    Cardiovascular:  Negative for chest pain, palpitations and leg swelling.  Gastrointestinal:  Negative for abdominal pain, diarrhea, nausea and vomiting.  Genitourinary:  Negative for difficulty urinating and dysuria.  Musculoskeletal:  Negative for joint swelling and myalgias.  Skin:  Negative for color change and rash.  Neurological:  Negative for dizziness, light-headedness and headaches.  Psychiatric/Behavioral:  Negative for agitation and dysphoric mood.       Objective:     BP 128/76    Pulse 87    Temp 97.9 F (36.6 C)    Resp 16    Ht '5\' 3"'  (1.6 m)    Wt 192 lb (87.1 kg)    LMP 07/20/1995    SpO2 98%    BMI 34.01 kg/m  Wt Readings from Last 3 Encounters:  07/23/21 192 lb (87.1 kg)  06/17/21 190 lb (86.2 kg)  06/14/21 194 lb 6.4 oz (88.2 kg)    Physical Exam Vitals reviewed.  Constitutional:      General: She is not in acute distress.    Appearance: Normal appearance.  HENT:     Head: Normocephalic and atraumatic.     Right Ear: External ear normal.     Left Ear: External ear normal.  Eyes:     General: No scleral icterus.       Right eye: No discharge.        Left eye: No discharge.      Conjunctiva/sclera: Conjunctivae normal.  Neck:     Thyroid: No thyromegaly.  Cardiovascular:     Rate and Rhythm: Normal rate and regular rhythm.  Pulmonary:     Effort: No respiratory distress.     Breath sounds: Normal breath sounds. No wheezing.  Abdominal:     General: Bowel sounds are normal.     Palpations: Abdomen is soft.     Tenderness: There  is no abdominal tenderness.  Musculoskeletal:        General: No swelling or tenderness.     Cervical back: Neck supple. No tenderness.  Lymphadenopathy:     Cervical: No cervical adenopathy.  Skin:    Findings: No erythema or rash.  Neurological:     Mental Status: She is alert.  Psychiatric:        Mood and Affect: Mood normal.        Behavior: Behavior normal.     Outpatient Encounter Medications as of 07/23/2021  Medication Sig   acetaminophen (TYLENOL) 500 MG tablet Take 1,000 mg by mouth every 6 (six) hours as needed for moderate pain.   Calcium Carb-Cholecalciferol (CALCIUM 500 +D) 500-400 MG-UNIT TABS Take 1 tablet by mouth daily.   Cholecalciferol (VITAMIN D) 2000 UNITS tablet Take 2,000 Units by mouth daily.   denosumab (PROLIA) 60 MG/ML SOSY injection Inject 60 mg into the skin every 6 (six) months.    denosumab (PROLIA) 60 MG/ML SOSY injection Inject into the skin.   fluticasone-salmeterol (ADVAIR HFA) 230-21 MCG/ACT inhaler Inhale 2 puffs into the lungs 2 (two) times daily.   ibuprofen (ADVIL) 800 MG tablet Take 1 tablet (800 mg total) by mouth every 8 (eight) hours as needed for mild pain or moderate pain.   Ipratropium-Albuterol (COMBIVENT RESPIMAT) 20-100 MCG/ACT AERS respimat Inhale 1 puff into the lungs every 6 (six) hours as needed for wheezing.   nitrofurantoin (MACRODANTIN) 50 MG capsule Take 1 capsule (50 mg total) by mouth daily.   omeprazole (PRILOSEC) 20 MG capsule Take 1 capsule (20 mg total) by mouth daily.   pravastatin (PRAVACHOL) 40 MG tablet Take 1 tablet (40 mg total) by mouth daily.   Probiotic  Product (PROBIOTIC DAILY PO) Take 1 capsule by mouth daily.   No facility-administered encounter medications on file as of 07/23/2021.     Lab Results  Component Value Date   WBC 4.7 07/23/2021   HGB 14.0 07/23/2021   HCT 43.1 07/23/2021   PLT 301.0 07/23/2021   GLUCOSE 93 07/23/2021   CHOL 188 05/08/2021   TRIG 103.0 05/08/2021   HDL 57.40 05/08/2021   LDLDIRECT 153.0 07/17/2015   LDLCALC 110 (H) 05/08/2021   ALT 14 07/23/2021   AST 14 07/23/2021   NA 137 07/23/2021   K 3.9 07/23/2021   CL 101 07/23/2021   CREATININE 0.68 07/23/2021   BUN 18 07/23/2021   CO2 27 07/23/2021   TSH 1.75 05/08/2021   INR 0.9 07/23/2021   HGBA1C 5.6 07/23/2021    No results found.     Assessment & Plan:   Problem List Items Addressed This Visit     COPD (chronic obstructive pulmonary disease) ([place])    Has seen Dr Mortimer Fries.  Not taking advair regularly.  Has not felt needed rescue inhaler.  Will restart advair.  Discussed taking the day of procedure.  SOB with exertion as outlined, but feels breathing is stable.   No increased cough or congestion.  Saw cardiology.  ECHO - ok.  No further cardiac w/up warranted.  Discussed contacting Dr Mortimer Fries - confirm no further intervention warranted.        GERD (gastroesophageal reflux disease)    Upper symptoms controlled on current PPI.        History of colonic polyps    Colonoscopy 06/2021.  Recommended f/u in 3 years.       Hypercholesterolemia    On pravastatin.  Low cholesterol diet and exercise. Follow lipid  panel and liver function tests.        Lung nodules    Seeing Dr Mortimer Fries.  CT 11/2020 - lung nodules.  Recommended f/u CT chest in 6 months.  Discussed.  Overdue f/u with Dr Mortimer Fries.  She will call for f/u.        Osteoporosis    Receiving prolia.  Due to receive injection tomorrow through endocrinology.  Wants to start getting prolia injections at our office.  Follow       Pre-op evaluation    Saw cardiology recently.  ECHO - ok.  No further  cardiac w/up warranted.  See note for clearance.  She feels her breathing is stable.  Will start advair bid.  Instructed to use the day of procedure.  Will need close intra op and post op monitoring of heart rate and blood pressure to avoid extremes.  She will call Dr Mortimer Fries as discussed. Pre op labs ordered.       SOB (shortness of breath)     SOB with exertion as outlined, but feels breathing is stable.   No increased cough or congestion.  Saw cardiology.  ECHO - ok.  No further cardiac w/up warranted.  Discussed contacting Dr Mortimer Fries - confirm no further intervention warranted.  Will restart advair - take bid.  Instructed to take on day of procedure.  Discussed f/u with Dr Mortimer Fries.        Stress    Overall appears to be handling things well.  Follow.       Thyroid nodule    Saw endocrinology.  Recommended f/u ultrasound and TSH in 12 months.        Vitamin D deficiency    Follow vitamin D level.       Other Visit Diagnoses     Pre-op exam    -  Primary   Relevant Orders   CBC with Differential/Platelet (Completed)   Protime-INR (Completed)   Hemoglobin A1c (Completed)   Urinalysis, Routine w reflex microscopic (Completed)   Comp Met (CMET) (Completed)        Einar Pheasant, MD

## 2021-07-24 ENCOUNTER — Encounter: Payer: Self-pay | Admitting: Internal Medicine

## 2021-07-24 ENCOUNTER — Telehealth: Payer: Self-pay | Admitting: Internal Medicine

## 2021-07-24 DIAGNOSIS — R911 Solitary pulmonary nodule: Secondary | ICD-10-CM | POA: Insufficient documentation

## 2021-07-24 DIAGNOSIS — Z01818 Encounter for other preprocedural examination: Secondary | ICD-10-CM | POA: Insufficient documentation

## 2021-07-24 DIAGNOSIS — R918 Other nonspecific abnormal finding of lung field: Secondary | ICD-10-CM | POA: Insufficient documentation

## 2021-07-24 NOTE — Assessment & Plan Note (Signed)
Saw endocrinology.  Recommended f/u ultrasound and TSH in 12 months.   

## 2021-07-24 NOTE — Assessment & Plan Note (Signed)
SOB with exertion as outlined, but feels breathing is stable.   No increased cough or congestion.  Saw cardiology.  ECHO - ok.  No further cardiac w/up warranted.  Discussed contacting Dr Mortimer Fries - confirm no further intervention warranted.  Will restart advair - take bid.  Instructed to take on day of procedure.  Discussed f/u with Dr Mortimer Fries.

## 2021-07-24 NOTE — Assessment & Plan Note (Signed)
Receiving prolia.  Due to receive injection tomorrow through endocrinology.  Wants to start getting prolia injections at our office.  Follow

## 2021-07-24 NOTE — Assessment & Plan Note (Signed)
Colonoscopy 06/2021.  Recommended f/u in 3 years.  

## 2021-07-24 NOTE — Assessment & Plan Note (Signed)
Upper symptoms controlled on current PPI.   

## 2021-07-24 NOTE — Assessment & Plan Note (Addendum)
Saw cardiology recently.  ECHO - ok.  No further cardiac w/up warranted.  See note for clearance.  She feels her breathing is stable.  Will start advair bid.  Instructed to use the day of procedure.  Will need close intra op and post op monitoring of heart rate and blood pressure to avoid extremes.  She will call Dr Mortimer Fries as discussed. Pre op labs ordered.

## 2021-07-24 NOTE — Assessment & Plan Note (Signed)
Has seen Dr Mortimer Fries.  Not taking advair regularly.  Has not felt needed rescue inhaler.  Will restart advair.  Discussed taking the day of procedure.  SOB with exertion as outlined, but feels breathing is stable.   No increased cough or congestion.  Saw cardiology.  ECHO - ok.  No further cardiac w/up warranted.  Discussed contacting Dr Mortimer Fries - confirm no further intervention warranted.

## 2021-07-24 NOTE — Assessment & Plan Note (Signed)
On pravastatin.  Low cholesterol diet and exercise.  Follow lipid panel and liver function tests.   

## 2021-07-24 NOTE — Assessment & Plan Note (Signed)
Overall appears to be handling things well.  Follow.  ?

## 2021-07-24 NOTE — Assessment & Plan Note (Signed)
Seeing Dr Mortimer Fries.  CT 11/2020 - lung nodules.  Recommended f/u CT chest in 6 months.  Discussed.  Overdue f/u with Dr Mortimer Fries.  She will call for f/u.

## 2021-07-24 NOTE — Telephone Encounter (Signed)
Spoke to Dr. Nicki Reaper.  She wanted to make Dr. Mortimer Fries aware that patient is scheduled for knee surgery 07/31/2021. Patient has been cleared by cardiology.  Dr. Nicki Reaper advised patient to resume adviar. Patient has not had a need for rescue inhaler. SOB is stable. No cough, congestion, fever, chills or sweats. Last CT and OV 11/2020. Past due for ROV-instructed at last OV to return around 05/2021.  Spoke to patient and scheduled appt 09/10/2021 at 11:00. Patient requested appt at the end of February.  Dr. Mortimer Fries, please advise. Does patient need repeat CT?

## 2021-07-24 NOTE — Assessment & Plan Note (Signed)
Follow vitamin D level.  

## 2021-07-25 NOTE — Telephone Encounter (Signed)
Patient is aware that a CT is not needed at this. She voiced her understanding and had no further questions.  Nothing further needed.

## 2021-07-25 NOTE — Telephone Encounter (Signed)
Lm for Dr. Nicki Reaper.

## 2021-07-25 NOTE — Telephone Encounter (Signed)
Dr. Nicki Reaper is aware of below message and voiced her understanding. Lm for patient to make her aware.

## 2021-08-01 ENCOUNTER — Emergency Department: Payer: Medicare HMO

## 2021-08-01 ENCOUNTER — Inpatient Hospital Stay
Admission: EM | Admit: 2021-08-01 | Discharge: 2021-08-03 | DRG: 065 | Disposition: A | Discharge status: Home Health | Payer: Medicare HMO | Attending: Hospitalist | Admitting: Hospitalist

## 2021-08-01 ENCOUNTER — Other Ambulatory Visit: Payer: Self-pay

## 2021-08-01 DIAGNOSIS — Z823 Family history of stroke: Secondary | ICD-10-CM | POA: Diagnosis not present

## 2021-08-01 DIAGNOSIS — J449 Chronic obstructive pulmonary disease, unspecified: Secondary | ICD-10-CM | POA: Diagnosis present

## 2021-08-01 DIAGNOSIS — Z7951 Long term (current) use of inhaled steroids: Secondary | ICD-10-CM | POA: Diagnosis not present

## 2021-08-01 DIAGNOSIS — Z96651 Presence of right artificial knee joint: Secondary | ICD-10-CM | POA: Diagnosis present

## 2021-08-01 DIAGNOSIS — K219 Gastro-esophageal reflux disease without esophagitis: Secondary | ICD-10-CM | POA: Diagnosis present

## 2021-08-01 DIAGNOSIS — R609 Edema, unspecified: Secondary | ICD-10-CM

## 2021-08-01 DIAGNOSIS — Z83438 Family history of other disorder of lipoprotein metabolism and other lipidemia: Secondary | ICD-10-CM

## 2021-08-01 DIAGNOSIS — R29706 NIHSS score 6: Secondary | ICD-10-CM | POA: Diagnosis present

## 2021-08-01 DIAGNOSIS — Z885 Allergy status to narcotic agent status: Secondary | ICD-10-CM | POA: Diagnosis not present

## 2021-08-01 DIAGNOSIS — I639 Cerebral infarction, unspecified: Secondary | ICD-10-CM | POA: Diagnosis not present

## 2021-08-01 DIAGNOSIS — R410 Disorientation, unspecified: Secondary | ICD-10-CM

## 2021-08-01 DIAGNOSIS — Z886 Allergy status to analgesic agent status: Secondary | ICD-10-CM

## 2021-08-01 DIAGNOSIS — E871 Hypo-osmolality and hyponatremia: Secondary | ICD-10-CM | POA: Diagnosis present

## 2021-08-01 DIAGNOSIS — D500 Iron deficiency anemia secondary to blood loss (chronic): Secondary | ICD-10-CM | POA: Diagnosis present

## 2021-08-01 DIAGNOSIS — R300 Dysuria: Secondary | ICD-10-CM | POA: Diagnosis present

## 2021-08-01 DIAGNOSIS — R4182 Altered mental status, unspecified: Secondary | ICD-10-CM | POA: Diagnosis not present

## 2021-08-01 DIAGNOSIS — Z880 Allergy status to penicillin: Secondary | ICD-10-CM

## 2021-08-01 DIAGNOSIS — Z79899 Other long term (current) drug therapy: Secondary | ICD-10-CM

## 2021-08-01 DIAGNOSIS — E7800 Pure hypercholesterolemia, unspecified: Secondary | ICD-10-CM | POA: Diagnosis present

## 2021-08-01 DIAGNOSIS — R600 Localized edema: Secondary | ICD-10-CM | POA: Diagnosis present

## 2021-08-01 DIAGNOSIS — E860 Dehydration: Secondary | ICD-10-CM | POA: Diagnosis present

## 2021-08-01 DIAGNOSIS — M810 Age-related osteoporosis without current pathological fracture: Secondary | ICD-10-CM | POA: Diagnosis present

## 2021-08-01 DIAGNOSIS — I63529 Cerebral infarction due to unspecified occlusion or stenosis of unspecified anterior cerebral artery: Secondary | ICD-10-CM | POA: Diagnosis present

## 2021-08-01 DIAGNOSIS — Z20822 Contact with and (suspected) exposure to covid-19: Secondary | ICD-10-CM | POA: Diagnosis present

## 2021-08-01 DIAGNOSIS — Z87891 Personal history of nicotine dependence: Secondary | ICD-10-CM

## 2021-08-01 DIAGNOSIS — F05 Delirium due to known physiological condition: Secondary | ICD-10-CM | POA: Diagnosis present

## 2021-08-01 DIAGNOSIS — Z888 Allergy status to other drugs, medicaments and biological substances status: Secondary | ICD-10-CM

## 2021-08-01 DIAGNOSIS — Z7982 Long term (current) use of aspirin: Secondary | ICD-10-CM

## 2021-08-01 LAB — CBC
HCT: 30.4 % — ABNORMAL LOW (ref 36.0–46.0)
Hemoglobin: 9.6 g/dL — ABNORMAL LOW (ref 12.0–15.0)
MCH: 30.8 pg (ref 26.0–34.0)
MCHC: 31.6 g/dL (ref 30.0–36.0)
MCV: 97.4 fL (ref 80.0–100.0)
Platelets: 250 10*3/uL (ref 150–400)
RBC: 3.12 MIL/uL — ABNORMAL LOW (ref 3.87–5.11)
RDW: 13.6 % (ref 11.5–15.5)
WBC: 9.4 10*3/uL (ref 4.0–10.5)
nRBC: 0 % (ref 0.0–0.2)

## 2021-08-01 LAB — URINALYSIS, COMPLETE (UACMP) WITH MICROSCOPIC
Bacteria, UA: NONE SEEN
Bilirubin Urine: NEGATIVE
Glucose, UA: 50 mg/dL — AB
Hgb urine dipstick: NEGATIVE
Ketones, ur: NEGATIVE mg/dL
Leukocytes,Ua: NEGATIVE
Nitrite: NEGATIVE
Protein, ur: NEGATIVE mg/dL
Specific Gravity, Urine: 1.021 (ref 1.005–1.030)
Squamous Epithelial / HPF: NONE SEEN (ref 0–5)
pH: 5 (ref 5.0–8.0)

## 2021-08-01 LAB — BASIC METABOLIC PANEL
Anion gap: 7 (ref 5–15)
BUN: 23 mg/dL (ref 8–23)
CO2: 26 mmol/L (ref 22–32)
Calcium: 8 mg/dL — ABNORMAL LOW (ref 8.9–10.3)
Chloride: 95 mmol/L — ABNORMAL LOW (ref 98–111)
Creatinine, Ser: 0.93 mg/dL (ref 0.44–1.00)
GFR, Estimated: >60 mL/min (ref >60)
Glucose, Bld: 115 mg/dL — ABNORMAL HIGH (ref 70–99)
Potassium: 4.3 mmol/L (ref 3.5–5.1)
Sodium: 128 mmol/L — ABNORMAL LOW (ref 135–145)

## 2021-08-01 LAB — RESP PANEL BY RT-PCR (FLU A&B, COVID) ARPGX2
Influenza A by PCR: NEGATIVE
Influenza B by PCR: NEGATIVE
SARS Coronavirus 2 by RT PCR: NEGATIVE

## 2021-08-01 IMAGING — CT CT HEAD W/O CM
4 series · 16 of 47 positions shown, 18 images · non-contrast
Comparison: Overlapping portion of CT neck [DATE]

CLINICAL DATA: Altered mental status



[Series 2: head wo · axial · 0.41mm/px · z∈[+207,+312]mm · 7 of 29 slices shown, 9 images]
[im 4/29  brain]
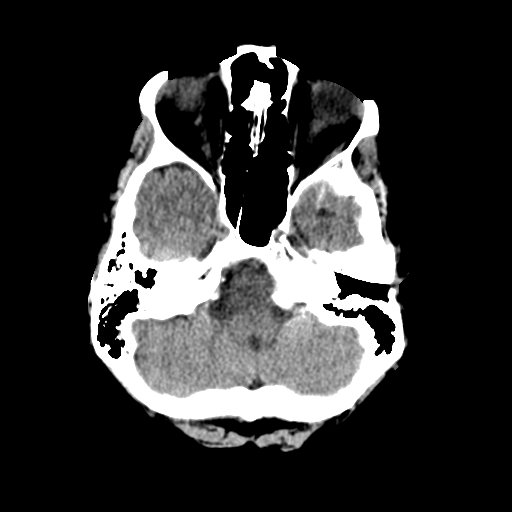
[im 4/29  bone]
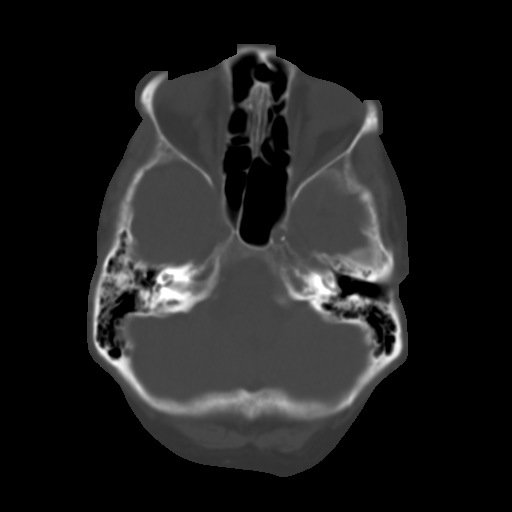
[im 8/29  brain]
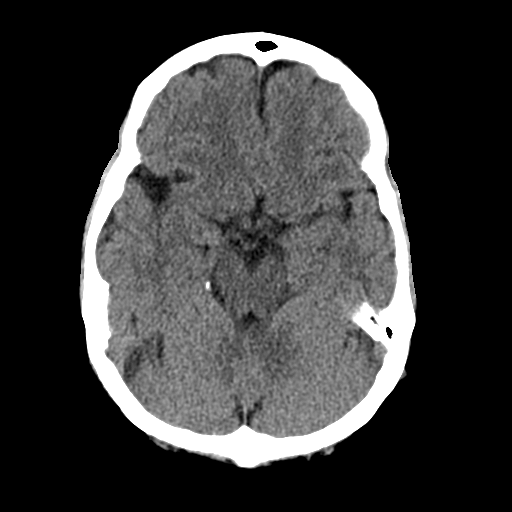
[im 11/29  brain]
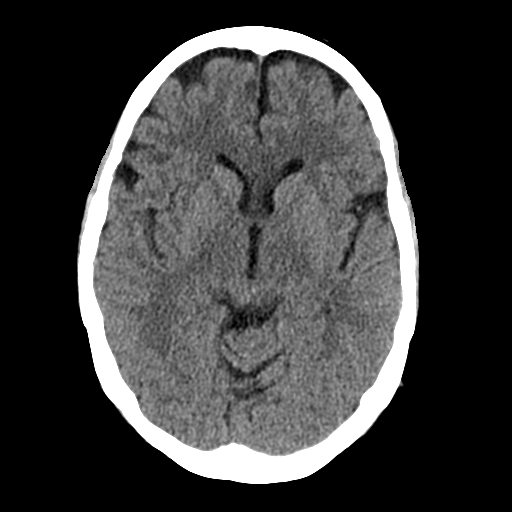
[im 15/29  brain]
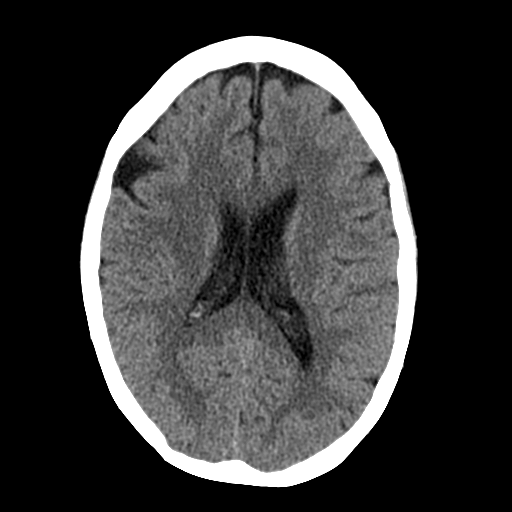
[im 18/29  brain]
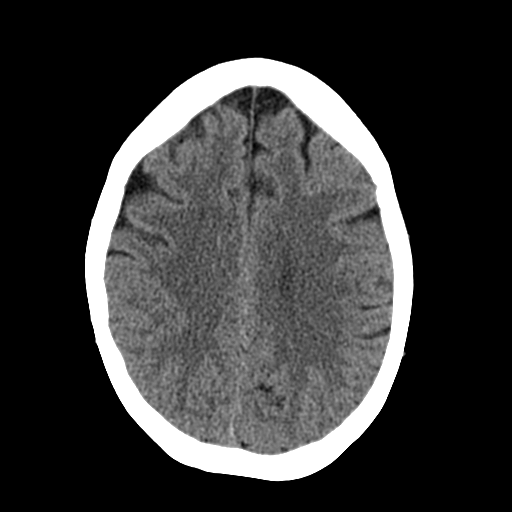
[im 18/29  bone]
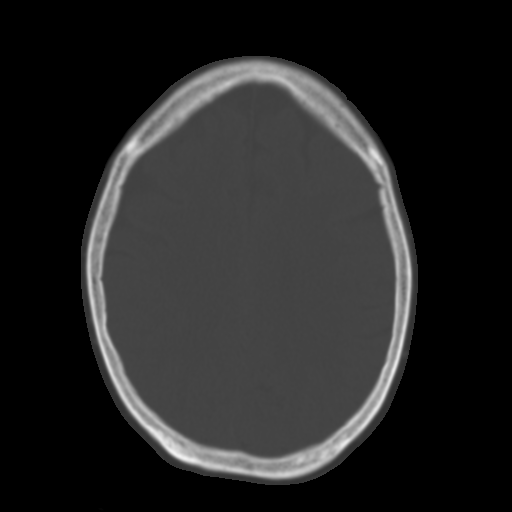
[im 22/29  brain]
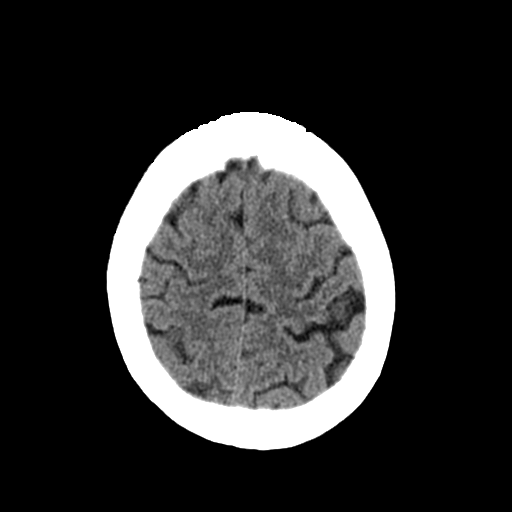
[im 25/29  brain]
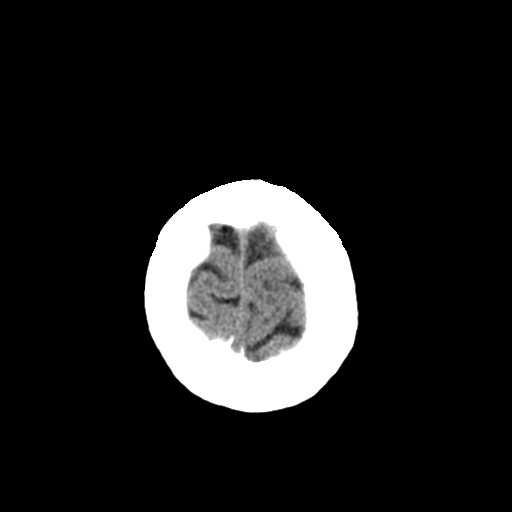

[Series 3: head bone · axial · 0.41mm/px · z∈[+206,+234]mm · 3 of 72 slices shown]
[im 8/72  bone]
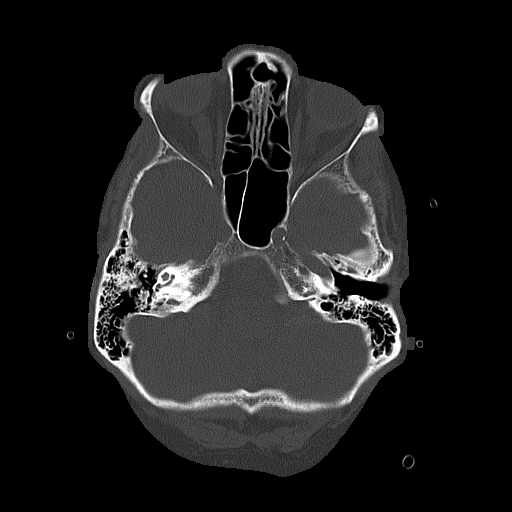
[im 15/72  bone]
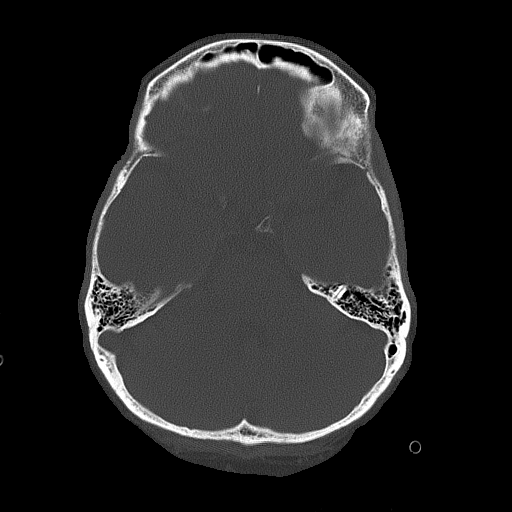
[im 22/72  bone]
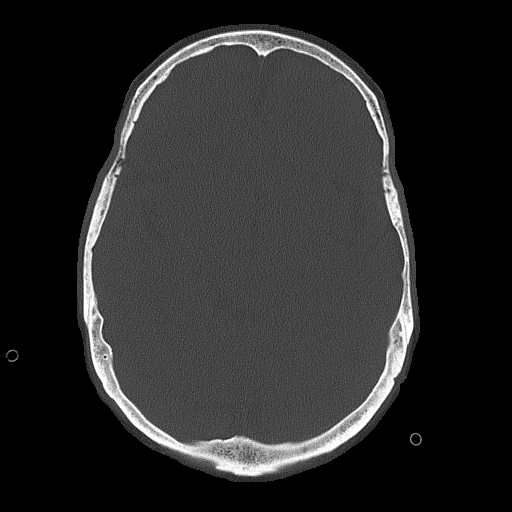

[Series 4: coronal soft tissue · coronal · 0.29mm/px · 3 of 66 slices shown]
[im 22/66  brain]
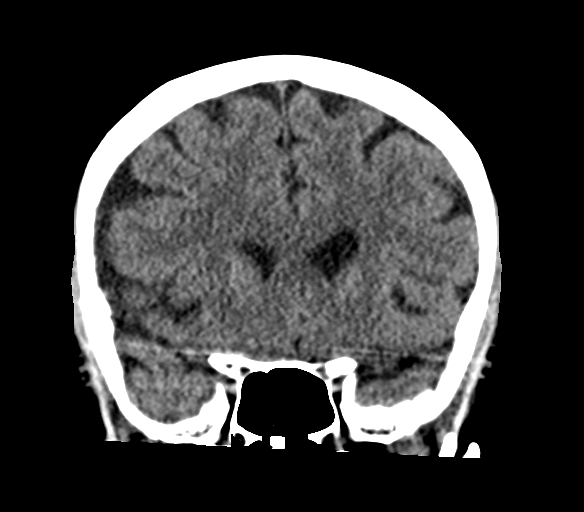
[im 29/66  brain]
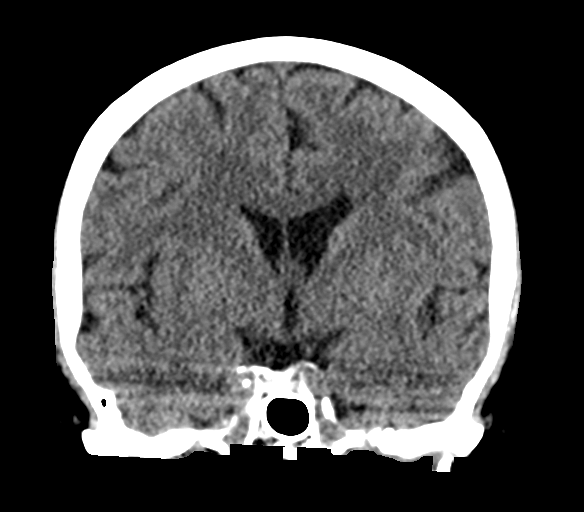
[im 37/66  brain]
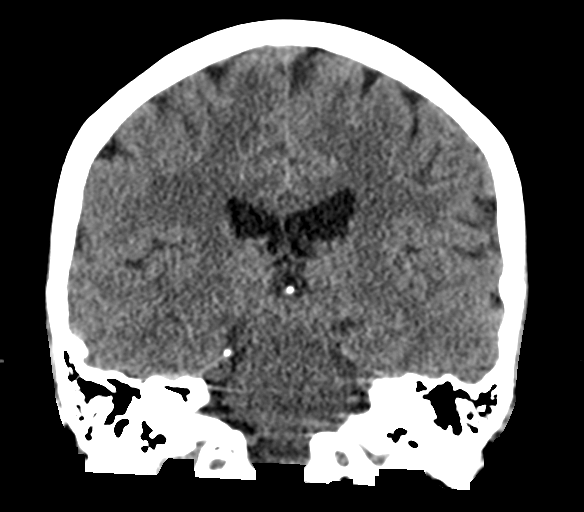

[Series 5: sagittal soft tissue · sagittal · 0.29mm/px · 3 of 54 slices shown]
[im 18/54  brain]
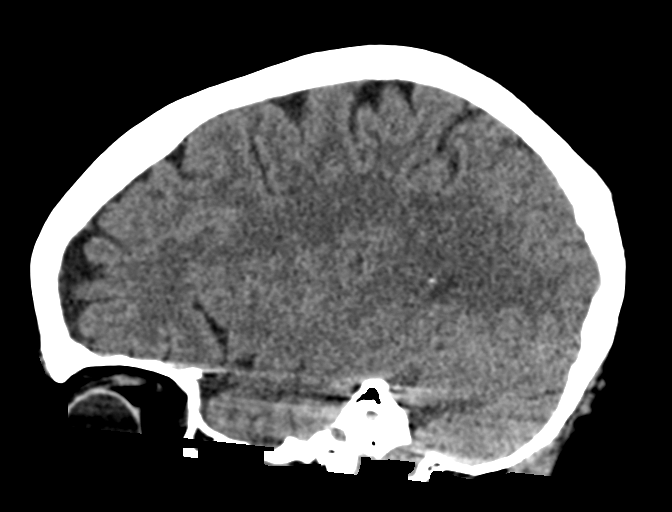
[im 27/54  brain]
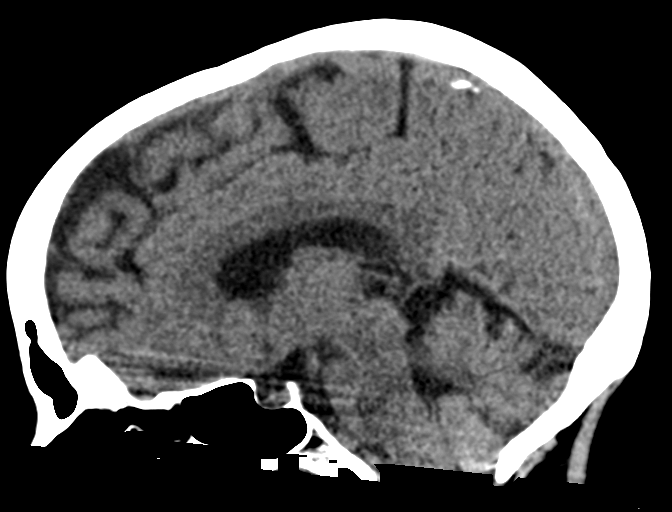
[im 36/54  brain]
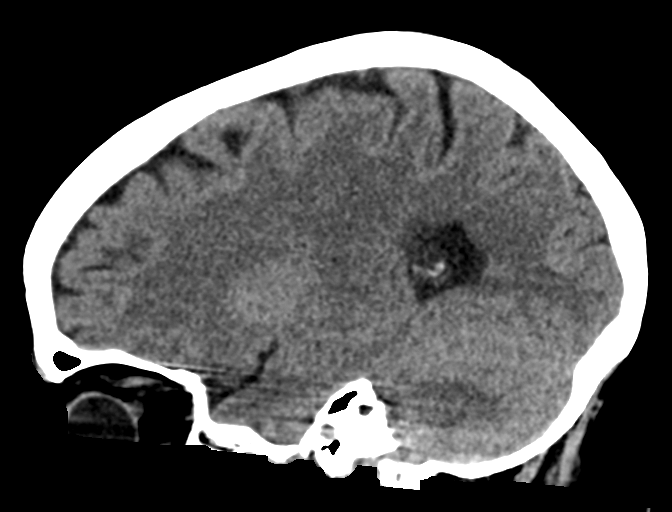

[16 of 47 positions shown; findings below may reference images not displayed]

FINDINGS: Brain: The brainstem, cerebellum, cerebral peduncles, thalami, basal
ganglia, basilar cisterns, and ventricular system appear within
normal limits. No intracranial hemorrhage, mass lesion, or acute
CVA.

Vascular: There is atherosclerotic calcification of the cavernous
carotid arteries bilaterally.

Skull: Unremarkable

Sinuses/Orbits: Unremarkable where included.

Other: Unremarkable
IMPRESSION: 1. No acute intracranial findings.
2. Atherosclerosis.

## 2021-08-01 IMAGING — MR MR HEAD W/O CM
11 series · 43 of 48 positions shown · non-contrast
Comparison: Comparison made with prior head CT from earlier the
same day.

CLINICAL DATA: Initial evaluation for acute mental status change.

EXAM:
MRI HEAD WITHOUT CONTRAST
TECHNIQUE: Multiplanar, multiecho pulse sequences of the brain and surrounding
structures were obtained without intravenous contrast.

[Series 5: ax dwi_tracew · axial · 3.0mm · 0.65mm/px · z∈[-146,+9]mm · 4 of 48 slices shown]
[im 1/48]
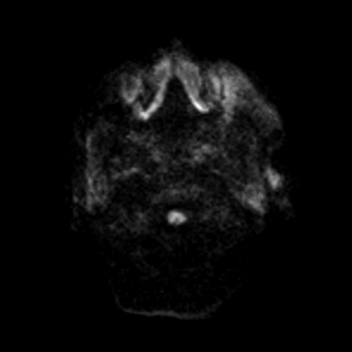
[im 16/48]
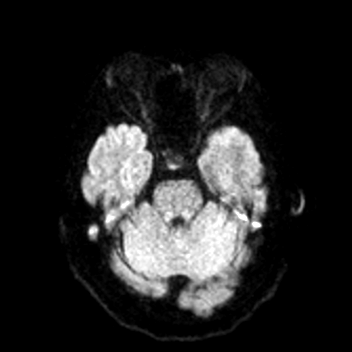
[im 32/48]
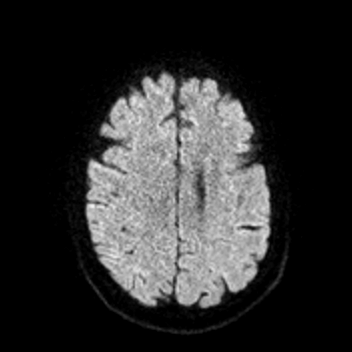
[im 48/48]
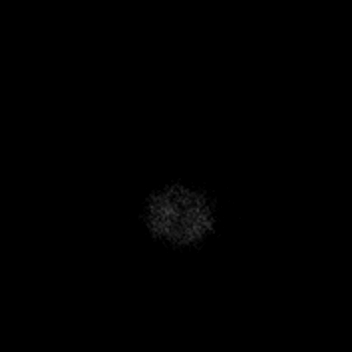

[Series 6: ax dwi_adc · axial · 3.0mm · 0.65mm/px · z∈[-146,+9]mm · 4 of 48 slices shown]
[im 1/48]
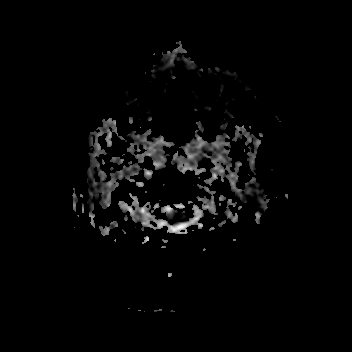
[im 16/48]
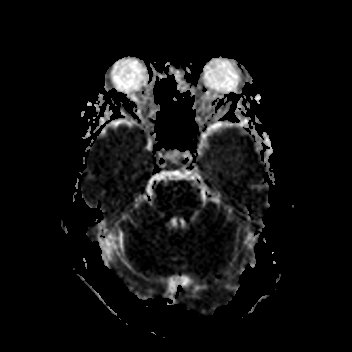
[im 32/48]
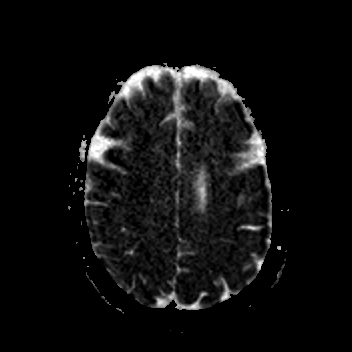
[im 48/48]
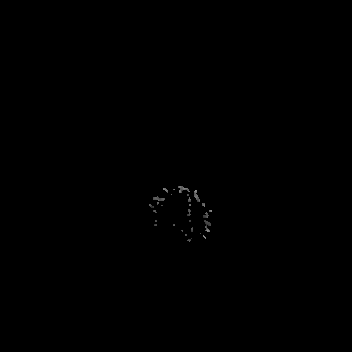

[Series 7: cor dwi_tracew · coronal · 5.0mm · 0.65mm/px · 3 of 38 slices shown]
[im 1/38]
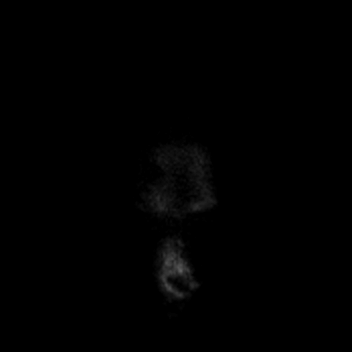
[im 19/38]
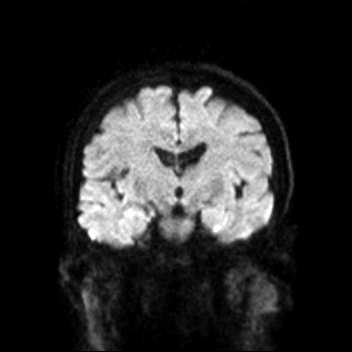
[im 38/38]
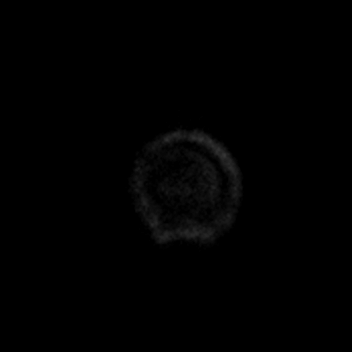

[Series 8: cor dwi_adc · coronal · 5.0mm · 0.65mm/px · 3 of 38 slices shown]
[im 1/38]
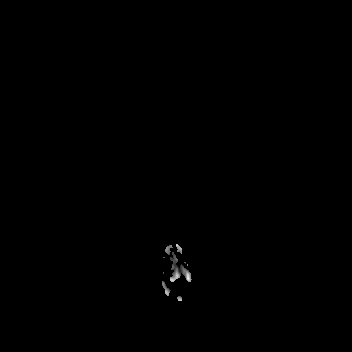
[im 19/38]
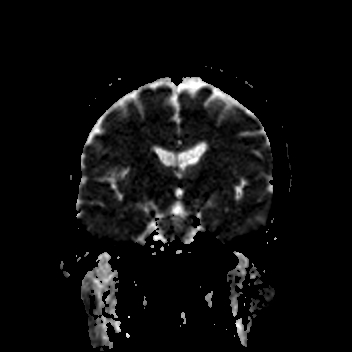
[im 38/38]
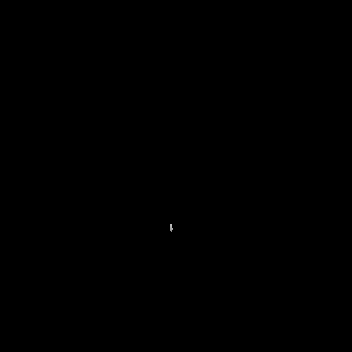

[Series 9: T1 · sagittal · 5.0mm · 0.62mm/px · 2 of 23 slices shown (1 of 2)]
[im 1/23]
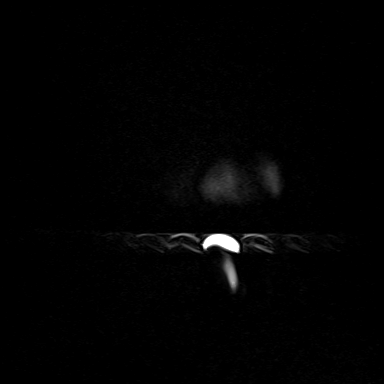
[im 23/23]
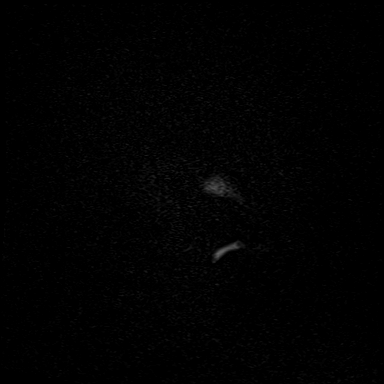

[Series 10: T2 · axial · 5.0mm · 0.53mm/px · z∈[-137,+6]mm · 2 of 25 slices shown (1 of 2)]
[im 1/25]
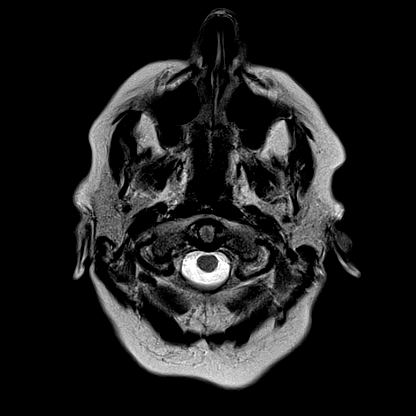
[im 25/25]
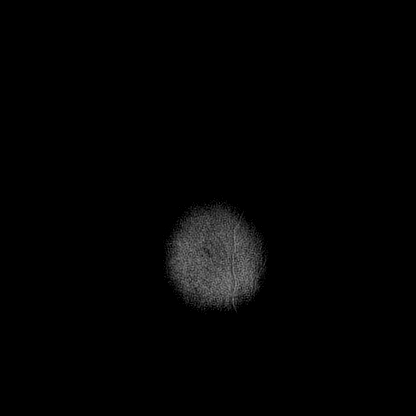

[Series 12: pha_images · axial · 3.0mm · 0.90mm/px · z∈[-155,+13]mm · 5 of 56 slices shown]
[im 1/56]
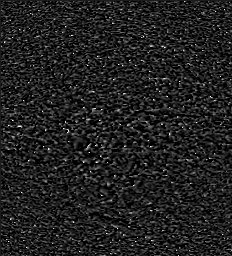
[im 14/56]
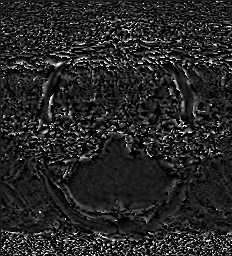
[im 28/56]
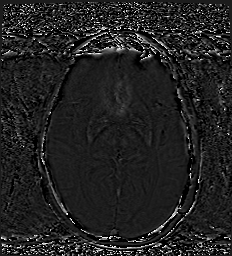
[im 42/56]
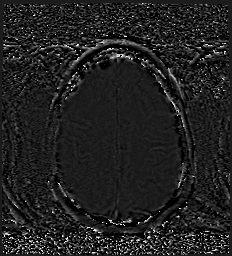
[im 56/56]
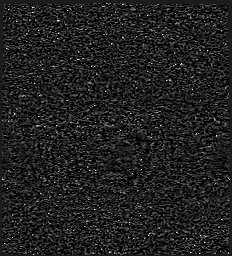

[Series 13: swi_images · axial · 3.0mm · 0.90mm/px · z∈[-155,+22]mm · 5 of 60 slices shown]
[im 1/60]
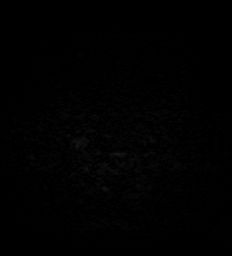
[im 15/60]
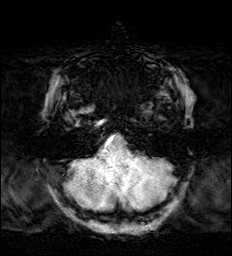
[im 30/60]
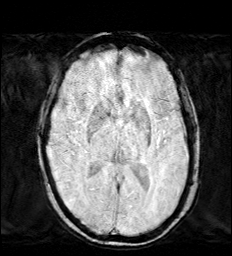
[im 45/60]
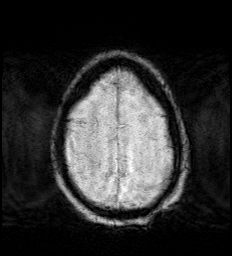
[im 60/60]
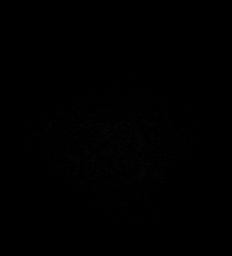

[Series 15: FLAIR · axial · 3.0mm · 0.53mm/px · z∈[-146,+15]mm · 4 of 55 slices shown]
[im 1/55]
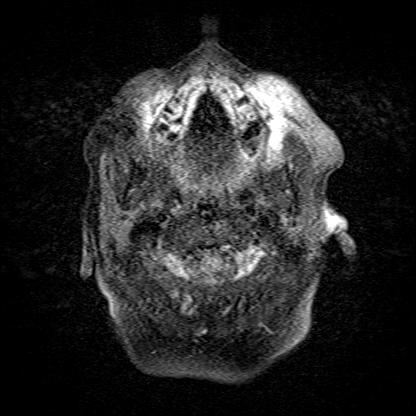
[im 19/55]
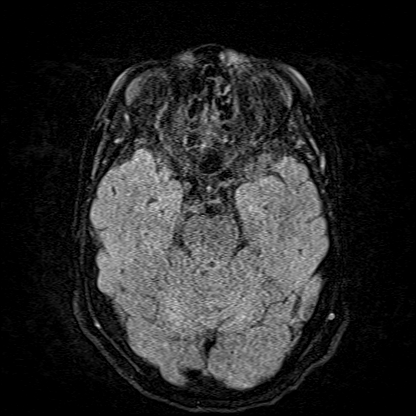
[im 37/55]
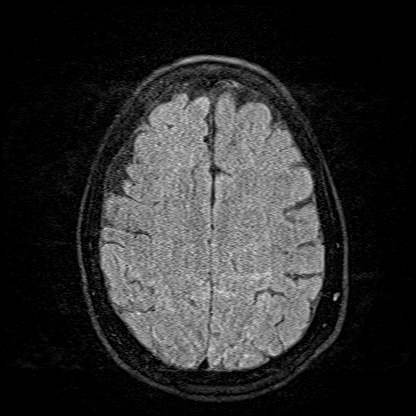
[im 55/55]
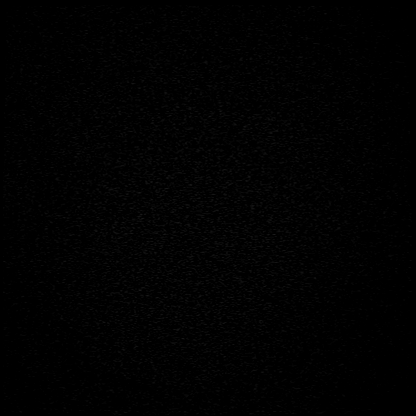

[Series 16: T1 · axial · 1.0mm · 0.98mm/px · z∈[-153,+21]mm · 9 of 176 slices shown (2 of 2)]
[im 1/176]
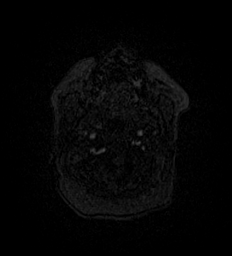
[im 14/176]
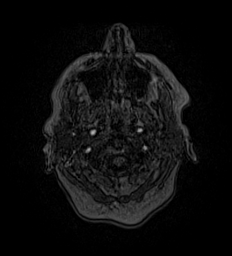
[im 27/176]
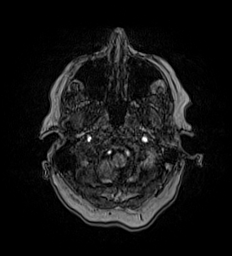
[im 54/176]
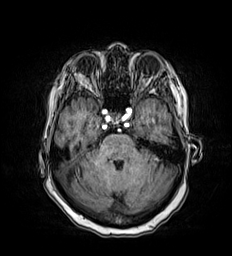
[im 81/176]
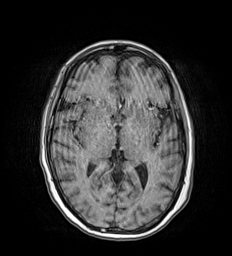
[im 95/176]
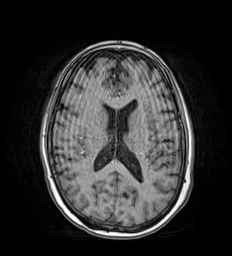
[im 122/176]
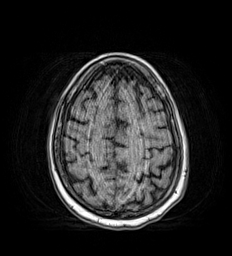
[im 149/176]
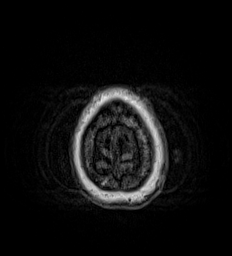
[im 176/176]
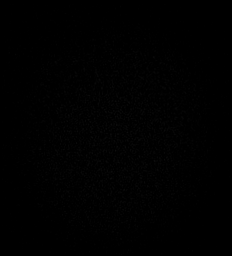

[Series 17: T2 · coronal · 5.0mm · 0.57mm/px · 2 of 29 slices shown (2 of 2)]
[im 1/29]
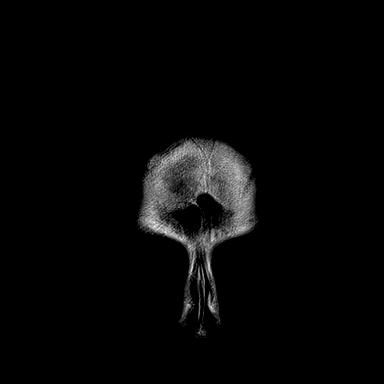
[im 29/29]
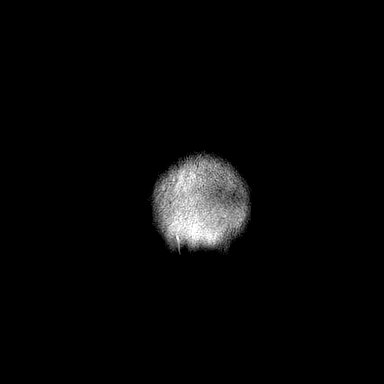

[43 of 48 positions shown; findings below may reference images not displayed]

FINDINGS: Brain: Examination moderately degraded by motion artifact

Cerebral volume within normal limits for age. No significant
cerebral white matter disease seen on this motion degraded exam.

Abnormal restricted diffusion seen involving the central and left
aspect of the anterior genu of the corpus callosum (series 5, image
26). Inferior extension to involve the fornices bilaterally (series
5, image 24). Finding likely reflects changes of an acute infarct,
ACA distribution. No associated hemorrhage or significant mass
effect.

No other diffusion abnormality to suggest acute or subacute
ischemia. Gray-white matter differentiation otherwise maintained. No
areas of encephalomalacia to suggest chronic cortical infarction
elsewhere within the brain. No other acute or chronic intracranial
blood products.

No mass lesion, midline shift or mass effect. No hydrocephalus or
extra-axial fluid collection. Partially empty sella noted. Midline
structures intact.

Vascular: Major intracranial vascular flow voids are maintained.

Skull and upper cervical spine: Craniocervical junction within
normal limits. Bone marrow signal intensity normal. No scalp soft
tissue abnormality.

Sinuses/Orbits: Increased CSF fluid signal intensity seen along the
optic nerve sheaths bilaterally with mild flattening at the
posterior globes (series 10, image 8). Globes and orbital soft
tissues otherwise unremarkable. Mild mucosal thickening noted about
the ethmoidal air cells. Paranasal sinuses are otherwise clear. No
mastoid effusion. Inner ear structures grossly normal.

Other: None.
IMPRESSION: 1. Abnormal restricted diffusion involving the central and left
aspect of the anterior genu of the corpus callosum, with extension
to involve the fornices bilaterally. Finding likely reflects changes
of an acute ACA distribution infarct. No associated hemorrhage or
significant mass effect.
2. Partially empty sella with increased CSF signal intensity along
the optic nerve sheaths bilaterally. While these findings are
nonspecific and may be of no significance, changes such as these can
also be seen in the setting of idiopathic intracranial hypertension.
3. Otherwise unremarkable brain MRI for age.

## 2021-08-01 IMAGING — DX DG CHEST 1V
1 series · 1 of 1 positions shown · non-contrast
Comparison: Chest x-ray [DATE]. CT chest [DATE]

CLINICAL DATA: confusion, low oxygen

EXAM:
CHEST  1 VIEW

[chest ap]
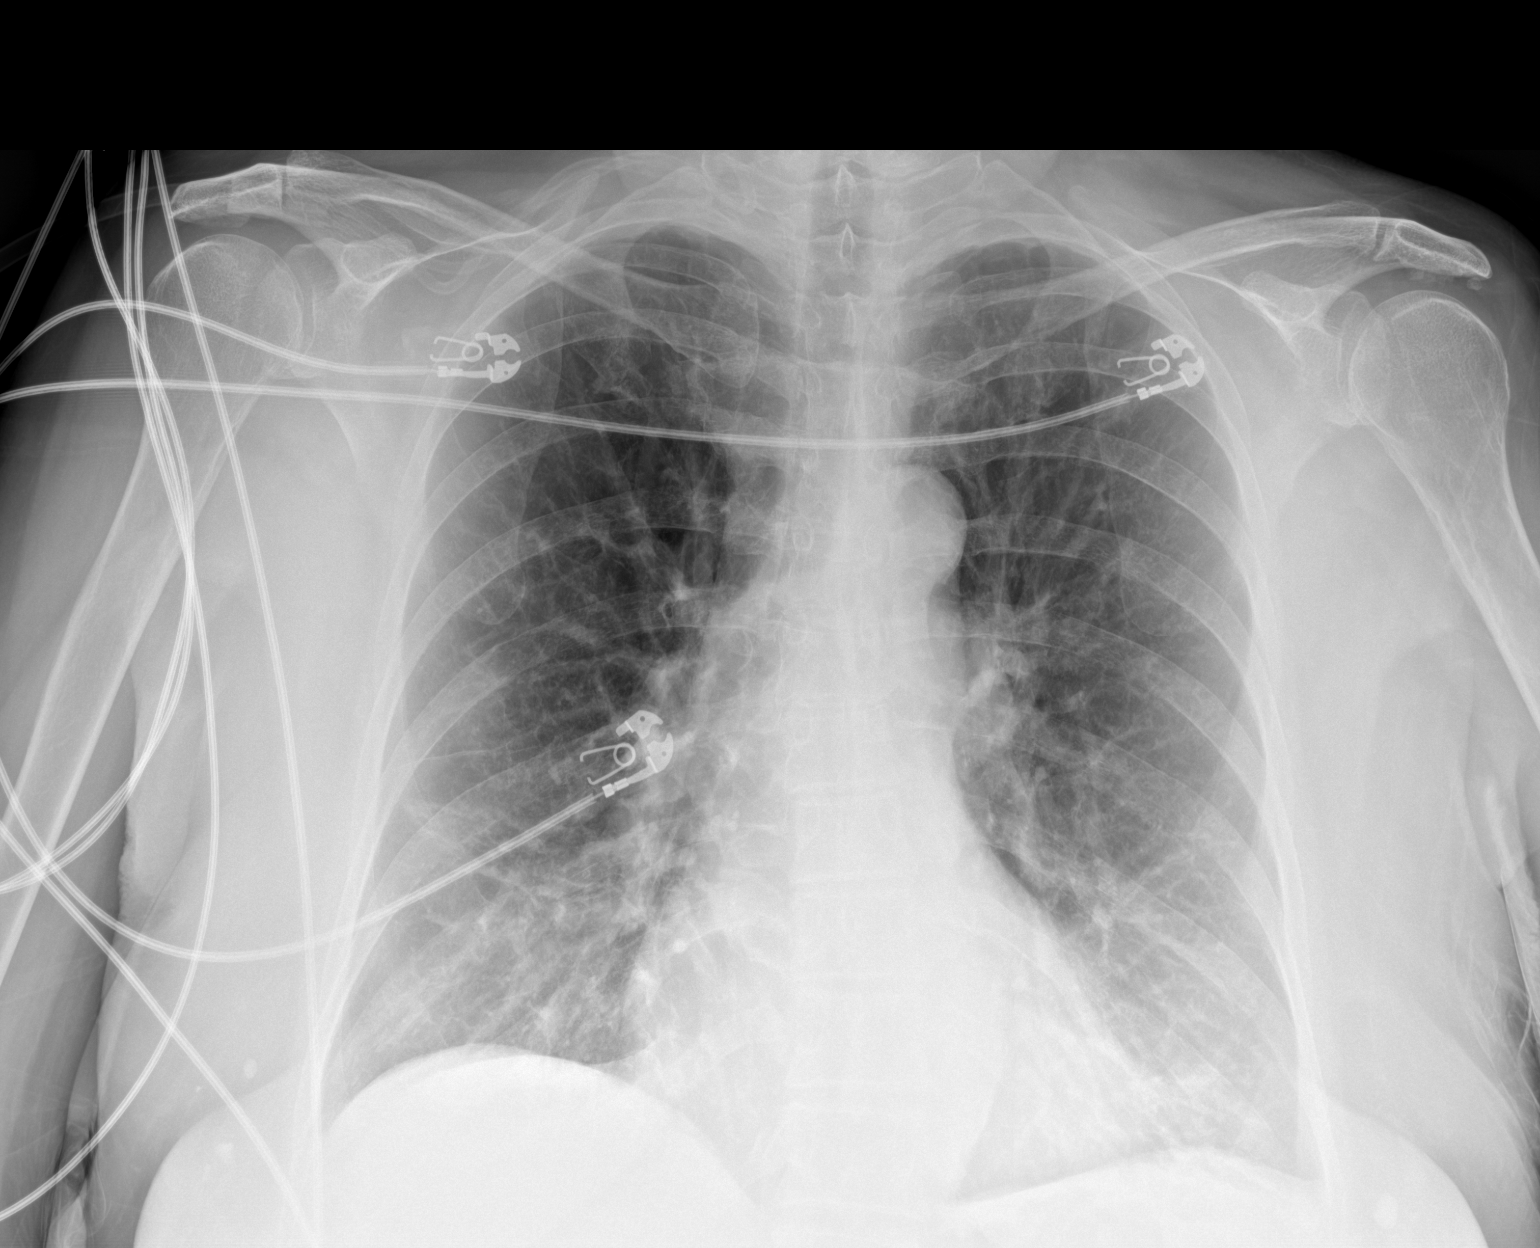

[1 of 1 positions shown; findings below may reference images not displayed]

FINDINGS: The heart and mediastinal contours are within normal limits.

Biapical pleural/pulmonary scarring. No focal consolidation.
Slightly more prominent chronic coarsened interstitial markings
suggestive of overlying acute bronchitic changes. No pleural
effusion. No pneumothorax.

No acute osseous abnormality.
IMPRESSION: 1. Likely acute on chronic bronchitic changes.
2.  Emphysema ([1M]-[1M]).

## 2021-08-01 IMAGING — CT CT ABD-PELV W/ CM
2 of 5 series · 16 of 46 positions shown, 18 images · IV contrast (APPLIED)
Comparison: CT scan [DATE]

CLINICAL DATA: History of recent right total knee arthroplasty.
Postop abdominal pain and altered mental status.

EXAM:
CT ABDOMEN AND PELVIS WITH CONTRAST
TECHNIQUE: Multidetector CT imaging of the abdomen and pelvis was performed
using the standard protocol following bolus administration of
intravenous contrast.

[Series 2: routine abd/pel with · axial · 0.82mm/px · z∈[-528,-98]mm · 13 of 98 slices shown, 15 images]
[im 6/98  soft-tissue]
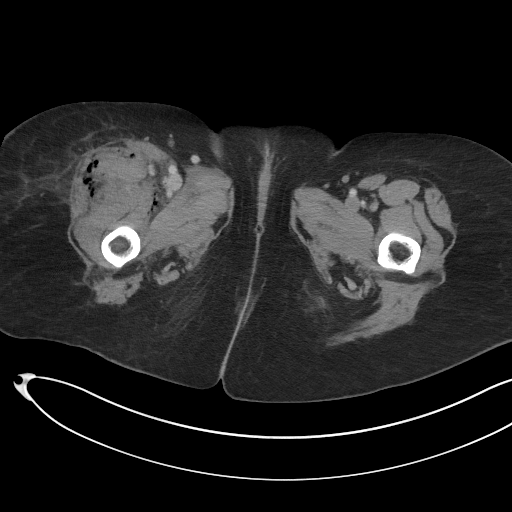
[im 6/98  bone]
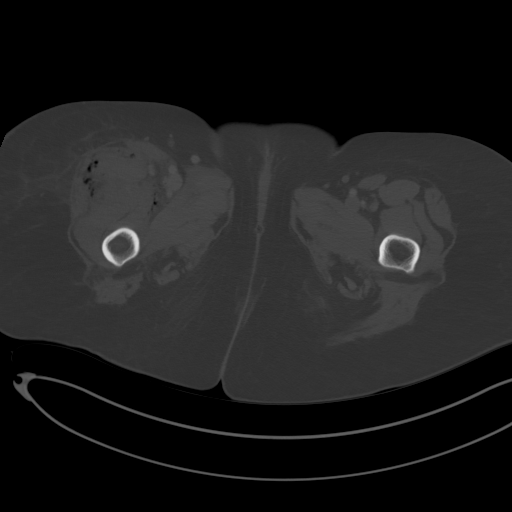
[im 16/98  soft-tissue]
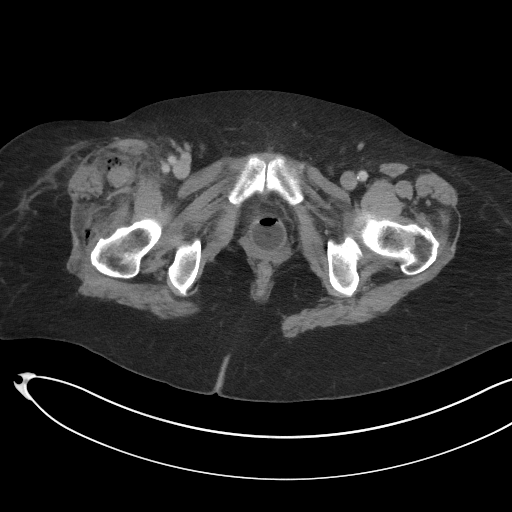
[im 21/98  soft-tissue]
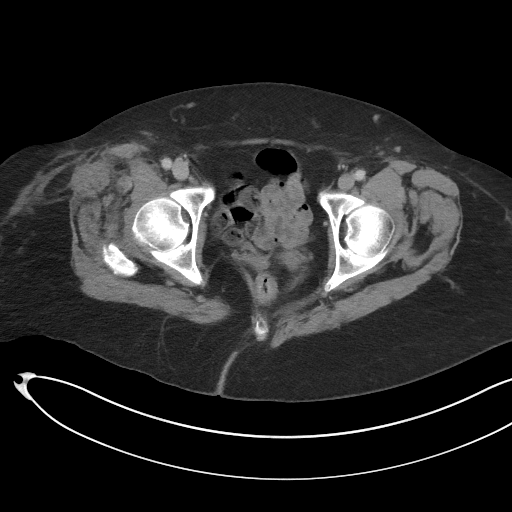
[im 26/98  soft-tissue]
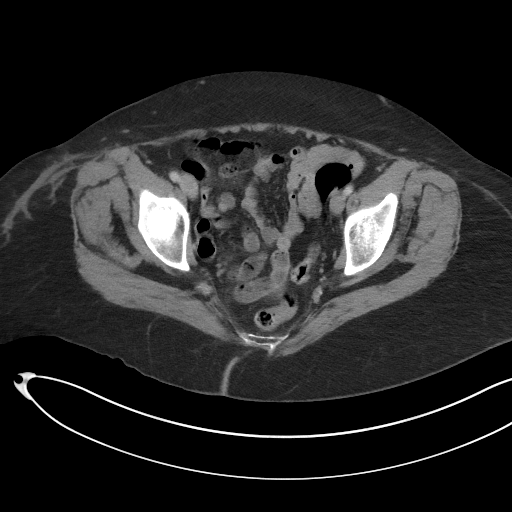
[im 36/98  soft-tissue]
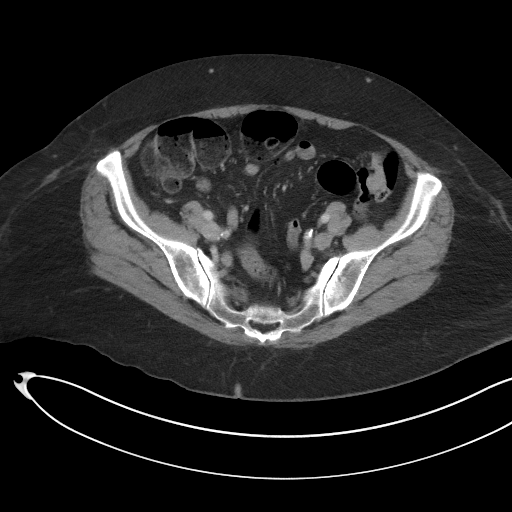
[im 41/98  soft-tissue]
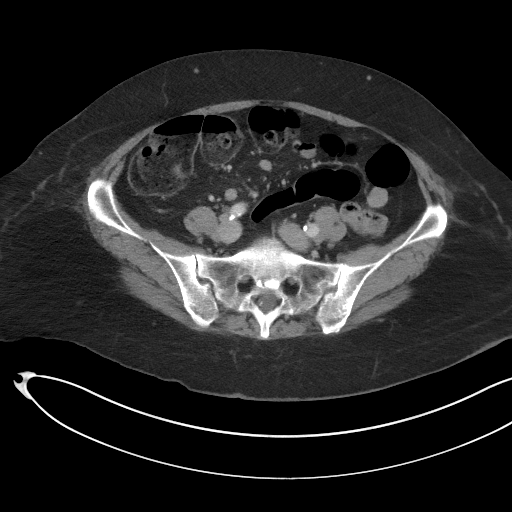
[im 52/98  soft-tissue]
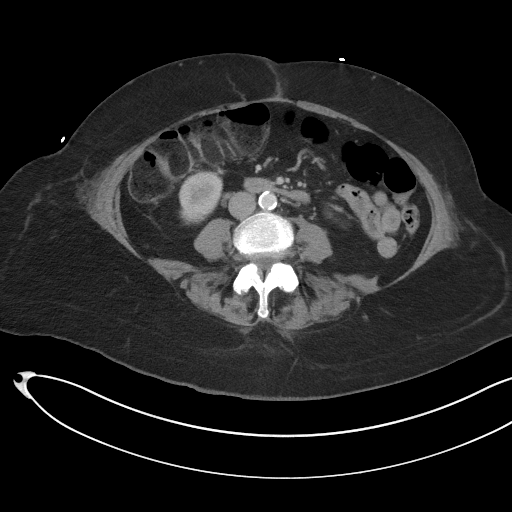
[im 57/98  soft-tissue]
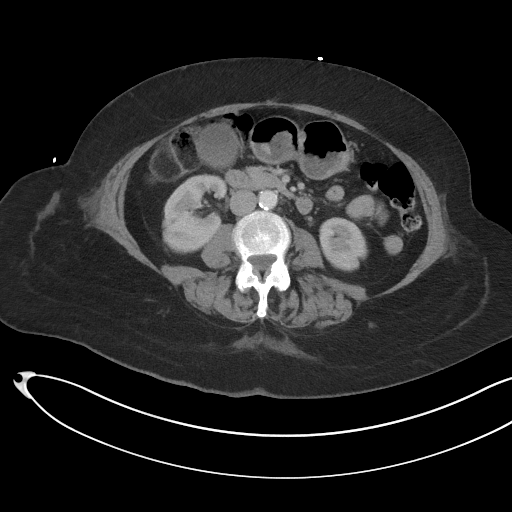
[im 62/98  soft-tissue]
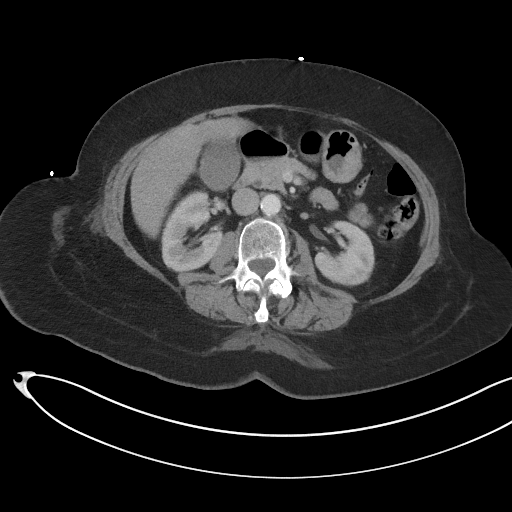
[im 62/98  bone]
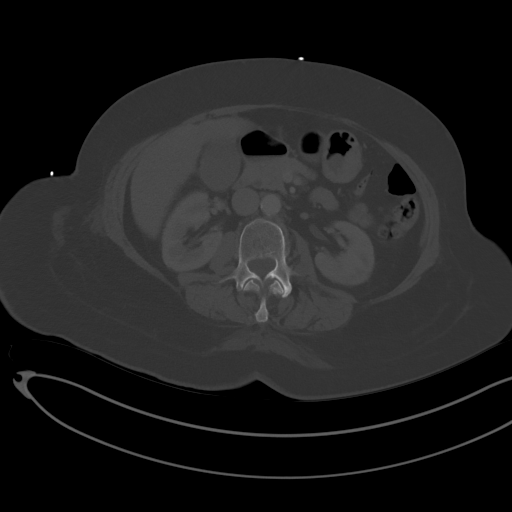
[im 72/98  soft-tissue]
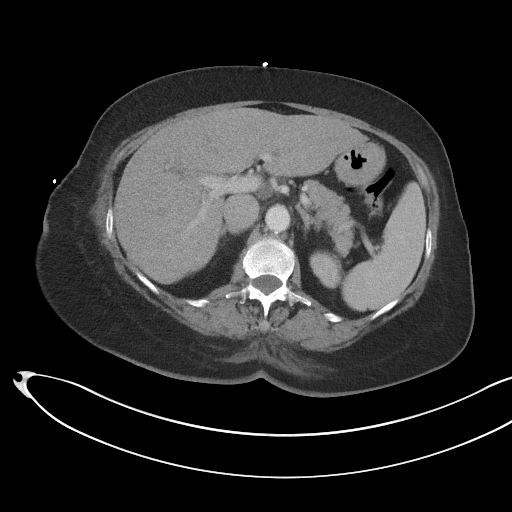
[im 77/98  soft-tissue]
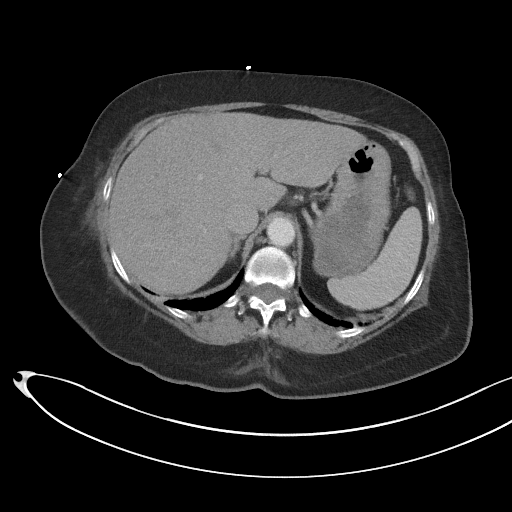
[im 82/98  soft-tissue]
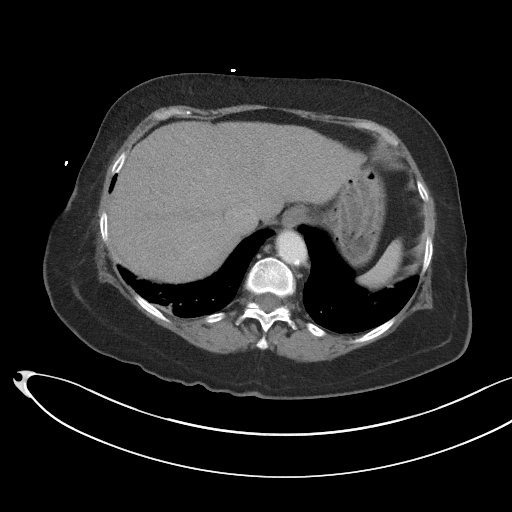
[im 92/98  soft-tissue]
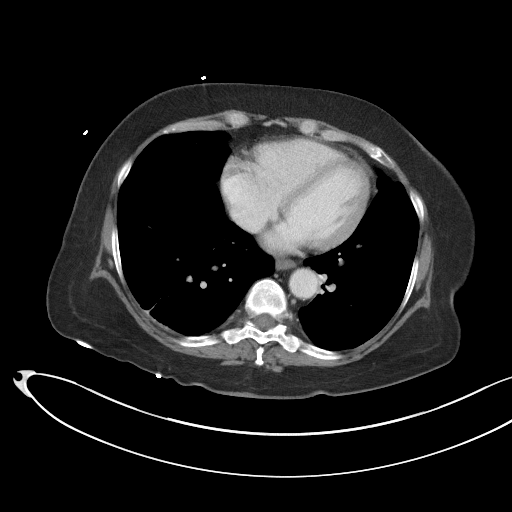

[Series 5: coronal st · coronal · 0.89mm/px · 3 of 98 slices shown]
[im 33/98  soft-tissue]
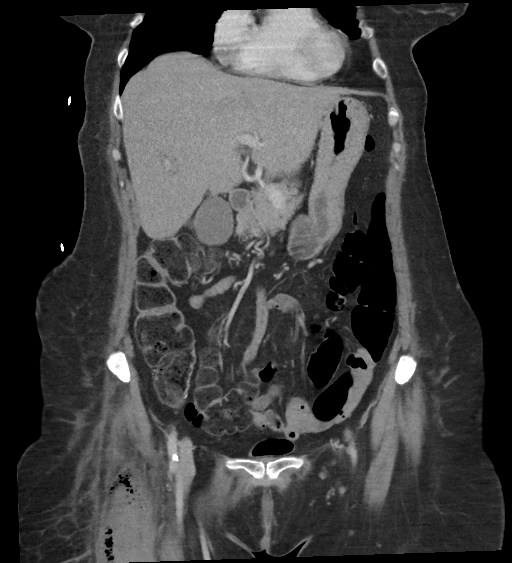
[im 44/98  soft-tissue]
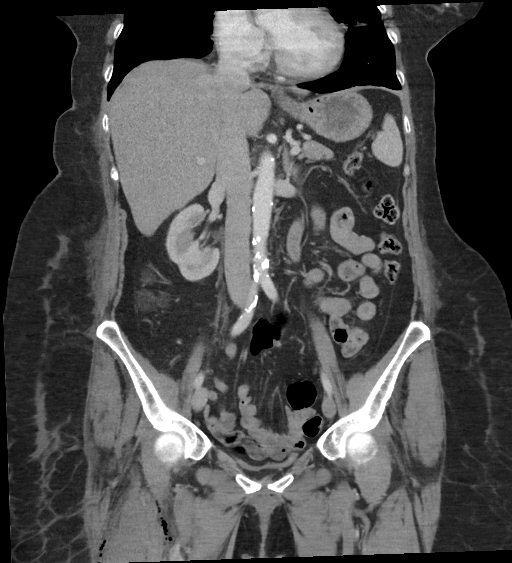
[im 54/98  soft-tissue]
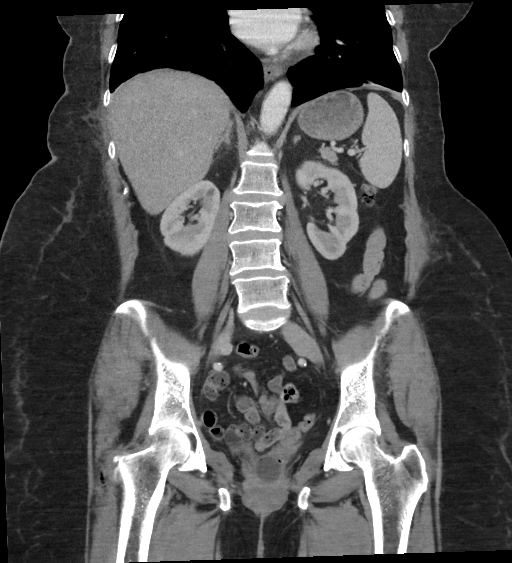

[16 of 46 positions shown; findings below may reference images not displayed]

RADIATION DOSE REDUCTION: This exam was performed according to the
departmental dose-optimization program which includes automated
exposure control, adjustment of the mA and/or kV according to
patient size and/or use of iterative reconstruction technique.

CONTRAST:  100mL OMNIPAQUE IOHEXOL 300 MG/ML  SOLN
FINDINGS: Lower chest: Streaky subsegmental basilar atelectasis. No
infiltrates or effusions. The heart is normal in size. No
pericardial effusion.

Hepatobiliary: No hepatic lesions or intrahepatic biliary
dilatation. Gallbladder is unremarkable. No common bile duct
dilatation.

Pancreas: No mass, inflammation or ductal dilatation.

Spleen: Normal size.  No focal lesions.

Adrenals/Urinary Tract: Adrenal glands and kidneys are unremarkable.
No renal lesions or hydronephrosis. Stable renal cysts. The bladder
is decompressed by a Foley catheter.

Stomach/Bowel: The stomach, duodenum, small bowel and colon are
grossly normal. No acute inflammatory changes, mass lesions or
obstructive findings.

Vascular/Lymphatic: Stable atherosclerotic calcifications involving
the aorta and branch vessels. No aneurysm or dissection. The major
venous structures are patent. No mesenteric or retroperitoneal mass
or adenopathy.

Reproductive: Surgically absent.

Other: No pelvic mass or adenopathy. No free pelvic fluid
collections. No inguinal mass or adenopathy. No abdominal wall
hernia or subcutaneous lesions.

There is fluid and gas in the anterior upper thigh musculature
likely related to the recent knee surgery.

Musculoskeletal: No significant bony findings.
IMPRESSION: 1. No acute abdominal/pelvic findings, mass lesions or adenopathy.
2. Fluid and gas in the anterior upper thigh musculature likely
related to the recent knee surgery.

Aortic Atherosclerosis ([1D]-[1D]).

## 2021-08-01 MED ORDER — MOMETASONE FURO-FORMOTEROL FUM 200-5 MCG/ACT IN AERO
2.0000 | INHALATION_SPRAY | Freq: Two times a day (BID) | RESPIRATORY_TRACT | Status: DC
  Administered 2021-08-02 – 2021-08-03 (×4): 2 via RESPIRATORY_TRACT
  Filled 2021-08-01: qty 8.8

## 2021-08-01 MED ORDER — STROKE: EARLY STAGES OF RECOVERY BOOK: Freq: Once | Status: AC

## 2021-08-01 MED ORDER — PRAVASTATIN SODIUM 20 MG PO TABS
40.0000 mg | ORAL_TABLET | Freq: Every day | ORAL | Status: DC
  Administered 2021-08-02 – 2021-08-03 (×2): 40 mg via ORAL
  Filled 2021-08-01 (×2): qty 2

## 2021-08-01 MED ORDER — SODIUM CHLORIDE 0.9 % IV SOLN: INTRAVENOUS | Status: AC

## 2021-08-01 MED ORDER — IOHEXOL 300 MG/ML  SOLN
100.0000 mL | Freq: Once | INTRAMUSCULAR | Status: AC | PRN
  Administered 2021-08-01: 100 mL via INTRAVENOUS

## 2021-08-01 MED ORDER — PANTOPRAZOLE SODIUM 40 MG IV SOLR
40.0000 mg | INTRAVENOUS | Status: DC
  Administered 2021-08-02: 40 mg via INTRAVENOUS
  Filled 2021-08-01: qty 40

## 2021-08-01 MED ORDER — SODIUM CHLORIDE 0.9 % IV BOLUS
1000.0000 mL | Freq: Once | INTRAVENOUS | Status: AC
  Administered 2021-08-01: 1000 mL via INTRAVENOUS

## 2021-08-01 MED ORDER — RISAQUAD PO CAPS
1.0000 | ORAL_CAPSULE | Freq: Every day | ORAL | Status: DC
  Administered 2021-08-02 – 2021-08-03 (×2): 1 via ORAL
  Filled 2021-08-01 (×2): qty 1

## 2021-08-01 MED ORDER — ASPIRIN 81 MG PO CHEW
324.0000 mg | CHEWABLE_TABLET | Freq: Once | ORAL | Status: AC
  Administered 2021-08-02: 324 mg via ORAL
  Filled 2021-08-01: qty 4

## 2021-08-01 MED ORDER — LORAZEPAM 2 MG/ML IJ SOLN
1.0000 mg | Freq: Once | INTRAMUSCULAR | Status: AC | PRN
  Administered 2021-08-01: 1 mg via INTRAVENOUS
  Filled 2021-08-01 (×2): qty 1

## 2021-08-01 MED ORDER — IPRATROPIUM-ALBUTEROL 0.5-2.5 (3) MG/3ML IN SOLN: 3.0000 mL | Freq: Four times a day (QID) | RESPIRATORY_TRACT | Status: DC | PRN

## 2021-08-01 NOTE — ED Notes (Signed)
Ice applied to right knee to help reduce swelling post knee replacement Sx two days ago.

## 2021-08-01 NOTE — ED Triage Notes (Signed)
See first nurse note, pt to ED with family. Family reports recent right knee surgery. Noticed yesterday pt was confused with agitation. Has not had urine output since yesterday evening.  Pt not answering all orientation questions appropriately.  Hx UTIs Denies recent falls

## 2021-08-01 NOTE — ED Notes (Signed)
Pt brought back from MRI at this time.  

## 2021-08-01 NOTE — ED Triage Notes (Addendum)
Pt comes into the ED via ems from home with c/o AMS since last night,  recent right knee surgery Daughter states she had total right knee replacement on Tuesday, hx of UTI with some confusion but never this bad  #[id number] 100/55 85%RA, 95% on 3L [place]

## 2021-08-01 NOTE — H&P (Signed)
History and Physical    Alexis Reed UKG:[id number] DOB: 21-Dec-1945 DOA: 08/01/2021  PCP: Einar Pheasant, MD    Patient coming from:  Home    Chief Complaint:  Confusion.   HPI:  Alexis Reed is a 76 y.o. female seen in ed with complaints of AMS Right TKA on Tuesday and wed pm started confusion and weakness. Pt has been sleeping and decreased urine output per daughter.  Confusion since Wednesday and is not able to answer questions and does not recall the surgery. Report of dysuria.   Pt has past medical history of UTI, and allergies to multiple medications.  ED Course:   Vitals:   08/01/21 2015 08/01/21 2100 08/01/21 2130 08/01/21 2250  BP:  (!) 100/59 (!) 102/57 112/85  Pulse:  80 81 88  Resp:    17  Temp:      TempSrc:      SpO2: 100% 99% 98% 98%  Weight:      Height:       Emergency room patient is somnolent but wakes up when asked question seems mildly disoriented. CBC shows a hemoglobin of 9.6 and normal platelets and normal white count, CMP shows hyponatremia sodium of 128 chloride 95 glucose 115 calcium of 8.0. CT head was unremarkable CT abdomen and pelvis was unremarkable. MRI of the brain:IMPRESSION: 1. Abnormal restricted diffusion involving the central and left aspect of the anterior genu of the corpus callosum, with extension to involve the fornices bilaterally. Finding likely reflects changes of an acute ACA distribution infarct. No associated hemorrhage or significant mass effect. 2. Partially empty sella with increased CSF signal intensity along the optic nerve sheaths bilaterally. While these findings are nonspecific and may be of no significance, changes such as these can also be seen in the setting of idiopathic intracranial hypertension. 3. Otherwise unremarkable brain MRI for age.  Review of Systems:  Review of Systems  Unable to perform ROS: Other (ativan given for MRI.)  Past Medical History:  Diagnosis Date   Arthritis    Benign  breast lumps    multiple lumps biopsy and remove x's 5 last being 1979 by Dr. Sharlet Salina   COPD (chronic obstructive pulmonary disease) ([place])    MILD-RARELY EVER HAS TO USE COMBIVENT INHALER   Fibroids    s/p hysterectomy   GERD (gastroesophageal reflux disease)    Hypercholesterolemia    Osteoporosis    Personal history of tobacco use, presenting hazards to health 08/07/2015   Urinary tract infection     Past Surgical History:  Procedure Laterality Date   ABDOMINAL HYSTERECTOMY  1997   with bilateral oophorectomy   BREAST BIOPSY Left    multiple done   BREAST BIOPSY Right 2016   stereo- neg. FC changes   BREAST EXCISIONAL BIOPSY Right 1975   multiple biopsies done   BREAST SURGERY Left    lump removed. Unsure of date   BREAST SURGERY Right    lump removed. Unsure of date   COLONOSCOPY  2010   Dr. Vira Agar   COLONOSCOPY WITH PROPOFOL N/A 06/17/2021   Procedure: COLONOSCOPY WITH PROPOFOL;  Surgeon: Lin Landsman, MD;  Location: [facility] ENDOSCOPY;  Service: Gastroenterology;  Laterality: N/A;   ESOPHAGOGASTRODUODENOSCOPY (EGD) WITH PROPOFOL  06/17/2021   Procedure: ESOPHAGOGASTRODUODENOSCOPY (EGD) WITH PROPOFOL;  Surgeon: Lin Landsman, MD;  Location[facility] END[facility]COPY;  Service: Gastroenterology;;   KNEE ARTHROSCOPY WITH LATERAL MENISECTOMY Right 05/08/2020   Procedure: RIGHT KNEE ARTHROSCOPY WITH DEBRIDEMENT AND PARTIAL LATERAL MENISCECTOMY;  Surgeon:  Poggi, Marshall Cork, MD;  Location: [facility] ORS;  Service: Orthopedics;  Laterality: Right;   LAPAROSCOPIC APPENDECTOMY N/A 12/21/2018   Procedure: APPENDECTOMY LAPAROSCOPIC CONVERTED TO OPEN;  Surgeon: Benjamine Sprague, DO;  Location: [facility] ORS;  Service: General;  Laterality: N/A;   TONSILLECTOMY       reports that she quit smoking about 17 years ago. Her smoking use included cigarettes. She has a 30.00 pack-year smoking history. She has never used smokeless tobacco. She reports current alcohol use. She reports that she does not use  drugs.  Allergies  Allergen Reactions   Benzoin Compound Other (See Comments)    After breast surgery After breast surgery   Boniva [Ibandronic Acid] Other (See Comments)    Aching    Codeine Nausea Only   Fosamax [Alendronate Sodium]     Caused aching   Lipitor [Atorvastatin] Hives   Meloxicam Other (See Comments)    GI Upset   Penicillins Swelling    Facial swelling Did it involve swelling of the face/tongue/throat, SOB, or low BP? Yes Did it involve sudden or severe rash/hives, skin peeling, or any reaction on the inside of your mouth or nose? No Did you need to seek medical attention at a hospital or doctor's office? No When did it last happen?      within the last 10 years If all above answers are NO, may proceed with cephalosporin use.     Family History  Problem Relation Age of Onset   Hyperlipidemia Mother    Thyroid disease Mother    Hypertension Mother    Urinary tract infection Mother    Colon cancer Father    Stroke Father    Heart disease Father        myocardial infarction   Breast cancer Neg Hx     Prior to Admission medications   Medication Sig Start Date End Date Taking? Authorizing Provider  acetaminophen (TYLENOL) 500 MG tablet Take 1,000 mg by mouth every 6 (six) hours as needed for moderate pain.    [provider]  Calcium Carb-Cholecalciferol (CALCIUM 500 +D) 500-400 MG-UNIT TABS Take 1 tablet by mouth daily.    [provider]  Cholecalciferol (VITAMIN D) 2000 UNITS tablet Take 2,000 Units by mouth daily.    [provider]  denosumab (PROLIA) 60 MG/ML SOSY injection Inject 60 mg into the skin every 6 (six) months.     [provider]  denosumab (PROLIA) 60 MG/ML SOSY injection Inject into the skin. 12/25/20   [provider]  fluticasone-salmeterol (ADVAIR HFA) 230-21 MCG/ACT inhaler Inhale 2 puffs into the lungs 2 (two) times daily. 11/14/20   Flora Lipps, MD  ibuprofen (ADVIL) 800 MG tablet Take 1  tablet (800 mg total) by mouth every 8 (eight) hours as needed for mild pain or moderate pain. 12/22/18   Lysle Pearl, Isami, DO  Ipratropium-Albuterol (COMBIVENT RESPIMAT) 20-100 MCG/ACT AERS respimat Inhale 1 puff into the lungs every 6 (six) hours as needed for wheezing. 11/14/20   Flora Lipps, MD  nitrofurantoin (MACRODANTIN) 50 MG capsule Take 1 capsule (50 mg total) by mouth daily. 08/07/20   Billey Co, MD  omeprazole (PRILOSEC) 20 MG capsule Take 1 capsule (20 mg total) by mouth daily. 08/30/20   Einar Pheasant, MD  pravastatin (PRAVACHOL) 40 MG tablet Take 1 tablet (40 mg total) by mouth daily. 05/20/21   Einar Pheasant, MD  Probiotic Product (PROBIOTIC DAILY PO) Take 1 capsule by mouth daily.    [provider]  Physical Exam: Vitals:   08/01/21 2015 08/01/21 2100 08/01/21 2130 08/01/21 2250  BP:  (!) 100/59 (!) 102/57 112/85  Pulse:  80 81 88  Resp:    17  Temp:      TempSrc:      SpO2: 100% 99% 98% 98%  Weight:      Height:       Physical Exam Vitals and nursing note reviewed.  Constitutional:      General: She is not in acute distress.    Appearance: Normal appearance. She is not ill-appearing.  HENT:     Head: Normocephalic and atraumatic.     Right Ear: External ear normal.     Left Ear: External ear normal.     Nose: Nose normal.     Mouth/Throat:     Mouth: Mucous membranes are moist.  Eyes:     Extraocular Movements: Extraocular movements intact.     Pupils: Pupils are equal, round, and reactive to light.  Neck:     Vascular: Carotid bruit present.     Comments: Left carotid bruit,.  Cardiovascular:     Rate and Rhythm: Normal rate and regular rhythm.     Pulses: Normal pulses.     Heart sounds: Normal heart sounds.  Pulmonary:     Effort: Pulmonary effort is normal.     Breath sounds: Normal breath sounds.  Abdominal:     General: Bowel sounds are normal. There is no distension.     Palpations: Abdomen is soft. There is no mass.      Tenderness: There is no abdominal tenderness. There is no guarding.     Hernia: No hernia is present.  Musculoskeletal:     Right lower leg: Edema present.     Left lower leg: No edema.  Skin:    General: Skin is warm.  Neurological:     General: No focal deficit present.     Mental Status: She is alert and oriented to person, place, and time.     Cranial Nerves: No cranial nerve deficit.     Motor: No weakness.  Psychiatric:        Mood and Affect: Mood normal.        Behavior: Behavior normal.  \  Labs on Admission: I have personally reviewed following labs and imaging studies No results for input(s): CKTOTAL, CKMB, TROPONINI in the last 72 hours. Lab Results  Component Value Date   WBC 9.4 08/01/2021   HGB 9.6 (L) 08/01/2021   HCT 30.4 (L) 08/01/2021   MCV 97.4 08/01/2021   PLT 250 08/01/2021    Recent Labs  Lab 08/01/21 1429  NA 128*  K 4.3  CL 95*  CO2 26  BUN 23  CREATININE 0.93  CALCIUM 8.0*  GLUCOSE 115*   Lab Results  Component Value Date   CHOL 188 05/08/2021   HDL 57.40 05/08/2021   LDLCALC 110 (H) 05/08/2021   TRIG 103.0 05/08/2021   No results found for: DDIMER Invalid input(s): POCBNP   COVID-19 Labs No results for input(s): DDIMER, FERRITIN, LDH, CRP in the last 72 hours. Lab Results  Component Value Date   SARSCOV2NAA NEGATIVE 08/01/2021   SARSCOV2NAA NEGATIVE 12/04/2020   [place] NEGATIVE 05/07/2020   SARSCOV2NAA NOT DETECTED 12/17/2018    Radiological Exams on Admission: DG Chest 1 View  Result Date: 08/01/2021 CLINICAL DATA:  confusion, low oxygen EXAM: CHEST  1 VIEW COMPARISON:  Chest x-ray 01/18/2014. CT chest 12/03/2020 FINDINGS: The heart and mediastinal  contours are within normal limits. Biapical pleural/pulmonary scarring. No focal consolidation. Slightly more prominent chronic coarsened interstitial markings suggestive of overlying acute bronchitic changes. No pleural effusion. No pneumothorax. No acute osseous abnormality.  IMPRESSION: 1. Likely acute on chronic bronchitic changes. 2.  Emphysema (ICD10-J43.9). Electronically Signed   By: Iven Finn M.D.   On: 08/01/2021 17:03   CT Head Wo Contrast  Result Date: 08/01/2021 CLINICAL DATA:  Altered mental status EXAM: CT HEAD WITHOUT CONTRAST TECHNIQUE: Contiguous axial images were obtained from the base of the skull through the vertex without intravenous contrast. RADIATION DOSE REDUCTION: This exam was performed according to the departmental dose-optimization program which includes automated exposure control, adjustment of the mA and/or kV according to patient size and/or use of iterative reconstruction technique. COMPARISON:  Overlapping portion of CT neck 05/28/2020 FINDINGS: Brain: The brainstem, cerebellum, cerebral peduncles, thalami, basal ganglia, basilar cisterns, and ventricular system appear within normal limits. No intracranial hemorrhage, mass lesion, or acute CVA. Vascular: There is atherosclerotic calcification of the cavernous carotid arteries bilaterally. Skull: Unremarkable Sinuses/Orbits: Unremarkable where included. Other: Unremarkable IMPRESSION: 1. No acute intracranial findings. 2. Atherosclerosis. Electronically Signed   By: Van Clines M.D.   On: 08/01/2021 17:30   MR BRAIN WO CONTRAST  Result Date: 08/01/2021 CLINICAL DATA:  Initial evaluation for acute mental status change. EXAM: MRI HEAD WITHOUT CONTRAST TECHNIQUE: Multiplanar, multiecho pulse sequences of the brain and surrounding structures were obtained without intravenous contrast. COMPARISON:  Comparison made with prior head CT from earlier the same day. FINDINGS: Brain: Examination moderately degraded by motion artifact Cerebral volume within normal limits for age. No significant cerebral white matter disease seen on this motion degraded exam. Abnormal restricted diffusion seen involving the central and left aspect of the anterior genu of the corpus callosum (series 5, image 26).  Inferior extension to involve the fornices bilaterally (series 5, image 24). Finding likely reflects changes of an acute infarct, ACA distribution. No associated hemorrhage or significant mass effect. No other diffusion abnormality to suggest acute or subacute ischemia. Gray-white matter differentiation otherwise maintained. No areas of encephalomalacia to suggest chronic cortical infarction elsewhere within the brain. No other acute or chronic intracranial blood products. No mass lesion, midline shift or mass effect. No hydrocephalus or extra-axial fluid collection. Partially empty sella noted. Midline structures intact. Vascular: Major intracranial vascular flow voids are maintained. Skull and upper cervical spine: Craniocervical junction within normal limits. Bone marrow signal intensity normal. No scalp soft tissue abnormality. Sinuses/Orbits: Increased CSF fluid signal intensity seen along the optic nerve sheaths bilaterally with mild flattening at the posterior globes (series 10, image 8). Globes and orbital soft tissues otherwise unremarkable. Mild mucosal thickening noted about the ethmoidal air cells. Paranasal sinuses are otherwise clear. No mastoid effusion. Inner ear structures grossly normal. Other: None. IMPRESSION: 1. Abnormal restricted diffusion involving the central and left aspect of the anterior genu of the corpus callosum, with extension to involve the fornices bilaterally. Finding likely reflects changes of an acute ACA distribution infarct. No associated hemorrhage or significant mass effect. 2. Partially empty sella with increased CSF signal intensity along the optic nerve sheaths bilaterally. While these findings are nonspecific and may be of no significance, changes such as these can also be seen in the setting of idiopathic intracranial hypertension. 3. Otherwise unremarkable brain MRI for age. Electronically Signed   By: Jeannine Boga M.D.   On: 08/01/2021 23:06   CT ABDOMEN  PELVIS W CONTRAST  Result Date: 08/01/2021 CLINICAL DATA:  History of recent right total knee arthroplasty. Postop abdominal pain and altered mental status. EXAM: CT ABDOMEN AND PELVIS WITH CONTRAST TECHNIQUE: Multidetector CT imaging of the abdomen and pelvis was performed using the standard protocol following bolus administration of intravenous contrast. RADIATION DOSE REDUCTION: This exam was performed according to the departmental dose-optimization program which includes automated exposure control, adjustment of the mA and/or kV according to patient size and/or use of iterative reconstruction technique. CONTRAST:  138mL OMNIPAQUE IOHEXOL 300 MG/ML  SOLN COMPARISON:  CT scan 01/28/2019 FINDINGS: Lower chest: Streaky subsegmental basilar atelectasis. No infiltrates or effusions. The heart is normal in size. No pericardial effusion. Hepatobiliary: No hepatic lesions or intrahepatic biliary dilatation. Gallbladder is unremarkable. No common bile duct dilatation. Pancreas: No mass, inflammation or ductal dilatation. Spleen: Normal size.  No focal lesions. Adrenals/Urinary Tract: Adrenal glands and kidneys are unremarkable. No renal lesions or hydronephrosis. Stable renal cysts. The bladder is decompressed by a Foley catheter. Stomach/Bowel: The stomach, duodenum, small bowel and colon are grossly normal. No acute inflammatory changes, mass lesions or obstructive findings. Vascular/Lymphatic: Stable atherosclerotic calcifications involving the aorta and branch vessels. No aneurysm or dissection. The major venous structures are patent. No mesenteric or retroperitoneal mass or adenopathy. Reproductive: Surgically absent. Other: No pelvic mass or adenopathy. No free pelvic fluid collections. No inguinal mass or adenopathy. No abdominal wall hernia or subcutaneous lesions. There is fluid and gas in the anterior upper thigh musculature likely related to the recent knee surgery. Musculoskeletal: No significant bony  findings. IMPRESSION: 1. No acute abdominal/pelvic findings, mass lesions or adenopathy. 2. Fluid and gas in the anterior upper thigh musculature likely related to the recent knee surgery. Aortic Atherosclerosis (ICD10-I70.0). Electronically Signed   By: Marijo Sanes M.D.   On: 08/01/2021 17:35    EKG: Independently reviewed.  Sinus rhythm 68 with a left anterior fascicular block, normal QRS short PR interval of 90 QTC within normal limits and Q waves in every and V2.   Assessment/Plan: Principal Problem:   AMS (altered mental status) Active Problems:   GERD (gastroesophageal reflux disease)   COPD (chronic obstructive pulmonary disease) ([facility])   Edema of right lower leg   Acute CVA (cerebrovascular accident) ([place])   AMS: Most likely due to her acute cva. Pt was given as 324 mg in ed,  Cont pravastatin.  We will cont that daily.  Neuro checks  / thyroid studies,  No bp meds in chart.  Pt/OT consult.  MRA of neck   GERD: Iv ppi. Marland Kitchen COPD: Stable.   Edema of right leg: Venous doppler.   Abnormal EKG: 2 d echo.   Anemia: From her recent surgery and blood loss.  Hyponatremia: Due to dehydration. Ns at 100 x 1 day.    DVT prophylaxis:  Heparin   Code Status:  Full Code    Family Communication:  Espino,Kent (Spouse)  [phone] (Mobile)   Disposition Plan:  Home    Consults called:  Tarbet,Kent (Spouse)  [phone] (Mobile)  Admission status: Inpatient.     Medical Decision Making   Coding    Para Skeans MD [facility]  6 PM- 2 AM. Please contact me via secure Chat 6 PM-2 AM. To contact the [facility] Attending or Consulting provider [place] or covering provider during after hours [place], for this patient.   Check the care team in [facility] and look for a) attending/consulting [facility] provider listed and b) the [facility] team listed Log into [email or web address] and use [facility]'s universal password to access. If you do not have the password, please contact the  hospital operator. Locate the [facility] provider you are looking for under [facility] and page to a number that you can be directly reached. If you still have difficulty reaching the provider, please page the [facility] (Director on Call) for the Hospitalists listed on amion for assistance. [email or web address] 08/02/2021, 12:41 AM

## 2021-08-01 NOTE — ED Notes (Signed)
Bladder scan 491

## 2021-08-01 NOTE — ED Provider Notes (Addendum)
[facility] Provider Note    Event Date/Time   First MD Initiated Contact with Patient 08/01/21 1546     (approximate)   History   Altered Mental Status   HPI  EVALYNNE LOCURTO is a 76 y.o. female with a history of GERD, COPD who is brought to the ED due to confusion for the past 24 hours. Patient just had a knee surgery within the past week, denies any fever.  Her postop pain has been managed with a regimen of tramadol, oxycodone, methocarbamol, and gabapentin which she has taken on a continuous schedule.  No vomiting or falls or head injury.  Family is noticed at home that patient has been confused to time, does not recall the surgery (which was not done under general anesthesia but rather regional block).  Patient's responses to question do seem at times confabulatory.  Other than the knee pain which is not out of proportion and not escalating, patient's only other complaint is dysuria.  Family notes a history of UTI in the past     Physical Exam   Triage Vital Signs: ED Triage Vitals [08/01/21 1425]  Enc Vitals Group     BP 119/68     Pulse Rate 81     Resp 20     Temp 98.5 F (36.9 C)     Temp Source Oral     SpO2 96 %     Weight 191 lb 12.8 oz (87 kg)     Height 5\' 3"  (1.6 m)     Head Circumference      Peak Flow      Pain Score 0     Pain Loc      Pain Edu?      Excl. in [place]?     Most recent vital signs: Vitals:   08/01/21 2130 08/01/21 2250  BP: (!) 102/57 112/85  Pulse: 81 88  Resp:  17  Temp:    SpO2: 98% 98%     General: Awake, no distress.  CV:  Good peripheral perfusion.  Regular rate and rhythm, normal dorsalis pedis pulses Resp:  Normal effort.  Clear to auscultation bilaterally Abd:  No distention.  Soft and nontender Other:  No lower extremity swelling or tenderness.  No inflammation at the right knee, no joint line tenderness, no large effusion.  Surgical site appears to be healing appropriately   ED Results /  Procedures / Treatments   Labs (all labs ordered are listed, but only abnormal results are displayed) Labs Reviewed  CBC - Abnormal; Notable for the following components:      Result Value   RBC 3.12 (*)    Hemoglobin 9.6 (*)    HCT 30.4 (*)    All other components within normal limits  BASIC METABOLIC PANEL - Abnormal; Notable for the following components:   Sodium 128 (*)    Chloride 95 (*)    Glucose, Bld 115 (*)    Calcium 8.0 (*)    All other components within normal limits  URINALYSIS, COMPLETE (UACMP) WITH MICROSCOPIC - Abnormal; Notable for the following components:   Color, Urine YELLOW (*)    APPearance CLEAR (*)    Glucose, UA 50 (*)    All other components within normal limits  RESP PANEL BY RT-PCR (FLU A&B, COVID) [id number]  URINE CULTURE     EKG     RADIOLOGY Chest viewed and interpreted by me, no acute findings, no lobar consolidation pulm edema  pleural effusion or pneumothorax.  Radiology report reviewed.  CT head unremarkable CT abdomen pelvis unremarkable.  MRI brain pending.   PROCEDURES:  Critical Care performed: No  Procedures   MEDICATIONS ORDERED IN ED: Medications  aspirin chewable tablet 324 mg (has no administration in time range)  sodium chloride 0.9 % bolus 1,000 mL (0 mLs Intravenous Stopped 08/01/21 1957)  iohexol (OMNIPAQUE) 300 MG/ML solution 100 mL (100 mLs Intravenous Contrast Given 08/01/21 1710)  LORazepam (ATIVAN) injection 1 mg (1 mg Intravenous Given 08/01/21 2113)     IMPRESSION / MDM / ASSESSMENT AND PLAN / ED COURSE  I reviewed the triage vital signs and the nursing notes.                              Differential diagnosis includes, but is not limited to, polypharmacy/sedative side effects, UTI, pneumonia, viral illness, stroke, cystitis, electrolyte abnormality     Patient presents with acute confusion.  High suspicion for side effects from her multiple medications being used to manage postop pain.  COVID and flu  are negative, serum lab panel unremarkable except for mild hyponatremia which I think is unlikely to be the source of this degree of confusion.  CT scans unremarkable.  MRI brain currently in process to evaluate for ischemic stroke.   Clinical Course as of 08/01/21 2326  Thu Aug 01, 2021  2325 MRI shows acute ischemic stroke with the anterior corpus callosum.  Patient currently sleeping.  Plan for stroke sequela strain, additional aspirin, admission for further stroke work-up and management. [PS]    Clinical Course User Index [PS] Carrie Mew, MD     FINAL CLINICAL IMPRESSION(S) / ED DIAGNOSES   Final diagnoses:  Confusion  Acute ischemic stroke [facility])     Rx / DC Orders   ED Discharge Orders     None        Note:  This document was prepared using Dragon voice recognition software and may include unintentional dictation errors.   Carrie Mew, MD 08/01/21 Tawnya Crook    Carrie Mew, MD 08/01/21 2326

## 2021-08-01 NOTE — ED Notes (Signed)
Patient transported to CT 

## 2021-08-02 ENCOUNTER — Inpatient Hospital Stay: Payer: Medicare HMO

## 2021-08-02 ENCOUNTER — Encounter: Payer: Self-pay | Admitting: Internal Medicine

## 2021-08-02 ENCOUNTER — Inpatient Hospital Stay
Admit: 2021-08-02 | Discharge: 2021-08-02 | Disposition: A | Discharge status: Home / Self Care | Payer: Medicare HMO | Attending: Internal Medicine | Admitting: Internal Medicine

## 2021-08-02 DIAGNOSIS — R410 Disorientation, unspecified: Secondary | ICD-10-CM

## 2021-08-02 DIAGNOSIS — I639 Cerebral infarction, unspecified: Secondary | ICD-10-CM | POA: Diagnosis present

## 2021-08-02 DIAGNOSIS — R600 Localized edema: Secondary | ICD-10-CM | POA: Diagnosis present

## 2021-08-02 LAB — ECHOCARDIOGRAM COMPLETE
AR max vel: 2.84 cm2
AV Area VTI: 3.08 cm2
AV Area mean vel: 2.79 cm2
AV Mean grad: 4 mmHg
AV Peak grad: 8.4 mmHg
Ao pk vel: 1.45 m/s
Area-P 1/2: 4.12 cm2
Height: 63 in
MV VTI: 2.87 cm2
S' Lateral: 2.35 cm
Weight: 3008 oz

## 2021-08-02 LAB — LIPID PANEL
Cholesterol: 125 mg/dL (ref 0–200)
HDL: 47 mg/dL (ref >40)
LDL Cholesterol: 66 mg/dL (ref 0–99)
Total CHOL/HDL Ratio: 2.7 RATIO
Triglycerides: 61 mg/dL (ref <150)
VLDL: 12 mg/dL (ref 0–40)

## 2021-08-02 LAB — T4, FREE: Free T4: 0.93 ng/dL (ref 0.61–1.12)

## 2021-08-02 LAB — HEMOGLOBIN A1C
Hgb A1c MFr Bld: 5.5 % (ref 4.8–5.6)
Mean Plasma Glucose: 111 mg/dL

## 2021-08-02 LAB — TSH: TSH: 1.571 u[IU]/mL (ref 0.350–4.500)

## 2021-08-02 LAB — VITAMIN B12: Vitamin B-12: 217 pg/mL (ref 180–914)

## 2021-08-02 IMAGING — CT CT ANGIO HEAD-NECK (W OR W/O PERF)
1 of 11 series · 5 of 33 positions shown · non-contrast
Comparison: No prior CTA, correlation is made with CT head
[DATE], MRI head [DATE], and CT neck [DATE]

CLINICAL DATA: Stroke suspected, follow-up

EXAM:
CT ANGIOGRAPHY HEAD AND NECK
TECHNIQUE: Multidetector CT imaging of the head and neck was performed using
the standard protocol during bolus administration of intravenous
contrast. Multiplanar CT image reconstructions and MIPs were
obtained to evaluate the vascular anatomy. Carotid stenosis
measurements (when applicable) are obtained utilizing NASCET
criteria, using the distal internal carotid diameter as the
denominator.

[Series 10: ax thin · axial · 0.53mm/px · z∈[+106,+328]mm · 5 of 347 slices shown]
[im 58/347  soft-tissue]
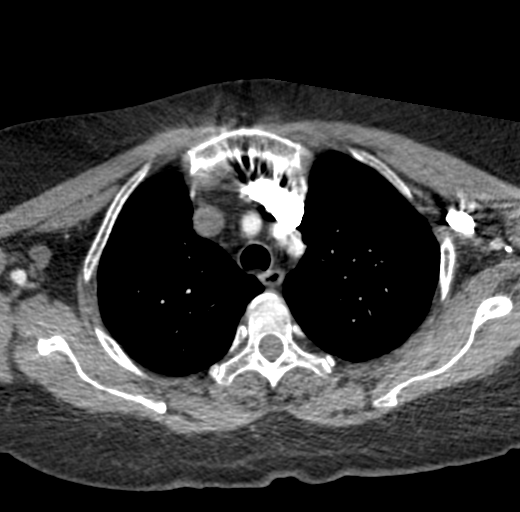
[im 116/347  bone]
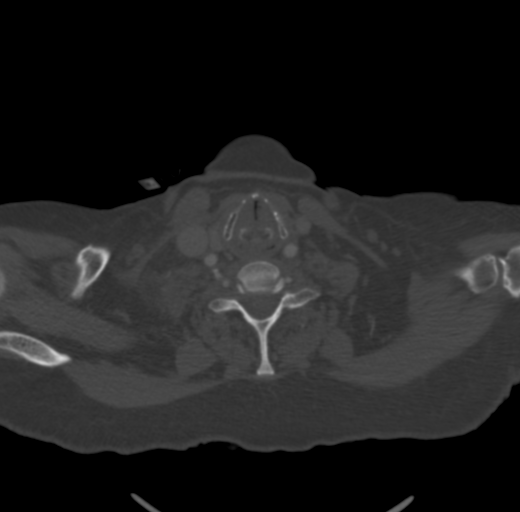
[im 174/347  soft-tissue]
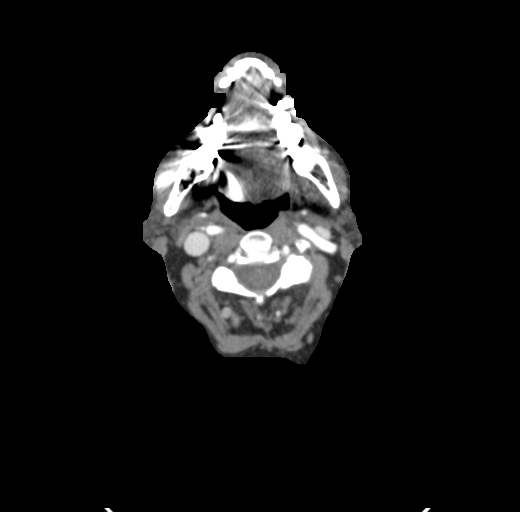
[im 231/347  bone]
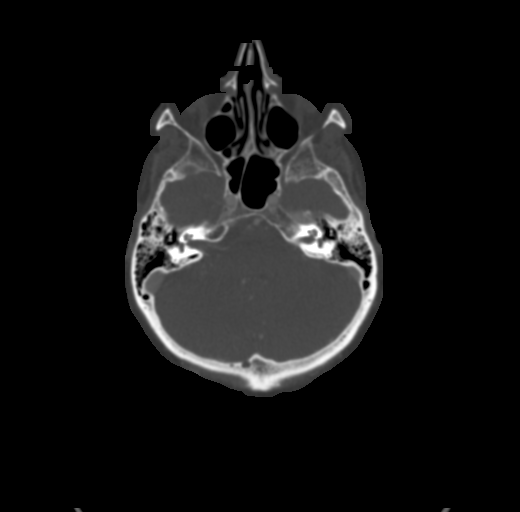
[im 289/347  soft-tissue]
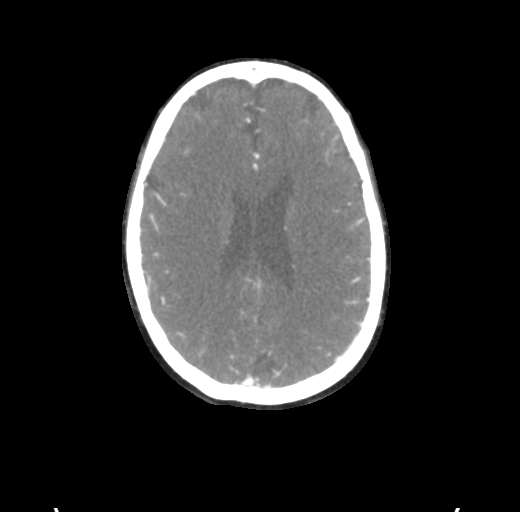

[5 of 33 positions shown; findings below may reference images not displayed]

RADIATION DOSE REDUCTION: This exam was performed according to the
departmental dose-optimization program which includes automated
exposure control, adjustment of the mA and/or kV according to
patient size and/or use of iterative reconstruction technique.

CONTRAST:  75mL OMNIPAQUE IOHEXOL 350 MG/ML SOLN
FINDINGS: CT HEAD FINDINGS

Brain: Mild focal hypodensity in the anterior left corpus callosum,
which correlates with the restricted diffusion seen on the
[DATE] MRI. No other new hypodensity to suggest additional acute
infarct. No acute hemorrhage, mass, mass effect, or midline shift.
No hydrocephalus or extra-axial collection.

Vascular: Please see CTA findings below

Skull: Normal. Negative for fracture or focal lesion.

Sinuses: Imaged portions are clear.

Orbits: No acute finding.

Review of the MIP images confirms the above findings

CTA NECK FINDINGS

Aortic arch: Standard branching. Imaged portion shows no evidence of
aneurysm or dissection. No significant stenosis of the major arch
vessel origins.

Right carotid system: No evidence of dissection, stenosis (50% or
greater) or occlusion. Retropharyngeal course of the ICA.

Left carotid system: No evidence of dissection, stenosis (50% or
greater) or occlusion.

Vertebral arteries: No evidence of dissection, stenosis (50% or
greater) or occlusion.

Skeleton: No acute osseous abnormality.

Other neck: 2.0 cm hypoenhancing nodule in the posterior right
thyroid lobe, which correlates with the lesion evaluated on the
[DATE] ultrasound and appears similar to the [DATE] CT neck.

Upper chest: Small right pleural effusion with associated
atelectasis. Centrilobular emphysema. No left pleural effusion.

Review of the MIP images confirms the above findings

CTA HEAD FINDINGS

Anterior circulation:

Both internal carotid arteries are patent to the termini, with mild
calcifications but without significant stenosis.

A1 segments patent. Normal anterior communicating artery. Anterior
cerebral arteries are patent to their distal aspects.

No M1 stenosis or occlusion. Normal MCA bifurcations. Distal MCA
branches perfused and symmetric.

Posterior circulation:

Vertebral arteries patent to the vertebrobasilar junction without
stenosis. Basilar patent to its distal aspect. Superior cerebellar
arteries patent bilaterally.

Bilateral P1 segments originate from the basilar artery. Bilateral
posterior communicating arteries are patent. PCAs perfused to their
distal aspects without stenosis.

Venous sinuses: As permitted by contrast timing, patent.

Anatomic variants: None significant

Review of the MIP images confirms the above findings
IMPRESSION: 1. Hypodensity in the anterior left corpus callosum correlates with
the restricted diffusion seen on the [DATE] MRI. No other acute
intracranial process.
2.  No intracranial large vessel occlusion or significant stenosis.
3.  No hemodynamically significant stenosis in the neck.
4. Small right pleural effusion.
5. 1.9 cm nodule in the posterior right thyroid lobe, which
correlates with a lesion last imaged on a [DATE] ultrasound of
the thyroid and appears unchanged compared to the [DATE] CT
neck. Further follow-up with ultrasound could be obtained if
clinically indicated.
6.  Emphysema ([8H]-[8H]).

## 2021-08-02 IMAGING — US US EXTREM LOW VENOUS
1 series · 14 of 24 positions shown · non-contrast
Comparison: None.

CLINICAL DATA: Lower extremity swelling and pain

Recent knee surgery
EXAM:
BILATERAL LOWER EXTREMITY VENOUS DOPPLER ULTRASOUND
TECHNIQUE: Gray-scale sonography with compression, as well as color and duplex
ultrasound, were performed to evaluate the deep venous system(s)
from the level of the common femoral vein through the popliteal and
proximal calf veins.

[Series 1: us extrem low venous · 0.08mm/px · 14 of 68 slices shown]
[im 1/68]
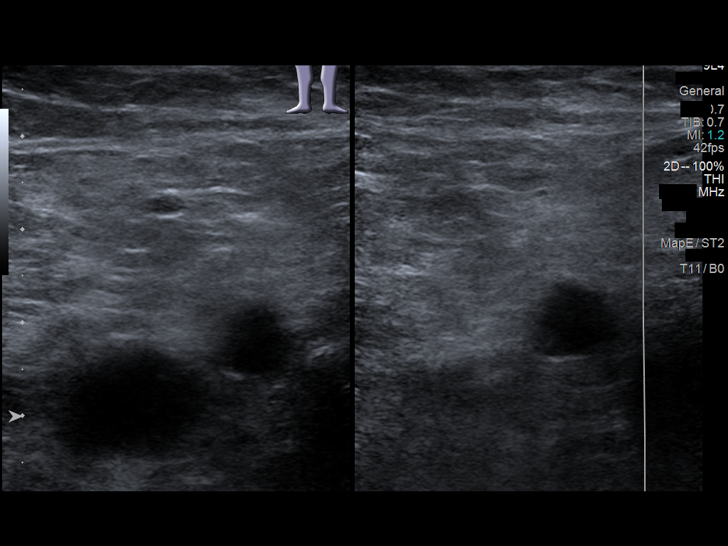
[im 6/68]
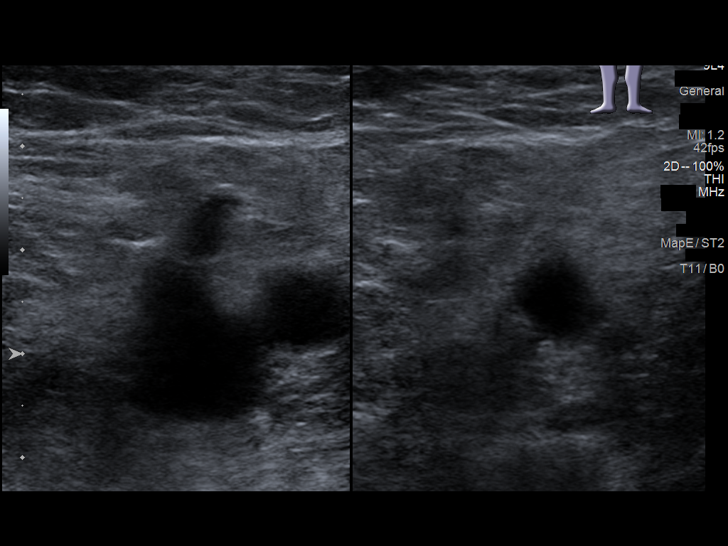
[im 12/68]
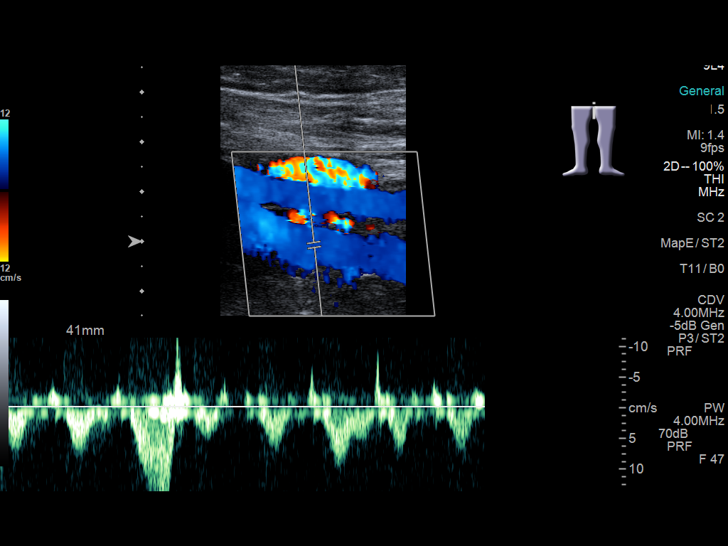
[im 18/68]
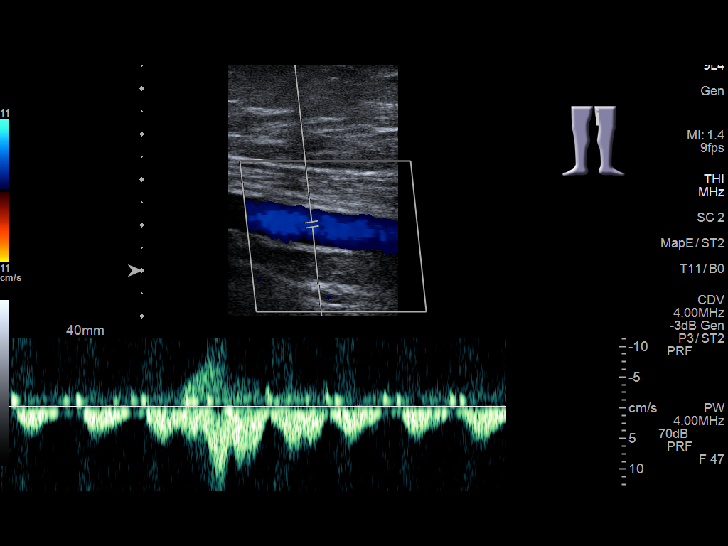
[im 21/68]
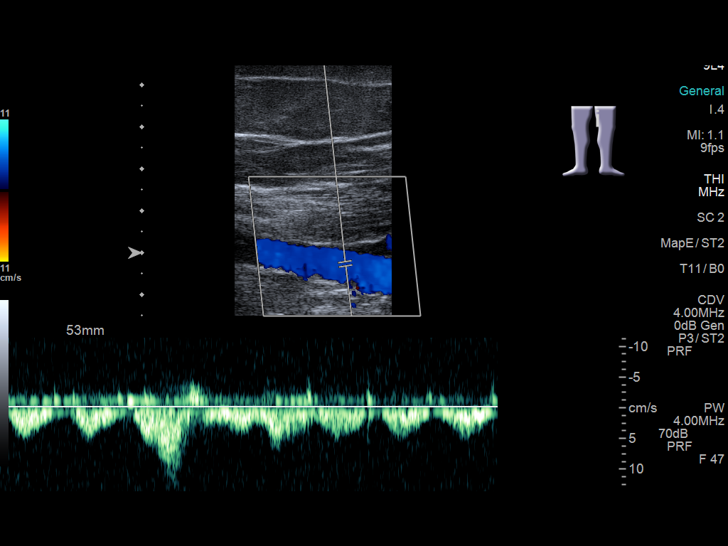
[im 27/68]
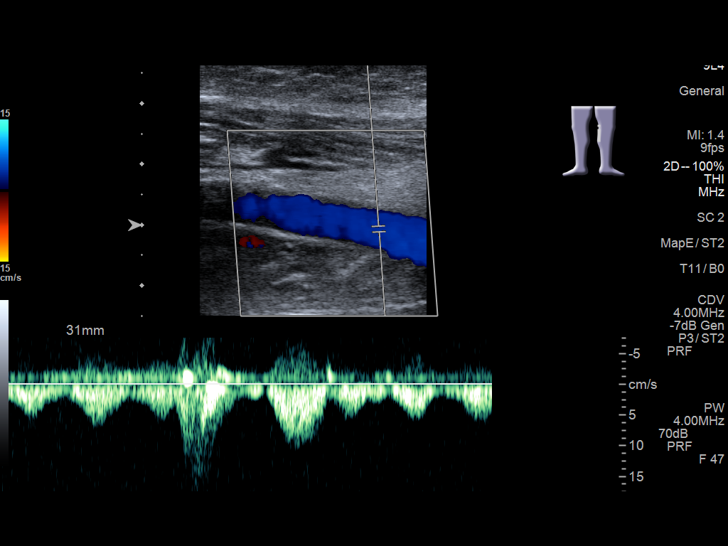
[im 33/68]
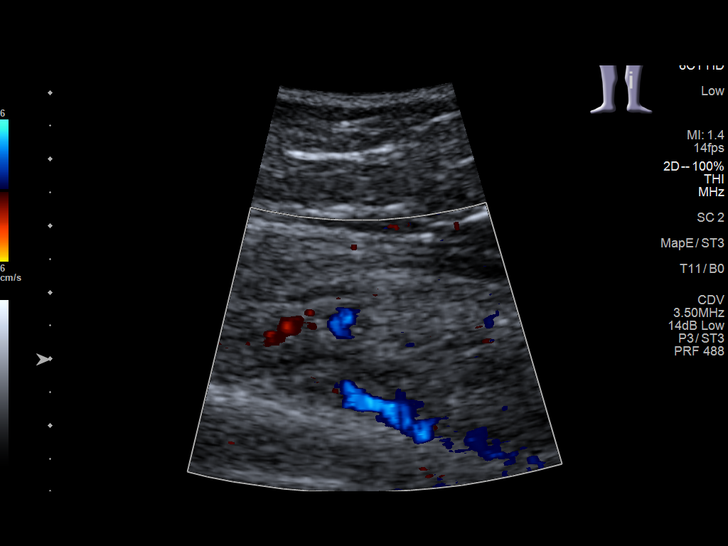
[im 35/68]
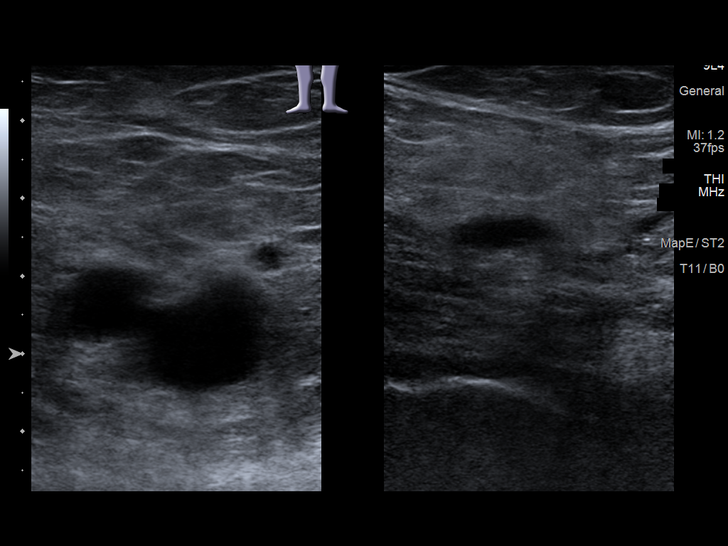
[im 41/68]
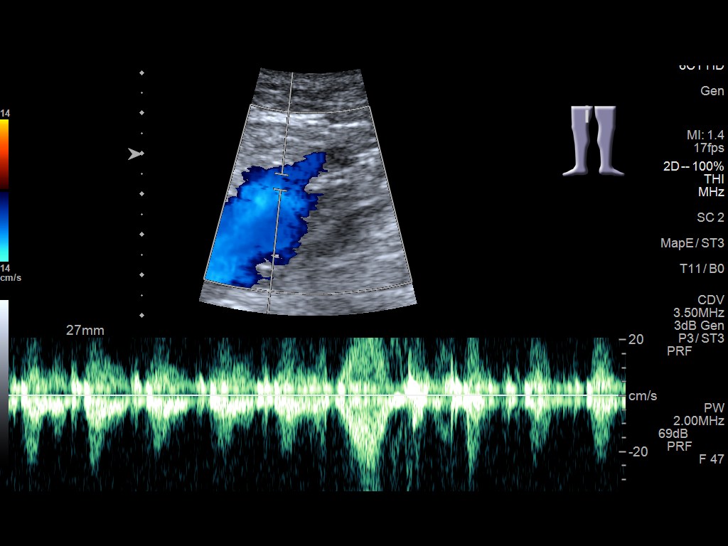
[im 47/68]
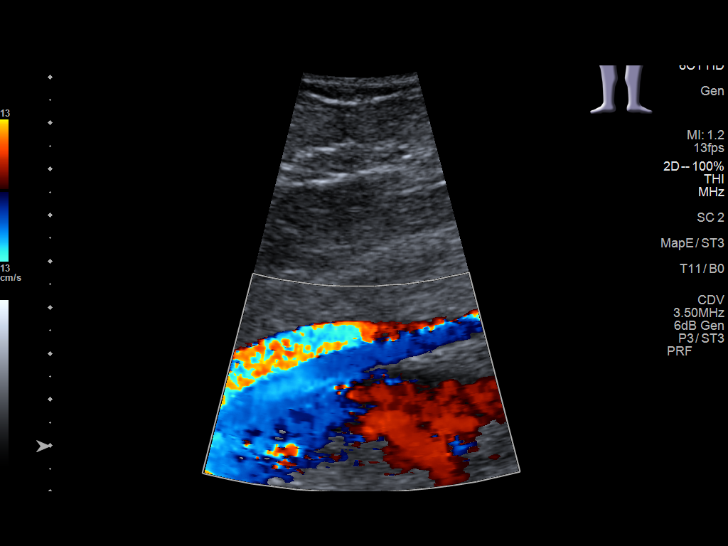
[im 53/68]
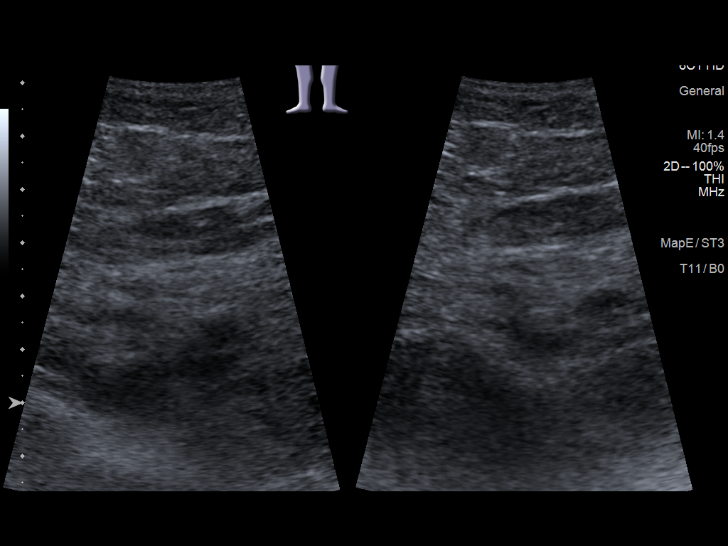
[im 56/68]
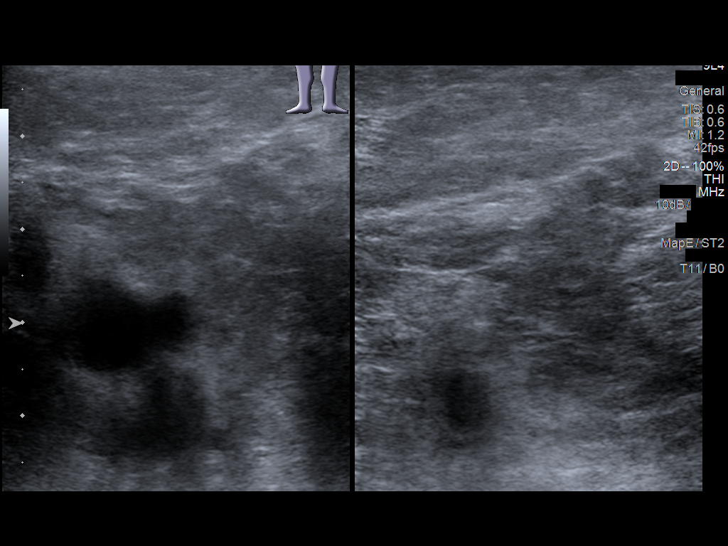
[im 62/68]
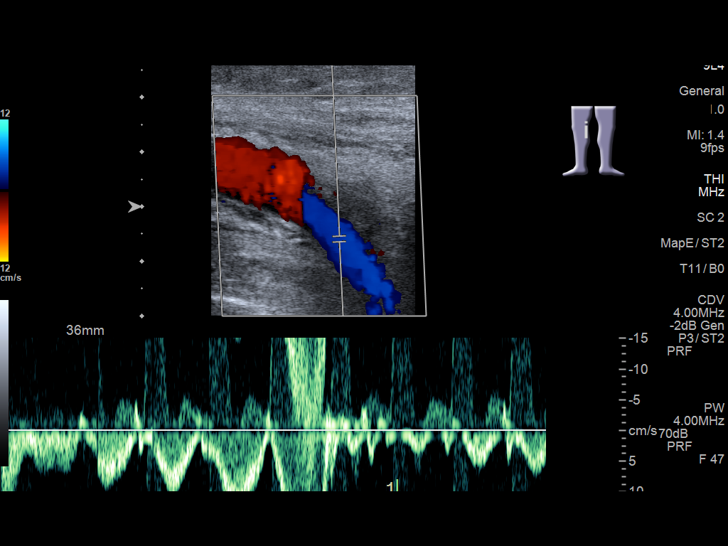
[im 68/68]
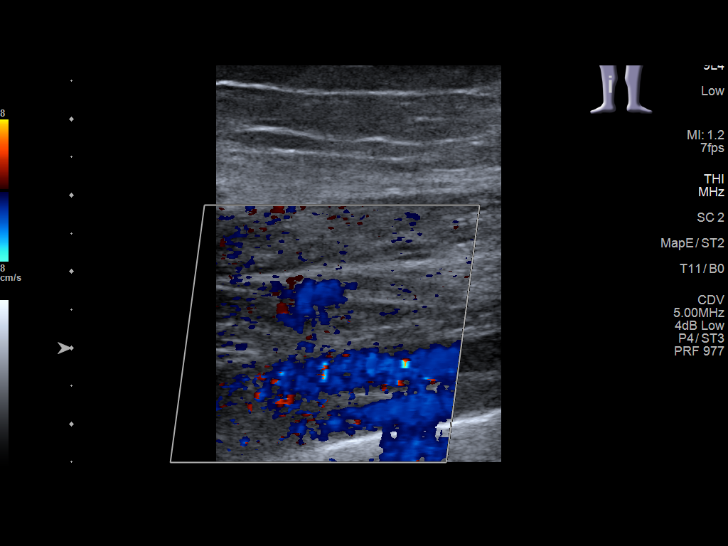

[14 of 24 positions shown; findings below may reference images not displayed]

FINDINGS: VENOUS

Normal compressibility of the common femoral, superficial femoral,
and popliteal veins, as well as the visualized calf veins.
Visualized portions of profunda femoral vein and great saphenous
vein unremarkable. No filling defects to suggest DVT on grayscale or
color Doppler imaging. Doppler waveforms show normal direction of
venous flow, normal respiratory plasticity and response to
augmentation.

OTHER

None.

Limitations: none
IMPRESSION: No lower extremity DVT.

## 2021-08-02 MED ORDER — GABAPENTIN 300 MG PO CAPS: 300.0000 mg | ORAL_CAPSULE | Freq: Three times a day (TID) | ORAL | Status: DC

## 2021-08-02 MED ORDER — ONDANSETRON HCL 4 MG PO TABS: 4.0000 mg | ORAL_TABLET | Freq: Three times a day (TID) | ORAL | Status: DC | PRN

## 2021-08-02 MED ORDER — IOHEXOL 350 MG/ML SOLN
75.0000 mL | Freq: Once | INTRAVENOUS | Status: AC | PRN
  Administered 2021-08-02: 75 mL via INTRAVENOUS

## 2021-08-02 MED ORDER — CLOPIDOGREL BISULFATE 75 MG PO TABS
75.0000 mg | ORAL_TABLET | Freq: Every day | ORAL | Status: DC
  Administered 2021-08-02 – 2021-08-03 (×2): 75 mg via ORAL
  Filled 2021-08-02 (×2): qty 1

## 2021-08-02 MED ORDER — CHLORHEXIDINE GLUCONATE CLOTH 2 % EX PADS
6.0000 | MEDICATED_PAD | Freq: Every day | CUTANEOUS | Status: DC
  Administered 2021-08-02: 14:00:00 6 via TOPICAL

## 2021-08-02 MED ORDER — ENOXAPARIN SODIUM 40 MG/0.4ML IJ SOSY
40.0000 mg | PREFILLED_SYRINGE | INTRAMUSCULAR | Status: DC
  Administered 2021-08-02: 20:00:00 40 mg via SUBCUTANEOUS
  Filled 2021-08-02: qty 0.4

## 2021-08-02 MED ORDER — OXYCODONE HCL 5 MG PO TABS
5.0000 mg | ORAL_TABLET | Freq: Four times a day (QID) | ORAL | Status: DC | PRN
  Administered 2021-08-02: 5 mg via ORAL
  Filled 2021-08-02: qty 1

## 2021-08-02 MED ORDER — ASPIRIN EC 81 MG PO TBEC
81.0000 mg | DELAYED_RELEASE_TABLET | Freq: Every day | ORAL | Status: DC
  Administered 2021-08-03: 81 mg via ORAL
  Filled 2021-08-02: qty 1

## 2021-08-02 MED ORDER — PANTOPRAZOLE SODIUM 40 MG PO TBEC
40.0000 mg | DELAYED_RELEASE_TABLET | Freq: Every day | ORAL | Status: DC
  Administered 2021-08-02: 20:00:00 40 mg via ORAL
  Filled 2021-08-02: qty 1

## 2021-08-02 MED ORDER — TRAMADOL HCL 50 MG PO TABS
50.0000 mg | ORAL_TABLET | Freq: Four times a day (QID) | ORAL | Status: DC | PRN
  Administered 2021-08-02 – 2021-08-03 (×4): 50 mg via ORAL
  Filled 2021-08-02 (×4): qty 1

## 2021-08-02 MED ORDER — GABAPENTIN 300 MG PO CAPS
300.0000 mg | ORAL_CAPSULE | Freq: Three times a day (TID) | ORAL | Status: DC
  Administered 2021-08-02 – 2021-08-03 (×5): 300 mg via ORAL
  Filled 2021-08-02 (×5): qty 1

## 2021-08-02 NOTE — TOC Initial Note (Signed)
Transition of Care [facility]) - Initial/Assessment Note    Patient Details  Name: Alexis Reed MRN: [id number] Date of Birth: [birth year]  [facility] CM/SW Contact:    Pete Pelt, RN Phone Number: 08/02/2021, 4:12 PM  Clinical Narrative:   Spoke to patient's daughter, who is on staff with [facility].  Patient lives with spouse, who assists as necessary, as well as daughters.    Patient can transport to appointments and pharmacy and is able to take medications as directed.    [facility] will take patient for PT OT and possibly other services as needed.  TOC contact information provided, TOC to follow.                Expected Discharge Plan: [place] Barriers to Discharge: Continued Medical Work up   Patient Goals and CMS Choice        Expected Discharge Plan and Services Expected Discharge Plan: [place] Choice: [place] arrangements for the past 2 months: Single Family Home                   DME Agency: [employer] Date DME Agency Contacted: 08/02/21 Time DME Agency Contacted: [phone] Representative spoke with at DME Agency: rhonda, to be delivered on discharge HH Arranged: PT, OT [place] Agency: [place] Date [place]: 08/02/21 Time HH Agency Contacted: 28 Representative spoke with at [place]: Malachy Mood  Prior Living Arrangements/Services Living arrangements for the past 2 months: [place] with:: Self, Spouse Patient language and need for interpreter reviewed:: Yes (spoke to daughter) Do you feel safe going back to the place where you live?: Yes      Need for Family Participation in Patient Care: Yes (Comment) Care giver support system in place?: Yes (comment)   Criminal Activity/Legal Involvement Pertinent to Current Situation/Hospitalization: No - Comment as needed  Activities of Daily Living Home Assistive Devices/Equipment: Walker  (specify type) ADL Screening (condition at time of admission) Patient's cognitive ability adequate to safely complete daily activities?: No Is the patient deaf or have difficulty hearing?: No Does the patient have difficulty seeing, even when wearing glasses/contacts?: No Does the patient have difficulty concentrating, remembering, or making decisions?: Yes Patient able to express need for assistance with ADLs?: Yes Does the patient have difficulty dressing or bathing?: Yes Independently performs ADLs?: No Communication: Independent Dressing (OT): Needs assistance Is this a change from baseline?: Pre-admission baseline Grooming: Needs assistance Is this a change from baseline?: Pre-admission baseline Feeding: Independent Bathing: Needs assistance Is this a change from baseline?: Pre-admission baseline Toileting: Needs assistance Is this a change from baseline?: Pre-admission baseline In/Out Bed: Needs assistance Is this a change from baseline?: Pre-admission baseline Walks in Home: Needs assistance Is this a change from baseline?: Pre-admission baseline Does the patient have difficulty walking or climbing stairs?: Yes Weakness of Legs: Right Weakness of Arms/Hands: None  Permission Sought/Granted Permission sought to share information with : Case Manager Permission granted to share information with : Yes, Verbal Permission Granted              Emotional Assessment         Alcohol / Substance Use: Not Applicable Psych Involvement: No (comment)  Admission diagnosis:  Swelling [R60.9] Confusion [R41.0] Acute ischemic stroke ([place]) [I63.9] Acute CVA (cerebrovascular accident) ([place]) [I63.9] AMS (altered mental status) [R41.82] Patient Active Problem List   Diagnosis Date  Noted   Edema of right lower leg 08/02/2021   Acute CVA (cerebrovascular accident) ([place]) 08/02/2021   AMS (altered mental status) 08/01/2021   Pre-op evaluation 07/24/2021   Lung nodules 07/24/2021    Dyspepsia    Burping    Polyp of cecum    Polyp of ascending colon    Vitamin D deficiency 05/11/2021   Increased heart rate 05/10/2021   Breast cancer screening 01/07/2021   Ear anomaly 07/18/2020   Complex tear of medial meniscus of right knee as current injury 06/22/2020   Breast nodule 06/10/2020   Thyroid nodule 05/29/2020   Asymmetry of clavicles 05/07/2020   Complex tear of lateral meniscus of right knee as current injury 04/30/2020   Primary osteoarthritis of right knee 04/30/2020   Leg wound, right 01/16/2020   Laceration of skin of knee 01/10/2020   Palpitations 10/10/2019   Breast pain 10/10/2019   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   Abdominal pain 08/22/2018   History of colonic polyps 02/04/2016   Personal history of tobacco use, presenting hazards to health 08/07/2015   Health care maintenance 11/18/2014   Breast calcifications on mammogram 10/18/2014   Obesity 07/23/2014   COPD (chronic obstructive pulmonary disease) ([place]) 04/26/2014   Tobacco use disorder 04/26/2014   Abnormal mammogram 04/12/2014   UTI (urinary tract infection) 04/02/2014   SOB (shortness of breath) 02/25/2014   Stress 02/25/2014   Internal hemorrhoids without complication 34/19/6222   Osteoporosis 06/18/2012   GERD (gastroesophageal reflux disease) 06/18/2012   Hypercholesterolemia 06/18/2012   PCP:  Einar Pheasant, MD Pharmacy:   [facility], [address] [place] [address] Phone: [phone] Fax: [phone]     Social Determinants of Health (SDOH) Interventions    Readmission Risk Interventions No flowsheet data found.

## 2021-08-02 NOTE — Evaluation (Signed)
Occupational Therapy Evaluation Patient Details Name: Alexis Reed MRN: [id number] DOB: [birth year] Today's Date: 08/02/2021   History of Present Illness Pt is a 76 y/o F admitted on 08/01/21 with c/c of confusion. Pt had a R TKA on Tuesday & began having confusion & weakness Wednesday PM with pt unable to recall surgery.  MRI showed abnormal restricted diffusion involving the central and left aspect of the anterior genu of the corpus callosum, with extension to involve the fornices bilaterally. Finding likely reflects changes of an acute ACA distribution infarct. PMH: arthritis, COPD, osteoporosis   Clinical Impression   Ms. Bechler was seen for OT evaluation this date. Prior to her TKA surgery, pt was active and independent with ADL/IADL management. Pt lives with her spouse in a 1 level home with a level entry. Pt seen with supportive family at bedside who confirm pt is not yet back to her baseline level of cognition. She remains pleasantly confused t/o session, but is able to consistently follow 1 Step VCs. She is noted to have min difficulty with sequencing and STM deficits t/o session. Per daughter cognition seems to vary throughout the day. Currently pt demonstrates impairments as described below (See OT problem list) which functionally limit her ability to perform ADL/self-care tasks. Pt currently requires SUPERVISION-MIN GUARD for bed mobility, functional mobility, and standing grooming at room sink. She requires MOD A for LB dressing 2/2 RLE pain and decreased AROM. MAX A to doff/don compression stockings.  Pt would benefit from skilled OT services to address noted impairments and functional limitations (see below for any additional details) in order to maximize safety and independence while minimizing falls risk and caregiver burden. Upon hospital discharge, recommend HHOT to maximize pt safety and return to functional independence during meaningful occupations of daily life.         Recommendations for follow up therapy are one component of a multi-disciplinary discharge planning process, led by the attending physician.  Recommendations may be updated based on patient status, additional functional criteria and insurance authorization.   Follow Up Recommendations  Home health OT    Assistance Recommended at Discharge Frequent or constant Supervision/Assistance  Patient can return home with the following A little help with walking and/or transfers;A little help with bathing/dressing/bathroom;Assistance with cooking/housework;Direct supervision/assist for medications management;Help with stairs or ramp for entrance    Functional Status Assessment  Patient has had a recent decline in their functional status and demonstrates the ability to make significant improvements in function in a reasonable and predictable amount of time.  Equipment Recommendations  BSC/3in1    Recommendations for Other Services       Precautions / Restrictions Precautions Precautions: Fall Restrictions Weight Bearing Restrictions: Yes RLE Weight Bearing: Weight bearing as tolerated      Mobility Bed Mobility Overal bed mobility: Needs Assistance Bed Mobility: Supine to Sit     Supine to sit: Supervision, HOB elevated     General bed mobility comments: extra time    Transfers Overall transfer level: Needs assistance Equipment used: Rolling walker (2 wheels) Transfers: Sit to/from Stand Sit to Stand: Min guard     Step pivot transfers: Supervision, Min guard     General transfer comment: cuing for safe hand/foot placement and RW use during session.      Balance Overall balance assessment: Needs assistance Sitting-balance support: Feet supported, No upper extremity supported Sitting balance-Leahy Scale: Good     Standing balance support: During functional activity, Single extremity supported, No upper extremity supported  Standing balance-Leahy Scale: Fair Standing  balance comment: Steady static stainding at sink during oral care. Generally relies on 1 UE support during ADL management.                           ADL either performed or assessed with clinical judgement   ADL Overall ADL's : Needs assistance/impaired                                       General ADL Comments: Pt is functionally limited by decreased strength/AROM of her RLE, decreased RUE coordination, and impaired cognition. She requires close supervision for safety during functional mobility, standing grooming, and bed mobility.     Vision Patient Visual Report: No change from baseline       Perception     Praxis      Pertinent Vitals/Pain Pain Assessment Pain Assessment: 0-10 Pain Score: 2  Pain Location: R knee Pain Descriptors / Indicators: Discomfort, Sore Pain Intervention(s): Limited activity within patient's tolerance, Monitored during session, Premedicated before session     Hand Dominance Right   Extremity/Trunk Assessment Upper Extremity Assessment Upper Extremity Assessment: RUE deficits/detail RUE Deficits / Details: Overall strength/coordination is WFL, however, deficits appreciated with functional activity including notably difficulty with grading movements during teeth brushing & difficulty handling small items (i.e. pills). RUE Coordination: decreased fine motor   Lower Extremity Assessment Lower Extremity Assessment: RLE deficits/detail;Defer to PT evaluation RLE Deficits / Details: s/p R TKA, WBAT   Cervical / Trunk Assessment Cervical / Trunk Assessment: Normal   Communication Communication Communication: No difficulties   Cognition Arousal/Alertness: Awake/alert   Overall Cognitive Status: Impaired/Different from baseline Area of Impairment: Orientation, Attention, Following commands, Awareness, Memory, Problem solving                 Orientation Level: Disoriented to, Time, Situation   Memory: Decreased  short-term memory Following Commands: Follows one step commands consistently, Follows multi-step commands inconsistently   Awareness: Emergent   General Comments: Per family at bedside, pt still not back to baseline leve of functional independence. Cognition remains variable with decreases STM and mild confusion.     General Comments  Pt received on 2L/min via nasal cannula, Pt placed on room air & SpO2 >/= 90% throughout. Pt left on room air.    Exercises Other Exercises Other Exercises: Pt/caregivers (multiple famly members present at bedside and over the phone t/o session) educated on role of OT in acute setting, safe use of AE/DME for ADL management, compression stocking management, compensatory strategies for inclusion of RUE into functional activity, and routines modifications to support safety and functional independnece during ADL management. OT facilitated standing grooming task during session.   Shoulder Instructions      Home Living Family/patient expects to be discharged to:: Private residence Living Arrangements: Spouse/significant other Available Help at Discharge: Family;Available 24 hours/day Type of Home: House Home Access: Level entry     Home Layout: One level     Bathroom Shower/Tub: Occupational psychologist: Standard     Home Equipment: Conservation officer, nature (2 wheels);Shower seat - built in;Hand held shower head          Prior Functioning/Environment Prior Level of Function : Independent/Modified Independent             Mobility Comments: Pt was independent without AD prior to  TKA, had been using RW since surgery. Family assisting with gait since surgery. ADLs Comments: Per daughters, pt totally independent with all ADL/IADL management priot to surgery.        OT Problem List: Decreased strength;Decreased coordination;Decreased activity tolerance;Decreased safety awareness;Impaired balance (sitting and/or standing);Decreased knowledge of use  of DME or AE;Decreased cognition;Impaired UE functional use      OT Treatment/Interventions: Self-care/ADL training;Therapeutic exercise;Therapeutic activities;DME and/or AE instruction;Patient/family education;Balance training;Energy conservation;Cognitive remediation/compensation;Neuromuscular education    OT Goals(Current goals can be found in the care plan section) Acute Rehab OT Goals Patient Stated Goal: "To walk" OT Goal Formulation: With patient Time For Goal Achievement: 08/16/21 Potential to Achieve Goals: Good ADL Goals Pt Will Perform Grooming: with modified independence;standing Pt Will Perform Lower Body Dressing: sit to/from stand;with set-up;with supervision;with caregiver independent in assisting Pt Will Transfer to Toilet: bedside commode;regular height toilet;ambulating;with modified independence Pt Will Perform Toileting - Clothing Manipulation and hygiene: with modified independence;sit to/from stand  OT Frequency: Min 3X/week    Co-evaluation              AM-PAC OT "6 Clicks" Daily Activity     Outcome Measure Help from another person eating meals?: A Little Help from another person taking care of personal grooming?: A Little Help from another person toileting, which includes using toliet, bedpan, or urinal?: A Little Help from another person bathing (including washing, rinsing, drying)?: A Little Help from another person to put on and taking off regular upper body clothing?: A Little Help from another person to put on and taking off regular lower body clothing?: A Little 6 Click Score: 18   End of Session Equipment Utilized During Treatment: Gait belt;Rolling walker (2 wheels) Nurse Communication: Mobility status  Activity Tolerance: Patient tolerated treatment well Patient left: in bed;with call bell/phone within reach;with bed alarm set;with family/visitor present  OT Visit Diagnosis: Other abnormalities of gait and mobility (R26.89);Muscle weakness  (generalized) (M62.81)                Time: 1425-1510 OT Time Calculation (min): 45 min Charges:  OT General Charges $OT Visit: 1 Visit OT Evaluation $OT Eval Moderate Complexity: 1 Mod OT Treatments $Self Care/Home Management : 23-37 mins  Shara Blazing, M.S., OTR/L [employer] - [facility] Ascom: [phone] 08/02/21, 3:47 PM

## 2021-08-02 NOTE — Progress Notes (Signed)
*  PRELIMINARY RESULTS* Echocardiogram 2D Echocardiogram has been performed.  Alexis Reed 08/02/2021, 2:13 PM

## 2021-08-02 NOTE — Evaluation (Signed)
Physical Therapy Evaluation Patient Details Name: Alexis Reed MRN: [id number] DOB: 1945/07/30 Today's Date: 08/02/2021  History of Present Illness  Pt is a 76 y/o F admitted on 08/01/21 with c/c of confusion. Pt had a R TKA on Tuesday & began having confusion & weakness Wednesday PM with pt unable to recall surgery.  MRI showed abnormal restricted diffusion involving the central and left aspect of the anterior genu of the corpus callosum, with extension to involve the fornices bilaterally. Finding likely reflects changes of an acute ACA distribution infarct. PMH: arthritis, COPD, osteoporosis  Clinical Impression  Pt seen for PT evaluation with daughters present for session. Pt demonstrates impaired cognition/memory but some retention of information during session. Pt does not demonstrate any glaring deficits following CVA but some fine motor deficits. Pt is also limited by recent R TKA but is able to ambulate to door & back of room with RW & min assist. PT educates pt on risks of another stroke & importance of diet, exercise, & taking medications as prescribed by MD. Pt tolerated session on room air as well. At this time, recommend HHPT f/u upon d/c. Will continue to follow pt acutely to progress gait with LRAD, balance, endurance, strength & ROM.     Recommendations for follow up therapy are one component of a multi-disciplinary discharge planning process, led by the attending physician.  Recommendations may be updated based on patient status, additional functional criteria and insurance authorization.  Follow Up Recommendations Home health PT    Assistance Recommended at Discharge Frequent or constant Supervision/Assistance  Patient can return home with the following  A little help with walking and/or transfers;A little help with bathing/dressing/bathroom;Assistance with cooking/housework;Direct supervision/assist for financial management;Assist for transportation;Help with stairs or ramp for  entrance;Direct supervision/assist for medications management    Equipment Recommendations BSC/3in1 (shower chair)  Recommendations for Other Services       Functional Status Assessment Patient has had a recent decline in their functional status and demonstrates the ability to make significant improvements in function in a reasonable and predictable amount of time.     Precautions / Restrictions Precautions Precautions: Fall Restrictions Weight Bearing Restrictions: Yes RLE Weight Bearing: Weight bearing as tolerated      Mobility  Bed Mobility Overal bed mobility: Needs Assistance Bed Mobility: Supine to Sit     Supine to sit: Supervision, HOB elevated     General bed mobility comments: extra time    Transfers Overall transfer level: Needs assistance Equipment used: Rolling walker (2 wheels) Transfers: Sit to/from Stand, Bed to chair/wheelchair/BSC Sit to Stand: Min assist   Step pivot transfers: Mod assist (with RW, decreased balance & safety with transfer requiring mod assist overall)       General transfer comment: cuing for safe hand placement    Ambulation/Gait Ambulation/Gait assistance: Min assist Gait Distance (Feet): 25 Feet Assistive device: Rolling walker (2 wheels) Gait Pattern/deviations: Decreased step length - left, Decreased step length - right, Decreased stride length, Decreased weight shift to right Gait velocity: decreased        Stairs            Wheelchair Mobility    Modified Rankin (Stroke Patients Only)       Balance Overall balance assessment: Needs assistance Sitting-balance support: Feet supported, Bilateral upper extremity supported Sitting balance-Leahy Scale: Fair     Standing balance support: Bilateral upper extremity supported, During functional activity, Reliant on assistive device for balance Standing balance-Leahy Scale: Poor  Pertinent Vitals/Pain Pain  Assessment Pain Assessment: Faces Faces Pain Scale: Hurts a little bit Pain Location: HA Pain Descriptors / Indicators: Headache Pain Intervention(s): RN gave pain meds during session    Home Living Family/patient expects to be discharged to:: Private residence Living Arrangements: Spouse/significant other Available Help at Discharge: Family;Available 24 hours/day Type of Home: House Home Access: Level entry       Home Layout: One level Home Equipment: Conservation officer, nature (2 wheels)      Prior Function               Mobility Comments: Pt was independent without AD prior to TKA, had been using RW since surgery. Family assisting with gait since surgery.       Hand Dominance   Dominant Hand: Right    Extremity/Trunk Assessment   Upper Extremity Assessment Upper Extremity Assessment: Overall WFL for tasks assessed    Lower Extremity Assessment Lower Extremity Assessment:  (recent R TKA, 2/5 knee extension in sitting, LLE heel to shin coordination intact, BLE sensation (to light touch) & proprioception intact.)    Cervical / Trunk Assessment Cervical / Trunk Assessment: Normal  Communication   Communication: No difficulties  Cognition Arousal/Alertness: Awake/alert   Overall Cognitive Status: Impaired/Different from baseline Area of Impairment: Orientation, Attention, Following commands, Awareness, Memory, Problem solving                 Orientation Level: Situation, Disoriented to   Memory: Decreased short-term memory Following Commands: Follows one step commands consistently   Awareness: Emergent, Anticipatory            General Comments General comments (skin integrity, edema, etc.): Pt received on 2L/min via nasal cannula, Pt placed on room air & SpO2 >/= 90% throughout. Pt left on room air.    Exercises Total Joint Exercises Long Arc Quad: AROM, Strengthening, Right, 5 reps, Seated   Assessment/Plan    PT Assessment Patient needs continued  PT services  PT Problem List Decreased strength;Decreased mobility;Decreased safety awareness;Decreased range of motion;Decreased knowledge of precautions;Decreased activity tolerance;Decreased cognition;Decreased skin integrity;Cardiopulmonary status limiting activity;Pain;Decreased knowledge of use of DME;Decreased balance       PT Treatment Interventions DME instruction;Therapeutic exercise;Gait training;Balance training;Stair training;Neuromuscular re-education;Functional mobility training;Therapeutic activities;Patient/family education;Cognitive remediation;Modalities;Manual techniques    PT Goals (Current goals can be found in the Care Plan section)  Acute Rehab PT Goals Patient Stated Goal: get better PT Goal Formulation: With patient/family Time For Goal Achievement: 08/16/21 Potential to Achieve Goals: Fair    Frequency 7X/week     Co-evaluation               AM-PAC PT "6 Clicks" Mobility  Outcome Measure Help needed turning from your back to your side while in a flat bed without using bedrails?: None Help needed moving from lying on your back to sitting on the side of a flat bed without using bedrails?: A Little Help needed moving to and from a bed to a chair (including a wheelchair)?: A Little Help needed standing up from a chair using your arms (e.g., wheelchair or bedside chair)?: A Little Help needed to walk in hospital room?: A Little Help needed climbing 3-5 steps with a railing? : A Lot 6 Click Score: 18    End of Session Equipment Utilized During Treatment: Gait belt Activity Tolerance: Patient tolerated treatment well Patient left: in chair;with chair alarm set;with call bell/phone within reach;with family/visitor present Nurse Communication: Mobility status PT Visit Diagnosis: Unsteadiness on feet (R26.81);Muscle weakness (  generalized) (M62.81);Difficulty in walking, not elsewhere classified (R26.2)    Time: 8616-8372 PT Time Calculation (min) (ACUTE  ONLY): 27 min   Charges:   PT Evaluation $PT Eval High Complexity: 1 High PT Treatments $Therapeutic Activity: 8-22 mins        Lavone Nian, PT, DPT 08/02/21, 12:34 PM   Waunita Schooner 08/02/2021, 12:32 PM

## 2021-08-02 NOTE — Plan of Care (Signed)

## 2021-08-02 NOTE — Plan of Care (Signed)
Patient admitted to unit accompanied by daughter. Patient is alert to person and place. Ice applied to right knee. Pain medications ordered per daughters request. Patient is currently sleeping comfortably. All needs addressed.  Problem: Education: Goal: Knowledge of General Education information will improve Description: Including pain rating scale, medication(s)/side effects and non-pharmacologic comfort measures Outcome: Progressing   Problem: Health Behavior/Discharge Planning: Goal: Ability to manage health-related needs will improve Outcome: Progressing   Problem: Clinical Measurements: Goal: Ability to maintain clinical measurements within normal limits will improve Outcome: Progressing Goal: Will remain free from infection Outcome: Progressing Goal: Diagnostic test results will improve Outcome: Progressing Goal: Respiratory complications will improve Outcome: Progressing Goal: Cardiovascular complication will be avoided Outcome: Progressing   Problem: Activity: Goal: Risk for activity intolerance will decrease Outcome: Progressing   Problem: Nutrition: Goal: Adequate nutrition will be maintained Outcome: Progressing   Problem: Coping: Goal: Level of anxiety will decrease Outcome: Progressing   Problem: Elimination: Goal: Will not experience complications related to bowel motility Outcome: Progressing Goal: Will not experience complications related to urinary retention Outcome: Progressing   Problem: Pain Managment: Goal: General experience of comfort will improve Outcome: Progressing   Problem: Safety: Goal: Ability to remain free from injury will improve Outcome: Progressing   Problem: Skin Integrity: Goal: Risk for impaired skin integrity will decrease Outcome: Progressing

## 2021-08-02 NOTE — Progress Notes (Signed)
SLP Cancellation Note  Patient Details Name: Alexis Reed MRN: [id number] DOB: [birth year]   Cancelled treatment:       Reason Eval/Treat Not Completed: Patient at procedure or test/unavailable (chart reviewed).  Pt is remains off the floor at [place]. Per chart notes, pt had a change in neuro status and was brought to the ED. MRI last night revealed "Finding likely reflects changes of an acute ACA distribution infarct. No associated hemorrhage or significant mass effect.".  ST services will f/u w/ Cognitive-linguistic evaluation this PM or tomorrow.      Orinda Kenner, MS, CCC-SLP Speech Language Pathologist Rehab Services; [place] [facility] [phone] (ascom) Tristina Sahagian 08/02/2021, 12:20 PM

## 2021-08-02 NOTE — Progress Notes (Addendum)
PROGRESS NOTE    Alexis Reed  UJW:[id number] DOB: Nov 30, 1945 DOA: 08/01/2021 PCP: Einar Pheasant, MD  [id number]/[id number]-[id number]   Assessment & Plan:   Principal Problem:   AMS (altered mental status) Active Problems:   GERD (gastroesophageal reflux disease)   COPD (chronic obstructive pulmonary disease) ([place])   Edema of right lower leg   Acute CVA (cerebrovascular accident) ([place])   Alexis Reed is a 76 y.o. female seen in ed with complaints of AMS Right TKA on Tuesday and wed pm started confusion and weakness. Pt has been sleeping and decreased urine output per daughter.  Confusion since Wednesday and is not able to answer questions and does not recall the surgery.    AMS likely 2/2 Acute stroke --mental status appeared improved. --neurology consult today --cont ASA and plavix --MRA of neck    GERD: --cont PPI  COPD: Stable.    Edema of right leg: Venous doppler neg for DVT.  Likely due to recent surgery. --compression stocking   Anemia: From her recent surgery and blood loss. --monitor Hgb    DVT prophylaxis: Lovenox SQ Code Status: Full code  Family Communication: family updated at bedside  Level of care: Telemetry Medical Dispo:   The patient is from: home Anticipated d/c is to: home Anticipated d/c date is: tomorrow Patient currently is not medically ready to d/c due to: pending neuro consult   Subjective and Interval History:  Pt appeared alert and working with OT during rounds.  Pt denied dysuria or bladder pain.    Foley removed today.   Objective: Vitals:   08/02/21 0116 08/02/21 0457 08/02/21 1549 08/02/21 2105  BP: 102/62 (!) 99/45 (!) 108/57 116/61  Pulse: 93 86 88 95  Resp: 20 20 16 16   Temp:  98.3 F (36.8 C) 98.3 F (36.8 C) 99.5 F (37.5 C)  TempSrc:  Oral Oral Oral  SpO2: 96% 96% 100% 90%  Weight:      Height:        Intake/Output Summary (Last 24 hours) at 08/02/2021 2325 Last data filed at 08/02/2021 2054 Gross per 24 hour   Intake 973.96 ml  Output 2600 ml  Net -1626.04 ml   Filed Weights   08/01/21 1425 08/01/21 1558  Weight: 87 kg 85.3 kg    Examination:   Constitutional: NAD, AAOx3, sitting at side of bed HEENT: conjunctivae and lids normal, EOMI CV: No cyanosis.   RESP: normal respiratory effort, on RA Extremities: surgical dressing over right knee, intact and dry SKIN: warm, dry Neuro: II - XII grossly intact.   Foley present   Data Reviewed: I have personally reviewed following labs and imaging studies  CBC: Recent Labs  Lab 08/01/21 1429  WBC 9.4  HGB 9.6*  HCT 30.4*  MCV 97.4  PLT 562   Basic Metabolic Panel: Recent Labs  Lab 08/01/21 1429  NA 128*  K 4.3  CL 95*  CO2 26  GLUCOSE 115*  BUN 23  CREATININE 0.93  CALCIUM 8.0*   GFR: Estimated Creatinine Clearance: 55.8 mL/min (by C-G formula based on SCr of 0.93 mg/dL). Liver Function Tests: No results for input(s): AST, ALT, ALKPHOS, BILITOT, PROT, ALBUMIN in the last 168 hours. No results for input(s): LIPASE, AMYLASE in the last 168 hours. No results for input(s): AMMONIA in the last 168 hours. Coagulation Profile: No results for input(s): INR, PROTIME in the last 168 hours. Cardiac Enzymes: No results for input(s): CKTOTAL, CKMB, CKMBINDEX, TROPONINI in the last 168 hours. BNP (last  3 results) No results for input(s): PROBNP in the last 8760 hours. HbA1C: No results for input(s): HGBA1C in the last 72 hours. CBG: No results for input(s): GLUCAP in the last 168 hours. Lipid Profile: Recent Labs    08/02/21 0646  CHOL 125  HDL 47  LDLCALC 66  TRIG 61  CHOLHDL 2.7   Thyroid Function Tests: Recent Labs    08/02/21 0124  TSH 1.571  FREET4 0.93   Anemia Panel: Recent Labs    08/02/21 0124  VITAMINB12 217   Sepsis Labs: No results for input(s): PROCALCITON, LATICACIDVEN in the last 168 hours.  Recent Results (from the past 240 hour(s))  Resp Panel by RT-PCR (Flu A&B, Covid) Nasopharyngeal Swab      Status: None   Collection Time: 08/01/21  5:01 PM   Specimen: Nasopharyngeal Swab; Nasopharyngeal(NP) swabs in vial transport medium  Result Value Ref Range Status   SARS Coronavirus 2 by RT PCR NEGATIVE NEGATIVE Final    Comment: (NOTE) SARS-CoV-2 target nucleic acids are NOT DETECTED.  The SARS-CoV-2 RNA is generally detectable in upper respiratory specimens during the acute phase of infection. The lowest concentration of SARS-CoV-2 viral copies this assay can detect is 138 copies/mL. A negative result does not preclude SARS-Cov-2 infection and should not be used as the sole basis for treatment or other patient management decisions. A negative result may occur with  improper specimen collection/handling, submission of specimen other than nasopharyngeal swab, presence of viral mutation(s) within the areas targeted by this assay, and inadequate number of viral copies(<138 copies/mL). A negative result must be combined with clinical observations, patient history, and epidemiological information. The expected result is Negative.  Fact Sheet for Patients:  EntrepreneurPulse.com.au  Fact Sheet for Healthcare Providers:  IncredibleEmployment.be  This test is no t yet approved or cleared by the [place] FDA and  has been authorized for detection and/or diagnosis of SARS-CoV-2 by FDA under an Emergency Use Authorization (EUA). This EUA will remain  in effect (meaning this test can be used) for the duration of the COVID-19 declaration under Section 564(b)(1) of the Act, 21 U.S.C.section 360bbb-3(b)(1), unless the authorization is terminated  or revoked sooner.       Influenza A by PCR NEGATIVE NEGATIVE Final   Influenza B by PCR NEGATIVE NEGATIVE Final    Comment: (NOTE) The Xpert Xpress SARS-CoV-2/FLU/RSV plus assay is intended as an aid in the diagnosis of influenza from Nasopharyngeal swab specimens and should not be used as a sole basis  for treatment. Nasal washings and aspirates are unacceptable for Xpert Xpress SARS-CoV-2/FLU/RSV testing.  Fact Sheet for Patients: EntrepreneurPulse.com.au  Fact Sheet for Healthcare Providers: IncredibleEmployment.be  This test is not yet approved or cleared by the [place] FDA and has been authorized for detection and/or diagnosis of SARS-CoV-2 by FDA under an Emergency Use Authorization (EUA). This EUA will remain in effect (meaning this test can be used) for the duration of the COVID-19 declaration under Section 564(b)(1) of the Act, 21 U.S.C. section 360bbb-3(b)(1), unless the authorization is terminated or revoked.  Performed at [facility], [address] [place], [place] [address]       Radiology Studies: CT ANGIO HEAD NECK W WO CM  Result Date: 08/02/2021 CLINICAL DATA:  Stroke suspected, follow-up EXAM: CT ANGIOGRAPHY HEAD AND NECK TECHNIQUE: Multidetector CT imaging of the head and neck was performed using the standard protocol during bolus administration of intravenous contrast. Multiplanar CT image reconstructions and MIPs were obtained to evaluate  the vascular anatomy. Carotid stenosis measurements (when applicable) are obtained utilizing NASCET criteria, using the distal internal carotid diameter as the denominator. RADIATION DOSE REDUCTION: This exam was performed according to the departmental dose-optimization program which includes automated exposure control, adjustment of the mA and/or kV according to patient size and/or use of iterative reconstruction technique. CONTRAST:  40mL OMNIPAQUE IOHEXOL 350 MG/ML SOLN COMPARISON:  No prior CTA, correlation is made with CT head 08/01/2021, MRI head 08/01/2021, and CT neck 05/28/2020 FINDINGS: CT HEAD FINDINGS Brain: Mild focal hypodensity in the anterior left corpus callosum, which correlates with the restricted diffusion seen on the 08/01/2021 MRI. No other new hypodensity to  suggest additional acute infarct. No acute hemorrhage, mass, mass effect, or midline shift. No hydrocephalus or extra-axial collection. Vascular: Please see CTA findings below Skull: Normal. Negative for fracture or focal lesion. Sinuses: Imaged portions are clear. Orbits: No acute finding. Review of the MIP images confirms the above findings CTA NECK FINDINGS Aortic arch: Standard branching. Imaged portion shows no evidence of aneurysm or dissection. No significant stenosis of the major arch vessel origins. Right carotid system: No evidence of dissection, stenosis (50% or greater) or occlusion. Retropharyngeal course of the ICA. Left carotid system: No evidence of dissection, stenosis (50% or greater) or occlusion. Vertebral arteries: No evidence of dissection, stenosis (50% or greater) or occlusion. Skeleton: No acute osseous abnormality. Other neck: 2.0 cm hypoenhancing nodule in the posterior right thyroid lobe, which correlates with the lesion evaluated on the 06/11/2020 ultrasound and appears similar to the 05/28/2020 CT neck. Upper chest: Small right pleural effusion with associated atelectasis. Centrilobular emphysema. No left pleural effusion. Review of the MIP images confirms the above findings CTA HEAD FINDINGS Anterior circulation: Both internal carotid arteries are patent to the termini, with mild calcifications but without significant stenosis. A1 segments patent. Normal anterior communicating artery. Anterior cerebral arteries are patent to their distal aspects. No M1 stenosis or occlusion. Normal MCA bifurcations. Distal MCA branches perfused and symmetric. Posterior circulation: Vertebral arteries patent to the vertebrobasilar junction without stenosis. Basilar patent to its distal aspect. Superior cerebellar arteries patent bilaterally. Bilateral P1 segments originate from the basilar artery. Bilateral posterior communicating arteries are patent. PCAs perfused to their distal aspects without  stenosis. Venous sinuses: As permitted by contrast timing, patent. Anatomic variants: None significant Review of the MIP images confirms the above findings IMPRESSION: 1. Hypodensity in the anterior left corpus callosum correlates with the restricted diffusion seen on the 08/01/2021 MRI. No other acute intracranial process. 2.  No intracranial large vessel occlusion or significant stenosis. 3.  No hemodynamically significant stenosis in the neck. 4. Small right pleural effusion. 5. 1.9 cm nodule in the posterior right thyroid lobe, which correlates with a lesion last imaged on a 06/11/2020 ultrasound of the thyroid and appears unchanged compared to the 05/28/2020 CT neck. Further follow-up with ultrasound could be obtained if clinically indicated. 6.  Emphysema (ICD10-J43.9). Electronically Signed   By: Merilyn Baba M.D.   On: 08/02/2021 16:56   DG Chest 1 View  Result Date: 08/01/2021 CLINICAL DATA:  confusion, low oxygen EXAM: CHEST  1 VIEW COMPARISON:  Chest x-ray 01/18/2014. CT chest 12/03/2020 FINDINGS: The heart and mediastinal contours are within normal limits. Biapical pleural/pulmonary scarring. No focal consolidation. Slightly more prominent chronic coarsened interstitial markings suggestive of overlying acute bronchitic changes. No pleural effusion. No pneumothorax. No acute osseous abnormality. IMPRESSION: 1. Likely acute on chronic bronchitic changes. 2.  Emphysema (ICD10-J43.9). Electronically Signed  By: Iven Finn M.D.   On: 08/01/2021 17:03   CT Head Wo Contrast  Result Date: 08/01/2021 CLINICAL DATA:  Altered mental status EXAM: CT HEAD WITHOUT CONTRAST TECHNIQUE: Contiguous axial images were obtained from the base of the skull through the vertex without intravenous contrast. RADIATION DOSE REDUCTION: This exam was performed according to the departmental dose-optimization program which includes automated exposure control, adjustment of the mA and/or kV according to patient size  and/or use of iterative reconstruction technique. COMPARISON:  Overlapping portion of CT neck 05/28/2020 FINDINGS: Brain: The brainstem, cerebellum, cerebral peduncles, thalami, basal ganglia, basilar cisterns, and ventricular system appear within normal limits. No intracranial hemorrhage, mass lesion, or acute CVA. Vascular: There is atherosclerotic calcification of the cavernous carotid arteries bilaterally. Skull: Unremarkable Sinuses/Orbits: Unremarkable where included. Other: Unremarkable IMPRESSION: 1. No acute intracranial findings. 2. Atherosclerosis. Electronically Signed   By: Van Clines M.D.   On: 08/01/2021 17:30   MR BRAIN WO CONTRAST  Result Date: 08/01/2021 CLINICAL DATA:  Initial evaluation for acute mental status change. EXAM: MRI HEAD WITHOUT CONTRAST TECHNIQUE: Multiplanar, multiecho pulse sequences of the brain and surrounding structures were obtained without intravenous contrast. COMPARISON:  Comparison made with prior head CT from earlier the same day. FINDINGS: Brain: Examination moderately degraded by motion artifact Cerebral volume within normal limits for age. No significant cerebral white matter disease seen on this motion degraded exam. Abnormal restricted diffusion seen involving the central and left aspect of the anterior genu of the corpus callosum (series 5, image 26). Inferior extension to involve the fornices bilaterally (series 5, image 24). Finding likely reflects changes of an acute infarct, ACA distribution. No associated hemorrhage or significant mass effect. No other diffusion abnormality to suggest acute or subacute ischemia. Gray-white matter differentiation otherwise maintained. No areas of encephalomalacia to suggest chronic cortical infarction elsewhere within the brain. No other acute or chronic intracranial blood products. No mass lesion, midline shift or mass effect. No hydrocephalus or extra-axial fluid collection. Partially empty sella noted. Midline  structures intact. Vascular: Major intracranial vascular flow voids are maintained. Skull and upper cervical spine: Craniocervical junction within normal limits. Bone marrow signal intensity normal. No scalp soft tissue abnormality. Sinuses/Orbits: Increased CSF fluid signal intensity seen along the optic nerve sheaths bilaterally with mild flattening at the posterior globes (series 10, image 8). Globes and orbital soft tissues otherwise unremarkable. Mild mucosal thickening noted about the ethmoidal air cells. Paranasal sinuses are otherwise clear. No mastoid effusion. Inner ear structures grossly normal. Other: None. IMPRESSION: 1. Abnormal restricted diffusion involving the central and left aspect of the anterior genu of the corpus callosum, with extension to involve the fornices bilaterally. Finding likely reflects changes of an acute ACA distribution infarct. No associated hemorrhage or significant mass effect. 2. Partially empty sella with increased CSF signal intensity along the optic nerve sheaths bilaterally. While these findings are nonspecific and may be of no significance, changes such as these can also be seen in the setting of idiopathic intracranial hypertension. 3. Otherwise unremarkable brain MRI for age. Electronically Signed   By: Jeannine Boga M.D.   On: 08/01/2021 23:06   CT ABDOMEN PELVIS W CONTRAST  Result Date: 08/01/2021 CLINICAL DATA:  History of recent right total knee arthroplasty. Postop abdominal pain and altered mental status. EXAM: CT ABDOMEN AND PELVIS WITH CONTRAST TECHNIQUE: Multidetector CT imaging of the abdomen and pelvis was performed using the standard protocol following bolus administration of intravenous contrast. RADIATION DOSE REDUCTION: This exam was performed  according to the departmental dose-optimization program which includes automated exposure control, adjustment of the mA and/or kV according to patient size and/or use of iterative reconstruction  technique. CONTRAST:  110mL OMNIPAQUE IOHEXOL 300 MG/ML  SOLN COMPARISON:  CT scan 01/28/2019 FINDINGS: Lower chest: Streaky subsegmental basilar atelectasis. No infiltrates or effusions. The heart is normal in size. No pericardial effusion. Hepatobiliary: No hepatic lesions or intrahepatic biliary dilatation. Gallbladder is unremarkable. No common bile duct dilatation. Pancreas: No mass, inflammation or ductal dilatation. Spleen: Normal size.  No focal lesions. Adrenals/Urinary Tract: Adrenal glands and kidneys are unremarkable. No renal lesions or hydronephrosis. Stable renal cysts. The bladder is decompressed by a Foley catheter. Stomach/Bowel: The stomach, duodenum, small bowel and colon are grossly normal. No acute inflammatory changes, mass lesions or obstructive findings. Vascular/Lymphatic: Stable atherosclerotic calcifications involving the aorta and branch vessels. No aneurysm or dissection. The major venous structures are patent. No mesenteric or retroperitoneal mass or adenopathy. Reproductive: Surgically absent. Other: No pelvic mass or adenopathy. No free pelvic fluid collections. No inguinal mass or adenopathy. No abdominal wall hernia or subcutaneous lesions. There is fluid and gas in the anterior upper thigh musculature likely related to the recent knee surgery. Musculoskeletal: No significant bony findings. IMPRESSION: 1. No acute abdominal/pelvic findings, mass lesions or adenopathy. 2. Fluid and gas in the anterior upper thigh musculature likely related to the recent knee surgery. Aortic Atherosclerosis (ICD10-I70.0). Electronically Signed   By: Marijo Sanes M.D.   On: 08/01/2021 17:35   US Carotid Bilateral  Result Date: 08/02/2021 CLINICAL DATA:  Acute CVA EXAM: BILATERAL CAROTID DUPLEX ULTRASOUND TECHNIQUE: Pearline Cables scale imaging, color Doppler and duplex ultrasound were performed of bilateral carotid and vertebral arteries in the neck. COMPARISON:  None. FINDINGS: Criteria: Quantification of  carotid stenosis is based on velocity parameters that correlate the residual internal carotid diameter with NASCET-based stenosis levels, using the diameter of the distal internal carotid lumen as the denominator for stenosis measurement. The following velocity measurements were obtained: RIGHT ICA: 134/35 cm/sec CCA: 812/75 cm/sec SYSTOLIC ICA/CCA RATIO:  1.3 ECA: 114 cm/sec LEFT ICA: 120/39 cm/sec CCA: 17/00 cm/sec SYSTOLIC ICA/CCA RATIO:  1.3 ECA: 99 cm/sec RIGHT CAROTID ARTERY: Mildly elevated peak systolic velocity is likely artifact given only minimal atheromatous plaque of the carotid bifurcation. RIGHT VERTEBRAL ARTERY:  Antegrade flow. LEFT CAROTID ARTERY: Minimal atheromatous plaque of the carotid bifurcation. LEFT VERTEBRAL ARTERY:  Antegrade flow. IMPRESSION: Less than 50% stenosis of the internal carotid arteries. Electronically Signed   By: Miachel Roux M.D.   On: 08/02/2021 11:00   US Venous Img Lower Bilateral (DVT)  Result Date: 08/02/2021 CLINICAL DATA:  Lower extremity swelling and pain Recent knee surgery EXAM: BILATERAL LOWER EXTREMITY VENOUS DOPPLER ULTRASOUND TECHNIQUE: Gray-scale sonography with compression, as well as color and duplex ultrasound, were performed to evaluate the deep venous system(s) from the level of the common femoral vein through the popliteal and proximal calf veins. COMPARISON:  None. FINDINGS: VENOUS Normal compressibility of the common femoral, superficial femoral, and popliteal veins, as well as the visualized calf veins. Visualized portions of profunda femoral vein and great saphenous vein unremarkable. No filling defects to suggest DVT on grayscale or color Doppler imaging. Doppler waveforms show normal direction of venous flow, normal respiratory plasticity and response to augmentation. OTHER None. Limitations: none IMPRESSION: No lower extremity DVT. Electronically Signed   By: Miachel Roux M.D.   On: 08/02/2021 10:44   ECHOCARDIOGRAM COMPLETE  Result Date:  08/02/2021    ECHOCARDIOGRAM  REPORT   Patient Name:   JOHNNAE IMPASTATO Date of Exam: 08/02/2021 Medical Rec #:  [id number]        Height:       63.0 in Accession #:    [id number]       Weight:       188.0 lb Date of Birth:  07-May-1946        BSA:          1.883 m Patient Age:    64 years         BP:           99/45 mmHg Patient Gender: F                HR:           103 bpm. Exam Location:  [facility] Procedure: 2D Echo, Color Doppler and Cardiac Doppler Indications:     I63.9 Stroke  History:         Patient has no prior history of Echocardiogram examinations.                  COPD; Risk Factors:Current Smoker.  Sonographer:     Sherrie Sport Referring Phys:  [place] Diagnosing Phys: Serafina Royals MD  Sonographer Comments: Suboptimal apical window and suboptimal subcostal window. Image acquisition challenging due to COPD. IMPRESSIONS  1. Left ventricular ejection fraction, by estimation, is 60 to 65%. The left ventricle has normal function. The left ventricle has no regional wall motion abnormalities. Left ventricular diastolic parameters were normal.  2. Right ventricular systolic function is normal. The right ventricular size is normal.  3. The mitral valve is normal in structure. Trivial mitral valve regurgitation.  4. The aortic valve is normal in structure. Aortic valve regurgitation is trivial. FINDINGS  Left Ventricle: Left ventricular ejection fraction, by estimation, is 60 to 65%. The left ventricle has normal function. The left ventricle has no regional wall motion abnormalities. The left ventricular internal cavity size was normal in size. There is  no left ventricular hypertrophy. Left ventricular diastolic parameters were normal. Right Ventricle: The right ventricular size is normal. No increase in right ventricular wall thickness. Right ventricular systolic function is normal. Left Atrium: Left atrial size was normal in size. Right Atrium: Right atrial size was normal in size. Pericardium: There is  no evidence of pericardial effusion. Mitral Valve: The mitral valve is normal in structure. Trivial mitral valve regurgitation. MV peak gradient, 4.2 mmHg. The mean mitral valve gradient is 2.0 mmHg. Tricuspid Valve: The tricuspid valve is normal in structure. Tricuspid valve regurgitation is trivial. Aortic Valve: The aortic valve is normal in structure. Aortic valve regurgitation is trivial. Aortic valve mean gradient measures 4.0 mmHg. Aortic valve peak gradient measures 8.4 mmHg. Aortic valve area, by VTI measures 3.08 cm. Pulmonic Valve: The pulmonic valve was normal in structure. Pulmonic valve regurgitation is not visualized. Aorta: The aortic root and ascending aorta are structurally normal, with no evidence of dilitation. IAS/Shunts: No atrial level shunt detected by color flow Doppler.  LEFT VENTRICLE PLAX 2D LVIDd:         3.30 cm   Diastology LVIDs:         2.35 cm   LV e' medial:    7.29 cm/s LV PW:         0.90 cm   LV E/e' medial:  13.5 LV IVS:        0.95 cm   LV e' lateral:  10.30 cm/s LVOT diam:     2.00 cm   LV E/e' lateral: 9.6 LV SV:         74 LV SV Index:   39 LVOT Area:     3.14 cm  RIGHT VENTRICLE RV Basal diam:  3.10 cm RV S prime:     15.90 cm/s TAPSE (M-mode): 2.2 cm LEFT ATRIUM             Index        RIGHT ATRIUM           Index LA diam:        3.10 cm 1.65 cm/m   RA Area:     13.20 cm LA Vol (A2C):   47.2 ml 25.06 ml/m  RA Volume:   35.00 ml  18.58 ml/m LA Vol (A4C):   32.3 ml 17.15 ml/m LA Biplane Vol: 43.0 ml 22.83 ml/m  AORTIC VALVE                    PULMONIC VALVE AV Area (Vmax):    2.84 cm     PV Vmax:        0.82 m/s AV Area (Vmean):   2.79 cm     PV Vmean:       57.300 cm/s AV Area (VTI):     3.08 cm     PV VTI:         0.160 m AV Vmax:           145.00 cm/s  PV Peak grad:   2.7 mmHg AV Vmean:          96.500 cm/s  PV Mean grad:   2.0 mmHg AV VTI:            0.241 m      RVOT Peak grad: 4 mmHg AV Peak Grad:      8.4 mmHg AV Mean Grad:      4.0 mmHg LVOT Vmax:          131.00 cm/s LVOT Vmean:        85.700 cm/s LVOT VTI:          0.236 m LVOT/AV VTI ratio: 0.98  AORTA Ao Root diam: 3.10 cm MITRAL VALVE               TRICUSPID VALVE MV Area (PHT): 4.12 cm    TR Peak grad:   18.3 mmHg MV Area VTI:   2.87 cm    TR Vmax:        214.00 cm/s MV Peak grad:  4.2 mmHg MV Mean grad:  2.0 mmHg    SHUNTS MV Vmax:       1.03 m/s    Systemic VTI:  0.24 m MV Vmean:      74.1 cm/s   Systemic Diam: 2.00 cm MV Decel Time: 184 msec    Pulmonic VTI:  0.156 m MV E velocity: 98.50 cm/s MV A velocity: 67.30 cm/s MV E/A ratio:  1.46 Serafina Royals MD Electronically signed by Serafina Royals MD Signature Date/Time: 08/02/2021/4:06:47 PM    Final      Scheduled Meds:  acidophilus  1 capsule Oral Daily   [START ON 08/03/2021] aspirin EC  81 mg Oral Daily   Chlorhexidine Gluconate Cloth  6 each Topical Daily   clopidogrel  75 mg Oral Daily   enoxaparin (LOVENOX) injection  40 mg Subcutaneous Q24H   gabapentin  300 mg Oral TID   mometasone-formoterol  2  puff Inhalation BID   pantoprazole  40 mg Oral QHS   pravastatin  40 mg Oral Daily   Continuous Infusions:  sodium chloride 100 mL/hr at 08/02/21 1058     LOS: 1 day     Enzo Bi, MD [facility] If 7PM-7AM, please contact night-coverage 08/02/2021, 11:25 PM

## 2021-08-03 LAB — BASIC METABOLIC PANEL
Anion gap: 11 (ref 5–15)
BUN: 8 mg/dL (ref 8–23)
CO2: 27 mmol/L (ref 22–32)
Calcium: 7.7 mg/dL — ABNORMAL LOW (ref 8.9–10.3)
Chloride: 98 mmol/L (ref 98–111)
Creatinine, Ser: 0.53 mg/dL (ref 0.44–1.00)
GFR, Estimated: >60 mL/min (ref >60)
Glucose, Bld: 106 mg/dL — ABNORMAL HIGH (ref 70–99)
Potassium: 3.6 mmol/L (ref 3.5–5.1)
Sodium: 136 mmol/L (ref 135–145)

## 2021-08-03 LAB — CBC
HCT: 27.1 % — ABNORMAL LOW (ref 36.0–46.0)
Hemoglobin: 8.7 g/dL — ABNORMAL LOW (ref 12.0–15.0)
MCH: 30.3 pg (ref 26.0–34.0)
MCHC: 32.1 g/dL (ref 30.0–36.0)
MCV: 94.4 fL (ref 80.0–100.0)
Platelets: 259 10*3/uL (ref 150–400)
RBC: 2.87 MIL/uL — ABNORMAL LOW (ref 3.87–5.11)
RDW: 13.2 % (ref 11.5–15.5)
WBC: 5.5 10*3/uL (ref 4.0–10.5)
nRBC: 0 % (ref 0.0–0.2)

## 2021-08-03 LAB — URINE CULTURE: Culture: NO GROWTH

## 2021-08-03 LAB — MAGNESIUM: Magnesium: 2.6 mg/dL — ABNORMAL HIGH (ref 1.7–2.4)

## 2021-08-03 MED ORDER — ASPIRIN 81 MG PO TBEC: 81.0000 mg | DELAYED_RELEASE_TABLET | Freq: Every day | ORAL | Status: AC

## 2021-08-03 MED ORDER — CLOPIDOGREL BISULFATE 75 MG PO TABS: 75.0000 mg | ORAL_TABLET | Freq: Every day | ORAL | 0 refills | Status: AC

## 2021-08-03 NOTE — TOC Transition Note (Addendum)
Transition of Care [facility]) - CM/SW Discharge Note   Patient Details  Name: Alexis Reed MRN: [id number] Date of Birth: 07/09/1946  Transition of Care [facility]) CM/SW Contact:  Harriet Masson, RN Phone Number: 08/03/2021, 12:58 PM   Clinical Narrative:    Pt to be discharged today. Spoke with daughter Glennis Brink while speech at bedside. Plan for pt to be discharged home with [facility] for the requested PT/OT/RN/Speech. Spoke with Malachy Mood with [facility] to confirmed the requested therapies.  No additional needs at this time.  Team remains available for any additional needs.  3:50 pm Addendum- Requested a 3-1 commode. Spoke with family at bedside with the request for [employer]. Made a referral to [facility]) who will call the pt's room concerning co-pays/deductible and deliver to bedside if DME accepted.    Final next level of care: [place] Barriers to Discharge: Continued Medical Work up   Patient Goals and CMS Choice        Discharge Placement                       Discharge Plan and Services     Post Acute Care Choice: Home Health            DME Agency: [facility] Date DME Agency Contacted: 08/02/21 Time DME Agency Contacted: [phone] Representative spoke with at DME Agency: rhonda, to be delivered on discharge HH Arranged: PT, OT [place] Agency: [place] Date [place]: 08/02/21 Time [place]: 1612 Representative spoke with at [place]: Rhea ([place]) Interventions     Readmission Risk Interventions No flowsheet data found.

## 2021-08-03 NOTE — Consult Note (Signed)
NEUROLOGY CONSULTATION NOTE   Date of service: August 03, 2021 Patient Name: Alexis Reed MRN:  [id number] DOB:  Oct 05, 1945 Reason for consult: stroke Requesting physician: Dr. Enzo Bi _ _ _   _ __   _ __ _ _  __ __   _ __   __ _  History of Present Illness   This is a 76 yo woman with hx tobacco abuse, COPD, HL who presented to ED 08/01/21 with confusion x48 hrs. Patient became disoriented approx 24 hrs after undergoing L TKA on Tues. No focal deficits other than RLE exam is pain-limited. She is oriented x2, thinks year is 2013. Delayed recall. Family at bedside felt that her mental status improved this morning but late afternoon started becoming a bit more confused. She was on ASA 81mg  daily PTA. Stroke workup this admission:  MRI brain wo - acute infarct in central and L aspect of anterior genu of corpus callosum with extension to bilat fornices c/w ACA infarct (personal review)  CTA H&N - no hemodynamically significant stenosis  TTE - WNL, no intracardiac clot  Stroke Labs     Component Value Date/Time   CHOL 125 08/02/2021 0646   TRIG 61 08/02/2021 0646   HDL 47 08/02/2021 0646   CHOLHDL 2.7 08/02/2021 0646   VLDL 12 08/02/2021 0646   LDLCALC 66 08/02/2021 0646   LDLDIRECT 153.0 07/17/2015 1336    Lab Results  Component Value Date/Time   HGBA1C 5.5 08/02/2021 06:46 AM      ROS   Per HPI: all other systems reviewed and are negative  Past History   I have reviewed the following:  Past Medical History:  Diagnosis Date   Arthritis    Benign breast lumps    multiple lumps biopsy and remove x's 5 last being 1979 by Dr. Sharlet Salina   COPD (chronic obstructive pulmonary disease) ([place])    MILD-RARELY EVER HAS TO USE COMBIVENT INHALER   Fibroids    s/p hysterectomy   GERD (gastroesophageal reflux disease)    Hypercholesterolemia    Osteoporosis    Personal history of tobacco use, presenting hazards to health 08/07/2015   Urinary tract infection    Past Surgical  History:  Procedure Laterality Date   ABDOMINAL HYSTERECTOMY  1997   with bilateral oophorectomy   BREAST BIOPSY Left    multiple done   BREAST BIOPSY Right 2016   stereo- neg. FC changes   BREAST EXCISIONAL BIOPSY Right 1975   multiple biopsies done   BREAST SURGERY Left    lump removed. Unsure of date   BREAST SURGERY Right    lump removed. Unsure of date   COLONOSCOPY  2010   Dr. Vira Agar   COLONOSCOPY WITH PROPOFOL N/A 06/17/2021   Procedure: COLONOSCOPY WITH PROPOFOL;  Surgeon: Lin Landsman, MD;  Location: [facility] ENDOSCOPY;  Service: Gastroenterology;  Laterality: N/A;   ESOPHAGOGASTRODUODENOSCOPY (EGD) WITH PROPOFOL  06/17/2021   Procedure: ESOPHAGOGASTRODUODENOSCOPY (EGD) WITH PROPOFOL;  Surgeon: Lin Landsman, MD;  Location: [facility] ENDOSCOPY;  Service: Gastroenterology;;   KNEE ARTHROSCOPY WITH LATERAL MENISECTOMY Right 05/08/2020   Procedure: RIGHT KNEE ARTHROSCOPY WITH DEBRIDEMENT AND PARTIAL LATERAL MENISCECTOMY;  Surgeon: Corky Mull, MD;  Location: [facility] ORS;  Service: Orthopedics;  Laterality: Right;   LAPAROSCOPIC APPENDECTOMY N/A 12/21/2018   Procedure: APPENDECTOMY LAPAROSCOPIC CONVERTED TO OPEN;  Surgeon: Benjamine Sprague, DO;  Location: [facility] ORS;  Service: General;  Laterality: N/A;   TONSILLECTOMY     Family History  Problem Relation Age  of Onset   Hyperlipidemia Mother    Thyroid disease Mother    Hypertension Mother    Urinary tract infection Mother    Colon cancer Father    Stroke Father    Heart disease Father        myocardial infarction   Breast cancer Neg Hx    Social History   Socioeconomic History   Marital status: Married    Spouse name: Not on file   Number of children: 2   Years of education: Not on file   Highest education level: Not on file  Occupational History   Not on file  Tobacco Use   Smoking status: Former    Packs/day: 1.00    Years: 30.00    Pack years: 30.00    Types: Cigarettes    Quit date: 04/26/2004    Years since  quitting: 17.2   Smokeless tobacco: Never  Vaping Use   Vaping Use: Never used  Substance and Sexual Activity   Alcohol use: Yes    Alcohol/week: 0.0 standard drinks    Comment: occassional wine every other day   Drug use: No   Sexual activity: Yes    Birth control/protection: Post-menopausal  Other Topics Concern   Not on file  Social History Narrative   She is married, has two children. Works at [employer] Strain: Not on file  Food Insecurity: Not on file  Transportation Needs: Not on file  Physical Activity: Not on file  Stress: Not on file  Social Connections: Not on file   Allergies  Allergen Reactions   Benzoin Compound Other (See Comments)    After breast surgery After breast surgery   Boniva [Ibandronic Acid] Other (See Comments)    Aching    Codeine Nausea Only   Fosamax [Alendronate Sodium]     Caused aching   Lipitor [Atorvastatin] Hives   Meloxicam Other (See Comments)    GI Upset   Penicillins Swelling    Facial swelling Did it involve swelling of the face/tongue/throat, SOB, or low BP? Yes Did it involve sudden or severe rash/hives, skin peeling, or any reaction on the inside of your mouth or nose? No Did you need to seek medical attention at a hospital or doctor's office? No When did it last happen?      within the last 10 years If all above answers are NO, may proceed with cephalosporin use.     Medications   Medications Prior to Admission  Medication Sig Dispense Refill Last Dose   acetaminophen (TYLENOL) 500 MG tablet Take 1,000 mg by mouth every 6 (six) hours as needed for moderate pain.   unk   aspirin EC 325 MG tablet Take 325 mg by mouth daily. Taking post surgery for 30 days. From 07-30-21   08/01/2021   Calcium Carb-Cholecalciferol (CALCIUM 500 +D) 500-400 MG-UNIT TABS Take 1 tablet by mouth daily.   07/31/2021   Cholecalciferol (VITAMIN D) 2000 UNITS tablet  Take 2,000 Units by mouth daily.   07/31/2021   Cranberry 450 MG CAPS Take 450 mg by mouth daily.   07/31/2021   denosumab (PROLIA) 60 MG/ML SOSY injection Inject 60 mg into the skin every 6 (six) months.    Past Month   fluticasone-salmeterol (ADVAIR HFA) 230-21 MCG/ACT inhaler Inhale 2 puffs into the lungs 2 (two) times daily. 1 each 12 08/01/2021   gabapentin (NEURONTIN) 300 MG capsule Take 300 mg  by mouth 3 (three) times daily.   08/01/2021   Ipratropium-Albuterol (COMBIVENT RESPIMAT) 20-100 MCG/ACT AERS respimat Inhale 1 puff into the lungs every 6 (six) hours as needed for wheezing. 1 each 1 unk   methocarbamol (ROBAXIN) 500 MG tablet Take 500 mg by mouth every 8 (eight) hours as needed for muscle spasms.   08/01/2021   omeprazole (PRILOSEC) 20 MG capsule Take 1 capsule (20 mg total) by mouth daily. 90 capsule 1 07/31/2021   ondansetron (ZOFRAN) 4 MG tablet Take 8 mg by mouth every 8 (eight) hours as needed for nausea or vomiting.   unk   oxyCODONE (OXY IR/ROXICODONE) 5 MG immediate release tablet Take 5 mg by mouth every 6 (six) hours as needed for moderate pain.   08/01/2021   pravastatin (PRAVACHOL) 40 MG tablet Take 1 tablet (40 mg total) by mouth daily. 90 tablet 1 07/31/2021   Probiotic Product (PROBIOTIC DAILY PO) Take 1 capsule by mouth daily.   07/31/2021   traMADol (ULTRAM) 50 MG tablet Take 50-100 mg by mouth every 6 (six) hours as needed for moderate pain.   08/01/2021      Current Facility-Administered Medications:    acidophilus (RISAQUAD) capsule 1 capsule, 1 capsule, Oral, Daily, Florina Ou V, MD, 1 capsule at 08/02/21 1126   aspirin EC tablet 81 mg, 81 mg, Oral, Daily, Para Skeans, MD   Chlorhexidine Gluconate Cloth 2 % PADS 6 each, 6 each, Topical, Daily, Enzo Bi, MD, 6 each at 08/02/21 1334   clopidogrel (PLAVIX) tablet 75 mg, 75 mg, Oral, Daily, Derek Jack, MD, 75 mg at 08/02/21 1538   enoxaparin (LOVENOX) injection 40 mg, 40 mg, Subcutaneous, Q24H, Enzo Bi, MD, 40  mg at 08/02/21 2022   gabapentin (NEURONTIN) capsule 300 mg, 300 mg, Oral, TID, Para Skeans, MD, 300 mg at 08/02/21 2023   ipratropium-albuterol (DUONEB) 0.5-2.5 (3) MG/3ML nebulizer solution 3 mL, 3 mL, Inhalation, Q6H PRN, Para Skeans, MD   mometasone-formoterol (DULERA) 200-5 MCG/ACT inhaler 2 puff, 2 puff, Inhalation, BID, Para Skeans, MD, 2 puff at 08/02/21 2023   ondansetron (ZOFRAN) tablet 4 mg, 4 mg, Oral, Q8H PRN, Para Skeans, MD   oxyCODONE (Oxy IR/ROXICODONE) immediate release tablet 5 mg, 5 mg, Oral, Q6H PRN, Para Skeans, MD, 5 mg at 08/02/21 1327   pantoprazole (PROTONIX) EC tablet 40 mg, 40 mg, Oral, QHS, Dorothe Pea, RPH, 40 mg at 08/02/21 2023   pravastatin (PRAVACHOL) tablet 40 mg, 40 mg, Oral, Daily, Florina Ou V, MD, 40 mg at 08/02/21 1126   traMADol (ULTRAM) tablet 50 mg, 50 mg, Oral, Q6H PRN, Florina Ou V, MD, 50 mg at 08/02/21 1823  Vitals   Vitals:   08/02/21 2105 08/03/21 0010 08/03/21 0450 08/03/21 0819  BP: 116/61 (!) 117/57 131/63 120/65  Pulse: 95 100 92 87  Resp: 16 16 16 16   Temp: 99.5 F (37.5 C) 99.8 F (37.7 C) 99.3 F (37.4 C) 99.4 F (37.4 C)  TempSrc: Oral Oral Oral Oral  SpO2: 90% 91% 90% 91%  Weight:      Height:         Body mass index is 33.3 kg/m.  Physical Exam   Physical Exam Gen: A&O x2, NAD HEENT: Atraumatic, normocephalic;mucous membranes moist; oropharynx clear, tongue without atrophy or fasciculations. Neck: Supple, trachea midline. Resp: CTAB, no w/r/r CV: RRR, no m/g/r; nml S1 and S2. 2+ symmetric peripheral pulses. Abd: soft/NT/ND; nabs x 4 quad Extrem: Nml bulk;  no cyanosis, clubbing, or edema.  Neuro: *MS: A&O x2, states year is 2013. Follows multi-step commands. 3/3 registration, 1/3 delayed recall. Calculation intact, attention slightly impaired. *Speech: fluid, nondysarthric, able to name and repeat *CN:    I: Deferred   II,III: PERRLA, VFF by confrontation, optic discs unable to be visualized 2/2  pupillary constriction   III,IV,VI: EOMI w/o nystagmus, no ptosis   V: Sensation intact from V1 to V3 to LT   VII: Eyelid closure was full.  Smile symmetric.   VIII: Hearing intact to voice   IX,X: Voice normal, palate elevates symmetrically    XI: SCM/trap 5/5 bilat   XII: Tongue protrudes midline, no atrophy or fasciculations  *Motor:   Normal bulk.  No tremor, rigidity or bradykinesia. No pronator drift. All extremities full strength and symmetric except RLE not examined 2/2 postop pain *Sensory: Intact to light touch, pinprick, temperature vibration throughout. Symmetric. Propioception intact bilat.  No double-simultaneous extinction.  *Coordination:  Finger-to-nose, heel-to-shin, rapid alternating motions were intact. *Reflexes:  2+ and symmetric throughout without clonus; toes down-going bilat *Gait: deferred  NIHSS = 4 (1 LOC questions, 3 LLE motor (postop))  Premorbid mRS = 3 (2/2 R TKA last week)   Labs   CBC:  Recent Labs  Lab 08/01/21 1429 08/03/21 0639  WBC 9.4 5.5  HGB 9.6* 8.7*  HCT 30.4* 27.1*  MCV 97.4 94.4  PLT 250 220    Basic Metabolic Panel:  Lab Results  Component Value Date   NA 136 08/03/2021   K 3.6 08/03/2021   CO2 27 08/03/2021   GLUCOSE 106 (H) 08/03/2021   BUN 8 08/03/2021   CREATININE 0.53 08/03/2021   CALCIUM 7.7 (L) 08/03/2021   GFRNONAA >60 08/03/2021   GFRAA >60 12/22/2018   Lipid Panel:  Lab Results  Component Value Date   LDLCALC 66 08/02/2021   HgbA1c:  Lab Results  Component Value Date   HGBA1C 5.5 08/02/2021   Urine Drug Screen: No results found for: LABOPIA, COCAINSCRNUR, LABBENZ, AMPHETMU, THCU, LABBARB  Alcohol Level No results found for: ETH   Impression   This is a 76 yo woman with hx tobacco abuse, COPD, HL who presented to ED 08/01/21 with confusion x48 hrs and 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 Infarct in this area can cause AMS without focal deficits. Her sx overall are mild, although she had a bit of  sundowning this evening.   Recommendations   - Goal normotension since >48 hrs from sx onset, avoid hypotension - ASA 81mg  daily + plavix 75mg  daily x21 days f/b plavix 75mg  daily monotherapy after that - Continue home pravastatin 40mg  daily (allergic to atorva) - q4 hr neuro checks - STAT head CT for any change in neuro exam - Tele - PT/OT/SLP - Stroke education - Amb referral to neurology upon discharge   I will follow up with patient and family in the AM and review her CTA results, after which point her stroke workup will be complete.  ______________________________________________________________________   Thank you for the opportunity to take part in the care of this patient. If you have any further questions, please contact the neurology consultation attending.  Signed,  Su Monks, MD [facility] [phone]  If 7pm- 7am, please page neurology on call as listed in [place].

## 2021-08-03 NOTE — Plan of Care (Signed)

## 2021-08-03 NOTE — Evaluation (Signed)
Speech Language Pathology Evaluation Patient Details Name: Alexis Reed MRN: [id number] DOB: 08/10/1945 Today's Date: 08/03/2021 Time: 1200-1230 SLP Time Calculation (min) (ACUTE ONLY): 30 min  Problem List:  Patient Active Problem List   Diagnosis Date Noted   Edema of right lower leg 08/02/2021   Acute CVA (cerebrovascular accident) ([place]) 08/02/2021   AMS (altered mental status) 08/01/2021   Pre-op evaluation 07/24/2021   Lung nodules 07/24/2021   Dyspepsia    Burping    Polyp of cecum    Polyp of ascending colon    Vitamin D deficiency 05/11/2021   Increased heart rate 05/10/2021   Breast cancer screening 01/07/2021   Ear anomaly 07/18/2020   Complex tear of medial meniscus of right knee as current injury 06/22/2020   Breast nodule 06/10/2020   Thyroid nodule 05/29/2020   Asymmetry of clavicles 05/07/2020   Complex tear of lateral meniscus of right knee as current injury 04/30/2020   Primary osteoarthritis of right knee 04/30/2020   Leg wound, right 01/16/2020   Laceration of skin of knee 01/10/2020   Palpitations 10/10/2019   Breast pain 10/10/2019   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   Abdominal pain 08/22/2018   History of colonic polyps 02/04/2016   Personal history of tobacco use, presenting hazards to health 08/07/2015   Health care maintenance 11/18/2014   Breast calcifications on mammogram 10/18/2014   Obesity 07/23/2014   COPD (chronic obstructive pulmonary disease) ([place]) 04/26/2014   Tobacco use disorder 04/26/2014   Abnormal mammogram 04/12/2014   UTI (urinary tract infection) 04/02/2014   SOB (shortness of breath) 02/25/2014   Stress 02/25/2014   Internal hemorrhoids without complication 40/02/6760   Osteoporosis 06/18/2012   GERD (gastroesophageal reflux disease) 06/18/2012   Hypercholesterolemia 06/18/2012   Past Medical History:  Past Medical History:  Diagnosis Date   Arthritis    Benign breast lumps    multiple lumps biopsy and remove x's 5  last being 1979 by Dr. Sharlet Salina   COPD (chronic obstructive pulmonary disease) ([place])    MILD-RARELY EVER HAS TO USE COMBIVENT INHALER   Fibroids    s/p hysterectomy   GERD (gastroesophageal reflux disease)    Hypercholesterolemia    Osteoporosis    Personal history of tobacco use, presenting hazards to health 08/07/2015   Urinary tract infection    Past Surgical History:  Past Surgical History:  Procedure Laterality Date   ABDOMINAL HYSTERECTOMY  1997   with bilateral oophorectomy   BREAST BIOPSY Left    multiple done   BREAST BIOPSY Right 2016   stereo- neg. FC changes   BREAST EXCISIONAL BIOPSY Right 1975   multiple biopsies done   BREAST SURGERY Left    lump removed. Unsure of date   BREAST SURGERY Right    lump removed. Unsure of date   COLONOSCOPY  2010   Dr. Vira Agar   COLONOSCOPY WITH PROPOFOL N/A 06/17/2021   Procedure: COLONOSCOPY WITH PROPOFOL;  Surgeon: Lin Landsman, MD;  Location[facility] ENDOSCOPY;  Service: Gastroenterology;  Laterality: N/A;   ESOPHAGOGASTRODUODENOSCOPY (EGD) WITH PROPOFOL  06/17/2021   Procedure: ESOPHAGOGASTRODUODENOSCOPY (EGD) WITH PROPOFOL;  Surgeon: Lin Landsman, MD;  Location[facility] ENDOSCOPY;  Service: Gastroenterology;;   KNEE ARTHROSCOPY WITH LATERAL MENISECTOMY Right 05/08/2020   Procedure: RIGHT KNEE ARTHROSCOPY WITH DEBRIDEMENT AND PARTIAL LATERAL MENISCECTOMY;  Surgeon: Corky Mull, MD;  Location: [facility] ORS;  Service: Orthopedics;  Laterality: Right;   LAPAROSCOPIC APPENDECTOMY N/A 12/21/2018   Procedure: APPENDECTOMY LAPAROSCOPIC CONVERTED TO OPEN;  Surgeon: Lysle Pearl,  Isami, DO;  Location: [facility] ORS;  Service: General;  Laterality: N/A;   TONSILLECTOMY     HPI:  Pt is a 76 y/o F admitted on 08/01/21 with c/c of confusion. Pt had a R TKA on Tuesday & began having confusion & weakness Wednesday PM with pt unable to recall surgery.  MRI showed abnormal restricted diffusion involving the central and left aspect of the anterior genu of  the corpus callosum, with extension to involve the fornices bilaterally. Finding likely reflects changes of an acute ACA distribution infarct. PMH: arthritis, COPD, osteoporosis   Assessment / Plan / Recommendation Clinical Impression  Pt presents with moderate to severe impairments in memory and moderate impairment in semi-complex to complex reasoning. These deficits are further confirmed by pt's performance on the COGNISTAT.  The COGNISTAT is a domain specific test instrument for patients ages 39 to 59, that provides individual scores in the five major cognitive domains:  language, spatial skills, memory, calculations and reasoning. Scores range from Average to Severe.   COGNITIVE STATUS PROFILE   Level of Consciousness: Alert   Orientation: Average   Attention:  Average   Language:  Average   Constructions:  MODERATE   Memory: SEVERE   Calculations: Average   Reasoning: Average   Education provided on results of assessment as well as recommendation that family assist with medication management. Examples of compensatory memory aids were provided. All questions were answered to their satisfaction.     SLP Assessment  SLP Recommendation/Assessment: Patient needs continued Speech [place] [facility] SLP Visit Diagnosis: Cognitive communication deficit (R41.841)    Recommendations for follow up therapy are one component of a multi-disciplinary discharge planning process, led by the attending physician.  Recommendations may be updated based on patient status, additional functional criteria and insurance authorization.    Follow Up Recommendations  Home health SLP    Assistance Recommended at Discharge  Frequent or constant Supervision/Assistance  Functional Status Assessment Patient has had a recent decline in their functional status and demonstrates the ability to make significant improvements in function in a reasonable and predictable amount of time.  Frequency and  Duration           SLP Evaluation Cognition  Overall Cognitive Status: Impaired/Different from baseline Arousal/Alertness: Awake/alert Orientation Level: Oriented to person;Oriented to place;Oriented to situation Year: 2023 Month: January Day of Week: Correct Attention: Selective Selective Attention: Appears intact Memory: Impaired Memory Impairment: Retrieval deficit;Decreased recall of new information;Decreased short term memory Decreased Short Term Memory: Verbal basic;Functional basic Awareness: Appears intact Problem Solving: Appears intact Executive Function: Reasoning Reasoning: Impaired Reasoning Impairment: Verbal complex;Functional complex Safety/Judgment: Appears intact       Comprehension  Auditory Comprehension Overall Auditory Comprehension: Appears within functional limits for tasks assessed Visual Recognition/Discrimination Discrimination: Within Function Limits    Expression Expression Primary Mode of Expression: Verbal Written Expression Dominant Hand: Right Written Expression: Within Functional Limits   Oral / Motor  Oral Motor/Sensory Function Overall Oral Motor/Sensory Function: Within functional limits Motor Speech Overall Motor Speech: Appears within functional limits for tasks assessed           Alexis Reed B. Rutherford Nail M.S., CCC-SLP, [place] Office [phone]  Alexis Reed Rutherford Nail 08/03/2021, 2:01 PM

## 2021-08-03 NOTE — Progress Notes (Signed)
Occupational Therapy Treatment Patient Details Name: Alexis Reed MRN: [id number] DOB: [birth year] Today's Date: 08/03/2021   History of present illness Pt is a 76 y/o F admitted on 08/01/21 with c/c of confusion. Pt had a R TKA on Tuesday & began having confusion & weakness Wednesday PM with pt unable to recall surgery.  MRI showed abnormal restricted diffusion involving the central and left aspect of the anterior genu of the corpus callosum, with extension to involve the fornices bilaterally. Finding likely reflects changes of an acute ACA distribution infarct. PMH: arthritis, COPD, osteoporosis   OT comments  Pt seen for OT Tx this date to f/u re: safety with ADLs/ADL mobility. Pt presents this date reporting pain of 4-5/10 in R knee and upon assessment, R UE hand coordination appears to be improving. She requires CGA/SUPV for ADL Trasnfers with RW and is able to complete ~6' in room with SUPV with RW (close standby for safety). Seated in chair, OT engages pt in ed including demo re: R hand coordination/strengthening exercises with theraputty including digit flexion and extension as well as opposition and isolation. Pt left sitting in chair with all needs met and in reach, handout issued for theraputty exercise. Will continue to follow acutely.    Recommendations for follow up therapy are one component of a multi-disciplinary discharge planning process, led by the attending physician.  Recommendations may be updated based on patient status, additional functional criteria and insurance authorization.    Follow Up Recommendations  Home health OT    Assistance Recommended at Discharge Frequent or constant Supervision/Assistance  Patient can return home with the following  A little help with walking and/or transfers;A little help with bathing/dressing/bathroom;Assistance with cooking/housework;Direct supervision/assist for medications management;Help with stairs or ramp for entrance   Equipment  Recommendations  BSC/3in1    Recommendations for Other Services      Precautions / Restrictions Precautions Precautions: Fall Restrictions Weight Bearing Restrictions: Yes RLE Weight Bearing: Weight bearing as tolerated       Mobility Bed Mobility               General bed mobility comments: pt received & left sitting in recliner    Transfers Overall transfer level: Needs assistance Equipment used: Rolling walker (2 wheels) Transfers: Sit to/from Stand Sit to Stand: Min guard, Supervision     Step pivot transfers: Supervision, Min guard     General transfer comment: cuing for safe hand/foot placement and RW use during session.     Balance Overall balance assessment: Needs assistance Sitting-balance support: Feet supported, No upper extremity supported Sitting balance-Leahy Scale: Good     Standing balance support: During functional activity, Bilateral upper extremity supported Standing balance-Leahy Scale: Fair Standing balance comment: UE support on RW                           ADL either performed or assessed with clinical judgement   ADL Overall ADL's : Needs assistance/impaired                     Lower Body Dressing: Minimal assistance;Sitting/lateral leans Lower Body Dressing Details (indicate cue type and reason): socks, MIN A for R sock             Functional mobility during ADLs: Min guard;Supervision/safety;Rolling walker (2 wheels) (~5-6' in the room)      Extremity/Trunk Assessment  Vision       Perception     Praxis      Cognition Arousal/Alertness: Awake/alert Behavior During Therapy: WFL for tasks assessed/performed Overall Cognitive Status: Impaired/Different from baseline Area of Impairment: Orientation, Attention, Following commands, Awareness, Memory, Problem solving                 Orientation Level: Disoriented to, Time (initially reported year was 2014, but able to retain  that it's 2023 after PT educated pt & pt quizzed multiple times during session)   Memory: Decreased short-term memory Following Commands: Follows one step commands consistently, Follows multi-step commands inconsistently   Awareness: Emergent, Anticipatory   General Comments: Per family at bedside, pt still not back to baseline leve of functional independence. Cognition remains variable with decreases STM and mild confusion.        Exercises Other Exercises Other Exercises: OT ed with pt and family re: R UE theraputty exercise, handout issued.    Shoulder Instructions       General Comments      Pertinent Vitals/ Pain       Pain Assessment Pain Assessment: 0-10 Pain Score: 5  Pain Location: R knee Pain Descriptors / Indicators: Discomfort, Sore Pain Intervention(s): Monitored during session, Repositioned  Home Living     Available Help at Discharge: Family;Available 24 hours/day Type of Home: House                              Lives With: Spouse    Prior Functioning/Environment              Frequency  Min 3X/week        Progress Toward Goals  OT Goals(current goals can now be found in the care plan section)  Progress towards OT goals: Progressing toward goals  Acute Rehab OT Goals Patient Stated Goal: to walk OT Goal Formulation: With patient Time For Goal Achievement: 08/16/21 Potential to Achieve Goals: Good  Plan Discharge plan remains appropriate    Co-evaluation                 AM-PAC OT "6 Clicks" Daily Activity     Outcome Measure   Help from another person eating meals?: A Little Help from another person taking care of personal grooming?: A Little Help from another person toileting, which includes using toliet, bedpan, or urinal?: A Little Help from another person bathing (including washing, rinsing, drying)?: A Little Help from another person to put on and taking off regular upper body clothing?: A Little Help from  another person to put on and taking off regular lower body clothing?: A Little 6 Click Score: 18    End of Session Equipment Utilized During Treatment: Gait belt;Rolling walker (2 wheels)  OT Visit Diagnosis: Other abnormalities of gait and mobility (R26.89);Muscle weakness (generalized) (M62.81)   Activity Tolerance Patient tolerated treatment well   Patient Left in bed;with call bell/phone within reach;with bed alarm set;with family/visitor present   Nurse Communication Mobility status        Time: 1032-1050 OT Time Calculation (min): 18 min  Charges: OT General Charges $OT Visit: 1 Visit OT Treatments $Therapeutic Exercise: 8-22 mins  Gerrianne Scale, [place], OTR/L ascom [phone] 08/03/21, 2:21 PM

## 2021-08-03 NOTE — Progress Notes (Signed)
Physical Therapy Treatment Patient Details Name: Alexis Reed MRN: [id number] DOB: March 22, 1946 Today's Date: 08/03/2021   History of Present Illness Pt is a 76 y/o F admitted on 08/01/21 with c/c of confusion. Pt had a R TKA on Tuesday & began having confusion & weakness Wednesday PM with pt unable to recall surgery.  MRI showed abnormal restricted diffusion involving the central and left aspect of the anterior genu of the corpus callosum, with extension to involve the fornices bilaterally. Finding likely reflects changes of an acute ACA distribution infarct. PMH: arthritis, COPD, osteoporosis    PT Comments    Pt seen for PT tx with family present. Pt appears more alert & happy today. Pt with improving memory but still unable to recall current year without assistance. Pt performs RLE exercises with AAROM 2/2 pain & weakness. Pt is able to perform sit>stand with CGA fade to supervision with good placement of hands for safety. Pt ambulates into hallway & progresses gait distance despite c/o 7/10 R knee pain. Family practiced hands on gait training with pt with PT educating them on gait belt positioning, hand positioning, & caregiver positioning in relation to pt. Pt & family voice comfort with progress & pt's CLOF; daughter reports she has purchased various items (bed alarm, bed pads) to increase pt's safety upon return home.      Recommendations for follow up therapy are one component of a multi-disciplinary discharge planning process, led by the attending physician.  Recommendations may be updated based on patient status, additional functional criteria and insurance authorization.  Follow Up Recommendations  Home health PT (Pt is still appropriate for HHPT f/u but if family feels comfortable going to OPPT pt is appropriate at this time.)     Assistance Recommended at Discharge Frequent or constant Supervision/Assistance  Patient can return home with the following A little help with walking  and/or transfers;A little help with bathing/dressing/bathroom;Assistance with cooking/housework;Direct supervision/assist for financial management;Assist for transportation;Help with stairs or ramp for entrance;Direct supervision/assist for medications management   Equipment Recommendations  BSC/3in1 (shower chair)    Recommendations for Other Services       Precautions / Restrictions Precautions Precautions: Fall Restrictions Weight Bearing Restrictions: Yes RLE Weight Bearing: Weight bearing as tolerated     Mobility  Bed Mobility               General bed mobility comments: pt received & left sitting in recliner    Transfers Overall transfer level: Needs assistance Equipment used: Rolling walker (2 wheels) Transfers: Sit to/from Stand Sit to Stand: Min guard, Supervision                Ambulation/Gait Ambulation/Gait assistance: Supervision, Min guard (CGA fade to supervision) Gait Distance (Feet):  (100 + 20 ft) Assistive device: Rolling walker (2 wheels) Gait Pattern/deviations: Decreased step length - left, Decreased step length - right, Decreased stride length, Decreased weight shift to right Gait velocity: decreased     General Gait Details: Progressing to more normalized gait pattern as compared to yesterday.   Stairs             Wheelchair Mobility    Modified Rankin (Stroke Patients Only)       Balance Overall balance assessment: Needs assistance Sitting-balance support: Feet supported, No upper extremity supported Sitting balance-Leahy Scale: Good     Standing balance support: During functional activity, Bilateral upper extremity supported Standing balance-Leahy Scale: Fair  Cognition Arousal/Alertness: Awake/alert   Overall Cognitive Status: Impaired/Different from baseline Area of Impairment: Orientation, Attention, Following commands, Awareness, Memory, Problem solving                  Orientation Level: Disoriented to, Time (initially reported year was 2014, but able to retain that it's 2023 after PT educated pt & pt quizzed multiple times during session)   Memory: Decreased short-term memory Following Commands: Follows one step commands consistently, Follows multi-step commands inconsistently   Awareness: Emergent, Anticipatory            Exercises Total Joint Exercises Heel Slides: AAROM, Strengthening, Right, 10 reps Straight Leg Raises: AAROM, Strengthening, Right, 10 reps Long Arc Quad: Strengthening, AROM, Right, 10 reps, Seated Knee Flexion: AROM, Right, 10 reps, Seated    General Comments        Pertinent Vitals/Pain Pain Assessment Pain Assessment: 0-10 Pain Score: 7  Pain Location: R knee Pain Descriptors / Indicators: Discomfort, Sore Pain Intervention(s): Monitored during session    Home Living                          Prior Function            PT Goals (current goals can now be found in the care plan section) Acute Rehab PT Goals Patient Stated Goal: get better PT Goal Formulation: With patient/family Time For Goal Achievement: 08/16/21 Potential to Achieve Goals: Fair Progress towards PT goals: Progressing toward goals    Frequency    7X/week      PT Plan  (Pt is still appropriate for HHPT f/u but if family feels comfortable going to OPPT pt is appropriate at this time.)    Co-evaluation              AM-PAC PT "6 Clicks" Mobility   Outcome Measure  Help needed turning from your back to your side while in a flat bed without using bedrails?: None Help needed moving from lying on your back to sitting on the side of a flat bed without using bedrails?: None Help needed moving to and from a bed to a chair (including a wheelchair)?: A Little Help needed standing up from a chair using your arms (e.g., wheelchair or bedside chair)?: A Little Help needed to walk in hospital room?: A Little Help needed  climbing 3-5 steps with a railing? : A Little 6 Click Score: 20    End of Session Equipment Utilized During Treatment: Gait belt Activity Tolerance: Patient tolerated treatment well Patient left: in chair;with call bell/phone within reach;with family/visitor present Nurse Communication: Mobility status PT Visit Diagnosis: Unsteadiness on feet (R26.81);Muscle weakness (generalized) (M62.81);Difficulty in walking, not elsewhere classified (R26.2);Pain Pain - Right/Left: Right Pain - part of body: Knee     Time: 8295-6213 PT Time Calculation (min) (ACUTE ONLY): 24 min  Charges:  $Therapeutic Exercise: 8-22 mins $Therapeutic Activity: 8-22 mins                     Lavone Nian, PT, DPT 08/03/21, 12:54 PM    Waunita Schooner 08/03/2021, 12:50 PM

## 2021-08-03 NOTE — Discharge Summary (Addendum)
Physician Discharge Summary   Alexis Reed  female DOB: 1945-09-23  HUT:[id number]  PCP: Einar Pheasant, MD  Admit date: 08/01/2021 Discharge date: 08/03/2021  Admitted From: home Disposition:  home Family updated at bedside prior to discharge.  Home Health: Yes CODE STATUS: Full code  Discharge Instructions     Ambulatory referral to Neurology   Complete by: As directed    Stroke hospital f/u 4-6 wks   Discharge instructions   Complete by: As directed    You have had a stroke.  Per neurologist recommendation, please take aspirin 81 mg and plavix 75 mg together for 21 days, and then just aspirin 81 mg daily alone.   Dr. Enzo Bi [facility] Course:  For full details, please see H&P, progress notes, consult notes and ancillary notes.  Briefly,  Alexis Reed is a 76 y.o. female seen in ed with complaints of AMS Right TKA on Tuesday (07/30/21) and Wed pm started confusion and weakness. Pt has been sleeping and with decreased urine output per daughter.    AMS likely 2/2 Acute corpus callosal infarct --mental status appeared improved.  Per neuro Dr. Quinn Axe, Infarct in this area can cause AMS without focal deficits. Her sx overall are mild.   --CTA head and neck showed No intracranial large vessel occlusion or significant stenosis.  Carotid US showed no significant stenosis of internal carotid arteries. --per neuro, rec aspirin 81 mg and plavix 75 mg together for 21 days, and then just aspirin 81 mg daily alone. --cont home statin - Amb referral to neurology upon discharge made by Dr. Quinn Axe.   GERD: --cont PPI   COPD: Stable.    Edema of right leg: Venous doppler neg for DVT.  Likely due to recent surgery. --compression stocking   Anemia: From her recent surgery and blood loss.   Recent right knee surgery --surgical dressing intact.   --outpatient f/u with ortho as previously scheduled.   Discharge Diagnoses:  Principal Problem:   AMS  (altered mental status) Active Problems:   GERD (gastroesophageal reflux disease)   COPD (chronic obstructive pulmonary disease) (HCC)   Edema of right lower leg   Acute CVA (cerebrovascular accident) ([place])   30 Day Unplanned Readmission Risk Score    Flowsheet Row ED to Hosp-Admission (Current) from 08/01/2021 in [facility] (1C)  30 Day Unplanned Readmission Risk Score (%) 13.73 Filed at 08/03/2021 1200       This score is the patient's risk of an unplanned readmission within 30 days of being discharged (0 -100%). The score is based on dignosis, age, lab data, medications, orders, and past utilization.   Low:  0-14.9   Medium: 15-21.9   High: 22-29.9   Extreme: 30 and above         Discharge Instructions:  Allergies as of 08/03/2021       Reactions   Benzoin Compound Other (See Comments)   After breast surgery After breast surgery   Boniva [ibandronic Acid] Other (See Comments)   Aching   Codeine Nausea Only   Fosamax [alendronate Sodium]    Caused aching   Lipitor [atorvastatin] Hives   Meloxicam Other (See Comments)   GI Upset   Penicillins Swelling   Facial swelling Did it involve swelling of the face/tongue/throat, SOB, or low BP? Yes Did it involve sudden or severe rash/hives, skin peeling, or any reaction on the inside of your mouth or nose? No  Did you need to seek medical attention at a hospital or doctor's office? No When did it last happen?      within the last 10 years If all above answers are NO, may proceed with cephalosporin use.        Medication List     TAKE these medications    acetaminophen 500 MG tablet Commonly known as: TYLENOL Take 1,000 mg by mouth every 6 (six) hours as needed for moderate pain.   Advair HFA 230-21 MCG/ACT inhaler Generic drug: fluticasone-salmeterol Inhale 2 puffs into the lungs 2 (two) times daily.   aspirin 81 MG EC tablet Take 1 tablet (81 mg total) by mouth daily. Swallow  whole. Start taking on: August 04, 2021 What changed:  medication strength how much to take additional instructions   Calcium 500 +D 500-10 MG-MCG Tabs Generic drug: Calcium Carb-Cholecalciferol Take 1 tablet by mouth daily.   clopidogrel 75 MG tablet Commonly known as: PLAVIX Take 1 tablet (75 mg total) by mouth daily for 21 days. Start taking on: August 04, 2021   Combivent Respimat 20-100 MCG/ACT Aers respimat Generic drug: Ipratropium-Albuterol Inhale 1 puff into the lungs every 6 (six) hours as needed for wheezing.   Cranberry 450 MG Caps Take 450 mg by mouth daily.   denosumab 60 MG/ML Sosy injection Commonly known as: PROLIA Inject 60 mg into the skin every 6 (six) months.   gabapentin 300 MG capsule Commonly known as: NEURONTIN Take 300 mg by mouth 3 (three) times daily.   methocarbamol 500 MG tablet Commonly known as: ROBAXIN Take 500 mg by mouth every 8 (eight) hours as needed for muscle spasms.   omeprazole 20 MG capsule Commonly known as: PRILOSEC Take 1 capsule (20 mg total) by mouth daily.   ondansetron 4 MG tablet Commonly known as: ZOFRAN Take 8 mg by mouth every 8 (eight) hours as needed for nausea or vomiting.   oxyCODONE 5 MG immediate release tablet Commonly known as: Oxy IR/ROXICODONE Take 5 mg by mouth every 6 (six) hours as needed for moderate pain.   pravastatin 40 MG tablet Commonly known as: PRAVACHOL Take 1 tablet (40 mg total) by mouth daily.   PROBIOTIC DAILY PO Take 1 capsule by mouth daily.   traMADol 50 MG tablet Commonly known as: ULTRAM Take 50-100 mg by mouth every 6 (six) hours as needed for moderate pain.   Vitamin D 50 MCG (2000 UT) tablet Take 2,000 Units by mouth daily.         Follow-up Information     Einar Pheasant, MD Follow up in 1 week(s).   Specialty: Internal Medicine Contact information: [address] [place] [address] [phone]                 Allergies   Allergen Reactions   Benzoin Compound Other (See Comments)    After breast surgery After breast surgery   Boniva [Ibandronic Acid] Other (See Comments)    Aching    Codeine Nausea Only   Fosamax [Alendronate Sodium]     Caused aching   Lipitor [Atorvastatin] Hives   Meloxicam Other (See Comments)    GI Upset   Penicillins Swelling    Facial swelling Did it involve swelling of the face/tongue/throat, SOB, or low BP? Yes Did it involve sudden or severe rash/hives, skin peeling, or any reaction on the inside of your mouth or nose? No Did you need to seek medical attention at a hospital or doctor's office? No  When did it last happen?      within the last 10 years If all above answers are NO, may proceed with cephalosporin use.      The results of significant diagnostics from this hospitalization (including imaging, microbiology, ancillary and laboratory) are listed below for reference.   Consultations:   Procedures/Studies: CT ANGIO HEAD NECK W WO CM  Result Date: 08/02/2021 CLINICAL DATA:  Stroke suspected, follow-up EXAM: CT ANGIOGRAPHY HEAD AND NECK TECHNIQUE: Multidetector CT imaging of the head and neck was performed using the standard protocol during bolus administration of intravenous contrast. Multiplanar CT image reconstructions and MIPs were obtained to evaluate the vascular anatomy. Carotid stenosis measurements (when applicable) are obtained utilizing NASCET criteria, using the distal internal carotid diameter as the denominator. RADIATION DOSE REDUCTION: This exam was performed according to the departmental dose-optimization program which includes automated exposure control, adjustment of the mA and/or kV according to patient size and/or use of iterative reconstruction technique. CONTRAST:  30mL OMNIPAQUE IOHEXOL 350 MG/ML SOLN COMPARISON:  No prior CTA, correlation is made with CT head 08/01/2021, MRI head 08/01/2021, and CT neck 05/28/2020 FINDINGS: CT HEAD FINDINGS  Brain: Mild focal hypodensity in the anterior left corpus callosum, which correlates with the restricted diffusion seen on the 08/01/2021 MRI. No other new hypodensity to suggest additional acute infarct. No acute hemorrhage, mass, mass effect, or midline shift. No hydrocephalus or extra-axial collection. Vascular: Please see CTA findings below Skull: Normal. Negative for fracture or focal lesion. Sinuses: Imaged portions are clear. Orbits: No acute finding. Review of the MIP images confirms the above findings CTA NECK FINDINGS Aortic arch: Standard branching. Imaged portion shows no evidence of aneurysm or dissection. No significant stenosis of the major arch vessel origins. Right carotid system: No evidence of dissection, stenosis (50% or greater) or occlusion. Retropharyngeal course of the ICA. Left carotid system: No evidence of dissection, stenosis (50% or greater) or occlusion. Vertebral arteries: No evidence of dissection, stenosis (50% or greater) or occlusion. Skeleton: No acute osseous abnormality. Other neck: 2.0 cm hypoenhancing nodule in the posterior right thyroid lobe, which correlates with the lesion evaluated on the 06/11/2020 ultrasound and appears similar to the 05/28/2020 CT neck. Upper chest: Small right pleural effusion with associated atelectasis. Centrilobular emphysema. No left pleural effusion. Review of the MIP images confirms the above findings CTA HEAD FINDINGS Anterior circulation: Both internal carotid arteries are patent to the termini, with mild calcifications but without significant stenosis. A1 segments patent. Normal anterior communicating artery. Anterior cerebral arteries are patent to their distal aspects. No M1 stenosis or occlusion. Normal MCA bifurcations. Distal MCA branches perfused and symmetric. Posterior circulation: Vertebral arteries patent to the vertebrobasilar junction without stenosis. Basilar patent to its distal aspect. Superior cerebellar arteries patent  bilaterally. Bilateral P1 segments originate from the basilar artery. Bilateral posterior communicating arteries are patent. PCAs perfused to their distal aspects without stenosis. Venous sinuses: As permitted by contrast timing, patent. Anatomic variants: None significant Review of the MIP images confirms the above findings IMPRESSION: 1. Hypodensity in the anterior left corpus callosum correlates with the restricted diffusion seen on the 08/01/2021 MRI. No other acute intracranial process. 2.  No intracranial large vessel occlusion or significant stenosis. 3.  No hemodynamically significant stenosis in the neck. 4. Small right pleural effusion. 5. 1.9 cm nodule in the posterior right thyroid lobe, which correlates with a lesion last imaged on a 06/11/2020 ultrasound of the thyroid and appears unchanged compared to the 05/28/2020 CT neck. Further  follow-up with ultrasound could be obtained if clinically indicated. 6.  Emphysema (ICD10-J43.9). Electronically Signed   By: Merilyn Baba M.D.   On: 08/02/2021 16:56   DG Chest 1 View  Result Date: 08/01/2021 CLINICAL DATA:  confusion, low oxygen EXAM: CHEST  1 VIEW COMPARISON:  Chest x-ray 01/18/2014. CT chest 12/03/2020 FINDINGS: The heart and mediastinal contours are within normal limits. Biapical pleural/pulmonary scarring. No focal consolidation. Slightly more prominent chronic coarsened interstitial markings suggestive of overlying acute bronchitic changes. No pleural effusion. No pneumothorax. No acute osseous abnormality. IMPRESSION: 1. Likely acute on chronic bronchitic changes. 2.  Emphysema (ICD10-J43.9). Electronically Signed   By: Iven Finn M.D.   On: 08/01/2021 17:03   CT Head Wo Contrast  Result Date: 08/01/2021 CLINICAL DATA:  Altered mental status EXAM: CT HEAD WITHOUT CONTRAST TECHNIQUE: Contiguous axial images were obtained from the base of the skull through the vertex without intravenous contrast. RADIATION DOSE REDUCTION: This exam was  performed according to the departmental dose-optimization program which includes automated exposure control, adjustment of the mA and/or kV according to patient size and/or use of iterative reconstruction technique. COMPARISON:  Overlapping portion of CT neck 05/28/2020 FINDINGS: Brain: The brainstem, cerebellum, cerebral peduncles, thalami, basal ganglia, basilar cisterns, and ventricular system appear within normal limits. No intracranial hemorrhage, mass lesion, or acute CVA. Vascular: There is atherosclerotic calcification of the cavernous carotid arteries bilaterally. Skull: Unremarkable Sinuses/Orbits: Unremarkable where included. Other: Unremarkable IMPRESSION: 1. No acute intracranial findings. 2. Atherosclerosis. Electronically Signed   By: Van Clines M.D.   On: 08/01/2021 17:30   MR BRAIN WO CONTRAST  Result Date: 08/01/2021 CLINICAL DATA:  Initial evaluation for acute mental status change. EXAM: MRI HEAD WITHOUT CONTRAST TECHNIQUE: Multiplanar, multiecho pulse sequences of the brain and surrounding structures were obtained without intravenous contrast. COMPARISON:  Comparison made with prior head CT from earlier the same day. FINDINGS: Brain: Examination moderately degraded by motion artifact Cerebral volume within normal limits for age. No significant cerebral white matter disease seen on this motion degraded exam. Abnormal restricted diffusion seen involving the central and left aspect of the anterior genu of the corpus callosum (series 5, image 26). Inferior extension to involve the fornices bilaterally (series 5, image 24). Finding likely reflects changes of an acute infarct, ACA distribution. No associated hemorrhage or significant mass effect. No other diffusion abnormality to suggest acute or subacute ischemia. Gray-white matter differentiation otherwise maintained. No areas of encephalomalacia to suggest chronic cortical infarction elsewhere within the brain. No other acute or chronic  intracranial blood products. No mass lesion, midline shift or mass effect. No hydrocephalus or extra-axial fluid collection. Partially empty sella noted. Midline structures intact. Vascular: Major intracranial vascular flow voids are maintained. Skull and upper cervical spine: Craniocervical junction within normal limits. Bone marrow signal intensity normal. No scalp soft tissue abnormality. Sinuses/Orbits: Increased CSF fluid signal intensity seen along the optic nerve sheaths bilaterally with mild flattening at the posterior globes (series 10, image 8). Globes and orbital soft tissues otherwise unremarkable. Mild mucosal thickening noted about the ethmoidal air cells. Paranasal sinuses are otherwise clear. No mastoid effusion. Inner ear structures grossly normal. Other: None. IMPRESSION: 1. Abnormal restricted diffusion involving the central and left aspect of the anterior genu of the corpus callosum, with extension to involve the fornices bilaterally. Finding likely reflects changes of an acute ACA distribution infarct. No associated hemorrhage or significant mass effect. 2. Partially empty sella with increased CSF signal intensity along the optic nerve sheaths bilaterally.  While these findings are nonspecific and may be of no significance, changes such as these can also be seen in the setting of idiopathic intracranial hypertension. 3. Otherwise unremarkable brain MRI for age. Electronically Signed   By: Jeannine Boga M.D.   On: 08/01/2021 23:06   CT ABDOMEN PELVIS W CONTRAST  Result Date: 08/01/2021 CLINICAL DATA:  History of recent right total knee arthroplasty. Postop abdominal pain and altered mental status. EXAM: CT ABDOMEN AND PELVIS WITH CONTRAST TECHNIQUE: Multidetector CT imaging of the abdomen and pelvis was performed using the standard protocol following bolus administration of intravenous contrast. RADIATION DOSE REDUCTION: This exam was performed according to the departmental  dose-optimization program which includes automated exposure control, adjustment of the mA and/or kV according to patient size and/or use of iterative reconstruction technique. CONTRAST:  161mL OMNIPAQUE IOHEXOL 300 MG/ML  SOLN COMPARISON:  CT scan 01/28/2019 FINDINGS: Lower chest: Streaky subsegmental basilar atelectasis. No infiltrates or effusions. The heart is normal in size. No pericardial effusion. Hepatobiliary: No hepatic lesions or intrahepatic biliary dilatation. Gallbladder is unremarkable. No common bile duct dilatation. Pancreas: No mass, inflammation or ductal dilatation. Spleen: Normal size.  No focal lesions. Adrenals/Urinary Tract: Adrenal glands and kidneys are unremarkable. No renal lesions or hydronephrosis. Stable renal cysts. The bladder is decompressed by a Foley catheter. Stomach/Bowel: The stomach, duodenum, small bowel and colon are grossly normal. No acute inflammatory changes, mass lesions or obstructive findings. Vascular/Lymphatic: Stable atherosclerotic calcifications involving the aorta and branch vessels. No aneurysm or dissection. The major venous structures are patent. No mesenteric or retroperitoneal mass or adenopathy. Reproductive: Surgically absent. Other: No pelvic mass or adenopathy. No free pelvic fluid collections. No inguinal mass or adenopathy. No abdominal wall hernia or subcutaneous lesions. There is fluid and gas in the anterior upper thigh musculature likely related to the recent knee surgery. Musculoskeletal: No significant bony findings. IMPRESSION: 1. No acute abdominal/pelvic findings, mass lesions or adenopathy. 2. Fluid and gas in the anterior upper thigh musculature likely related to the recent knee surgery. Aortic Atherosclerosis (ICD10-I70.0). Electronically Signed   By: Marijo Sanes M.D.   On: 08/01/2021 17:35   US Carotid Bilateral  Result Date: 08/02/2021 CLINICAL DATA:  Acute CVA EXAM: BILATERAL CAROTID DUPLEX ULTRASOUND TECHNIQUE: Pearline Cables scale imaging,  color Doppler and duplex ultrasound were performed of bilateral carotid and vertebral arteries in the neck. COMPARISON:  None. FINDINGS: Criteria: Quantification of carotid stenosis is based on velocity parameters that correlate the residual internal carotid diameter with NASCET-based stenosis levels, using the diameter of the distal internal carotid lumen as the denominator for stenosis measurement. The following velocity measurements were obtained: RIGHT ICA: 134/35 cm/sec CCA: 841/66 cm/sec SYSTOLIC ICA/CCA RATIO:  1.3 ECA: 114 cm/sec LEFT ICA: 120/39 cm/sec CCA: 06/30 cm/sec SYSTOLIC ICA/CCA RATIO:  1.3 ECA: 99 cm/sec RIGHT CAROTID ARTERY: Mildly elevated peak systolic velocity is likely artifact given only minimal atheromatous plaque of the carotid bifurcation. RIGHT VERTEBRAL ARTERY:  Antegrade flow. LEFT CAROTID ARTERY: Minimal atheromatous plaque of the carotid bifurcation. LEFT VERTEBRAL ARTERY:  Antegrade flow. IMPRESSION: Less than 50% stenosis of the internal carotid arteries. Electronically Signed   By: Miachel Roux M.D.   On: 08/02/2021 11:00   US Venous Img Lower Bilateral (DVT)  Result Date: 08/02/2021 CLINICAL DATA:  Lower extremity swelling and pain Recent knee surgery EXAM: BILATERAL LOWER EXTREMITY VENOUS DOPPLER ULTRASOUND TECHNIQUE: Gray-scale sonography with compression, as well as color and duplex ultrasound, were performed to evaluate the deep venous system(s) from the  level of the common femoral vein through the popliteal and proximal calf veins. COMPARISON:  None. FINDINGS: VENOUS Normal compressibility of the common femoral, superficial femoral, and popliteal veins, as well as the visualized calf veins. Visualized portions of profunda femoral vein and great saphenous vein unremarkable. No filling defects to suggest DVT on grayscale or color Doppler imaging. Doppler waveforms show normal direction of venous flow, normal respiratory plasticity and response to augmentation. OTHER None.  Limitations: none IMPRESSION: No lower extremity DVT. Electronically Signed   By: Miachel Roux M.D.   On: 08/02/2021 10:44   ECHOCARDIOGRAM COMPLETE  Result Date: 08/02/2021    ECHOCARDIOGRAM REPORT   Patient Name:   Alexis Reed Date of Exam: 08/02/2021 Medical Rec #:  [id number]        Height:       63.0 in Accession #:    [id number]       Weight:       188.0 lb Date of Birth:  Apr 09, 1946        BSA:          1.883 m Patient Age:    29 years         BP:           99/45 mmHg Patient Gender: F                HR:           103 bpm. Exam Location:  [facility] Procedure: 2D Echo, Color Doppler and Cardiac Doppler Indications:     I63.9 Stroke  History:         Patient has no prior history of Echocardiogram examinations.                  COPD; Risk Factors:Current Smoker.  Sonographer:     Sherrie Sport Referring Phys:  [place] Diagnosing Phys: Serafina Royals MD  Sonographer Comments: Suboptimal apical window and suboptimal subcostal window. Image acquisition challenging due to COPD. IMPRESSIONS  1. Left ventricular ejection fraction, by estimation, is 60 to 65%. The left ventricle has normal function. The left ventricle has no regional wall motion abnormalities. Left ventricular diastolic parameters were normal.  2. Right ventricular systolic function is normal. The right ventricular size is normal.  3. The mitral valve is normal in structure. Trivial mitral valve regurgitation.  4. The aortic valve is normal in structure. Aortic valve regurgitation is trivial. FINDINGS  Left Ventricle: Left ventricular ejection fraction, by estimation, is 60 to 65%. The left ventricle has normal function. The left ventricle has no regional wall motion abnormalities. The left ventricular internal cavity size was normal in size. There is  no left ventricular hypertrophy. Left ventricular diastolic parameters were normal. Right Ventricle: The right ventricular size is normal. No increase in right ventricular wall thickness.  Right ventricular systolic function is normal. Left Atrium: Left atrial size was normal in size. Right Atrium: Right atrial size was normal in size. Pericardium: There is no evidence of pericardial effusion. Mitral Valve: The mitral valve is normal in structure. Trivial mitral valve regurgitation. MV peak gradient, 4.2 mmHg. The mean mitral valve gradient is 2.0 mmHg. Tricuspid Valve: The tricuspid valve is normal in structure. Tricuspid valve regurgitation is trivial. Aortic Valve: The aortic valve is normal in structure. Aortic valve regurgitation is trivial. Aortic valve mean gradient measures 4.0 mmHg. Aortic valve peak gradient measures 8.4 mmHg. Aortic valve area, by VTI measures 3.08 cm. Pulmonic Valve: The pulmonic valve was  normal in structure. Pulmonic valve regurgitation is not visualized. Aorta: The aortic root and ascending aorta are structurally normal, with no evidence of dilitation. IAS/Shunts: No atrial level shunt detected by color flow Doppler.  LEFT VENTRICLE PLAX 2D LVIDd:         3.30 cm   Diastology LVIDs:         2.35 cm   LV e' medial:    7.29 cm/s LV PW:         0.90 cm   LV E/e' medial:  13.5 LV IVS:        0.95 cm   LV e' lateral:   10.30 cm/s LVOT diam:     2.00 cm   LV E/e' lateral: 9.6 LV SV:         74 LV SV Index:   39 LVOT Area:     3.14 cm  RIGHT VENTRICLE RV Basal diam:  3.10 cm RV S prime:     15.90 cm/s TAPSE (M-mode): 2.2 cm LEFT ATRIUM             Index        RIGHT ATRIUM           Index LA diam:        3.10 cm 1.65 cm/m   RA Area:     13.20 cm LA Vol (A2C):   47.2 ml 25.06 ml/m  RA Volume:   35.00 ml  18.58 ml/m LA Vol (A4C):   32.3 ml 17.15 ml/m LA Biplane Vol: 43.0 ml 22.83 ml/m  AORTIC VALVE                    PULMONIC VALVE AV Area (Vmax):    2.84 cm     PV Vmax:        0.82 m/s AV Area (Vmean):   2.79 cm     PV Vmean:       57.300 cm/s AV Area (VTI):     3.08 cm     PV VTI:         0.160 m AV Vmax:           145.00 cm/s  PV Peak grad:   2.7 mmHg AV Vmean:           96.500 cm/s  PV Mean grad:   2.0 mmHg AV VTI:            0.241 m      RVOT Peak grad: 4 mmHg AV Peak Grad:      8.4 mmHg AV Mean Grad:      4.0 mmHg LVOT Vmax:         131.00 cm/s LVOT Vmean:        85.700 cm/s LVOT VTI:          0.236 m LVOT/AV VTI ratio: 0.98  AORTA Ao Root diam: 3.10 cm MITRAL VALVE               TRICUSPID VALVE MV Area (PHT): 4.12 cm    TR Peak grad:   18.3 mmHg MV Area VTI:   2.87 cm    TR Vmax:        214.00 cm/s MV Peak grad:  4.2 mmHg MV Mean grad:  2.0 mmHg    SHUNTS MV Vmax:       1.03 m/s    Systemic VTI:  0.24 m MV Vmean:      74.1 cm/s   Systemic Diam: 2.00 cm MV  Decel Time: 184 msec    Pulmonic VTI:  0.156 m MV E velocity: 98.50 cm/s MV A velocity: 67.30 cm/s MV E/A ratio:  1.46 Serafina Royals MD Electronically signed by Serafina Royals MD Signature Date/Time: 08/02/2021/4:06:47 PM    Final       Labs: BNP (last 3 results) No results for input(s): BNP in the last 8760 hours. Basic Metabolic Panel: Recent Labs  Lab 08/01/21 1429 08/03/21 0639  NA 128* 136  K 4.3 3.6  CL 95* 98  CO2 26 27  GLUCOSE 115* 106*  BUN 23 8  CREATININE 0.93 0.53  CALCIUM 8.0* 7.7*  MG  --  2.6*   Liver Function Tests: No results for input(s): AST, ALT, ALKPHOS, BILITOT, PROT, ALBUMIN in the last 168 hours. No results for input(s): LIPASE, AMYLASE in the last 168 hours. No results for input(s): AMMONIA in the last 168 hours. CBC: Recent Labs  Lab 08/01/21 1429 08/03/21 0639  WBC 9.4 5.5  HGB 9.6* 8.7*  HCT 30.4* 27.1*  MCV 97.4 94.4  PLT 250 259   Cardiac Enzymes: No results for input(s): CKTOTAL, CKMB, CKMBINDEX, TROPONINI in the last 168 hours. BNP: Invalid input(s): POCBNP CBG: No results for input(s): GLUCAP in the last 168 hours. D-Dimer No results for input(s): DDIMER in the last 72 hours. Hgb A1c Recent Labs    08/02/21 0646  HGBA1C 5.5   Lipid Profile Recent Labs    08/02/21 0646  CHOL 125  HDL 47  LDLCALC 66  TRIG 61  CHOLHDL 2.7    Thyroid function studies Recent Labs    08/02/21 0124  TSH 1.571   Anemia work up Recent Labs    08/02/21 0124  VITAMINB12 217   Urinalysis    Component Value Date/Time   COLORURINE YELLOW (A) 08/01/2021 1429   APPEARANCEUR CLEAR (A) 08/01/2021 1429   LABSPEC 1.021 08/01/2021 1429   PHURINE 5.0 08/01/2021 1429   GLUCOSEU 50 (A) 08/01/2021 1429   GLUCOSEU NEGATIVE 07/23/2021 1147   [place] 08/01/2021 1429   BILIRUBINUR NEGATIVE 08/01/2021 1429   BILIRUBINUR negative 02/19/2021 1404   BILIRUBINUR 1+ 05/07/2020 1223   KETONESUR NEGATIVE 08/01/2021 1429   PROTEINUR NEGATIVE 08/01/2021 1429   UROBILINOGEN 0.2 07/23/2021 1147   NITRITE NEGATIVE 08/01/2021 1429   LEUKOCYTESUR NEGATIVE 08/01/2021 1429   Sepsis Labs Invalid input(s): PROCALCITONIN,  WBC,  LACTICIDVEN Microbiology Recent Results (from the past 240 hour(s))  Urine Culture     Status: None   Collection Time: 08/01/21  2:29 PM   Specimen: Urine, Catheterized  Result Value Ref Range Status   Specimen Description   Final    URINE, CATHETERIZED Performed at [facility], [address], [place], [place] [address]    Special Requests   Final    NONE Performed at [facility], [address] [place], [place] [address]    Culture   Final    NO GROWTH Performed at [facility], [place] [address], [place], [place] [address]    Report Status 08/03/2021 FINAL  Final  Resp Panel by RT-PCR (Flu A&B, Covid) Nasopharyngeal Swab     Status: None   Collection Time: 08/01/21  5:01 PM   Specimen: Nasopharyngeal Swab; Nasopharyngeal(NP) swabs in vial transport medium  Result Value Ref Range Status   SARS Coronavirus 2 by RT PCR NEGATIVE NEGATIVE Final    Comment: (NOTE) SARS-CoV-2 target nucleic acids are NOT DETECTED.  The SARS-CoV-2 RNA is generally detectable in upper  respiratory specimens during the acute phase of infection. The lowest concentration of SARS-CoV-2 viral copies this  assay can detect is 138 copies/mL. A negative result does not preclude SARS-Cov-2 infection and should not be used as the sole basis for treatment or other patient management decisions. A negative result may occur with  improper specimen collection/handling, submission of specimen other than nasopharyngeal swab, presence of viral mutation(s) within the areas targeted by this assay, and inadequate number of viral copies(<138 copies/mL). A negative result must be combined with clinical observations, patient history, and epidemiological information. The expected result is Negative.  Fact Sheet for Patients:  EntrepreneurPulse.com.au  Fact Sheet for Healthcare Providers:  IncredibleEmployment.be  This test is no t yet approved or cleared by the [place] FDA and  has been authorized for detection and/or diagnosis of SARS-CoV-2 by FDA under an Emergency Use Authorization (EUA). This EUA will remain  in effect (meaning this test can be used) for the duration of the COVID-19 declaration under Section 564(b)(1) of the Act, 21 U.S.C.section 360bbb-3(b)(1), unless the authorization is terminated  or revoked sooner.       Influenza A by PCR NEGATIVE NEGATIVE Final   Influenza B by PCR NEGATIVE NEGATIVE Final    Comment: (NOTE) The Xpert Xpress SARS-CoV-2/FLU/RSV plus assay is intended as an aid in the diagnosis of influenza from Nasopharyngeal swab specimens and should not be used as a sole basis for treatment. Nasal washings and aspirates are unacceptable for Xpert Xpress SARS-CoV-2/FLU/RSV testing.  Fact Sheet for Patients: EntrepreneurPulse.com.au  Fact Sheet for Healthcare Providers: IncredibleEmployment.be  This test is not yet approved or cleared by the [place] FDA and has been authorized for detection and/or diagnosis of SARS-CoV-2 by FDA under an Emergency Use Authorization (EUA). This EUA will  remain in effect (meaning this test can be used) for the duration of the COVID-19 declaration under Section 564(b)(1) of the Act, 21 U.S.C. section 360bbb-3(b)(1), unless the authorization is terminated or revoked.  Performed at [facility], [address] [place], [place] [address]      Total time spend on discharging this patient, including the last patient exam, discussing the hospital stay, instructions for ongoing care as it relates to all pertinent caregivers, as well as preparing the medical discharge records, prescriptions, and/or referrals as applicable, is 35 minutes.    Enzo Bi, MD  [facility] 08/03/2021, 12:53 PM

## 2021-08-05 ENCOUNTER — Telehealth: Payer: Self-pay | Admitting: Internal Medicine

## 2021-08-05 NOTE — Telephone Encounter (Signed)
Patient's daughter called, patient was released over the weekend, she had a stroke. Daughter would like patient to be seen this week to go over mediations and patient's health.

## 2021-08-05 NOTE — Telephone Encounter (Signed)
Consult Notes - No Documents" Agustina Caroli said on Aug 05, 2021 2:50 PM  "Pt cancelled appt and did not reschedule" Agustina Caroli said on Aug 05, 2021 2:50 PM  Pt appt was on 08/05/2021 at 11 am  Msg from [facility] gastro

## 2021-08-05 NOTE — Telephone Encounter (Signed)
Spoke with patients daughter, confirmed doing ok. Advised that I was looking for work in appt for patient to be able to come in and referral to Dr Manuella Ghazi could be placed and we would go over medications. Do you want to see her Wednesday at 10 or Thursday at 12?

## 2021-08-05 NOTE — Telephone Encounter (Signed)
I had sent a message stating work in at 12:30 on Wednesday, but I did not realize there was a 10:00 appt.  Ok to work in.

## 2021-08-05 NOTE — Telephone Encounter (Signed)
Pt daughter came into office stating that the cma had questions for her about her mom. Pt daughter wanted to make an appt for pt to go over her health and medications. Pt daughter is also requesting a referral to Dr. Brigitte Pulse. Pt mom had a stroke.

## 2021-08-06 ENCOUNTER — Telehealth: Payer: Self-pay

## 2021-08-06 NOTE — Telephone Encounter (Signed)
Transition Care Management Follow-up Telephone Call Date of discharge and from where: 08/03/21 [facility] How have you been since you were released from the hospital? Patient states, "Knee is sore, pain tolerable with Tramadol BID. No BM since discharge, drinking miralax daily; plan to increase dose prn. The confusion seems to have fixed itself. I can recall, remember names and feel like at baseline in that area." Denies ABD pain, confusion, bleeding, fever, and all other symptoms.    Any questions or concerns? No  Items Reviewed: Did the pt receive and understand the discharge instructions provided? Yes  Medications obtained and verified? Yes . Taking as directed and without issue.  Any new allergies since your discharge? No  Dietary orders reviewed? Yes Do you have support at home? Yes   Home Care and Equipment/Supplies: Were home health PT services ordered? Yes. Starting tomorrow afternoon.   Functional Questionnaire: (I = Independent and D = Dependent) ADLs: Husband assist  Maintaining continence- I  Transferring/Ambulation- walker  Managing Meds- I  Follow up appointments reviewed:  PCP Hospital f/u appt confirmed? Yes  Scheduled to see PCP on 08/07/21 @ 10:00. Are transportation arrangements needed? No  If their condition worsens, is the pt aware to call PCP or go to the Emergency Dept.? Yes Was the patient provided with contact information for the PCP's office or ED? Yes Was to pt encouraged to call back with questions or concerns? Yes

## 2021-08-06 NOTE — Telephone Encounter (Signed)
Pt scheduled  

## 2021-08-07 ENCOUNTER — Ambulatory Visit (INDEPENDENT_AMBULATORY_CARE_PROVIDER_SITE_OTHER): Payer: Medicare HMO | Admitting: Internal Medicine

## 2021-08-07 ENCOUNTER — Encounter: Payer: Self-pay | Admitting: Internal Medicine

## 2021-08-07 ENCOUNTER — Other Ambulatory Visit: Payer: Self-pay

## 2021-08-07 DIAGNOSIS — J449 Chronic obstructive pulmonary disease, unspecified: Secondary | ICD-10-CM

## 2021-08-07 DIAGNOSIS — Z8673 Personal history of transient ischemic attack (TIA), and cerebral infarction without residual deficits: Secondary | ICD-10-CM

## 2021-08-07 DIAGNOSIS — E041 Nontoxic single thyroid nodule: Secondary | ICD-10-CM

## 2021-08-07 DIAGNOSIS — K219 Gastro-esophageal reflux disease without esophagitis: Secondary | ICD-10-CM

## 2021-08-07 DIAGNOSIS — E7800 Pure hypercholesterolemia, unspecified: Secondary | ICD-10-CM

## 2021-08-07 NOTE — Progress Notes (Signed)
Patient ID: Alexis Reed, female   DOB: October 29, 1945, 76 y.o.   MRN: [id number]   Subjective:    Patient ID: Alexis Reed, female    DOB: [birth year], 76 y.o.   MRN: [id number]  This visit occurred during the SARS-CoV-2 public health emergency.  Safety protocols were in place, including screening questions prior to the visit, additional usage of staff PPE, and extensive cleaning of exam room while observing appropriate contact time as indicated for disinfecting solutions.   Patient here for hospital follow up .   HPI Admitted 08/02/19 - 08/03/21 - with AMS.  S/p right TKA 07/30/21.  Confusion started 24 hours after discharge.  Found to have acute corpus callosum infarct - mental status changes - improved during hospitalization.  CTA head and neck showed No intracranial large vessel occlusion or significant stenosis.  Carotid US showed no significant stenosis of internal carotid arteries. Per neuro, rec aspirin 81 mg and plavix 75 mg together for 21 days, and then just aspirin 81 mg daily alone. --cont home statin.  Recommended oupt follow up with neurology.  Since being home, mental status has improved.  Answering question more appropriately.  Not sleeping well.  No chest pain or sob reported.  Breathing stable.  No abdominal apin.  Bowles moving.  Due to f/u with ortho.  Discussed neurology referral.     Past Medical History:  Diagnosis Date   Arthritis    Benign breast lumps    multiple lumps biopsy and remove x's 5 last being 1979 by Dr. Sharlet Salina   COPD (chronic obstructive pulmonary disease) ([place])    MILD-RARELY EVER HAS TO USE COMBIVENT INHALER   Fibroids    s/p hysterectomy   GERD (gastroesophageal reflux disease)    Hypercholesterolemia    Osteoporosis    Personal history of tobacco use, presenting hazards to health 08/07/2015   Urinary tract infection    Past Surgical History:  Procedure Laterality Date   ABDOMINAL HYSTERECTOMY  1997   with bilateral oophorectomy   BREAST  BIOPSY Left    multiple done   BREAST BIOPSY Right 2016   stereo- neg. FC changes   BREAST EXCISIONAL BIOPSY Right 1975   multiple biopsies done   BREAST SURGERY Left    lump removed. Unsure of date   BREAST SURGERY Right    lump removed. Unsure of date   COLONOSCOPY  2010   Dr. Vira Agar   COLONOSCOPY WITH PROPOFOL N/A 06/17/2021   Procedure: COLONOSCOPY WITH PROPOFOL;  Surgeon: Lin Landsman, MD;  Location: [facility] ENDOSCOPY;  Service: Gastroenterology;  Laterality: N/A;   ESOPHAGOGASTRODUODENOSCOPY (EGD) WITH PROPOFOL  06/17/2021   Procedure: ESOPHAGOGASTRODUODENOSCOPY (EGD) WITH PROPOFOL;  Surgeon: Lin Landsman, MD;  Location: [facility] ENDOSCOPY;  Service: Gastroenterology;;   KNEE ARTHROSCOPY WITH LATERAL MENISECTOMY Right 05/08/2020   Procedure: RIGHT KNEE ARTHROSCOPY WITH DEBRIDEMENT AND PARTIAL LATERAL MENISCECTOMY;  Surgeon: Corky Mull, MD;  Location: [facility] ORS;  Service: Orthopedics;  Laterality: Right;   LAPAROSCOPIC APPENDECTOMY N/A 12/21/2018   Procedure: APPENDECTOMY LAPAROSCOPIC CONVERTED TO OPEN;  Surgeon: Benjamine Sprague, DO;  Location: [facility] ORS;  Service: General;  Laterality: N/A;   TONSILLECTOMY     Family History  Problem Relation Age of Onset   Hyperlipidemia Mother    Thyroid disease Mother    Hypertension Mother    Urinary tract infection Mother    Colon cancer Father    Stroke Father    Heart disease Father        myocardial  infarction   Breast cancer Neg Hx    Social History   Socioeconomic History   Marital status: Married    Spouse name: Not on file   Number of children: 2   Years of education: Not on file   Highest education level: Not on file  Occupational History   Not on file  Tobacco Use   Smoking status: Former    Packs/day: 1.00    Years: 30.00    Pack years: 30.00    Types: Cigarettes    Quit date: 04/26/2004    Years since quitting: 17.3   Smokeless tobacco: Never  Vaping Use   Vaping Use: Never used  Substance and Sexual  Activity   Alcohol use: Yes    Alcohol/week: 0.0 standard drinks    Comment: occassional wine every other day   Drug use: No   Sexual activity: Yes    Birth control/protection: Post-menopausal  Other Topics Concern   Not on file  Social History Narrative   She is married, has two children. Works at [employer] Strain: Not on file  Food Insecurity: Not on file  Transportation Needs: Not on file  Physical Activity: Not on file  Stress: Not on file  Social Connections: Not on file     Review of Systems  Constitutional:  Negative for appetite change and unexpected weight change.  HENT:  Negative for congestion and sinus pressure.   Respiratory:  Negative for cough, chest tightness and shortness of breath.   Cardiovascular:  Negative for chest pain, palpitations and leg swelling.  Gastrointestinal:  Negative for abdominal pain, diarrhea, nausea and vomiting.  Genitourinary:  Negative for difficulty urinating and dysuria.  Musculoskeletal:  Negative for joint swelling and myalgias.  Skin:  Negative for color change and rash.  Neurological:  Negative for dizziness, light-headedness and headaches.  Psychiatric/Behavioral:  Negative for agitation and dysphoric mood.       Objective:     BP 128/70    Pulse 82    Temp 97.9 F (36.6 C)    Resp 16    Wt 196 lb 12.8 oz (89.3 kg)    LMP 07/20/1995    SpO2 99%    BMI 34.86 kg/m  Wt Readings from Last 3 Encounters:  08/07/21 196 lb 12.8 oz (89.3 kg)  08/01/21 188 lb (85.3 kg)  07/23/21 192 lb (87.1 kg)    Physical Exam Vitals reviewed.  Constitutional:      General: She is not in acute distress.    Appearance: Normal appearance.  HENT:     Head: Normocephalic and atraumatic.     Right Ear: External ear normal.     Left Ear: External ear normal.  Eyes:     General: No scleral icterus.       Right eye: No discharge.        Left eye: No discharge.      Conjunctiva/sclera: Conjunctivae normal.  Neck:     Thyroid: No thyromegaly.  Cardiovascular:     Rate and Rhythm: Normal rate and regular rhythm.  Pulmonary:     Effort: No respiratory distress.     Breath sounds: Normal breath sounds. No wheezing.  Abdominal:     General: Bowel sounds are normal.     Palpations: Abdomen is soft.     Tenderness: There is no abdominal tenderness.  Musculoskeletal:        General: No swelling  or tenderness.     Cervical back: Neck supple. No tenderness.  Lymphadenopathy:     Cervical: No cervical adenopathy.  Skin:    Findings: No erythema or rash.  Neurological:     Mental Status: She is alert.  Psychiatric:        Mood and Affect: Mood normal.        Behavior: Behavior normal.     Outpatient Encounter Medications as of 08/07/2021  Medication Sig   acetaminophen (TYLENOL) 500 MG tablet Take 1,000 mg by mouth every 6 (six) hours as needed for moderate pain.   aspirin EC 81 MG EC tablet Take 1 tablet (81 mg total) by mouth daily. Swallow whole.   Calcium Carb-Cholecalciferol (CALCIUM 500 +D) 500-400 MG-UNIT TABS Take 1 tablet by mouth daily.   Cholecalciferol (VITAMIN D) 2000 UNITS tablet Take 2,000 Units by mouth daily.   clopidogrel (PLAVIX) 75 MG tablet Take 1 tablet (75 mg total) by mouth daily for 21 days.   Cranberry 450 MG CAPS Take 450 mg by mouth daily.   denosumab (PROLIA) 60 MG/ML SOSY injection Inject 60 mg into the skin every 6 (six) months.    fluticasone-salmeterol (ADVAIR HFA) 230-21 MCG/ACT inhaler Inhale 2 puffs into the lungs 2 (two) times daily.   gabapentin (NEURONTIN) 300 MG capsule Take 300 mg by mouth 3 (three) times daily.   Ipratropium-Albuterol (COMBIVENT RESPIMAT) 20-100 MCG/ACT AERS respimat Inhale 1 puff into the lungs every 6 (six) hours as needed for wheezing.   omeprazole (PRILOSEC) 20 MG capsule Take 1 capsule (20 mg total) by mouth daily.   ondansetron (ZOFRAN) 4 MG tablet Take 8 mg by mouth every 8 (eight)  hours as needed for nausea or vomiting.   oxyCODONE (OXY IR/ROXICODONE) 5 MG immediate release tablet Take 5 mg by mouth every 6 (six) hours as needed for moderate pain.   pravastatin (PRAVACHOL) 40 MG tablet Take 1 tablet (40 mg total) by mouth daily.   traMADol (ULTRAM) 50 MG tablet Take 50-100 mg by mouth every 6 (six) hours as needed for moderate pain.   [DISCONTINUED] methocarbamol (ROBAXIN) 500 MG tablet Take 500 mg by mouth every 8 (eight) hours as needed for muscle spasms.   [DISCONTINUED] Probiotic Product (PROBIOTIC DAILY PO) Take 1 capsule by mouth daily.   No facility-administered encounter medications on file as of 08/07/2021.     Lab Results  Component Value Date   WBC 5.5 08/03/2021   HGB 8.7 (L) 08/03/2021   HCT 27.1 (L) 08/03/2021   PLT 259 08/03/2021   GLUCOSE 106 (H) 08/03/2021   CHOL 125 08/02/2021   TRIG 61 08/02/2021   HDL 47 08/02/2021   LDLDIRECT 153.0 07/17/2015   LDLCALC 66 08/02/2021   ALT 14 07/23/2021   AST 14 07/23/2021   NA 136 08/03/2021   K 3.6 08/03/2021   CL 98 08/03/2021   CREATININE 0.53 08/03/2021   BUN 8 08/03/2021   CO2 27 08/03/2021   TSH 1.571 08/02/2021   INR 0.9 07/23/2021   HGBA1C 5.5 08/02/2021    CT ANGIO HEAD NECK W WO CM  Result Date: 08/02/2021 CLINICAL DATA:  Stroke suspected, follow-up EXAM: CT ANGIOGRAPHY HEAD AND NECK TECHNIQUE: Multidetector CT imaging of the head and neck was performed using the standard protocol during bolus administration of intravenous contrast. Multiplanar CT image reconstructions and MIPs were obtained to evaluate the vascular anatomy. Carotid stenosis measurements (when applicable) are obtained utilizing NASCET criteria, using the distal internal carotid diameter as the  denominator. RADIATION DOSE REDUCTION: This exam was performed according to the departmental dose-optimization program which includes automated exposure control, adjustment of the mA and/or kV according to patient size and/or use of  iterative reconstruction technique. CONTRAST:  63mL OMNIPAQUE IOHEXOL 350 MG/ML SOLN COMPARISON:  No prior CTA, correlation is made with CT head 08/01/2021, MRI head 08/01/2021, and CT neck 05/28/2020 FINDINGS: CT HEAD FINDINGS Brain: Mild focal hypodensity in the anterior left corpus callosum, which correlates with the restricted diffusion seen on the 08/01/2021 MRI. No other new hypodensity to suggest additional acute infarct. No acute hemorrhage, mass, mass effect, or midline shift. No hydrocephalus or extra-axial collection. Vascular: Please see CTA findings below Skull: Normal. Negative for fracture or focal lesion. Sinuses: Imaged portions are clear. Orbits: No acute finding. Review of the MIP images confirms the above findings CTA NECK FINDINGS Aortic arch: Standard branching. Imaged portion shows no evidence of aneurysm or dissection. No significant stenosis of the major arch vessel origins. Right carotid system: No evidence of dissection, stenosis (50% or greater) or occlusion. Retropharyngeal course of the ICA. Left carotid system: No evidence of dissection, stenosis (50% or greater) or occlusion. Vertebral arteries: No evidence of dissection, stenosis (50% or greater) or occlusion. Skeleton: No acute osseous abnormality. Other neck: 2.0 cm hypoenhancing nodule in the posterior right thyroid lobe, which correlates with the lesion evaluated on the 06/11/2020 ultrasound and appears similar to the 05/28/2020 CT neck. Upper chest: Small right pleural effusion with associated atelectasis. Centrilobular emphysema. No left pleural effusion. Review of the MIP images confirms the above findings CTA HEAD FINDINGS Anterior circulation: Both internal carotid arteries are patent to the termini, with mild calcifications but without significant stenosis. A1 segments patent. Normal anterior communicating artery. Anterior cerebral arteries are patent to their distal aspects. No M1 stenosis or occlusion. Normal MCA  bifurcations. Distal MCA branches perfused and symmetric. Posterior circulation: Vertebral arteries patent to the vertebrobasilar junction without stenosis. Basilar patent to its distal aspect. Superior cerebellar arteries patent bilaterally. Bilateral P1 segments originate from the basilar artery. Bilateral posterior communicating arteries are patent. PCAs perfused to their distal aspects without stenosis. Venous sinuses: As permitted by contrast timing, patent. Anatomic variants: None significant Review of the MIP images confirms the above findings IMPRESSION: 1. Hypodensity in the anterior left corpus callosum correlates with the restricted diffusion seen on the 08/01/2021 MRI. No other acute intracranial process. 2.  No intracranial large vessel occlusion or significant stenosis. 3.  No hemodynamically significant stenosis in the neck. 4. Small right pleural effusion. 5. 1.9 cm nodule in the posterior right thyroid lobe, which correlates with a lesion last imaged on a 06/11/2020 ultrasound of the thyroid and appears unchanged compared to the 05/28/2020 CT neck. Further follow-up with ultrasound could be obtained if clinically indicated. 6.  Emphysema (ICD10-J43.9). Electronically Signed   By: Merilyn Baba M.D.   On: 08/02/2021 16:56   US Carotid Bilateral  Result Date: 08/02/2021 CLINICAL DATA:  Acute CVA EXAM: BILATERAL CAROTID DUPLEX ULTRASOUND TECHNIQUE: Pearline Cables scale imaging, color Doppler and duplex ultrasound were performed of bilateral carotid and vertebral arteries in the neck. COMPARISON:  None. FINDINGS: Criteria: Quantification of carotid stenosis is based on velocity parameters that correlate the residual internal carotid diameter with NASCET-based stenosis levels, using the diameter of the distal internal carotid lumen as the denominator for stenosis measurement. The following velocity measurements were obtained: RIGHT ICA: 134/35 cm/sec CCA: 680/32 cm/sec SYSTOLIC ICA/CCA RATIO:  1.3 ECA: 114  cm/sec LEFT ICA: 120/39  cm/sec CCA: 32/44 cm/sec SYSTOLIC ICA/CCA RATIO:  1.3 ECA: 99 cm/sec RIGHT CAROTID ARTERY: Mildly elevated peak systolic velocity is likely artifact given only minimal atheromatous plaque of the carotid bifurcation. RIGHT VERTEBRAL ARTERY:  Antegrade flow. LEFT CAROTID ARTERY: Minimal atheromatous plaque of the carotid bifurcation. LEFT VERTEBRAL ARTERY:  Antegrade flow. IMPRESSION: Less than 50% stenosis of the internal carotid arteries. Electronically Signed   By: Miachel Roux M.D.   On: 08/02/2021 11:00   US Venous Img Lower Bilateral (DVT)  Result Date: 08/02/2021 CLINICAL DATA:  Lower extremity swelling and pain Recent knee surgery EXAM: BILATERAL LOWER EXTREMITY VENOUS DOPPLER ULTRASOUND TECHNIQUE: Gray-scale sonography with compression, as well as color and duplex ultrasound, were performed to evaluate the deep venous system(s) from the level of the common femoral vein through the popliteal and proximal calf veins. COMPARISON:  None. FINDINGS: VENOUS Normal compressibility of the common femoral, superficial femoral, and popliteal veins, as well as the visualized calf veins. Visualized portions of profunda femoral vein and great saphenous vein unremarkable. No filling defects to suggest DVT on grayscale or color Doppler imaging. Doppler waveforms show normal direction of venous flow, normal respiratory plasticity and response to augmentation. OTHER None. Limitations: none IMPRESSION: No lower extremity DVT. Electronically Signed   By: Miachel Roux M.D.   On: 08/02/2021 10:44   ECHOCARDIOGRAM COMPLETE  Result Date: 08/02/2021    ECHOCARDIOGRAM REPORT   Patient Name:   GWENDALYN MCGONAGLE Date of Exam: 08/02/2021 Medical Rec #:  [id number]        Height:       63.0 in Accession #:    [id number]       Weight:       188.0 lb Date of Birth:  [birth year]        BSA:          1.883 m Patient Age:    60 years         BP:           99/45 mmHg Patient Gender: F                HR:           103  bpm. Exam Location:  [facility] Procedure: 2D Echo, Color Doppler and Cardiac Doppler Indications:     I63.9 Stroke  History:         Patient has no prior history of Echocardiogram examinations.                  COPD; Risk Factors:Current Smoker.  Sonographer:     Sherrie Sport Referring Phys:  [place] Diagnosing Phys: Serafina Royals MD  Sonographer Comments: Suboptimal apical window and suboptimal subcostal window. Image acquisition challenging due to COPD. IMPRESSIONS  1. Left ventricular ejection fraction, by estimation, is 60 to 65%. The left ventricle has normal function. The left ventricle has no regional wall motion abnormalities. Left ventricular diastolic parameters were normal.  2. Right ventricular systolic function is normal. The right ventricular size is normal.  3. The mitral valve is normal in structure. Trivial mitral valve regurgitation.  4. The aortic valve is normal in structure. Aortic valve regurgitation is trivial. FINDINGS  Left Ventricle: Left ventricular ejection fraction, by estimation, is 60 to 65%. The left ventricle has normal function. The left ventricle has no regional wall motion abnormalities. The left ventricular internal cavity size was normal in size. There is  no left ventricular hypertrophy. Left ventricular diastolic parameters were normal.  Right Ventricle: The right ventricular size is normal. No increase in right ventricular wall thickness. Right ventricular systolic function is normal. Left Atrium: Left atrial size was normal in size. Right Atrium: Right atrial size was normal in size. Pericardium: There is no evidence of pericardial effusion. Mitral Valve: The mitral valve is normal in structure. Trivial mitral valve regurgitation. MV peak gradient, 4.2 mmHg. The mean mitral valve gradient is 2.0 mmHg. Tricuspid Valve: The tricuspid valve is normal in structure. Tricuspid valve regurgitation is trivial. Aortic Valve: The aortic valve is normal in structure. Aortic valve  regurgitation is trivial. Aortic valve mean gradient measures 4.0 mmHg. Aortic valve peak gradient measures 8.4 mmHg. Aortic valve area, by VTI measures 3.08 cm. Pulmonic Valve: The pulmonic valve was normal in structure. Pulmonic valve regurgitation is not visualized. Aorta: The aortic root and ascending aorta are structurally normal, with no evidence of dilitation. IAS/Shunts: No atrial level shunt detected by color flow Doppler.  LEFT VENTRICLE PLAX 2D LVIDd:         3.30 cm   Diastology LVIDs:         2.35 cm   LV e' medial:    7.29 cm/s LV PW:         0.90 cm   LV E/e' medial:  13.5 LV IVS:        0.95 cm   LV e' lateral:   10.30 cm/s LVOT diam:     2.00 cm   LV E/e' lateral: 9.6 LV SV:         74 LV SV Index:   39 LVOT Area:     3.14 cm  RIGHT VENTRICLE RV Basal diam:  3.10 cm RV S prime:     15.90 cm/s TAPSE (M-mode): 2.2 cm LEFT ATRIUM             Index        RIGHT ATRIUM           Index LA diam:        3.10 cm 1.65 cm/m   RA Area:     13.20 cm LA Vol (A2C):   47.2 ml 25.06 ml/m  RA Volume:   35.00 ml  18.58 ml/m LA Vol (A4C):   32.3 ml 17.15 ml/m LA Biplane Vol: 43.0 ml 22.83 ml/m  AORTIC VALVE                    PULMONIC VALVE AV Area (Vmax):    2.84 cm     PV Vmax:        0.82 m/s AV Area (Vmean):   2.79 cm     PV Vmean:       57.300 cm/s AV Area (VTI):     3.08 cm     PV VTI:         0.160 m AV Vmax:           145.00 cm/s  PV Peak grad:   2.7 mmHg AV Vmean:          96.500 cm/s  PV Mean grad:   2.0 mmHg AV VTI:            0.241 m      RVOT Peak grad: 4 mmHg AV Peak Grad:      8.4 mmHg AV Mean Grad:      4.0 mmHg LVOT Vmax:         131.00 cm/s LVOT Vmean:        85.700  cm/s LVOT VTI:          0.236 m LVOT/AV VTI ratio: 0.98  AORTA Ao Root diam: 3.10 cm MITRAL VALVE               TRICUSPID VALVE MV Area (PHT): 4.12 cm    TR Peak grad:   18.3 mmHg MV Area VTI:   2.87 cm    TR Vmax:        214.00 cm/s MV Peak grad:  4.2 mmHg MV Mean grad:  2.0 mmHg    SHUNTS MV Vmax:       1.03 m/s    Systemic  VTI:  0.24 m MV Vmean:      74.1 cm/s   Systemic Diam: 2.00 cm MV Decel Time: 184 msec    Pulmonic VTI:  0.156 m MV E velocity: 98.50 cm/s MV A velocity: 67.30 cm/s MV E/A ratio:  1.46 Serafina Royals MD Electronically signed by Serafina Royals MD Signature Date/Time: 08/02/2021/4:06:47 PM    Final        Assessment & Plan:   Problem List Items Addressed This Visit     COPD (chronic obstructive pulmonary disease) ([place])    Follow up with pulmonary.  [place].  Breathing stable.        GERD (gastroesophageal reflux disease)    Upper symptoms controlled on current PPI.        History of CVA (cerebrovascular accident)    Found to have acute corpus callosum infarct.  On plavix and aspirin.  Symptoms improved.  Discussed continued exercise.  Discussed f/u with neurology.        Relevant Orders   Ambulatory referral to Neurology   Hypercholesterolemia    On pravastatin.  Low cholesterol diet and exercise. Follow lipid panel and liver function tests.        Thyroid nodule    Saw endocrinology.  Recommended f/u ultrasound and TSH in 12 months.          Einar Pheasant, MD

## 2021-08-09 ENCOUNTER — Ambulatory Visit
Admission: RE | Admit: 2021-08-09 | Discharge: 2021-08-09 | Disposition: A | Discharge status: Home / Self Care | Payer: Medicare HMO | Source: Ambulatory Visit | Attending: Internal Medicine | Admitting: Internal Medicine

## 2021-08-09 ENCOUNTER — Other Ambulatory Visit: Payer: Self-pay

## 2021-08-09 VITALS — BP 140/69 | HR 75 | Temp 98.3°F | Resp 18

## 2021-08-09 DIAGNOSIS — N3091 Cystitis, unspecified with hematuria: Secondary | ICD-10-CM | POA: Diagnosis not present

## 2021-08-09 LAB — URINALYSIS, ROUTINE W REFLEX MICROSCOPIC

## 2021-08-09 LAB — URINALYSIS, MICROSCOPIC (REFLEX): WBC, UA: >50 WBC/hpf (ref 0–5)

## 2021-08-09 MED ORDER — NITROFURANTOIN MONOHYD MACRO 100 MG PO CAPS: 100.0000 mg | ORAL_CAPSULE | Freq: Two times a day (BID) | ORAL | 0 refills | Status: DC

## 2021-08-09 NOTE — ED Triage Notes (Signed)
Pt reports burning with urination x 1 day; blood in urine this morning.

## 2021-08-09 NOTE — Discharge Instructions (Signed)
We will call you if the urine culture shows we need to change your antibiotic

## 2021-08-09 NOTE — ED Provider Notes (Addendum)
[facility]    CSN: [id number] Arrival date & time: 08/09/21  1552      History   Chief Complaint Chief Complaint  Patient presents with   Dysuria    HPI Alexis Reed is a 76 y.o. female who presents with onset of dysuria since yesterday. Saw blood in her urine this am. Has a total knee replacement on 01/17 and was cathed. She denies fever, chills, N/V/C or flank pain.   Her last UTI was 1 year ago.     Past Medical History:  Diagnosis Date   Arthritis    Benign breast lumps    multiple lumps biopsy and remove x's 5 last being 1979 by Dr. Sharlet Salina   COPD (chronic obstructive pulmonary disease) ([place])    MILD-RARELY EVER HAS TO USE COMBIVENT INHALER   Fibroids    s/p hysterectomy   GERD (gastroesophageal reflux disease)    Hypercholesterolemia    Osteoporosis    Personal history of tobacco use, presenting hazards to health 08/07/2015   Urinary tract infection     Patient Active Problem List   Diagnosis Date Noted   Edema of right lower leg 08/02/2021   Acute CVA (cerebrovascular accident) ([place]) 08/02/2021   AMS (altered mental status) 08/01/2021   Pre-op evaluation 07/24/2021   Lung nodules 07/24/2021   Dyspepsia    Burping    Polyp of cecum    Polyp of ascending colon    Vitamin D deficiency 05/11/2021   Increased heart rate 05/10/2021   Breast cancer screening 01/07/2021   Ear anomaly 07/18/2020   Complex tear of medial meniscus of right knee as current injury 06/22/2020   Breast nodule 06/10/2020   Thyroid nodule 05/29/2020   Asymmetry of clavicles 05/07/2020   Complex tear of lateral meniscus of right knee as current injury 04/30/2020   Primary osteoarthritis of right knee 04/30/2020   Leg wound, right 01/16/2020   Laceration of skin of knee 01/10/2020   Palpitations 10/10/2019   Breast pain 10/10/2019   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   Abdominal pain 08/22/2018   History of colonic polyps 02/04/2016   Personal history of tobacco  use, presenting hazards to health 08/07/2015   Health care maintenance 11/18/2014   Breast calcifications on mammogram 10/18/2014   Obesity 07/23/2014   COPD (chronic obstructive pulmonary disease) ([place]) 04/26/2014   Tobacco use disorder 04/26/2014   Abnormal mammogram 04/12/2014   UTI (urinary tract infection) 04/02/2014   SOB (shortness of breath) 02/25/2014   Stress 02/25/2014   Internal hemorrhoids without complication 62/22/9798   Osteoporosis 06/18/2012   GERD (gastroesophageal reflux disease) 06/18/2012   Hypercholesterolemia 06/18/2012    Past Surgical History:  Procedure Laterality Date   ABDOMINAL HYSTERECTOMY  1997   with bilateral oophorectomy   BREAST BIOPSY Left    multiple done   BREAST BIOPSY Right 2016   stereo- neg. FC changes   BREAST EXCISIONAL BIOPSY Right 1975   multiple biopsies done   BREAST SURGERY Left    lump removed. Unsure of date   BREAST SURGERY Right    lump removed. Unsure of date   COLONOSCOPY  2010   Dr. Vira Agar   COLONOSCOPY WITH PROPOFOL N/A 06/17/2021   Procedure: COLONOSCOPY WITH PROPOFOL;  Surgeon: Lin Landsman, MD;  Location: [facility] ENDOSCOPY;  Service: Gastroenterology;  Laterality: N/A;   ESOPHAGOGASTRODUODENOSCOPY (EGD) WITH PROPOFOL  06/17/2021   Procedure: ESOPHAGOGASTRODUODENOSCOPY (EGD) WITH PROPOFOL;  Surgeon: Lin Landsman, MD;  Location: [place];  Service:  Gastroenterology;;   KNEE ARTHROSCOPY WITH LATERAL MENISECTOMY Right 05/08/2020   Procedure: RIGHT KNEE ARTHROSCOPY WITH DEBRIDEMENT AND PARTIAL LATERAL MENISCECTOMY;  Surgeon: Corky Mull, MD;  Location: [facility] ORS;  Service: Orthopedics;  Laterality: Right;   LAPAROSCOPIC APPENDECTOMY N/A 12/21/2018   Procedure: APPENDECTOMY LAPAROSCOPIC CONVERTED TO OPEN;  Surgeon: Benjamine Sprague, DO;  Location: [facility] ORS;  Service: General;  Laterality: N/A;   TONSILLECTOMY      OB History     Gravida  2   Para  2   Term      Preterm      AB      Living  2       SAB      IAB      Ectopic      Multiple      Live Births           Obstetric Comments  1st Menstrual Cycle:  12 1st Pregnancy: 30          Home Medications    Prior to Admission medications   Medication Sig Start Date End Date Taking? Authorizing Provider  nitrofurantoin, macrocrystal-monohydrate, (MACROBID) 100 MG capsule Take 1 capsule (100 mg total) by mouth 2 (two) times daily. 08/09/21  Yes Rodriguez-Southworth, Sunday Spillers, PA-C  acetaminophen (TYLENOL) 500 MG tablet Take 1,000 mg by mouth every 6 (six) hours as needed for moderate pain.    [provider]  aspirin EC 81 MG EC tablet Take 1 tablet (81 mg total) by mouth daily. Swallow whole. 08/04/21   Enzo Bi, MD  Calcium Carb-Cholecalciferol (CALCIUM 500 +D) 500-400 MG-UNIT TABS Take 1 tablet by mouth daily.    [provider]  Cholecalciferol (VITAMIN D) 2000 UNITS tablet Take 2,000 Units by mouth daily.    [provider]  clopidogrel (PLAVIX) 75 MG tablet Take 1 tablet (75 mg total) by mouth daily for 21 days. 08/04/21 08/25/21  Enzo Bi, MD  Cranberry 450 MG CAPS Take 450 mg by mouth daily.    [provider]  denosumab (PROLIA) 60 MG/ML SOSY injection Inject 60 mg into the skin every 6 (six) months.     [provider]  fluticasone-salmeterol (ADVAIR HFA) 230-21 MCG/ACT inhaler Inhale 2 puffs into the lungs 2 (two) times daily. 11/14/20   Flora Lipps, MD  gabapentin (NEURONTIN) 300 MG capsule Take 300 mg by mouth 3 (three) times daily. 07/26/21   [provider]  Ipratropium-Albuterol (COMBIVENT RESPIMAT) 20-100 MCG/ACT AERS respimat Inhale 1 puff into the lungs every 6 (six) hours as needed for wheezing. 11/14/20   Flora Lipps, MD  omeprazole (PRILOSEC) 20 MG capsule Take 1 capsule (20 mg total) by mouth daily. 08/30/20   Einar Pheasant, MD  ondansetron (ZOFRAN) 4 MG tablet Take 8 mg by mouth every 8 (eight) hours as needed for nausea or vomiting. 07/26/21   [provider]  oxyCODONE (OXY IR/ROXICODONE) 5 MG immediate release tablet Take 5 mg by mouth every 6 (six) hours as needed for moderate pain. 07/26/21   [provider]  pravastatin (PRAVACHOL) 40 MG tablet Take 1 tablet (40 mg total) by mouth daily. 05/20/21   Einar Pheasant, MD  traMADol (ULTRAM) 50 MG tablet Take 50-100 mg by mouth every 6 (six) hours as needed for moderate pain. 07/26/21   [provider]    Family History Family History  Problem Relation Age of Onset   Hyperlipidemia Mother    Thyroid disease Mother    Hypertension Mother  Urinary tract infection Mother    Colon cancer Father    Stroke Father    Heart disease Father        myocardial infarction   Breast cancer Neg Hx     Social History Social History   Tobacco Use   Smoking status: Former    Packs/day: 1.00    Years: 30.00    Pack years: 30.00    Types: Cigarettes    Quit date: 04/26/2004    Years since quitting: 17.2   Smokeless tobacco: Never  Vaping Use   Vaping Use: Never used  Substance Use Topics   Alcohol use: Yes    Alcohol/week: 0.0 standard drinks    Comment: occassional wine every other day   Drug use: No     Allergies   Benzoin compound, Boniva [ibandronic acid], Codeine, Fosamax [alendronate sodium], Lipitor [atorvastatin], Meloxicam, and Penicillins   Review of Systems Review of Systems  Constitutional:  Negative for chills, diaphoresis and fever.  Eyes:  Negative for discharge.  Gastrointestinal:  Negative for abdominal pain, constipation, nausea and vomiting.  Genitourinary:  Positive for dysuria, frequency and hematuria. Negative for flank pain.  Musculoskeletal:  Negative for back pain.    Physical Exam Triage Vital Signs ED Triage Vitals [08/09/21 1559]  Enc Vitals Group     BP 140/69     Pulse Rate 75     Resp 18     Temp 98.3 F (36.8 C)     Temp Source Oral     SpO2 97 %     Weight      Height      Head Circumference      Peak Flow       Pain Score      Pain Loc      Pain Edu?      Excl. in [place]?    No data found.  Updated Vital Signs BP 140/69 (BP Location: Right Arm)    Pulse 75    Temp 98.3 F (36.8 C) (Oral)    Resp 18    LMP 07/20/1995    SpO2 97%   Visual Acuity Right Eye Distance:   Left Eye Distance:   Bilateral Distance:    Right Eye Near:   Left Eye Near:    Bilateral Near:     Physical Exam Physical Exam Vitals and nursing note reviewed.  Constitutional:      General: She is not in acute distress.    Appearance: She is not toxic-appearing.  HENT:     Head: Normocephalic.     Right Ear: External ear normal.     Left Ear: External ear normal.  Eyes:     General: No scleral icterus.    Conjunctiva/sclera: Conjunctivae normal.  Pulmonary:     Effort: Pulmonary effort is normal.  Abdominal:     General: Bowel sounds are normal.     Palpations: Abdomen is soft. There is no mass.     Tenderness: There is no guarding or rebound.     Comments: - CVA tenderness   Musculoskeletal:        General: Normal range of motion.     Cervical back: Neck supple.      Skin:    General: Skin is warm and dry.     Findings: No rash.  Neurological:     Mental Status: She is alert and oriented to person, place, and time.     Gait: Gait normal.  Psychiatric:  Mood and Affect: Mood normal.        Behavior: Behavior normal.        Thought Content: Thought content normal.        Judgment: Judgment normal.    UC Treatments / Results  Labs (all labs ordered are listed, but only abnormal results are displayed) Labs Reviewed  URINALYSIS, ROUTINE W REFLEX MICROSCOPIC - Abnormal; Notable for the following components:      Result Value   Color, Urine ORANGE (*)    APPearance HAZY (*)    Glucose, UA   (*)    Value: TEST NOT REPORTED DUE TO COLOR INTERFERENCE OF URINE PIGMENT   Hgb urine dipstick   (*)    Value: TEST NOT REPORTED DUE TO COLOR INTERFERENCE OF URINE PIGMENT   Bilirubin Urine   (*)    Value:  TEST NOT REPORTED DUE TO COLOR INTERFERENCE OF URINE PIGMENT   Ketones, ur   (*)    Value: TEST NOT REPORTED DUE TO COLOR INTERFERENCE OF URINE PIGMENT   Protein, ur   (*)    Value: TEST NOT REPORTED DUE TO COLOR INTERFERENCE OF URINE PIGMENT   Nitrite   (*)    Value: TEST NOT REPORTED DUE TO COLOR INTERFERENCE OF URINE PIGMENT   Leukocytes,Ua   (*)    Value: TEST NOT REPORTED DUE TO COLOR INTERFERENCE OF URINE PIGMENT   All other components within normal limits  URINALYSIS, MICROSCOPIC (REFLEX) - Abnormal; Notable for the following components:   Bacteria, UA MANY (*)    All other components within normal limits  URINE CULTURE    EKG   Radiology No results found.  Procedures Procedures (including critical care time)  Medications Ordered in UC Medications - No data to display  Initial Impression / Assessment and Plan / UC Course  I have reviewed the triage vital signs and the nursing notes. Pertinent labs  results that were available during my care of the patient were reviewed by me and considered in my medical decision making (see chart for details). UTI I sent her urine for a culture I placed her on Macrobid as noted. We will call her if the urine culture shows resistance to this antibiotic and change it.     Final Clinical Impressions(s) / UC Diagnoses   Final diagnoses:  Hemorrhagic cystitis     Discharge Instructions      We will call you if the urine culture shows we need to change your antibiotic      ED Prescriptions     Medication Sig Dispense Auth. Provider   nitrofurantoin, macrocrystal-monohydrate, (MACROBID) 100 MG capsule Take 1 capsule (100 mg total) by mouth 2 (two) times daily. 7 capsule Rodriguez-Southworth, Sunday Spillers, PA-C      PDMP not reviewed this encounter.   Shelby Mattocks, PA-C 08/09/21 1635    Rodriguez-Southworth, [place], PA-C 08/09/21 1636

## 2021-08-12 LAB — URINE CULTURE: Culture: >=100000 — AB

## 2021-08-17 ENCOUNTER — Other Ambulatory Visit: Payer: Self-pay | Admitting: Internal Medicine

## 2021-08-18 ENCOUNTER — Encounter: Payer: Self-pay | Admitting: Internal Medicine

## 2021-08-18 DIAGNOSIS — Z8673 Personal history of transient ischemic attack (TIA), and cerebral infarction without residual deficits: Secondary | ICD-10-CM | POA: Insufficient documentation

## 2021-08-18 NOTE — Assessment & Plan Note (Signed)
Upper symptoms controlled on current PPI.   

## 2021-08-18 NOTE — Assessment & Plan Note (Signed)
Follow up with pulmonary.  Advair.  Breathing stable.

## 2021-08-18 NOTE — Assessment & Plan Note (Addendum)
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  On plavix and aspirin.  Symptoms improved.  Discussed continued exercise.  Discussed f/u with neurology.

## 2021-08-18 NOTE — Assessment & Plan Note (Signed)
Saw endocrinology.  Recommended f/u ultrasound and TSH in 12 months.   

## 2021-08-18 NOTE — Assessment & Plan Note (Signed)
On pravastatin.  Low cholesterol diet and exercise.  Follow lipid panel and liver function tests.   

## 2021-08-22 DIAGNOSIS — Z7982 Long term (current) use of aspirin: Secondary | ICD-10-CM

## 2021-08-22 DIAGNOSIS — J449 Chronic obstructive pulmonary disease, unspecified: Secondary | ICD-10-CM

## 2021-08-22 DIAGNOSIS — E785 Hyperlipidemia, unspecified: Secondary | ICD-10-CM

## 2021-08-22 DIAGNOSIS — R600 Localized edema: Secondary | ICD-10-CM

## 2021-08-22 DIAGNOSIS — M810 Age-related osteoporosis without current pathological fracture: Secondary | ICD-10-CM

## 2021-08-22 DIAGNOSIS — Z7902 Long term (current) use of antithrombotics/antiplatelets: Secondary | ICD-10-CM

## 2021-08-22 DIAGNOSIS — I69328 Other speech and language deficits following cerebral infarction: Secondary | ICD-10-CM

## 2021-08-22 DIAGNOSIS — Z4733 Aftercare following explantation of knee joint prosthesis: Secondary | ICD-10-CM

## 2021-08-22 DIAGNOSIS — Z96651 Presence of right artificial knee joint: Secondary | ICD-10-CM

## 2021-08-22 DIAGNOSIS — K219 Gastro-esophageal reflux disease without esophagitis: Secondary | ICD-10-CM

## 2021-09-10 ENCOUNTER — Ambulatory Visit: Payer: Medicare HMO | Admitting: Primary Care

## 2021-09-12 ENCOUNTER — Telehealth: Payer: Self-pay | Admitting: Primary Care

## 2021-09-12 ENCOUNTER — Telehealth (INDEPENDENT_AMBULATORY_CARE_PROVIDER_SITE_OTHER): Payer: Medicare HMO | Admitting: Primary Care

## 2021-09-12 ENCOUNTER — Other Ambulatory Visit (HOSPITAL_COMMUNITY): Payer: Self-pay

## 2021-09-12 DIAGNOSIS — R918 Other nonspecific abnormal finding of lung field: Secondary | ICD-10-CM

## 2021-09-12 NOTE — Progress Notes (Signed)
Virtual Visit via Video Note ? ?I connected with Alexis Reed on 09/12/21 at  2:30 PM EST by a video enabled telemedicine application and verified that I am speaking with the correct person using two identifiers. ? ?Location: ?Patient: Home ?Provider: Office ?  ?I discussed the limitations of evaluation and management by telemedicine and the availability of in person appointments. The patient expressed understanding and agreed to proceed. ? ?History of Present Illness: ?76 year old female, former smoker quit in 2005 (30 pack year hx). PMH significant for COPD, bronchiectasis, CVA, GERD, osteoporosis. Patient of Dr. Mortimer Fries.  ? ?09/12/2021 ?Patient contacted today for overdur follow-up. She was last seen in office in May 2022 by Dr. Mortimer Fries. She was started on Advair at this time for COPD. She had a CT chest on 12/03/20 which showed stable 1-49mm pulmonary nodules throughout the lungs bilaterally, considered benign. There were several new nodules, largest of which is mized solid and sub solid lesion in RUL measuring Chronic areas of scarring in interior segment of the lingula.  ? ?She is doing well today. She has no acute respiratory symptoms. She does not experience shortness of breath with day to day activities, only with inclines/stairs. No wheezing since starting Advair. She only uses Advair once daily to extend the length of her inhaler to ensure she does not go into the donut hole. Despite using once daily her symptoms are well controlled. She has no cough or phlegm congestion.  ? ?  ?Observations/Objective: ? ?- Appears well; No overt shortness of breath, wheezing or cough ? ?Assessment and Plan: ? ?COPD, mixed type: ?- Stable; PFTs in July 2022 showed moderate-severe obstruction with positive BD response/ FEV1 60%, ratio 55. She has moderate emphysema on imaging. She is minimally symptomatic. No coughing. Wheezing improved on Advair 230-12mcg. We will check with pharmacy to see what ICS/LABA is formulary on her  plan. Memory Dance may be a better option for her if looking for once daily use.  ? ?Pulmonary nodule: ?- CT chest in May 2022 showed stable 1-17mm pulmonary nodules throughout the lungs bilaterally, considered benign. She has several new nodules, largest measuring 1.6 x1.1cm RUL. Needs repeat imaging to monitor. If solid component remains unchanged and less than <6mm, annual CT is recommended until 5 years stability.  ? ?Follow Up Instructions: ? ? - 6 months with Dr. Mortimer Fries or Eustaquio Maize NP ? ?I discussed the assessment and treatment plan with the patient. The patient was provided an opportunity to ask questions and all were answered. The patient agreed with the plan and demonstrated an understanding of the instructions. ?  ?The patient was advised to call back or seek an in-person evaluation if the symptoms worsen or if the condition fails to improve as anticipated. ? ?I provided 22 minutes of non-face-to-face time during this encounter. ? ? ?Martyn Ehrich, NP ? ?

## 2021-09-12 NOTE — Telephone Encounter (Signed)
What ICS/LABA is formulary on patient's insurance plan?  ?

## 2021-09-12 NOTE — Telephone Encounter (Signed)
I called pt pharmacy and pt recently filled Advair HFA on 2.23.23 ($30). I am not able to test bill other ICS/LABA and see the copay [place], was able to see that Advair HFA and Breo are preferred.

## 2021-09-12 NOTE — Patient Instructions (Addendum)
Recommendations: ?Continue Advair 1-2 puffs twice daily  ? ?Orders: ?CT chest wo contrast re: pulmonary nodule  ? ?Follow-up: ?6 months with Dr. Mortimer Fries ?

## 2021-09-13 NOTE — Telephone Encounter (Signed)
Please let patient know Advair and BREO are preferred on her plan. Memory Dance would be once a day but would only last a month. If her symptoms are ok on Advair have her do 1 puff morning and 1 puff evening to extend the life of her inhaler.

## 2021-09-13 NOTE — Telephone Encounter (Signed)
Called and spoke with pt letting her know the info per BW and she verbalized understanding. Nothing further needed. 

## 2021-09-15 ENCOUNTER — Encounter: Payer: Self-pay | Admitting: Internal Medicine

## 2021-09-15 DIAGNOSIS — Z96659 Presence of unspecified artificial knee joint: Secondary | ICD-10-CM | POA: Insufficient documentation

## 2021-09-15 DIAGNOSIS — E538 Deficiency of other specified B group vitamins: Secondary | ICD-10-CM | POA: Insufficient documentation

## 2021-09-17 ENCOUNTER — Other Ambulatory Visit: Payer: Self-pay

## 2021-09-17 ENCOUNTER — Ambulatory Visit (INDEPENDENT_AMBULATORY_CARE_PROVIDER_SITE_OTHER): Payer: Medicare HMO | Admitting: Internal Medicine

## 2021-09-17 ENCOUNTER — Encounter: Payer: Self-pay | Admitting: Internal Medicine

## 2021-09-17 DIAGNOSIS — J449 Chronic obstructive pulmonary disease, unspecified: Secondary | ICD-10-CM

## 2021-09-17 DIAGNOSIS — E538 Deficiency of other specified B group vitamins: Secondary | ICD-10-CM

## 2021-09-17 DIAGNOSIS — E041 Nontoxic single thyroid nodule: Secondary | ICD-10-CM

## 2021-09-17 DIAGNOSIS — M810 Age-related osteoporosis without current pathological fracture: Secondary | ICD-10-CM | POA: Diagnosis not present

## 2021-09-17 DIAGNOSIS — E7800 Pure hypercholesterolemia, unspecified: Secondary | ICD-10-CM

## 2021-09-17 DIAGNOSIS — F439 Reaction to severe stress, unspecified: Secondary | ICD-10-CM

## 2021-09-17 DIAGNOSIS — K219 Gastro-esophageal reflux disease without esophagitis: Secondary | ICD-10-CM | POA: Diagnosis not present

## 2021-09-17 DIAGNOSIS — Z86010 Personal history of colonic polyps: Secondary | ICD-10-CM

## 2021-09-17 DIAGNOSIS — Z8673 Personal history of transient ischemic attack (TIA), and cerebral infarction without residual deficits: Secondary | ICD-10-CM

## 2021-09-17 DIAGNOSIS — R918 Other nonspecific abnormal finding of lung field: Secondary | ICD-10-CM

## 2021-09-17 DIAGNOSIS — Z96651 Presence of right artificial knee joint: Secondary | ICD-10-CM

## 2021-09-17 NOTE — Progress Notes (Unsigned)
Patient ID: WINDA Reed, female   DOB: 02/15/1946, 76 y.o.   MRN: [id number]   Subjective:    Patient ID: Alexis Reed, female    DOB: [birth year], 76 y.o.   MRN: [id number]  This visit occurred during the SARS-CoV-2 public health emergency.  Safety protocols were in place, including screening questions prior to the visit, additional usage of staff PPE, and extensive cleaning of exam room while observing appropriate contact time as indicated for disinfecting solutions.   Patient here for No chief complaint on file.  Marland Kitchen   HPI    Past Medical History:  Diagnosis Date   Arthritis    Benign breast lumps    multiple lumps biopsy and remove x's 5 last being 1979 by Dr. Sharlet Salina   COPD (chronic obstructive pulmonary disease) ([place])    MILD-RARELY EVER HAS TO USE COMBIVENT INHALER   Fibroids    s/p hysterectomy   GERD (gastroesophageal reflux disease)    Hypercholesterolemia    Osteoporosis    Personal history of tobacco use, presenting hazards to health 08/07/2015   Urinary tract infection    Past Surgical History:  Procedure Laterality Date   ABDOMINAL HYSTERECTOMY  1997   with bilateral oophorectomy   BREAST BIOPSY Left    multiple done   BREAST BIOPSY Right 2016   stereo- neg. FC changes   BREAST EXCISIONAL BIOPSY Right 1975   multiple biopsies done   BREAST SURGERY Left    lump removed. Unsure of date   BREAST SURGERY Right    lump removed. Unsure of date   COLONOSCOPY  2010   Dr. Vira Agar   COLONOSCOPY WITH PROPOFOL N/A 06/17/2021   Procedure: COLONOSCOPY WITH PROPOFOL;  Surgeon: Lin Landsman, MD;  Location: [facility] ENDOSCOPY;  Service: Gastroenterology;  Laterality: N/A;   ESOPHAGOGASTRODUODENOSCOPY (EGD) WITH PROPOFOL  06/17/2021   Procedure: ESOPHAGOGASTRODUODENOSCOPY (EGD) WITH PROPOFOL;  Surgeon: Lin Landsman, MD;  Location: [facility] ENDOSCOPY;  Service: Gastroenterology;;   KNEE ARTHROSCOPY WITH LATERAL MENISECTOMY Right 05/08/2020   Procedure: RIGHT KNEE  ARTHROSCOPY WITH DEBRIDEMENT AND PARTIAL LATERAL MENISCECTOMY;  Surgeon: Corky Mull, MD;  Location: [facility];  Service: Orthopedics;  Laterality: Right;   LAPAROSCOPIC APPENDECTOMY N/A 12/21/2018   Procedure: APPENDECTOMY LAPAROSCOPIC CONVERTED TO OPEN;  Surgeon: Benjamine Sprague, DO;  Location: [facility];  Service: General;  Laterality: N/A;   TONSILLECTOMY     Family History  Problem Relation Age of Onset   Hyperlipidemia Mother    Thyroid disease Mother    Hypertension Mother    Urinary tract infection Mother    Colon cancer Father    Stroke Father    Heart disease Father        myocardial infarction   Breast cancer Neg Hx    Social History   Socioeconomic History   Marital status: Married    Spouse name: Not on file   Number of children: 2   Years of education: Not on file   Highest education level: Not on file  Occupational History   Not on file  Tobacco Use   Smoking status: Former    Packs/day: 1.00    Years: 30.00    Pack years: 30.00    Types: Cigarettes    Quit date: 04/26/2004    Years since quitting: 17.4   Smokeless tobacco: Never  Vaping Use   Vaping Use: Never used  Substance and Sexual Activity   Alcohol use: Yes    Alcohol/week: 0.0 standard drinks  Comment: occassional wine every other day   Drug use: No   Sexual activity: Yes    Birth control/protection: Post-menopausal  Other Topics Concern   Not on file  Social History Narrative   She is married, has two children. Works at [employer] Strain: Not on file  Food Insecurity: Not on file  Transportation Needs: Not on file  Physical Activity: Not on file  Stress: Not on file  Social Connections: Not on file     Review of Systems     Objective:     BP 120/70    Pulse 71    Temp 97.6 F (36.4 C)    Resp 16    Wt 184 lb (83.5 kg)    LMP 07/20/1995    SpO2 98%    BMI 31.58 kg/m  Wt Readings from Last 3  Encounters:  09/17/21 184 lb (83.5 kg)  08/07/21 196 lb 12.8 oz (89.3 kg)  08/01/21 188 lb (85.3 kg)    Physical Exam   Outpatient Encounter Medications as of 09/17/2021  Medication Sig   acetaminophen (TYLENOL) 500 MG tablet Take 1,000 mg by mouth every 6 (six) hours as needed for moderate pain.   aspirin EC 81 MG EC tablet Take 1 tablet (81 mg total) by mouth daily. Swallow whole.   Calcium Carb-Cholecalciferol (CALCIUM 500 +D) 500-400 MG-UNIT TABS Take 1 tablet by mouth daily.   Cholecalciferol (VITAMIN D) 2000 UNITS tablet Take 2,000 Units by mouth daily.   Cranberry 450 MG CAPS Take 450 mg by mouth daily.   denosumab (PROLIA) 60 MG/ML SOSY injection Inject 60 mg into the skin every 6 (six) months.    fluticasone-salmeterol (ADVAIR HFA) 230-21 MCG/ACT inhaler Inhale 2 puffs into the lungs 2 (two) times daily.   gabapentin (NEURONTIN) 300 MG capsule Take 300 mg by mouth 3 (three) times daily.   ibuprofen (ADVIL) 800 MG tablet Take 800 mg by mouth 3 (three) times daily.   Ipratropium-Albuterol (COMBIVENT RESPIMAT) 20-100 MCG/ACT AERS respimat Inhale 1 puff into the lungs every 6 (six) hours as needed for wheezing.   omeprazole (PRILOSEC) 20 MG capsule TAKE 1 CAPSULE BY MOUTH ONCE DAILY   ondansetron (ZOFRAN) 4 MG tablet Take 8 mg by mouth every 8 (eight) hours as needed for nausea or vomiting.   pravastatin (PRAVACHOL) 40 MG tablet Take 1 tablet (40 mg total) by mouth daily.   [DISCONTINUED] nitrofurantoin, macrocrystal-monohydrate, (MACROBID) 100 MG capsule Take 1 capsule (100 mg total) by mouth 2 (two) times daily.   [DISCONTINUED] oxyCODONE (OXY IR/ROXICODONE) 5 MG immediate release tablet Take 5 mg by mouth every 6 (six) hours as needed for moderate pain.   [DISCONTINUED] traMADol (ULTRAM) 50 MG tablet Take 50-100 mg by mouth every 6 (six) hours as needed for moderate pain.   No facility-administered encounter medications on file as of 09/17/2021.     Lab Results  Component Value  Date   WBC 5.5 08/03/2021   HGB 8.7 (L) 08/03/2021   HCT 27.1 (L) 08/03/2021   PLT 259 08/03/2021   GLUCOSE 106 (H) 08/03/2021   CHOL 125 08/02/2021   TRIG 61 08/02/2021   HDL 47 08/02/2021   LDLDIRECT 153.0 07/17/2015   LDLCALC 66 08/02/2021   ALT 14 07/23/2021   AST 14 07/23/2021   NA 136 08/03/2021   K 3.6 08/03/2021   CL 98 08/03/2021   CREATININE 0.53 08/03/2021   BUN 8  08/03/2021   CO2 27 08/03/2021   TSH 1.571 08/02/2021   INR 0.9 07/23/2021   HGBA1C 5.5 08/02/2021      Assessment & Plan:   Problem List Items Addressed This Visit   None    Einar Pheasant, MD

## 2021-09-26 ENCOUNTER — Ambulatory Visit (INDEPENDENT_AMBULATORY_CARE_PROVIDER_SITE_OTHER): Payer: Medicare HMO | Admitting: Primary Care

## 2021-09-26 DIAGNOSIS — R911 Solitary pulmonary nodule: Secondary | ICD-10-CM

## 2021-09-26 NOTE — Progress Notes (Signed)
PEER-TO PEER FOR CT CHEST which was originally denied by her insurance. This is a 76 year old female, long standing smoker. Progressive shortness of breath. Hx bronchiectasis and tree-in-bud nodularity. CT chest in May 2022 showed stable 1-37m pulmonary nodules throughout the lungs bilaterally, considered benign. She has several new nodules, largest measuring 1.6 x1.1cm RUL. Needs repeat imaging to monitor. If solid component remains unchanged and less than <637m annual CT is recommended until 5 years stability. Guidelines for Management of Incidental Pulmonary Nodules Detected on CT Images: From the Fleischner Society 2017; Radiology 2017; 284:228-243 ?  ?CT has been approved.  ?AJosem Kaufmannode- - [id number]  ?

## 2021-09-27 ENCOUNTER — Other Ambulatory Visit: Payer: Self-pay

## 2021-09-27 ENCOUNTER — Ambulatory Visit: Payer: Medicare HMO

## 2021-09-27 ENCOUNTER — Encounter: Payer: Self-pay | Admitting: Internal Medicine

## 2021-09-27 ENCOUNTER — Ambulatory Visit (INDEPENDENT_AMBULATORY_CARE_PROVIDER_SITE_OTHER): Payer: Medicare HMO | Admitting: Internal Medicine

## 2021-09-27 VITALS — BP 124/86 | HR 86 | Temp 98.2°F | Ht 64.0 in | Wt 181.0 lb

## 2021-09-27 DIAGNOSIS — R399 Unspecified symptoms and signs involving the genitourinary system: Secondary | ICD-10-CM | POA: Diagnosis not present

## 2021-09-27 MED ORDER — CIPROFLOXACIN HCL 500 MG PO TABS: 500.0000 mg | ORAL_TABLET | Freq: Two times a day (BID) | ORAL | 0 refills | Status: AC

## 2021-09-27 NOTE — Patient Instructions (Signed)
?D-mannose + cranberry supplements for prevention  ? ?Urinary Tract Infection, Adult ?A urinary tract infection (UTI) is an infection of any part of the urinary tract. The urinary tract includes the kidneys, ureters, bladder, and urethra. These organs make, store, and get rid of urine in the body. ?An upper UTI affects the ureters and kidneys. A lower UTI affects the bladder and urethra. ?What are the causes? ?Most urinary tract infections are caused by bacteria in your genital area around your urethra, where urine leaves your body. These bacteria grow and cause inflammation of your urinary tract. ?What increases the risk? ?You are more likely to develop this condition if: ?You have a urinary catheter that stays in place. ?You are not able to control when you urinate or have a bowel movement (incontinence). ?You are female and you: ?Use a spermicide or diaphragm for birth control. ?Have low estrogen levels. ?Are pregnant. ?You have certain genes that increase your risk. ?You are sexually active. ?You take antibiotic medicines. ?You have a condition that causes your flow of urine to slow down, such as: ?An enlarged prostate, if you are female. ?Blockage in your urethra. ?A kidney stone. ?A nerve condition that affects your bladder control (neurogenic bladder). ?Not getting enough to drink, or not urinating often. ?You have certain medical conditions, such as: ?Diabetes. ?A weak disease-fighting system (immunesystem). ?Sickle cell disease. ?Gout. ?Spinal cord injury. ?What are the signs or symptoms? ?Symptoms of this condition include: ?Needing to urinate right away (urgency). ?Frequent urination. This may include small amounts of urine each time you urinate. ?Pain or burning with urination. ?Blood in the urine. ?Urine that smells bad or unusual. ?Trouble urinating. ?Cloudy urine. ?Vaginal discharge, if you are female. ?Pain in the abdomen or the lower back. ?You may also have: ?Vomiting or a decreased  appetite. ?Confusion. ?Irritability or tiredness. ?A fever or chills. ?Diarrhea. ?The first symptom in older adults may be confusion. In some cases, they may not have any symptoms until the infection has worsened. ?How is this diagnosed? ?This condition is diagnosed based on your medical history and a physical exam. You may also have other tests, including: ?Urine tests. ?Blood tests. ?Tests for STIs (sexually transmitted infections). ?If you have had more than one UTI, a cystoscopy or imaging studies may be done to determine the cause of the infections. ?How is this treated? ?Treatment for this condition includes: ?Antibiotic medicine. ?Over-the-counter medicines to treat discomfort. ?Drinking enough water to stay hydrated. ?If you have frequent infections or have other conditions such as a kidney stone, you may need to see a health care provider who specializes in the urinary tract (urologist). ?In rare cases, urinary tract infections can cause sepsis. Sepsis is a life-threatening condition that occurs when the body responds to an infection. Sepsis is treated in the hospital with IV antibiotics, fluids, and other medicines. ?Follow these instructions at home: ?Medicines ?Take over-the-counter and prescription medicines only as told by your health care provider. ?If you were prescribed an antibiotic medicine, take it as told by your health care provider. Do not stop using the antibiotic even if you start to feel better. ?General instructions ?Make sure you: ?Empty your bladder often and completely. Do not hold urine for long periods of time. ?Empty your bladder after sex. ?Wipe from front to back after urinating or having a bowel movement if you are female. Use each tissue only one time when you wipe. ?Drink enough fluid to keep your urine pale yellow. ?Keep all follow-up visits.  This is important. ?Contact a health care provider if: ?Your symptoms do not get better after 1-2 days. ?Your symptoms go away and then  return. ?Get help right away if: ?You have severe pain in your back or your lower abdomen. ?You have a fever or chills. ?You have nausea or vomiting. ?Summary ?A urinary tract infection (UTI) is an infection of any part of the urinary tract, which includes the kidneys, ureters, bladder, and urethra. ?Most urinary tract infections are caused by bacteria in your genital area. ?Treatment for this condition often includes antibiotic medicines. ?If you were prescribed an antibiotic medicine, take it as told by your health care provider. Do not stop using the antibiotic even if you start to feel better. ?Keep all follow-up visits. This is important. ?This information is not intended to replace advice given to you by your health care provider. Make sure you discuss any questions you have with your health care provider. ?Document Revised: 02/10/2020 Document Reviewed: 02/10/2020 ?Elsevier Patient Education ? [place]. ? ?

## 2021-09-27 NOTE — Progress Notes (Signed)
Chief Complaint  ?Patient presents with  ? Urinary Tract Infection  ? ?F/u  ?1. Uti onset this am burning and freq no lower ab pain since this am h/o UTI last uti 07/2021 after right knee surgery and she thinks at that time maybe she wasn't able to wipe as well after knee surgery macrobid causes nausea ? ? ?Review of Systems  ?Constitutional:  Negative for weight loss.  ?HENT:  Negative for hearing loss.   ?Eyes:  Negative for blurred vision.  ?Respiratory:  Negative for shortness of breath.   ?Cardiovascular:  Negative for chest pain.  ?Gastrointestinal:  Negative for abdominal pain and blood in stool.  ?Genitourinary:  Positive for dysuria and frequency.  ?Musculoskeletal:  Negative for falls and joint pain.  ?Skin:  Negative for rash.  ?Neurological:  Negative for headaches.  ?Psychiatric/Behavioral:  Negative for depression.   ?Past Medical History:  ?Diagnosis Date  ? Arthritis   ? Benign breast lumps   ? multiple lumps biopsy and remove x's 5 last being 1979 by Dr. Sharlet Salina  ? COPD (chronic obstructive pulmonary disease) ([place])   ? MILD-RARELY EVER HAS TO USE COMBIVENT INHALER  ? Fibroids   ? s/p hysterectomy  ? GERD (gastroesophageal reflux disease)   ? Hypercholesterolemia   ? Osteoporosis   ? Personal history of tobacco use, presenting hazards to health 08/07/2015  ? Urinary tract infection   ? ?Past Surgical History:  ?Procedure Laterality Date  ? ABDOMINAL HYSTERECTOMY  1997  ? with bilateral oophorectomy  ? BREAST BIOPSY Left   ? multiple done  ? BREAST BIOPSY Right 2016  ? stereo- neg. FC changes  ? BREAST EXCISIONAL BIOPSY Right 1975  ? multiple biopsies done  ? BREAST SURGERY Left   ? lump removed. Unsure of date  ? BREAST SURGERY Right   ? lump removed. Unsure of date  ? COLONOSCOPY  2010  ? Dr. Vira Agar  ? COLONOSCOPY WITH PROPOFOL N/A 06/17/2021  ? Procedure: COLONOSCOPY WITH PROPOFOL;  Surgeon: Lin Landsman, MD;  Location[facility] ENDOSCOPY;  Service: Gastroenterology;  Laterality: N/A;  ?  ESOPHAGOGASTRODUODENOSCOPY (EGD) WITH PROPOFOL  06/17/2021  ? Procedure: ESOPHAGOGASTRODUODENOSCOPY (EGD) WITH PROPOFOL;  Surgeon: Lin Landsman, MD;  Location: [facility] ENDOSCOPY;  Service: Gastroenterology;;  ? KNEE ARTHROSCOPY WITH LATERAL MENISECTOMY Right 05/08/2020  ? Procedure: RIGHT KNEE ARTHROSCOPY WITH DEBRIDEMENT AND PARTIAL LATERAL MENISCECTOMY;  Surgeon: Corky Mull, MD;  Location: [facility] ORS;  Service: Orthopedics;  Laterality: Right;  ? LAPAROSCOPIC APPENDECTOMY N/A 12/21/2018  ? Procedure: APPENDECTOMY LAPAROSCOPIC CONVERTED TO OPEN;  Surgeon: Benjamine Sprague, DO;  Location: [facility] ORS;  Service: General;  Laterality: N/A;  ? TONSILLECTOMY    ? ?Family History  ?Problem Relation Age of Onset  ? Hyperlipidemia Mother   ? Thyroid disease Mother   ? Hypertension Mother   ? Urinary tract infection Mother   ? Colon cancer Father   ? Stroke Father   ? Heart disease Father   ?     myocardial infarction  ? Breast cancer Neg Hx   ? ?Social History  ? ?Socioeconomic History  ? Marital status: Married  ?  Spouse name: Not on file  ? Number of children: 2  ? Years of education: Not on file  ? Highest education level: Not on file  ?Occupational History  ? Not on file  ?Tobacco Use  ? Smoking status: Former  ?  Packs/day: 1.00  ?  Years: 30.00  ?  Pack years: 30.00  ?  Types: Cigarettes  ?  Quit date: 04/26/2004  ?  Years since quitting: 17.4  ? Smokeless tobacco: Never  ?Vaping Use  ? Vaping Use: Never used  ?Substance and Sexual Activity  ? Alcohol use: Yes  ?  Alcohol/week: 0.0 standard drinks  ?  Comment: occassional wine every other day  ? Drug use: No  ? Sexual activity: Yes  ?  Birth control/protection: Post-menopausal  ?Other Topics Concern  ? Not on file  ?Social History Narrative  ? She is married, has two children. Works at [employer]  ? ?Social Determinants of Health  ? ?Financial Resource Strain: Not on file  ?Food Insecurity: Not on file  ?Transportation Needs: Not on file   ?Physical Activity: Not on file  ?Stress: Not on file  ?Social Connections: Not on file  ?Intimate Partner Violence: Not on file  ? ?Current Meds  ?Medication Sig  ? acetaminophen (TYLENOL) 500 MG tablet Take 1,000 mg by mouth every 6 (six) hours as needed for moderate pain.  ? aspirin EC 81 MG EC tablet Take 1 tablet (81 mg total) by mouth daily. Swallow whole.  ? Calcium Carb-Cholecalciferol (CALCIUM 500 +D) 500-400 MG-UNIT TABS Take 1 tablet by mouth daily.  ? Cholecalciferol (VITAMIN D) 2000 UNITS tablet Take 2,000 Units by mouth daily.  ? ciprofloxacin (CIPRO) 500 MG tablet Take 1 tablet (500 mg total) by mouth 2 (two) times daily for 5 days. With food  ? Cranberry 450 MG CAPS Take 450 mg by mouth daily.  ? denosumab (PROLIA) 60 MG/ML SOSY injection Inject 60 mg into the skin every 6 (six) months.   ? fluticasone-salmeterol (ADVAIR HFA) 230-21 MCG/ACT inhaler Inhale 2 puffs into the lungs 2 (two) times daily.  ? ibuprofen (ADVIL) 800 MG tablet Take 800 mg by mouth 3 (three) times daily.  ? omeprazole (PRILOSEC) 20 MG capsule TAKE 1 CAPSULE BY MOUTH ONCE DAILY  ? pravastatin (PRAVACHOL) 40 MG tablet Take 1 tablet (40 mg total) by mouth daily.  ? ?Allergies  ?Allergen Reactions  ? Benzoin Compound Other (See Comments)  ?  After breast surgery ?After breast surgery  ? Boniva [Ibandronic Acid] Other (See Comments)  ?  Aching ?  ? Codeine Nausea Only  ? Fosamax [Alendronate Sodium]   ?  Caused aching  ? Lipitor [Atorvastatin] Hives  ? Meloxicam Other (See Comments)  ?  GI Upset  ? Penicillins Swelling  ?  Facial swelling ?Did it involve swelling of the face/tongue/throat, SOB, or low BP? Yes ?Did it involve sudden or severe rash/hives, skin peeling, or any reaction on the inside of your mouth or nose? No ?Did you need to seek medical attention at a hospital or doctor's office? No ?When did it last happen?      within the last 10 years ?If all above answers are ?NO?, may proceed with cephalosporin use. ?  ? ?Recent  Results (from the past 2160 hour(s))  ?CBC with Differential/Platelet     Status: None  ? Collection Time: 07/23/21 11:47 AM  ?Result Value Ref Range  ? WBC 4.7 4.0 - 10.5 K/uL  ? RBC 4.65 3.87 - 5.11 Mil/uL  ? Hemoglobin 14.0 12.0 - 15.0 g/dL  ? HCT 43.1 36.0 - 46.0 %  ? MCV 92.6 78.0 - 100.0 fl  ? MCHC 32.4 30.0 - 36.0 g/dL  ? RDW 13.6 11.5 - 15.5 %  ? Platelets 301.0 150.0 - 400.0 K/uL  ? Neutrophils Relative % 62.4 43.0 -  77.0 %  ? Lymphocytes Relative 25.5 12.0 - 46.0 %  ? Monocytes Relative 9.7 3.0 - 12.0 %  ? Eosinophils Relative 1.7 0.0 - 5.0 %  ? Basophils Relative 0.7 0.0 - 3.0 %  ? Neutro Abs 2.9 1.4 - 7.7 K/uL  ? Lymphs Abs 1.2 0.7 - 4.0 K/uL  ? Monocytes Absolute 0.5 0.1 - 1.0 K/uL  ? Eosinophils Absolute 0.1 0.0 - 0.7 K/uL  ? Basophils Absolute 0.0 0.0 - 0.1 K/uL  ?Protime-INR     Status: None  ? Collection Time: 07/23/21 11:47 AM  ?Result Value Ref Range  ? INR 0.9 0.8 - 1.0 ratio  ? Prothrombin Time 10.3 9.6 - 13.1 sec  ?Hemoglobin A1c     Status: None  ? Collection Time: 07/23/21 11:47 AM  ?Result Value Ref Range  ? Hgb A1c MFr Bld 5.6 4.6 - 6.5 %  ?  Comment: Glycemic Control Guidelines for People with Diabetes:Non Diabetic:  <6%Goal of Therapy: <7%Additional Action Suggested:  >8%   ?Urinalysis, Routine w reflex microscopic     Status: Abnormal  ? Collection Time: 07/23/21 11:47 AM  ?Result Value Ref Range  ? Color, Urine YELLOW Yellow;Lt. Yellow;Straw;Dark Yellow;Amber;Green;Red;Brown  ? APPearance CLEAR Clear;Turbid;Slightly Cloudy;Cloudy  ? Specific Gravity, Urine >=1.030 (A) 1.000 - 1.030  ? pH 5.5 5.0 - 8.0  ? Total Protein, Urine NEGATIVE Negative  ? Urine Glucose NEGATIVE Negative  ? Ketones, ur NEGATIVE Negative  ? Bilirubin Urine NEGATIVE Negative  ? Hgb urine dipstick NEGATIVE Negative  ? Urobilinogen, UA 0.2 0.0 - 1.0  ? Leukocytes,Ua NEGATIVE Negative  ? Nitrite NEGATIVE Negative  ? WBC, UA 0-2/hpf 0-2/hpf  ? RBC / HPF none seen 0-2/hpf  ? Mucus, UA Presence of (A) None  ? Squamous  Epithelial / LPF Rare(0-4/hpf) Rare(0-4/hpf)  ? Hyaline Casts, UA Presence of (A) None  ? Ca Oxalate Crys, UA Presence of (A) None  ?Comp Met (CMET)     Status: None  ? Collection Time: 07/23/21 11:47 AM  ?Result Value

## 2021-09-28 ENCOUNTER — Encounter: Payer: Self-pay | Admitting: Internal Medicine

## 2021-09-28 NOTE — Assessment & Plan Note (Signed)
Colonoscopy 06/2021.  Recommended f/u in 3 years.  

## 2021-09-28 NOTE — Assessment & Plan Note (Signed)
Saw endocrinology.  Recommended f/u ultrasound and TSH in 12 months.   

## 2021-09-28 NOTE — Assessment & Plan Note (Signed)
Upper symptoms controlled on current PPI.   

## 2021-09-28 NOTE — Assessment & Plan Note (Signed)
Doing well.  PT.  Keep f/u with orho.  ?

## 2021-09-28 NOTE — Assessment & Plan Note (Signed)
On pravastatin.  Low cholesterol diet and exercise.  Follow lipid panel and liver function tests.   

## 2021-09-28 NOTE — Assessment & Plan Note (Signed)
Overall appears to be handling things well.  Follow.  ?

## 2021-09-28 NOTE — Assessment & Plan Note (Signed)
Receiving prolia.  

## 2021-09-28 NOTE — Assessment & Plan Note (Signed)
Oral B12.  

## 2021-09-28 NOTE — Assessment & Plan Note (Signed)
Follow up with pulmonary.  Advair.  Breathing stable.   ?

## 2021-09-28 NOTE — Assessment & Plan Note (Signed)
Seeing Dr Mortimer Fries.  CT 11/2020 - lung nodules.  Recommended f/u CT chest in 6 months.  Discussed.  Overdue f/u with Dr Mortimer Fries.  Planning f/u with pulmonary.  ?

## 2021-09-28 NOTE — Assessment & Plan Note (Signed)
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  On plavix and aspirin.  Symptoms improved.  Discussed continued exercise.  Discussed f/u with neurology.  Saw Dr Manuella Ghazi - 08/2021 - recommended daily aspirin.  Off plavix.  Ordered HST.  ?

## 2021-09-30 LAB — URINE CULTURE
MICRO NUMBER:: 13145857
SPECIMEN QUALITY:: ADEQUATE

## 2021-10-02 ENCOUNTER — Other Ambulatory Visit: Payer: Self-pay

## 2021-10-02 ENCOUNTER — Ambulatory Visit
Admission: RE | Admit: 2021-10-02 | Discharge: 2021-10-02 | Disposition: A | Discharge status: Home / Self Care | Payer: Medicare HMO | Source: Ambulatory Visit | Attending: Primary Care | Admitting: Primary Care

## 2021-10-02 DIAGNOSIS — R918 Other nonspecific abnormal finding of lung field: Secondary | ICD-10-CM | POA: Insufficient documentation

## 2021-10-02 IMAGING — CT CT CHEST W/O CM
2 of 4 series · 14 of 36 positions shown, 17 images · non-contrast
Comparison: [DATE] chest CT.

CLINICAL DATA: Follow-up pulmonary nodules.

* Tracking Code: BO *
EXAM:
CT CHEST WITHOUT CONTRAST
TECHNIQUE: Multidetector CT imaging of the chest was performed following the
standard protocol without IV contrast.
RADIATION DOSE REDUCTION: This exam was performed according to the
departmental dose-optimization program which includes automated
exposure control, adjustment of the mA and/or kV according to
patient size and/or use of iterative reconstruction technique.

[Series 2: chest 2.00 · axial · 0.60mm/px · z∈[-1247,-957]mm · 11 of 173 slices shown, 14 images]
[im 14/173  mediastinal]
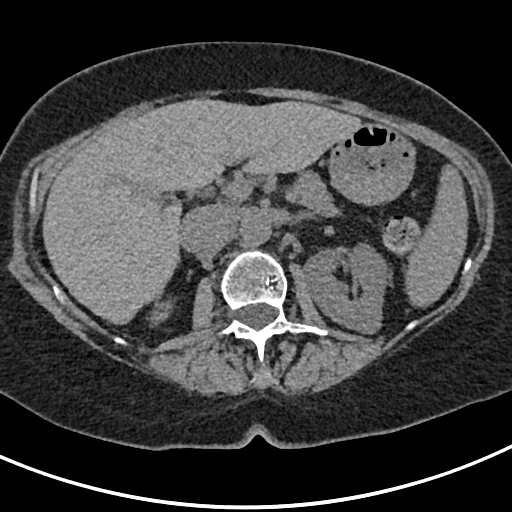
[im 14/173  lung]
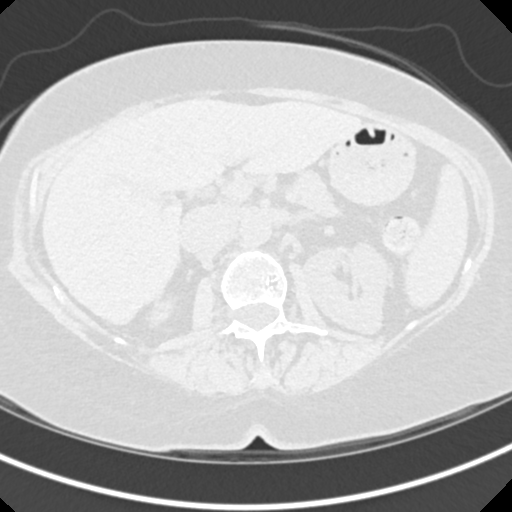
[im 27/173  lung]
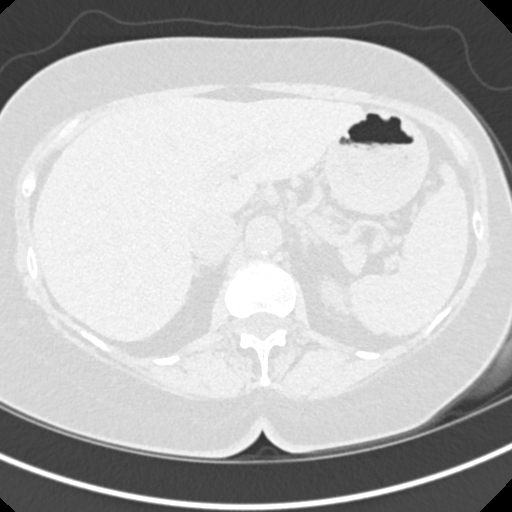
[im 40/173  lung]
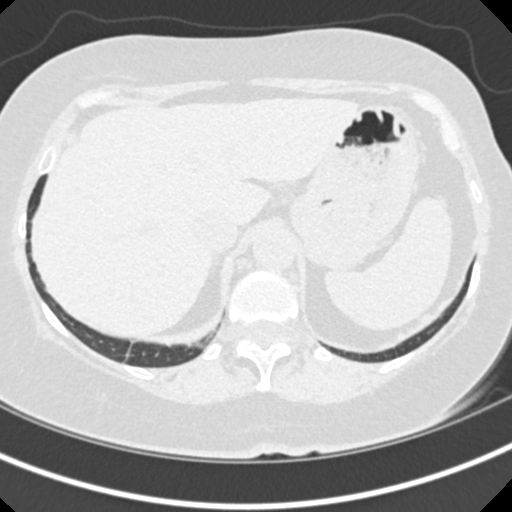
[im 53/173  lung]
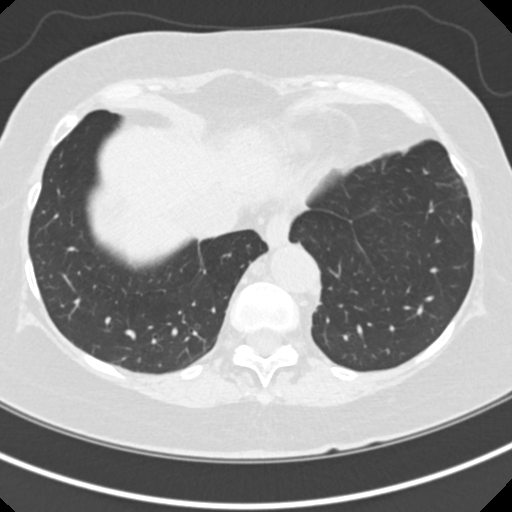
[im 67/173  mediastinal]
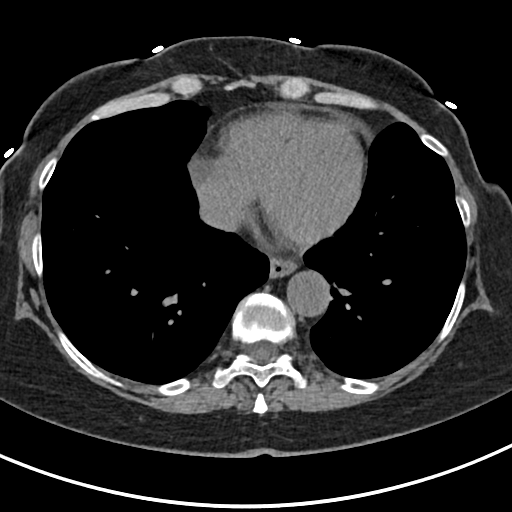
[im 67/173  lung]
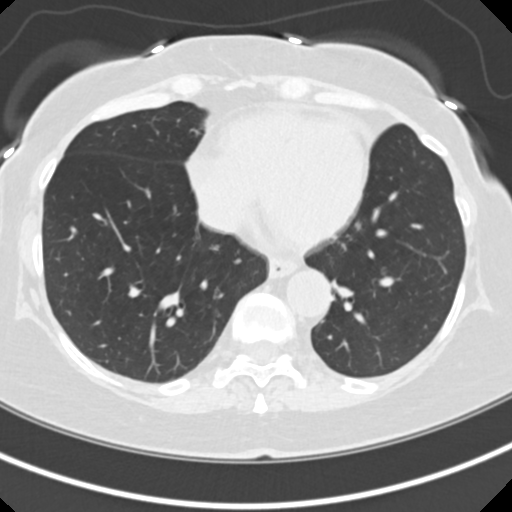
[im 93/173  lung]
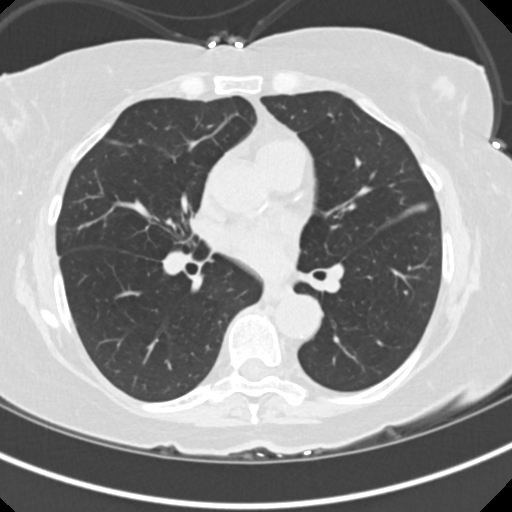
[im 106/173  lung]
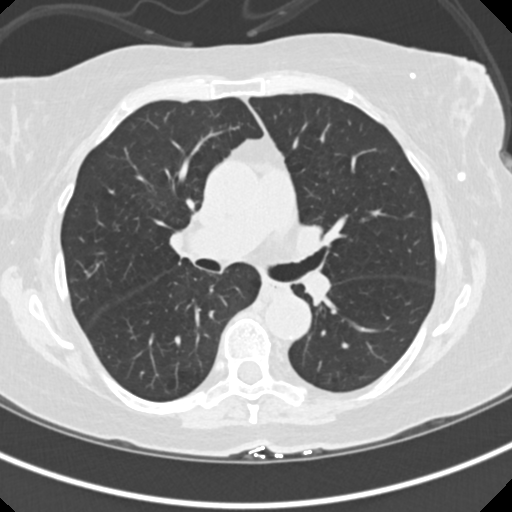
[im 120/173  lung]
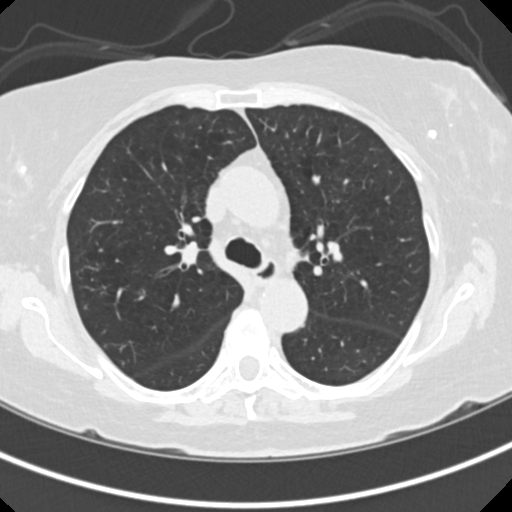
[im 133/173  mediastinal]
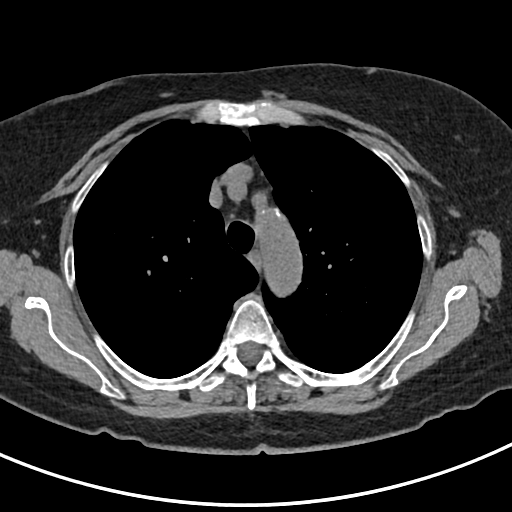
[im 133/173  lung]
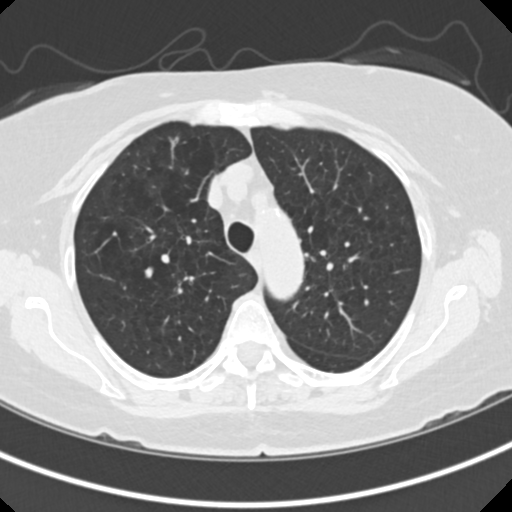
[im 146/173  lung]
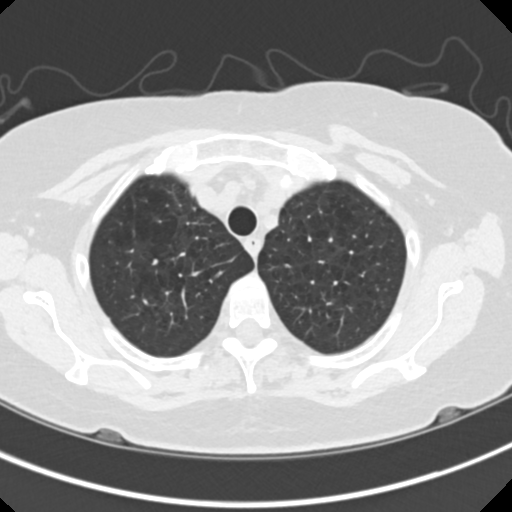
[im 159/173  lung]
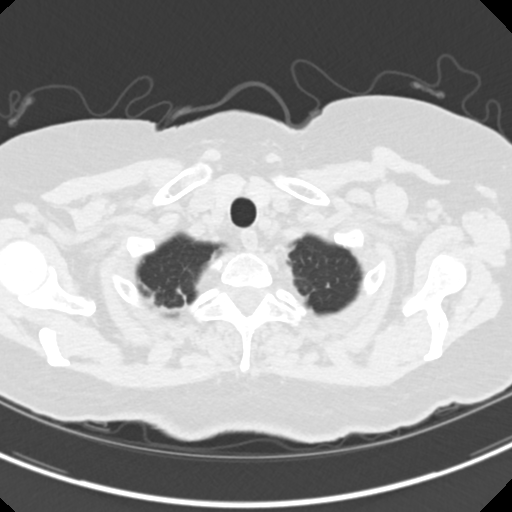

[Series 5: coronals chest 2.00 cor · coronal · 0.60mm/px · 3 of 131 slices shown]
[im 27/131  lung]
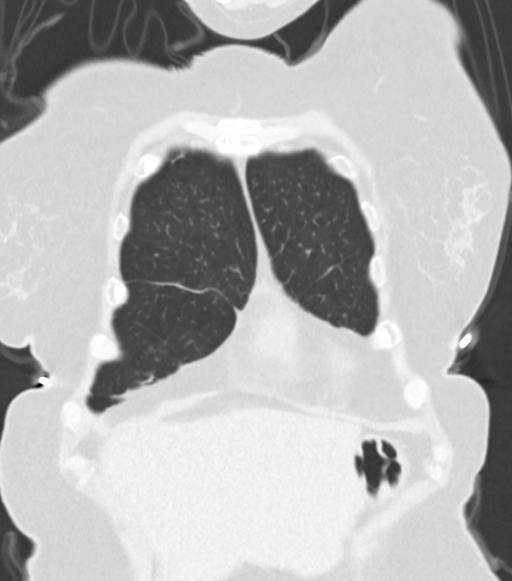
[im 53/131  lung]
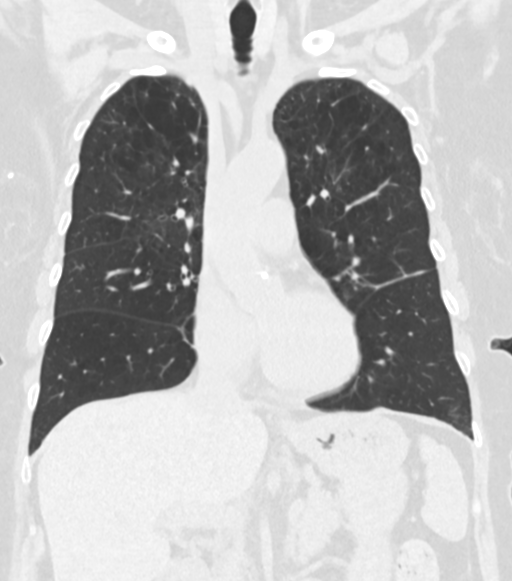
[im 79/131  lung]
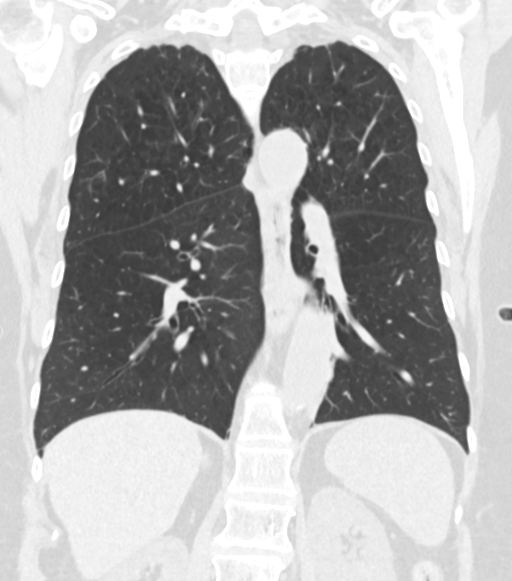

[14 of 36 positions shown; findings below may reference images not displayed]

FINDINGS: Cardiovascular: Normal heart size. Stable trace anterior pericardial
effusion/thickening. Left anterior descending coronary
atherosclerosis. Atherosclerotic nonaneurysmal thoracic aorta.
Normal caliber pulmonary arteries.

Mediastinum/Nodes: Hypodense 1.8 cm posterior right thyroid nodule
is stable. This has been evaluated on previous imaging. (ref: [HOSPITAL]. [DATE]): 143-50). Unremarkable esophagus. No
pathologically enlarged axillary, mediastinal or hilar lymph nodes,
noting limited sensitivity for the detection of hilar adenopathy on
this noncontrast study.

Lungs/Pleura: No pneumothorax. No pleural effusion. Moderate
centrilobular emphysema with mild diffuse bronchial wall thickening.
No acute consolidative airspace disease or lung masses. Stable
parenchymal bands in the posterior lingula and anterior right middle
lobe compatible with nonspecific postinfectious/postinflammatory
scarring. The previously described 1.6 x 1.1 cm subsolid posterior
apical right upper lobe pulmonary nodule is decreased to 0.8 x
cm, and there is a new adjacent more posteriorly located subsolid
0.6 cm pulmonary nodule in the posterior apical right upper lobe
(series 3/image 22). Scattered clustered indistinct small solid
pulmonary nodules in a tree-in-bud configuration throughout the
medial right middle lobe are similar to mildly increased, for
example a new 0.3 cm solid medial right upper lobe nodule (series
3/image 34) and a stable 0.6 cm medial right upper lobe nodule
(series 3/image 47). Minimal patchy tree-in-bud opacity in the
peripheral right upper lobe is stable.

Upper abdomen: No acute abnormality.

Musculoskeletal: No aggressive appearing focal osseous lesions. Mild
thoracic spondylosis.
IMPRESSION: 1. Continued waxing and waning subsolid and tree-in-bud type
pulmonary nodularity throughout the right upper lobe, most
suggestive of a mild infectious or inflammatory process such as
atypical mycobacterial infection ([id number]). Previously described
dominant subsolid apical right upper lobe nodule is decreased. New
small tree-in-bud solid and subsolid nodules in the right upper lobe
as detailed. Given the patient's risk factors, follow-up noncontrast
chest CT recommended in 6-12 months.
2. Moderate centrilobular emphysema with mild diffuse bronchial wall
thickening, suggesting COPD.
3. One vessel coronary atherosclerosis.
4. Aortic Atherosclerosis ([IV]-[IV]) and Emphysema ([IV]-[IV]).

## 2021-10-28 ENCOUNTER — Encounter: Payer: Self-pay | Admitting: Internal Medicine

## 2021-10-28 ENCOUNTER — Other Ambulatory Visit: Payer: Self-pay | Admitting: Internal Medicine

## 2021-10-29 ENCOUNTER — Ambulatory Visit (INDEPENDENT_AMBULATORY_CARE_PROVIDER_SITE_OTHER): Payer: Medicare HMO | Admitting: Internal Medicine

## 2021-10-29 ENCOUNTER — Encounter: Payer: Self-pay | Admitting: Internal Medicine

## 2021-10-29 VITALS — BP 134/80 | HR 64 | Temp 97.9°F | Resp 14 | Ht 64.0 in | Wt 179.8 lb

## 2021-10-29 DIAGNOSIS — Z96651 Presence of right artificial knee joint: Secondary | ICD-10-CM

## 2021-10-29 DIAGNOSIS — Z860100 Personal history of colon polyps, unspecified: Secondary | ICD-10-CM

## 2021-10-29 DIAGNOSIS — J449 Chronic obstructive pulmonary disease, unspecified: Secondary | ICD-10-CM

## 2021-10-29 DIAGNOSIS — M8580 Other specified disorders of bone density and structure, unspecified site: Secondary | ICD-10-CM

## 2021-10-29 DIAGNOSIS — E7800 Pure hypercholesterolemia, unspecified: Secondary | ICD-10-CM | POA: Diagnosis not present

## 2021-10-29 DIAGNOSIS — E538 Deficiency of other specified B group vitamins: Secondary | ICD-10-CM

## 2021-10-29 DIAGNOSIS — K219 Gastro-esophageal reflux disease without esophagitis: Secondary | ICD-10-CM | POA: Diagnosis not present

## 2021-10-29 DIAGNOSIS — R918 Other nonspecific abnormal finding of lung field: Secondary | ICD-10-CM

## 2021-10-29 DIAGNOSIS — F439 Reaction to severe stress, unspecified: Secondary | ICD-10-CM

## 2021-10-29 DIAGNOSIS — Z8673 Personal history of transient ischemic attack (TIA), and cerebral infarction without residual deficits: Secondary | ICD-10-CM

## 2021-10-29 DIAGNOSIS — D649 Anemia, unspecified: Secondary | ICD-10-CM

## 2021-10-29 DIAGNOSIS — E041 Nontoxic single thyroid nodule: Secondary | ICD-10-CM

## 2021-10-29 NOTE — Patient Instructions (Signed)
Oral B12 1016mg per day.  ?

## 2021-10-29 NOTE — Progress Notes (Signed)
Patient ID: Alexis Reed, female   DOB: 1946/07/06, 76 y.o.   MRN: [id number] ? ? ?Subjective:  ? ? Patient ID: Alexis Reed, female    DOB: 1945/10/25, 76 y.o.   MRN: [id number] ? ?This visit occurred during the SARS-CoV-2 public health emergency.  Safety protocols were in place, including screening questions prior to the visit, additional usage of staff PPE, and extensive cleaning of exam room while observing appropriate contact time as indicated for disinfecting solutions.  ? ?Patient here for a scheduled follow up.  ? ?Chief Complaint  ?Patient presents with  ? Follow-up  ?  Follow up post knee replacement - pt wishes to discuss low b12, and coming off Prolia  ? .  ? ?HPI ?Recent admission for CVA.  Doing well.  Tries to stay active.  Seeing neurology - Dr Manuella Ghazi.  Recent labs.  Low B12. Discussed replacement.  No chest pain or sob reported.  No abdominal pain or bowel change reported.  Knee is doing better.  Some discomfort at night.  Discussed bone density.  Dr Gabriel Carina recommended holding prolia.  Discussed calcium and vitamin D supplements.  Discussed weight bearing exercise.   ? ? ?Past Medical History:  ?Diagnosis Date  ? Arthritis   ? Benign breast lumps   ? multiple lumps biopsy and remove x's 5 last being 1979 by Dr. Sharlet Salina  ? COPD (chronic obstructive pulmonary disease) ([place])   ? MILD-RARELY EVER HAS TO USE COMBIVENT INHALER  ? Fibroids   ? s/p hysterectomy  ? GERD (gastroesophageal reflux disease)   ? Hypercholesterolemia   ? Osteoporosis   ? Personal history of tobacco use, presenting hazards to health 08/07/2015  ? Urinary tract infection   ? ?Past Surgical History:  ?Procedure Laterality Date  ? ABDOMINAL HYSTERECTOMY  1997  ? with bilateral oophorectomy  ? BREAST BIOPSY Left   ? multiple done  ? BREAST BIOPSY Right 2016  ? stereo- neg. FC changes  ? BREAST EXCISIONAL BIOPSY Right 1975  ? multiple biopsies done  ? BREAST SURGERY Left   ? lump removed. Unsure of date  ? BREAST SURGERY Right   ? lump  removed. Unsure of date  ? COLONOSCOPY  2010  ? Dr. Vira Agar  ? COLONOSCOPY WITH PROPOFOL N/A 06/17/2021  ? Procedure: COLONOSCOPY WITH PROPOFOL;  Surgeon: Lin Landsman, MD;  Location: [facility] ENDOSCOPY;  Service: Gastroenterology;  Laterality: N/A;  ? ESOPHAGOGASTRODUODENOSCOPY (EGD) WITH PROPOFOL  06/17/2021  ? Procedure: ESOPHAGOGASTRODUODENOSCOPY (EGD) WITH PROPOFOL;  Surgeon: Lin Landsman, MD;  Location: [facility] ENDOSCOPY;  Service: Gastroenterology;;  ? KNEE ARTHROSCOPY WITH LATERAL MENISECTOMY Right 05/08/2020  ? Procedure: RIGHT KNEE ARTHROSCOPY WITH DEBRIDEMENT AND PARTIAL LATERAL MENISCECTOMY;  Surgeon: Corky Mull, MD;  Location: [facility] ORS;  Service: Orthopedics;  Laterality: Right;  ? LAPAROSCOPIC APPENDECTOMY N/A 12/21/2018  ? Procedure: APPENDECTOMY LAPAROSCOPIC CONVERTED TO OPEN;  Surgeon: Benjamine Sprague, DO;  Location: [facility] ORS;  Service: General;  Laterality: N/A;  ? TONSILLECTOMY    ? ?Family History  ?Problem Relation Age of Onset  ? Hyperlipidemia Mother   ? Thyroid disease Mother   ? Hypertension Mother   ? Urinary tract infection Mother   ? Colon cancer Father   ? Stroke Father   ? Heart disease Father   ?     myocardial infarction  ? Breast cancer Neg Hx   ? ?Social History  ? ?Socioeconomic History  ? Marital status: Married  ?  Spouse name: Not on file  ?  Number of children: 2  ? Years of education: Not on file  ? Highest education level: Not on file  ?Occupational History  ? Not on file  ?Tobacco Use  ? Smoking status: Former  ?  Packs/day: 1.00  ?  Years: 30.00  ?  Pack years: 30.00  ?  Types: Cigarettes  ?  Quit date: 04/26/2004  ?  Years since quitting: 17.5  ? Smokeless tobacco: Never  ?Vaping Use  ? Vaping Use: Never used  ?Substance and Sexual Activity  ? Alcohol use: Yes  ?  Alcohol/week: 0.0 standard drinks  ?  Comment: occassional wine every other day  ? Drug use: No  ? Sexual activity: Yes  ?  Birth control/protection: Post-menopausal  ?Other Topics Concern  ? Not on file   ?Social History Narrative  ? She is married, has two children. Works at [employer]  ? ?Social Determinants of Health  ? ?Financial Resource Strain: Not on file  ?Food Insecurity: Not on file  ?Transportation Needs: Not on file  ?Physical Activity: Not on file  ?Stress: Not on file  ?Social Connections: Not on file  ? ? ? ?Review of Systems  ?Constitutional:  Negative for appetite change and unexpected weight change.  ?HENT:  Negative for congestion and sinus pressure.   ?Respiratory:  Negative for cough, chest tightness and shortness of breath.   ?Cardiovascular:  Negative for chest pain, palpitations and leg swelling.  ?Gastrointestinal:  Negative for abdominal pain, diarrhea, nausea and vomiting.  ?Genitourinary:  Negative for difficulty urinating and dysuria.  ?Musculoskeletal:  Negative for myalgias.  ?     S/p knee surgery.  Still some knee pain as outlined.   ?Skin:  Negative for color change and rash.  ?Neurological:  Negative for dizziness, light-headedness and headaches.  ?Psychiatric/Behavioral:  Negative for agitation and dysphoric mood.   ? ?   ?Objective:  ?  ? ?BP 134/80 (BP Location: Left Arm, Patient Position: Sitting, Cuff Size: Small)   Pulse 64   Temp 97.9 ?F (36.6 ?C) (Temporal)   Resp 14   Ht '5\' 4"'$  (1.626 m)   Wt 179 lb 12.8 oz (81.6 kg)   LMP 07/20/1995   SpO2 98%   BMI 30.86 kg/m?  ?Wt Readings from Last 3 Encounters:  ?10/29/21 179 lb 12.8 oz (81.6 kg)  ?09/27/21 181 lb (82.1 kg)  ?09/17/21 184 lb (83.5 kg)  ? ? ?Physical Exam ?Vitals reviewed.  ?Constitutional:   ?   General: She is not in acute distress. ?   Appearance: Normal appearance.  ?HENT:  ?   Head: Normocephalic and atraumatic.  ?   Right Ear: External ear normal.  ?   Left Ear: External ear normal.  ?Eyes:  ?   General: No scleral icterus.    ?   Right eye: No discharge.     ?   Left eye: No discharge.  ?   Conjunctiva/sclera: Conjunctivae normal.  ?Neck:  ?   Thyroid: No thyromegaly.   ?Cardiovascular:  ?   Rate and Rhythm: Normal rate and regular rhythm.  ?Pulmonary:  ?   Effort: No respiratory distress.  ?   Breath sounds: Normal breath sounds. No wheezing.  ?Abdominal:  ?   General: Bowel sounds are normal.  ?   Palpations: Abdomen is soft.  ?   Tenderness: There is no abdominal tenderness.  ?Musculoskeletal:     ?   General: No swelling or tenderness.  ?  Cervical back: Neck supple. No tenderness.  ?Lymphadenopathy:  ?   Cervical: No cervical adenopathy.  ?Skin: ?   Findings: No erythema or rash.  ?Neurological:  ?   Mental Status: She is alert.  ?Psychiatric:     ?   Mood and Affect: Mood normal.     ?   Behavior: Behavior normal.  ? ? ? ?Outpatient Encounter Medications as of 10/29/2021  ?Medication Sig  ? acetaminophen (TYLENOL) 500 MG tablet Take 1,000 mg by mouth every 6 (six) hours as needed for moderate pain.  ? aspirin EC 81 MG EC tablet Take 1 tablet (81 mg total) by mouth daily. Swallow whole.  ? Calcium Carb-Cholecalciferol (CALCIUM 500 +D) 500-400 MG-UNIT TABS Take 1 tablet by mouth daily.  ? Cholecalciferol (VITAMIN D) 2000 UNITS tablet Take 2,000 Units by mouth daily.  ? Cranberry 450 MG CAPS Take 450 mg by mouth daily.  ? denosumab (PROLIA) 60 MG/ML SOSY injection Inject 60 mg into the skin every 6 (six) months.   ? fluticasone-salmeterol (ADVAIR HFA) 230-21 MCG/ACT inhaler Inhale 2 puffs into the lungs 2 (two) times daily.  ? ibuprofen (ADVIL) 800 MG tablet Take 800 mg by mouth 3 (three) times daily.  ? omeprazole (PRILOSEC) 20 MG capsule TAKE 1 CAPSULE BY MOUTH ONCE DAILY  ? [DISCONTINUED] pravastatin (PRAVACHOL) 40 MG tablet Take 1 tablet (40 mg total) by mouth daily.  ? ?No facility-administered encounter medications on file as of 10/29/2021.  ?  ? ?Lab Results  ?Component Value Date  ? WBC 5.5 08/03/2021  ? HGB 8.7 (L) 08/03/2021  ? HCT 27.1 (L) 08/03/2021  ? PLT 259 08/03/2021  ? GLUCOSE 106 (H) 08/03/2021  ? CHOL 125 08/02/2021  ? TRIG 61 08/02/2021  ? HDL 47 08/02/2021   ? LDLDIRECT 153.0 07/17/2015  ? [place] 66 08/02/2021  ? ALT 14 07/23/2021  ? AST 14 07/23/2021  ? NA 136 08/03/2021  ? K 3.6 08/03/2021  ? CL 98 08/03/2021  ? CREATININE 0.53 08/03/2021  ? BUN 8 08/03/2021  ? CO2 27 01/21/2

## 2021-11-02 ENCOUNTER — Encounter: Payer: Self-pay | Admitting: Internal Medicine

## 2021-11-02 DIAGNOSIS — D649 Anemia, unspecified: Secondary | ICD-10-CM | POA: Insufficient documentation

## 2021-11-02 NOTE — Assessment & Plan Note (Signed)
Follow up with pulmonary.  Advair.  Breathing stable.   ?

## 2021-11-02 NOTE — Assessment & Plan Note (Signed)
Upper symptoms controlled on current PPI.   

## 2021-11-02 NOTE — Assessment & Plan Note (Signed)
Colonoscopy 06/2021.  Recommended f/u in 3 years.  

## 2021-11-02 NOTE — Assessment & Plan Note (Signed)
Seeing Dr Mortimer Fries.  CT 11/2020 - lung nodules.  Recommended f/u CT chest in 6 months.  Had f/u CT 09/2021.  Per review of radiology report - recommended f/u in 6-12 months.  ?

## 2021-11-02 NOTE — Assessment & Plan Note (Signed)
On pravastatin.  Low cholesterol diet and exercise.  Follow lipid panel and liver function tests.   

## 2021-11-02 NOTE — Assessment & Plan Note (Signed)
Saw endocrinology.  Recommended f/u ultrasound and TSH in 12 months.   

## 2021-11-02 NOTE — Assessment & Plan Note (Signed)
Doing relatively well.  Continue f/u with ortho.  ?

## 2021-11-02 NOTE — Assessment & Plan Note (Signed)
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  On plavix and aspirin.  Symptoms improved.  Discussed continued exercise.  Discussed f/u with neurology.  Seeing Dr Manuella Ghazi.  Had HST.   ?

## 2021-11-02 NOTE — Assessment & Plan Note (Signed)
Recent bone density reviewed and discussed with her.  Endocrinology recommended stopping prolia.  Continue calcium, vitamin D and weight bearing exercise.  Discussed.  Follow.   ?

## 2021-11-02 NOTE — Assessment & Plan Note (Signed)
Overall appears to be handling things well.  Follow.  ?

## 2021-11-02 NOTE — Assessment & Plan Note (Signed)
Oral B12.  

## 2021-11-02 NOTE — Assessment & Plan Note (Signed)
Noted to be anemic - on recent labs.  Recheck cbc, ferritin and B12 with next labs.  ?

## 2021-11-05 ENCOUNTER — Encounter: Payer: Self-pay | Admitting: Urology

## 2021-11-05 ENCOUNTER — Ambulatory Visit: Payer: Medicare HMO | Admitting: Urology

## 2021-11-05 VITALS — BP 118/78 | HR 82 | Ht 62.0 in | Wt 179.0 lb

## 2021-11-05 DIAGNOSIS — Z87440 Personal history of urinary (tract) infections: Secondary | ICD-10-CM | POA: Diagnosis not present

## 2021-11-05 DIAGNOSIS — N390 Urinary tract infection, site not specified: Secondary | ICD-10-CM

## 2021-11-05 MED ORDER — NITROFURANTOIN MONOHYD MACRO 100 MG PO CAPS: 100.0000 mg | ORAL_CAPSULE | Freq: Two times a day (BID) | ORAL | 0 refills | Status: DC

## 2021-11-05 NOTE — Progress Notes (Signed)
? ?  11/05/2021 ?12:44 PM  ? ?Alexis Reed ?[birth year] ?[id number] ? ?Reason for visit: Follow up recurrent UTIs ? ?HPI: ?76 year old female who I followed for recurrent UTIs since January 2022.  She ultimately opted for cranberry tablets and a 90-day course of nitrofurantoin prophylaxis, and she deferred topical estrogen cream at that time.  She had been doing very well since her last visit in April 2022 until recent knee replacement in January which was complicated by UTI and stroke.  She also had a Foley catheter at that time.  She was diagnosed with a culture documented E. coli UTI at the end of January 2023, as well as again in March 2023.  She denies any UTI type symptoms today. ? ?I recommended again considering the topical estrogen cream for UTI prevention, but she would like to hold off at this time.  She feels the UTI were associated with her Foley catheter and recent knee surgery, she was having some trouble with hygiene and wiping at that time.  I think this is reasonable, but I again discussed at length that if she has recurrent infections I would strongly recommend considering the topical estrogen cream, and I think we can work with her insurance to find an affordable generic version. ? ?I personally viewed and interpreted the CT abdomen and pelvis from January 2023 that shows no urologic abnormalities, hydronephrosis, nephrolithiasis, and bladder decompressed with Foley. ? ?She also has an upcoming trip out of the country, and is interested in a antibiotic prescription in case she were to develop a UTI while traveling.  I think this is reasonable, and sent in a Macrobid 100 mg twice daily x7 days prescription as needed if she develops a UTI during her upcoming travel. ? ?Consider topical estrogen cream in the future if recurrent infections ?RTC 1 year symptom check ? ?Billey Co, MD ? ?[place] ?[address], [address] ?[place], [place] [address] ?[phone] ?  ?

## 2021-11-19 ENCOUNTER — Other Ambulatory Visit: Payer: Self-pay | Admitting: Internal Medicine

## 2021-11-19 DIAGNOSIS — J449 Chronic obstructive pulmonary disease, unspecified: Secondary | ICD-10-CM

## 2021-11-21 ENCOUNTER — Other Ambulatory Visit (INDEPENDENT_AMBULATORY_CARE_PROVIDER_SITE_OTHER): Payer: Medicare HMO

## 2021-11-21 DIAGNOSIS — E7800 Pure hypercholesterolemia, unspecified: Secondary | ICD-10-CM | POA: Diagnosis not present

## 2021-11-21 DIAGNOSIS — D649 Anemia, unspecified: Secondary | ICD-10-CM | POA: Diagnosis not present

## 2021-11-21 LAB — LIPID PANEL
Cholesterol: 187 mg/dL (ref 0–200)
HDL: 58.2 mg/dL (ref >39.00)
LDL Cholesterol: 106 mg/dL — ABNORMAL HIGH (ref 0–99)
NonHDL: 128.5
Total CHOL/HDL Ratio: 3
Triglycerides: 114 mg/dL (ref 0.0–149.0)
VLDL: 22.8 mg/dL (ref 0.0–40.0)

## 2021-11-21 LAB — HEPATIC FUNCTION PANEL
ALT: 11 U/L (ref 0–35)
AST: 12 U/L (ref 0–37)
Albumin: 4.2 g/dL (ref 3.5–5.2)
Alkaline Phosphatase: 68 U/L (ref 39–117)
Bilirubin, Direct: 0.1 mg/dL (ref 0.0–0.3)
Total Bilirubin: 0.6 mg/dL (ref 0.2–1.2)
Total Protein: 6.4 g/dL (ref 6.0–8.3)

## 2021-11-21 LAB — BASIC METABOLIC PANEL
BUN: 17 mg/dL (ref 6–23)
CO2: 27 mEq/L (ref 19–32)
Calcium: 9.3 mg/dL (ref 8.4–10.5)
Chloride: 106 mEq/L (ref 96–112)
Creatinine, Ser: 0.62 mg/dL (ref 0.40–1.20)
GFR: 88.16 mL/min (ref >60.00)
Glucose, Bld: 96 mg/dL (ref 70–99)
Potassium: 4 mEq/L (ref 3.5–5.1)
Sodium: 141 mEq/L (ref 135–145)

## 2021-11-21 LAB — CBC WITH DIFFERENTIAL/PLATELET
Basophils Absolute: 0 10*3/uL (ref 0.0–0.1)
Basophils Relative: 0.7 % (ref 0.0–3.0)
Eosinophils Absolute: 0.1 10*3/uL (ref 0.0–0.7)
Eosinophils Relative: 2.5 % (ref 0.0–5.0)
HCT: 41.1 % (ref 36.0–46.0)
Hemoglobin: 13.6 g/dL (ref 12.0–15.0)
Lymphocytes Relative: 23.3 % (ref 12.0–46.0)
Lymphs Abs: 1.1 10*3/uL (ref 0.7–4.0)
MCHC: 33 g/dL (ref 30.0–36.0)
MCV: 90 fl (ref 78.0–100.0)
Monocytes Absolute: 0.4 10*3/uL (ref 0.1–1.0)
Monocytes Relative: 8.5 % (ref 3.0–12.0)
Neutro Abs: 3.1 10*3/uL (ref 1.4–7.7)
Neutrophils Relative %: 65 % (ref 43.0–77.0)
Platelets: 257 10*3/uL (ref 150.0–400.0)
RBC: 4.57 Mil/uL (ref 3.87–5.11)
RDW: 14.6 % (ref 11.5–15.5)
WBC: 4.8 10*3/uL (ref 4.0–10.5)

## 2021-11-21 LAB — VITAMIN B12: Vitamin B-12: 592 pg/mL (ref 211–911)

## 2021-11-21 LAB — FERRITIN: Ferritin: 121.2 ng/mL (ref 10.0–291.0)

## 2021-11-26 ENCOUNTER — Telehealth: Payer: Self-pay | Admitting: Internal Medicine

## 2021-11-26 NOTE — Telephone Encounter (Signed)
Pt returning cma call ?

## 2021-12-04 ENCOUNTER — Other Ambulatory Visit: Payer: Self-pay

## 2021-12-04 MED ORDER — PRAVASTATIN SODIUM 80 MG PO TABS: 80.0000 mg | ORAL_TABLET | Freq: Every day | ORAL | 3 refills | Status: DC

## 2021-12-17 ENCOUNTER — Telehealth: Payer: Self-pay

## 2021-12-17 NOTE — Telephone Encounter (Signed)
Alexis Reed KeyGerlene Fee -  Rx #: [id number]  Outcome  The patient currently has access to the requested medication and a Prior Authorization is not needed for the patient/medication.

## 2021-12-19 NOTE — Progress Notes (Signed)
She had waxing and waning nodules on CT chest in march. Solid component of RUL nodule has decreased. Recommend getting repeat CT chest in Sept and seeing Dr. Mortimer Fries for follow-up after. If she is having any acute respiratory symptoms would have her come in for visit and get sputum sample.

## 2021-12-23 ENCOUNTER — Other Ambulatory Visit: Payer: Self-pay

## 2021-12-23 DIAGNOSIS — R911 Solitary pulmonary nodule: Secondary | ICD-10-CM

## 2021-12-30 ENCOUNTER — Encounter: Payer: Self-pay | Admitting: Internal Medicine

## 2022-01-20 ENCOUNTER — Other Ambulatory Visit: Payer: Self-pay | Admitting: Internal Medicine

## 2022-01-20 DIAGNOSIS — J449 Chronic obstructive pulmonary disease, unspecified: Secondary | ICD-10-CM

## 2022-02-11 ENCOUNTER — Ambulatory Visit (INDEPENDENT_AMBULATORY_CARE_PROVIDER_SITE_OTHER): Payer: Medicare HMO | Admitting: Internal Medicine

## 2022-02-11 ENCOUNTER — Encounter: Payer: Self-pay | Admitting: Internal Medicine

## 2022-02-11 VITALS — BP 110/68 | HR 78 | Temp 97.4°F | Ht 62.0 in | Wt 181.0 lb

## 2022-02-11 DIAGNOSIS — K219 Gastro-esophageal reflux disease without esophagitis: Secondary | ICD-10-CM

## 2022-02-11 DIAGNOSIS — E7800 Pure hypercholesterolemia, unspecified: Secondary | ICD-10-CM

## 2022-02-11 DIAGNOSIS — R918 Other nonspecific abnormal finding of lung field: Secondary | ICD-10-CM

## 2022-02-11 DIAGNOSIS — Z1231 Encounter for screening mammogram for malignant neoplasm of breast: Secondary | ICD-10-CM

## 2022-02-11 DIAGNOSIS — J449 Chronic obstructive pulmonary disease, unspecified: Secondary | ICD-10-CM

## 2022-02-11 DIAGNOSIS — Z86010 Personal history of colonic polyps: Secondary | ICD-10-CM

## 2022-02-11 DIAGNOSIS — Z8673 Personal history of transient ischemic attack (TIA), and cerebral infarction without residual deficits: Secondary | ICD-10-CM

## 2022-02-11 DIAGNOSIS — E041 Nontoxic single thyroid nodule: Secondary | ICD-10-CM | POA: Diagnosis not present

## 2022-02-11 DIAGNOSIS — D649 Anemia, unspecified: Secondary | ICD-10-CM

## 2022-02-11 NOTE — Progress Notes (Signed)
Patient ID: Alexis Reed, female   DOB: [birth year], 76 y.o.   MRN: [id number]   Subjective:    Patient ID: Alexis Reed, female    DOB: January 20, 1946, 76 y.o.   MRN: [id number]   Patient here for a scheduled follow up.   Chief Complaint  Patient presents with   Follow-up    3 month follow up   .   HPI Here to follow up regarding previous CVA and hypercholesterolemia.  States she is doing relatively well.  Seeing pulmonary.  Planning f/u chest CT in September.  Saw neurology 5/22 - recommended continuing aspirin and pravastatin.  Had recommended speech therapy.  Questions answered regarding therapy.  Still having some issues with word finding. Has improved. Previous HST 10/2021 - mild positional OSA.  CPAP.  Tries to stay active.  No chest pain or sob reported.  No abdominal pain or bowel change reported.     Past Medical History:  Diagnosis Date   Arthritis    Benign breast lumps    multiple lumps biopsy and remove x's 5 last being 1979 by Dr. Sharlet Salina   COPD (chronic obstructive pulmonary disease) ([place])    MILD-RARELY EVER HAS TO USE COMBIVENT INHALER   Fibroids    s/p hysterectomy   GERD (gastroesophageal reflux disease)    Hypercholesterolemia    Osteoporosis    Personal history of tobacco use, presenting hazards to health 08/07/2015   Urinary tract infection    Past Surgical History:  Procedure Laterality Date   ABDOMINAL HYSTERECTOMY  1997   with bilateral oophorectomy   BREAST BIOPSY Left    multiple done   BREAST BIOPSY Right 2016   stereo- neg. FC changes   BREAST EXCISIONAL BIOPSY Right 1975   multiple biopsies done   BREAST SURGERY Left    lump removed. Unsure of date   BREAST SURGERY Right    lump removed. Unsure of date   COLONOSCOPY  2010   Dr. Vira Agar   COLONOSCOPY WITH PROPOFOL N/A 06/17/2021   Procedure: COLONOSCOPY WITH PROPOFOL;  Surgeon: Lin Landsman, MD;  Location: [facility] ENDOSCOPY;  Service: Gastroenterology;  Laterality: N/A;    ESOPHAGOGASTRODUODENOSCOPY (EGD) WITH PROPOFOL  06/17/2021   Procedure: ESOPHAGOGASTRODUODENOSCOPY (EGD) WITH PROPOFOL;  Surgeon: Lin Landsman, MD;  Location: [facility] ENDOSCOPY;  Service: Gastroenterology;;   KNEE ARTHROSCOPY WITH LATERAL MENISECTOMY Right 05/08/2020   Procedure: RIGHT KNEE ARTHROSCOPY WITH DEBRIDEMENT AND PARTIAL LATERAL MENISCECTOMY;  Surgeon: Corky Mull, MD;  Location: [facility] ORS;  Service: Orthopedics;  Laterality: Right;   LAPAROSCOPIC APPENDECTOMY N/A 12/21/2018   Procedure: APPENDECTOMY LAPAROSCOPIC CONVERTED TO OPEN;  Surgeon: Benjamine Sprague, DO;  Location: [facility] ORS;  Service: General;  Laterality: N/A;   TONSILLECTOMY     Family History  Problem Relation Age of Onset   Hyperlipidemia Mother    Thyroid disease Mother    Hypertension Mother    Urinary tract infection Mother    Colon cancer Father    Stroke Father    Heart disease Father        myocardial infarction   Breast cancer Neg Hx    Social History   Socioeconomic History   Marital status: Married    Spouse name: Not on file   Number of children: 2   Years of education: Not on file   Highest education level: Not on file  Occupational History   Not on file  Tobacco Use   Smoking status: Former    Packs/day: 1.00  Years: 30.00    Total pack years: 30.00    Types: Cigarettes    Quit date: 04/26/2004    Years since quitting: 17.8    Passive exposure: Past   Smokeless tobacco: Never  Vaping Use   Vaping Use: Never used  Substance and Sexual Activity   Alcohol use: Yes    Alcohol/week: 0.0 standard drinks of alcohol    Comment: occassional wine every other day   Drug use: No   Sexual activity: Yes    Birth control/protection: Post-menopausal  Other Topics Concern   Not on file  Social History Narrative   She is married, has two children. Works at [employer] Strain: Not on file  Food Insecurity: Not on file   Transportation Needs: Not on file  Physical Activity: Not on file  Stress: Not on file  Social Connections: Not on file     Review of Systems  Constitutional:  Negative for appetite change and unexpected weight change.  HENT:  Negative for congestion and sinus pressure.   Respiratory:  Negative for cough, chest tightness and shortness of breath.   Cardiovascular:  Negative for chest pain, palpitations and leg swelling.  Gastrointestinal:  Negative for abdominal pain, diarrhea, nausea and vomiting.  Genitourinary:  Negative for difficulty urinating and dysuria.  Musculoskeletal:  Negative for joint swelling and myalgias.  Skin:  Negative for color change and rash.  Neurological:  Negative for dizziness, light-headedness and headaches.  Psychiatric/Behavioral:  Negative for agitation and dysphoric mood.        Objective:     BP 110/68 (BP Location: Left Arm, Patient Position: Sitting, Cuff Size: Normal)   Pulse 78   Temp (!) 97.4 F (36.3 C) (Oral)   Ht '5\' 2"'$  (1.575 m)   Wt 181 lb (82.1 kg)   LMP 07/20/1995   SpO2 94%   BMI 33.11 kg/m  Wt Readings from Last 3 Encounters:  02/11/22 181 lb (82.1 kg)  11/05/21 179 lb (81.2 kg)  10/29/21 179 lb 12.8 oz (81.6 kg)    Physical Exam Vitals reviewed.  Constitutional:      General: She is not in acute distress.    Appearance: Normal appearance.  HENT:     Head: Normocephalic and atraumatic.     Right Ear: External ear normal.     Left Ear: External ear normal.  Eyes:     General: No scleral icterus.       Right eye: No discharge.        Left eye: No discharge.     Conjunctiva/sclera: Conjunctivae normal.  Neck:     Thyroid: No thyromegaly.  Cardiovascular:     Rate and Rhythm: Normal rate and regular rhythm.  Pulmonary:     Effort: No respiratory distress.     Breath sounds: Normal breath sounds. No wheezing.  Abdominal:     General: Bowel sounds are normal.     Palpations: Abdomen is soft.     Tenderness: There  is no abdominal tenderness.  Musculoskeletal:        General: No swelling or tenderness.     Cervical back: Neck supple. No tenderness.  Lymphadenopathy:     Cervical: No cervical adenopathy.  Skin:    Findings: No erythema or rash.  Neurological:     Mental Status: She is alert.  Psychiatric:        Mood and Affect: Mood normal.  Behavior: Behavior normal.      Outpatient Encounter Medications as of 02/11/2022  Medication Sig   acetaminophen (TYLENOL) 500 MG tablet Take 1,000 mg by mouth every 6 (six) hours as needed for moderate pain.   ADVAIR HFA 230-21 MCG/ACT inhaler INHALE 2 PUFFS BY MOUTH TWICE DAILY   aspirin EC 81 MG EC tablet Take 1 tablet (81 mg total) by mouth daily. Swallow whole.   Calcium Carb-Cholecalciferol (CALCIUM 500 +D) 500-400 MG-UNIT TABS Take 1 tablet by mouth daily.   Cholecalciferol (VITAMIN D) 2000 UNITS tablet Take 2,000 Units by mouth daily.   COMBIVENT RESPIMAT 20-100 MCG/ACT AERS respimat INHALE 1 PUF BY MOUTH EVERY 6 HOURS AS NEEDED WHEEZING   Cranberry 450 MG CAPS Take 450 mg by mouth daily.   ibuprofen (ADVIL) 800 MG tablet Take 800 mg by mouth 3 (three) times daily.   nitrofurantoin, macrocrystal-monohydrate, (MACROBID) 100 MG capsule Take 1 capsule (100 mg total) by mouth every 12 (twelve) hours.   omeprazole (PRILOSEC) 20 MG capsule TAKE 1 CAPSULE BY MOUTH ONCE DAILY   pravastatin (PRAVACHOL) 80 MG tablet Take 1 tablet (80 mg total) by mouth daily.   denosumab (PROLIA) 60 MG/ML SOSY injection Inject 60 mg into the skin every 6 (six) months.  (Patient not taking: Reported on 02/11/2022)   No facility-administered encounter medications on file as of 02/11/2022.     Lab Results  Component Value Date   WBC 4.8 11/21/2021   HGB 13.6 11/21/2021   HCT 41.1 11/21/2021   PLT 257.0 11/21/2021   GLUCOSE 96 11/21/2021   CHOL 187 11/21/2021   TRIG 114.0 11/21/2021   HDL 58.20 11/21/2021   LDLDIRECT 153.0 07/17/2015   LDLCALC 106 (H) 11/21/2021    ALT 11 11/21/2021   AST 12 11/21/2021   NA 141 11/21/2021   K 4.0 11/21/2021   CL 106 11/21/2021   CREATININE 0.62 11/21/2021   BUN 17 11/21/2021   CO2 27 11/21/2021   TSH 1.571 08/02/2021   INR 0.9 07/23/2021   HGBA1C 5.5 08/02/2021    CT Chest Wo Contrast  Result Date: 10/05/2021 CLINICAL DATA:  Follow-up pulmonary nodules. * Tracking Code: BO * EXAM: CT CHEST WITHOUT CONTRAST TECHNIQUE: Multidetector CT imaging of the chest was performed following the standard protocol without IV contrast. RADIATION DOSE REDUCTION: This exam was performed according to the departmental dose-optimization program which includes automated exposure control, adjustment of the mA and/or kV according to patient size and/or use of iterative reconstruction technique. COMPARISON:  12/03/2020 chest CT. FINDINGS: Cardiovascular: Normal heart size. Stable trace anterior pericardial effusion/thickening. Left anterior descending coronary atherosclerosis. Atherosclerotic nonaneurysmal thoracic aorta. Normal caliber pulmonary arteries. Mediastinum/Nodes: Hypodense 1.8 cm posterior right thyroid nodule is stable. This has been evaluated on previous imaging. (ref: J Am Coll Radiol. 2015 Feb;12(2): 143-50). Unremarkable esophagus. No pathologically enlarged axillary, mediastinal or hilar lymph nodes, noting limited sensitivity for the detection of hilar adenopathy on this noncontrast study. Lungs/Pleura: No pneumothorax. No pleural effusion. Moderate centrilobular emphysema with mild diffuse bronchial wall thickening. No acute consolidative airspace disease or lung masses. Stable parenchymal bands in the posterior lingula and anterior right middle lobe compatible with nonspecific postinfectious/postinflammatory scarring. The previously described 1.6 x 1.1 cm subsolid posterior apical right upper lobe pulmonary nodule is decreased to 0.8 x 0.6 cm, and there is a new adjacent more posteriorly located subsolid 0.6 cm pulmonary nodule in  the posterior apical right upper lobe (series 3/image 22). Scattered clustered indistinct small solid pulmonary nodules in  a tree-in-bud configuration throughout the medial right middle lobe are similar to mildly increased, for example a new 0.3 cm solid medial right upper lobe nodule (series 3/image 34) and a stable 0.6 cm medial right upper lobe nodule (series 3/image 47). Minimal patchy tree-in-bud opacity in the peripheral right upper lobe is stable. Upper abdomen: No acute abnormality. Musculoskeletal: No aggressive appearing focal osseous lesions. Mild thoracic spondylosis. IMPRESSION: 1. Continued waxing and waning subsolid and tree-in-bud type pulmonary nodularity throughout the right upper lobe, most suggestive of a mild infectious or inflammatory process such as atypical mycobacterial infection (MAI). Previously described dominant subsolid apical right upper lobe nodule is decreased. New small tree-in-bud solid and subsolid nodules in the right upper lobe as detailed. Given the patient's risk factors, follow-up noncontrast chest CT recommended in 6-12 months. 2. Moderate centrilobular emphysema with mild diffuse bronchial wall thickening, suggesting COPD. 3. One vessel coronary atherosclerosis. 4. Aortic Atherosclerosis (ICD10-I70.0) and Emphysema (ICD10-J43.9). Electronically Signed   By: Ilona Sorrel M.D.   On: 10/05/2021 15:06       Assessment & Plan:   Problem List Items Addressed This Visit     Anemia    hgb recheck 11/21/21 - wnl.  B12 and ferritin wnl.       Breast cancer screening - Primary   Relevant Orders   MM 3D SCREEN BREAST BILATERAL   COPD (chronic obstructive pulmonary disease) ([place])    Follow up with pulmonary.  Advair.  Breathing stable.  Planned f/u CT and Dr Mortimer Fries - September.       GERD (gastroesophageal reflux disease)    Upper symptoms controlled on current PPI.        History of colonic polyps    Colonoscopy 06/2021.  Recommended f/u in 3 years.        History of CVA (cerebrovascular accident)    Found to have acute corpus callosum infarct.  On plavix and aspirin.  Symptoms improved.  Discussed continued exercise.  Seeing Dr Manuella Ghazi.  Had HST as outlined.  Continue aspirin daily.       Hypercholesterolemia    On pravastatin.  Low cholesterol diet and exercise. Follow lipid panel and liver function tests.        Relevant Orders   Hepatic function panel   Lipid panel   Basic metabolic panel   Lung nodules    Seeing Dr Mortimer Fries.  CT 11/2020 - lung nodules.  Recommended f/u CT chest in 6 months.  Had f/u CT 09/2021.  Per review of radiology report - recommended f/u in 6-12 months.  Plan f/u CT in 03/2022 and f/u with Dr Mortimer Fries.       Thyroid nodule    Saw endocrinology.  Recommended f/u ultrasound and TSH in 12 months.        Relevant Orders   TSH     Einar Pheasant, MD

## 2022-02-17 ENCOUNTER — Encounter: Payer: Self-pay | Admitting: Internal Medicine

## 2022-02-17 NOTE — Assessment & Plan Note (Signed)
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  On plavix and aspirin.  Symptoms improved.  Discussed continued exercise.  Seeing Dr Manuella Ghazi.  Had HST as outlined.  Continue aspirin daily.

## 2022-02-17 NOTE — Assessment & Plan Note (Signed)
Follow up with pulmonary.  Advair.  Breathing stable.  Planned f/u CT and Dr Mortimer Fries - September.

## 2022-02-17 NOTE — Assessment & Plan Note (Signed)
Colonoscopy 06/2021.  Recommended f/u in 3 years.  

## 2022-02-17 NOTE — Assessment & Plan Note (Signed)
hgb recheck 11/21/21 - wnl.  B12 and ferritin wnl.

## 2022-02-17 NOTE — Assessment & Plan Note (Signed)
Seeing Dr Mortimer Fries.  CT 11/2020 - lung nodules.  Recommended f/u CT chest in 6 months.  Had f/u CT 09/2021.  Per review of radiology report - recommended f/u in 6-12 months.  Plan f/u CT in 03/2022 and f/u with Dr Mortimer Fries.

## 2022-02-17 NOTE — Assessment & Plan Note (Signed)
Upper symptoms controlled on current PPI.   

## 2022-02-17 NOTE — Assessment & Plan Note (Signed)
Saw endocrinology.  Recommended f/u ultrasound and TSH in 12 months.   

## 2022-02-17 NOTE — Assessment & Plan Note (Signed)
On pravastatin.  Low cholesterol diet and exercise.  Follow lipid panel and liver function tests.   

## 2022-03-07 ENCOUNTER — Ambulatory Visit
Admission: RE | Admit: 2022-03-07 | Discharge: 2022-03-07 | Disposition: A | Discharge status: Home / Self Care | Payer: Medicare HMO | Source: Ambulatory Visit | Attending: Primary Care | Admitting: Primary Care

## 2022-03-07 DIAGNOSIS — R911 Solitary pulmonary nodule: Secondary | ICD-10-CM | POA: Diagnosis present

## 2022-03-14 NOTE — Progress Notes (Signed)
Please let patient know nodule right upper lung has enlarged in size, we need to follow up short term in another 3 months to continue to monitor. She is seeing Dr. Mortimer Fries on 03/19/22- please have her speak with him about nodule in more detail. He can order CT imaging at office visit

## 2022-03-18 ENCOUNTER — Ambulatory Visit
Admission: RE | Admit: 2022-03-18 | Discharge: 2022-03-18 | Disposition: A | Discharge status: Home / Self Care | Payer: Medicare HMO | Source: Ambulatory Visit | Attending: Internal Medicine | Admitting: Internal Medicine

## 2022-03-18 DIAGNOSIS — Z1231 Encounter for screening mammogram for malignant neoplasm of breast: Secondary | ICD-10-CM | POA: Insufficient documentation

## 2022-03-19 ENCOUNTER — Other Ambulatory Visit: Payer: Self-pay | Admitting: Internal Medicine

## 2022-03-19 ENCOUNTER — Encounter: Payer: Self-pay | Admitting: Internal Medicine

## 2022-03-19 ENCOUNTER — Ambulatory Visit: Payer: Medicare HMO | Admitting: Internal Medicine

## 2022-03-19 VITALS — BP 128/80 | HR 79 | Temp 98.5°F | Ht 62.0 in | Wt 183.0 lb

## 2022-03-19 DIAGNOSIS — R911 Solitary pulmonary nodule: Secondary | ICD-10-CM | POA: Diagnosis not present

## 2022-03-19 DIAGNOSIS — J449 Chronic obstructive pulmonary disease, unspecified: Secondary | ICD-10-CM | POA: Diagnosis not present

## 2022-03-19 NOTE — Patient Instructions (Addendum)
Continue inhalers as prescribed Recommend repeat CT chest in 3 months We will have assessment in the lung nodule clinic with Dr. Patsey Berthold

## 2022-03-19 NOTE — Progress Notes (Addendum)
Date:  03/19/2022   ID:  Minna Antis, DOB October 02, 1945, MRN [id number]  Patient Location:  [address] [place] [id number]   Provider location:   [place]  PCP:  Einar Pheasant, MD   SYNOPSIS Patient with a history of nodular densities subcentimeter in the past associated with increased work of breathing and shortness of breath with wheezing  I will recommend CT chest to assess for interval changes  Patient has been in lung cancer screening in the past for the last 5 years Patient had previous evidence of CT scan showing tree-in-bud pattern nodular opacity in the right upper lobe  Quit tobacco abuse 17 years ago Smoked 1 pack a day for 30 years Previous history of COPD exacerbation  July 2002 PFT's 54% predicted, +BD response  REPEAT CT CHEST 02/2022 SHOWS ABNORMALITIES SLIGHTLY ENLARGING RUL NODULE    CC Follow up COPD Follow up ABNORMAL CT CHEST   HPI  No exacerbation at this time No evidence of heart failure at this time No evidence or signs of infection at this time No respiratory distress No fevers, chills, nausea, vomiting, diarrhea No evidence of lower extremity edema No evidence hemoptysis     CT of the chest April 21, 2019 reviewed in detail with patient Independently reviewed Shows right upper lobe nodular opacities possible tree-in-bud pattern    ABNORMAL RUL NODULE DISCUSSED WITH PATIENT 02/2022    Current Meds  Medication Sig   acetaminophen (TYLENOL) 500 MG tablet Take 1,000 mg by mouth every 6 (six) hours as needed for moderate pain.   ADVAIR HFA 230-21 MCG/ACT inhaler INHALE 2 PUFFS BY MOUTH TWICE DAILY   aspirin EC 81 MG EC tablet Take 1 tablet (81 mg total) by mouth daily. Swallow whole.   Calcium Carb-Cholecalciferol (CALCIUM 500 +D) 500-400 MG-UNIT TABS Take 1 tablet by mouth daily.   Cholecalciferol (VITAMIN D) 2000 UNITS tablet Take 2,000 Units by mouth daily.   COMBIVENT RESPIMAT 20-100  MCG/ACT AERS respimat INHALE 1 PUF BY MOUTH EVERY 6 HOURS AS NEEDED WHEEZING   Cranberry 450 MG CAPS Take 450 mg by mouth daily.   ibuprofen (ADVIL) 800 MG tablet Take 800 mg by mouth 3 (three) times daily.   omeprazole (PRILOSEC) 20 MG capsule TAKE 1 CAPSULE BY MOUTH ONCE DAILY   pravastatin (PRAVACHOL) 80 MG tablet Take 1 tablet (80 mg total) by mouth daily.   [DISCONTINUED] denosumab (PROLIA) 60 MG/ML SOSY injection Inject 60 mg into the skin every 6 (six) months.   [DISCONTINUED] nitrofurantoin, macrocrystal-monohydrate, (MACROBID) 100 MG capsule Take 1 capsule (100 mg total) by mouth every 12 (twelve) hours.     Allergies:   Benzoin compound, Boniva [ibandronic acid], Codeine, Fosamax [alendronate sodium], Lipitor [atorvastatin], Meloxicam, and Penicillins     Review of Systems: Gen:  Denies  fever, sweats, chills weight loss  HEENT: Denies blurred vision, double vision, ear pain, eye pain, hearing loss, nose bleeds, sore throat Cardiac:  No dizziness, chest pain or heaviness, chest tightness,edema, No JVD Resp:   No cough, -sputum production, -shortness of breath,-wheezing, -hemoptysis,  Other:  All other systems negative  BP 128/80 (BP Location: Left Arm, Cuff Size: Normal)   Pulse 79   Temp 98.5 F (36.9 C) (Temporal)   Ht '5\' 2"'$  (1.575 m)   Wt 183 lb (83 kg)   LMP 07/20/1995   SpO2 96%   BMI 33.47 kg/m    Physical Examination:   General Appearance: No distress  EYES  PERRLA, EOM intact.   NECK Supple, No JVD Pulmonary: normal breath sounds, No wheezing.  CardiovascularNormal S1,S2.  No m/r/g.   Abdomen: Benign, Soft, non-tender. ALL OTHER ROS ARE NEGATIVE        Outpatient Encounter Medications as of 03/19/2022  Medication Sig   acetaminophen (TYLENOL) 500 MG tablet Take 1,000 mg by mouth every 6 (six) hours as needed for moderate pain.   ADVAIR HFA 230-21 MCG/ACT inhaler INHALE 2 PUFFS BY MOUTH TWICE DAILY   aspirin EC 81 MG EC tablet Take 1 tablet (81 mg  total) by mouth daily. Swallow whole.   Calcium Carb-Cholecalciferol (CALCIUM 500 +D) 500-400 MG-UNIT TABS Take 1 tablet by mouth daily.   Cholecalciferol (VITAMIN D) 2000 UNITS tablet Take 2,000 Units by mouth daily.   COMBIVENT RESPIMAT 20-100 MCG/ACT AERS respimat INHALE 1 PUF BY MOUTH EVERY 6 HOURS AS NEEDED WHEEZING   Cranberry 450 MG CAPS Take 450 mg by mouth daily.   ibuprofen (ADVIL) 800 MG tablet Take 800 mg by mouth 3 (three) times daily.   omeprazole (PRILOSEC) 20 MG capsule TAKE 1 CAPSULE BY MOUTH ONCE DAILY   pravastatin (PRAVACHOL) 80 MG tablet Take 1 tablet (80 mg total) by mouth daily.   [DISCONTINUED] denosumab (PROLIA) 60 MG/ML SOSY injection Inject 60 mg into the skin every 6 (six) months.   [DISCONTINUED] nitrofurantoin, macrocrystal-monohydrate, (MACROBID) 100 MG capsule Take 1 capsule (100 mg total) by mouth every 12 (twelve) hours.   No facility-administered encounter medications on file as of 03/19/2022.   Medications reviewed with patient     ASSESSMENT & PLAN:    76 year old pleasant white female seen today for follow-up assessment for COPD history of bronchiectasis with abnormal CT chest findings currently with a right upper lobe nodule at approximately 7 x 4 mm which has slightly enlarged since March 2023   Abnormal CT chest right upper lobe nodule Follow-up CT scan in 3 months Patient is at a high risk for malignancy due to extensive smoking history  COPD Symptoms are stable on inhaled corticosteroids and LABA Continue Advair as prescribed Patient advised to rinse mouth after every use Continue Combivent inhaler as needed No indication for antibiotics or prednisone at this time   MEDICATION ADJUSTMENTS/LABS AND TESTS ORDERED: ABNORMAL CT CHEST IN THE PAST-OBTAIN CT CHEST 3 MONTHS ADVAIR HFA 230 as prescribed Make sure to rinse mouth out after every use    CURRENT MEDICATIONS REVIEWED AT [place]   Patient satisfied with Plan of  action and management. All questions answered  Follow up in 3 months  Total time spent 21 mins  Maretta Bees Patricia Pesa, M.D.  [facility]  Medical Director [place] Director [facility] Cardio-Pulmonary Department

## 2022-03-26 ENCOUNTER — Other Ambulatory Visit (INDEPENDENT_AMBULATORY_CARE_PROVIDER_SITE_OTHER): Payer: Medicare HMO

## 2022-03-26 DIAGNOSIS — E7800 Pure hypercholesterolemia, unspecified: Secondary | ICD-10-CM

## 2022-03-26 DIAGNOSIS — E041 Nontoxic single thyroid nodule: Secondary | ICD-10-CM

## 2022-03-26 LAB — BASIC METABOLIC PANEL
BUN: 17 mg/dL (ref 6–23)
CO2: 29 mEq/L (ref 19–32)
Calcium: 10.2 mg/dL (ref 8.4–10.5)
Chloride: 102 mEq/L (ref 96–112)
Creatinine, Ser: 0.7 mg/dL (ref 0.40–1.20)
GFR: 85.42 mL/min (ref >60.00)
Glucose, Bld: 83 mg/dL (ref 70–99)
Potassium: 4.2 mEq/L (ref 3.5–5.1)
Sodium: 141 mEq/L (ref 135–145)

## 2022-03-26 LAB — LIPID PANEL
Cholesterol: 175 mg/dL (ref 0–200)
HDL: 66.2 mg/dL (ref >39.00)
LDL Cholesterol: 88 mg/dL (ref 0–99)
NonHDL: 108.36
Total CHOL/HDL Ratio: 3
Triglycerides: 103 mg/dL (ref 0.0–149.0)
VLDL: 20.6 mg/dL (ref 0.0–40.0)

## 2022-03-26 LAB — TSH: TSH: 2.15 u[IU]/mL (ref 0.35–5.50)

## 2022-03-26 LAB — HEPATIC FUNCTION PANEL
ALT: 13 U/L (ref 0–35)
AST: 15 U/L (ref 0–37)
Albumin: 3.9 g/dL (ref 3.5–5.2)
Alkaline Phosphatase: 115 U/L (ref 39–117)
Bilirubin, Direct: 0.1 mg/dL (ref 0.0–0.3)
Total Bilirubin: 0.6 mg/dL (ref 0.2–1.2)
Total Protein: 6.6 g/dL (ref 6.0–8.3)

## 2022-05-15 ENCOUNTER — Encounter: Payer: Medicare HMO | Admitting: Internal Medicine

## 2022-05-27 ENCOUNTER — Encounter: Payer: Self-pay | Admitting: Internal Medicine

## 2022-05-27 DIAGNOSIS — M545 Low back pain, unspecified: Secondary | ICD-10-CM | POA: Insufficient documentation

## 2022-05-28 ENCOUNTER — Encounter: Payer: Self-pay | Admitting: Internal Medicine

## 2022-05-28 ENCOUNTER — Ambulatory Visit (INDEPENDENT_AMBULATORY_CARE_PROVIDER_SITE_OTHER): Payer: Medicare HMO | Admitting: Internal Medicine

## 2022-05-28 VITALS — BP 138/88 | HR 73 | Temp 98.2°F | Resp 13 | Ht 62.0 in | Wt 186.0 lb

## 2022-05-28 DIAGNOSIS — F439 Reaction to severe stress, unspecified: Secondary | ICD-10-CM

## 2022-05-28 DIAGNOSIS — Z23 Encounter for immunization: Secondary | ICD-10-CM | POA: Diagnosis not present

## 2022-05-28 DIAGNOSIS — J449 Chronic obstructive pulmonary disease, unspecified: Secondary | ICD-10-CM | POA: Diagnosis not present

## 2022-05-28 DIAGNOSIS — Z86010 Personal history of colonic polyps: Secondary | ICD-10-CM

## 2022-05-28 DIAGNOSIS — R918 Other nonspecific abnormal finding of lung field: Secondary | ICD-10-CM

## 2022-05-28 DIAGNOSIS — K219 Gastro-esophageal reflux disease without esophagitis: Secondary | ICD-10-CM | POA: Diagnosis not present

## 2022-05-28 DIAGNOSIS — E7800 Pure hypercholesterolemia, unspecified: Secondary | ICD-10-CM

## 2022-05-28 DIAGNOSIS — Z0000 Encounter for general adult medical examination without abnormal findings: Secondary | ICD-10-CM | POA: Diagnosis not present

## 2022-05-28 DIAGNOSIS — M545 Low back pain, unspecified: Secondary | ICD-10-CM

## 2022-05-28 DIAGNOSIS — Z8673 Personal history of transient ischemic attack (TIA), and cerebral infarction without residual deficits: Secondary | ICD-10-CM

## 2022-05-28 DIAGNOSIS — E041 Nontoxic single thyroid nodule: Secondary | ICD-10-CM

## 2022-05-28 NOTE — Progress Notes (Signed)
Patient ID: Alexis Reed, female   DOB: August 14, 1945, 76 y.o.   MRN: [id number]   Subjective:    Patient ID: Alexis Reed, female    DOB: 06/05/1946, 76 y.o.   MRN: [id number]   Patient here for  Chief Complaint  Patient presents with   Annual Exam    CPE   .   HPI Here for physical. History of cva.  On aspirin and pravastatin.  Using cpap. Followed by Dr Mortimer Fries - abnormal CT RUL nodule - f/u CT scan in 3 months.  (Last seen 03/19/22).  Recommended continuing inhaled corticosteroids and LABA. Breathing stable.  No chest pain.  No increased cough or congestion reported.  No abdominal pain.  Is having back pain.  Reached to pick up books.  Increased pain.  Seeing [facility].  Xray - ok.  Given prednisone.  Taking tylenol.  No radicular symptoms.  No bowel or bladder issues.     Past Medical History:  Diagnosis Date   Arthritis    Benign breast lumps    multiple lumps biopsy and remove x's 5 last being 1979 by Dr. Sharlet Salina   COPD (chronic obstructive pulmonary disease) ([place])    MILD-RARELY EVER HAS TO USE COMBIVENT INHALER   Fibroids    s/p hysterectomy   GERD (gastroesophageal reflux disease)    Hypercholesterolemia    Osteoporosis    Personal history of tobacco use, presenting hazards to health 08/07/2015   Urinary tract infection    Past Surgical History:  Procedure Laterality Date   ABDOMINAL HYSTERECTOMY  1997   with bilateral oophorectomy   BREAST BIOPSY Left    multiple done   BREAST BIOPSY Right 2016   stereo- neg. FC changes   BREAST EXCISIONAL BIOPSY Right 1975   multiple biopsies done   BREAST SURGERY Left    lump removed. Unsure of date   BREAST SURGERY Right    lump removed. Unsure of date   COLONOSCOPY  2010   Dr. Vira Agar   COLONOSCOPY WITH PROPOFOL N/A 06/17/2021   Procedure: COLONOSCOPY WITH PROPOFOL;  Surgeon: Lin Landsman, MD;  Location: [facility] ENDOSCOPY;  Service: Gastroenterology;  Laterality: N/A;   ESOPHAGOGASTRODUODENOSCOPY (EGD) WITH PROPOFOL   06/17/2021   Procedure: ESOPHAGOGASTRODUODENOSCOPY (EGD) WITH PROPOFOL;  Surgeon: Lin Landsman, MD;  Location: [facility] ENDOSCOPY;  Service: Gastroenterology;;   KNEE ARTHROSCOPY WITH LATERAL MENISECTOMY Right 05/08/2020   Procedure: RIGHT KNEE ARTHROSCOPY WITH DEBRIDEMENT AND PARTIAL LATERAL MENISCECTOMY;  Surgeon: Corky Mull, MD;  Location: [facility] ORS;  Service: Orthopedics;  Laterality: Right;   LAPAROSCOPIC APPENDECTOMY N/A 12/21/2018   Procedure: APPENDECTOMY LAPAROSCOPIC CONVERTED TO OPEN;  Surgeon: Benjamine Sprague, DO;  Location: [facility] ORS;  Service: General;  Laterality: N/A;   TONSILLECTOMY     Family History  Problem Relation Age of Onset   Hyperlipidemia Mother    Thyroid disease Mother    Hypertension Mother    Urinary tract infection Mother    Colon cancer Father    Stroke Father    Heart disease Father        myocardial infarction   Breast cancer Neg Hx    Social History   Socioeconomic History   Marital status: Married    Spouse name: Not on file   Number of children: 2   Years of education: Not on file   Highest education level: Not on file  Occupational History   Not on file  Tobacco Use   Smoking status: Former    Packs/day:  1.00    Years: 30.00    Total pack years: 30.00    Types: Cigarettes    Quit date: 04/26/2004    Years since quitting: 18.1    Passive exposure: Past   Smokeless tobacco: Never  Vaping Use   Vaping Use: Never used  Substance and Sexual Activity   Alcohol use: Yes    Alcohol/week: 0.0 standard drinks of alcohol    Comment: occassional wine every other day   Drug use: No   Sexual activity: Yes    Birth control/protection: Post-menopausal  Other Topics Concern   Not on file  Social History Narrative   She is married, has two children. Works at [employer] Strain: Not on file  Food Insecurity: Not on file  Transportation Needs: Not on file  Physical  Activity: Not on file  Stress: Not on file  Social Connections: Not on file     Review of Systems  Constitutional:  Negative for appetite change and unexpected weight change.  HENT:  Negative for congestion, sinus pressure and sore throat.   Eyes:  Negative for pain and visual disturbance.  Respiratory:  Negative for cough, chest tightness and shortness of breath.   Cardiovascular:  Negative for chest pain, palpitations and leg swelling.  Gastrointestinal:  Negative for abdominal pain, diarrhea, nausea and vomiting.  Genitourinary:  Negative for difficulty urinating and dysuria.  Musculoskeletal:  Positive for back pain. Negative for joint swelling and myalgias.  Skin:  Negative for color change and rash.  Neurological:  Negative for dizziness and headaches.  Hematological:  Negative for adenopathy. Does not bruise/bleed easily.  Psychiatric/Behavioral:  Negative for agitation and dysphoric mood.        Objective:     BP 138/88 (BP Location: Left Arm, Patient Position: Sitting, Cuff Size: Large)   Pulse 73   Temp 98.2 F (36.8 C) (Temporal)   Resp 13   Ht '5\' 2"'$  (1.575 m)   Wt 186 lb (84.4 kg)   LMP 07/20/1995   SpO2 96%   BMI 34.02 kg/m  Wt Readings from Last 3 Encounters:  05/28/22 186 lb (84.4 kg)  03/19/22 183 lb (83 kg)  02/11/22 181 lb (82.1 kg)    Physical Exam Vitals reviewed.  Constitutional:      General: She is not in acute distress.    Appearance: Normal appearance. She is well-developed.  HENT:     Head: Normocephalic and atraumatic.     Right Ear: External ear normal.     Left Ear: External ear normal.  Eyes:     General: No scleral icterus.       Right eye: No discharge.        Left eye: No discharge.     Conjunctiva/sclera: Conjunctivae normal.  Neck:     Thyroid: No thyromegaly.  Cardiovascular:     Rate and Rhythm: Normal rate and regular rhythm.  Pulmonary:     Effort: No tachypnea, accessory muscle usage or respiratory distress.      Breath sounds: Normal breath sounds. No decreased breath sounds or wheezing.  Chest:  Breasts:    Right: No inverted nipple, mass, nipple discharge or tenderness (no axillary adenopathy).     Left: No inverted nipple, mass, nipple discharge or tenderness (no axilarry adenopathy).  Abdominal:     General: Bowel sounds are normal.     Palpations: Abdomen is soft.     Tenderness:  There is no abdominal tenderness.  Musculoskeletal:        General: No swelling or tenderness.     Cervical back: Neck supple.  Lymphadenopathy:     Cervical: No cervical adenopathy.  Skin:    Findings: No erythema or rash.  Neurological:     Mental Status: She is alert and oriented to person, place, and time.  Psychiatric:        Mood and Affect: Mood normal.        Behavior: Behavior normal.      Outpatient Encounter Medications as of 05/28/2022  Medication Sig   acetaminophen (TYLENOL) 500 MG tablet Take 1,000 mg by mouth every 6 (six) hours as needed for moderate pain.   ADVAIR HFA 230-21 MCG/ACT inhaler INHALE 2 PUFFS BY MOUTH TWICE DAILY   aspirin EC 81 MG EC tablet Take 1 tablet (81 mg total) by mouth daily. Swallow whole.   Calcium Carb-Cholecalciferol (CALCIUM 500 +D) 500-400 MG-UNIT TABS Take 1 tablet by mouth daily.   Cholecalciferol (VITAMIN D) 2000 UNITS tablet Take 2,000 Units by mouth daily.   COMBIVENT RESPIMAT 20-100 MCG/ACT AERS respimat INHALE 1 PUF BY MOUTH EVERY 6 HOURS AS NEEDED WHEEZING   Cranberry 450 MG CAPS Take 450 mg by mouth daily.   ibuprofen (ADVIL) 800 MG tablet Take 800 mg by mouth 3 (three) times daily.   omeprazole (PRILOSEC) 20 MG capsule TAKE 1 CAPSULE BY MOUTH ONCE DAILY   pravastatin (PRAVACHOL) 80 MG tablet Take 1 tablet (80 mg total) by mouth daily.   No facility-administered encounter medications on file as of 05/28/2022.     Lab Results  Component Value Date   WBC 4.8 11/21/2021   HGB 13.6 11/21/2021   HCT 41.1 11/21/2021   PLT 257.0 11/21/2021   GLUCOSE  83 03/26/2022   CHOL 175 03/26/2022   TRIG 103.0 03/26/2022   HDL 66.20 03/26/2022   LDLDIRECT 153.0 07/17/2015   LDLCALC 88 03/26/2022   ALT 13 03/26/2022   AST 15 03/26/2022   NA 141 03/26/2022   K 4.2 03/26/2022   CL 102 03/26/2022   CREATININE 0.70 03/26/2022   BUN 17 03/26/2022   CO2 29 03/26/2022   TSH 2.15 03/26/2022   INR 0.9 07/23/2021   HGBA1C 5.5 08/02/2021    MM 3D SCREEN BREAST BILATERAL  Result Date: 03/19/2022 CLINICAL DATA:  Screening. EXAM: DIGITAL SCREENING BILATERAL MAMMOGRAM WITH TOMOSYNTHESIS AND CAD TECHNIQUE: Bilateral screening digital craniocaudal and mediolateral oblique mammograms were obtained. Bilateral screening digital breast tomosynthesis was performed. The images were evaluated with computer-aided detection. COMPARISON:  Previous exam(s). ACR Breast Density Category b: There are scattered areas of fibroglandular density. FINDINGS: There are no findings suspicious for malignancy. IMPRESSION: No mammographic evidence of malignancy. A result letter of this screening mammogram will be mailed directly to the patient. RECOMMENDATION: Screening mammogram in one year. (Code:[id number]) BI-RADS CATEGORY  1: Negative. Electronically Signed   By: Everlean Alstrom M.D.   On: 03/19/2022 13:37       Assessment & Plan:   Problem List Items Addressed This Visit     COPD (chronic obstructive pulmonary disease) ([place])    Followed by Dr Mortimer Fries - abnormal CT RUL nodule - f/u CT scan in 3 months.  (Last seen 03/19/22).  Recommended continuing inhaled corticosteroids and LABA. Breathing stable.       GERD (gastroesophageal reflux disease)    Upper symptoms controlled on current PPI.        Health care maintenance  Physical today 05/28/22.  Mammogram 03/19/22 - Birads I.  Colonoscopy 06/17/21 -  One 4 mm polyp in the cecum. One 12 mm polyp in the descending colon. Non-bleeding external hemorrhoids. Continue prolia.       History of colonic polyps    Colonoscopy 06/2021.   Recommended f/u in 3 years.       History of CVA (cerebrovascular accident)    Found to have acute corpus callosum infarct.  On plavix and aspirin.  Symptoms improved.  Seeing Dr Manuella Ghazi. Continue aspirin daily and pravastatin.       Hypercholesterolemia    On pravastatin.  Low cholesterol diet and exercise. Follow lipid panel and liver function tests.        Relevant Orders   CBC with Differential/Platelet   Basic metabolic panel   Hepatic function panel   Lipid panel   Low back pain    [facility] 05/26/22 - medrol dosepak and MR.  PT.       Lung nodules    Dr Mortimer Fries f/u 03/19/22 -  Abnormal CT chest right upper lobe nodule Follow-up CT scan in 3 months Patient is at a high risk for malignancy due to extensive smoking history       Stress    Overall appears to be handling things well.  Follow.       Thyroid nodule    Saw endocrinology.  Recommended f/u ultrasound and TSH in 12 months.        Relevant Orders   TSH   Other Visit Diagnoses     Routine general medical examination at a health care facility    -  Primary   Need for influenza vaccination       Relevant Orders   Flu Vaccine QUAD High Dose(Fluad) (Completed)        Einar Pheasant, MD

## 2022-05-28 NOTE — Assessment & Plan Note (Addendum)
Physical today 05/28/22.  Mammogram 03/19/22 - Birads I.  Colonoscopy 06/17/21 -  One 4 mm polyp in the cecum. One 12 mm polyp in the descending colon. Non-bleeding external hemorrhoids. Continue prolia.

## 2022-06-08 ENCOUNTER — Encounter: Payer: Self-pay | Admitting: Internal Medicine

## 2022-06-08 NOTE — Assessment & Plan Note (Signed)
Saw endocrinology.  Recommended f/u ultrasound and TSH in 12 months.

## 2022-06-08 NOTE — Assessment & Plan Note (Signed)
[facility] 05/26/22 - medrol dosepak and MR.  PT.

## 2022-06-08 NOTE — Assessment & Plan Note (Signed)
Followed by Dr Mortimer Fries - abnormal CT RUL nodule - f/u CT scan in 3 months.  (Last seen 03/19/22).  Recommended continuing inhaled corticosteroids and LABA. Breathing stable.

## 2022-06-08 NOTE — Assessment & Plan Note (Signed)
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  On plavix and aspirin.  Symptoms improved.  Seeing Dr Manuella Ghazi. Continue aspirin daily and pravastatin.

## 2022-06-08 NOTE — Assessment & Plan Note (Signed)
Colonoscopy 06/2021.  Recommended f/u in 3 years.

## 2022-06-08 NOTE — Assessment & Plan Note (Signed)
Upper symptoms controlled on current PPI.

## 2022-06-08 NOTE — Assessment & Plan Note (Signed)
Dr Mortimer Fries f/u 03/19/22 -  Abnormal CT chest right upper lobe nodule Follow-up CT scan in 3 months Patient is at a high risk for malignancy due to extensive smoking history

## 2022-06-08 NOTE — Assessment & Plan Note (Signed)
On pravastatin.  Low cholesterol diet and exercise.  Follow lipid panel and liver function tests.   

## 2022-06-08 NOTE — Assessment & Plan Note (Signed)
Overall appears to be handling things well.  Follow.  ?

## 2022-06-09 ENCOUNTER — Ambulatory Visit
Admission: RE | Admit: 2022-06-09 | Discharge: 2022-06-09 | Disposition: A | Discharge status: Home / Self Care | Payer: Medicare HMO | Source: Ambulatory Visit | Attending: Internal Medicine | Admitting: Internal Medicine

## 2022-06-09 DIAGNOSIS — R911 Solitary pulmonary nodule: Secondary | ICD-10-CM | POA: Insufficient documentation

## 2022-06-10 ENCOUNTER — Telehealth: Payer: Self-pay | Admitting: Internal Medicine

## 2022-06-10 NOTE — Telephone Encounter (Signed)
Noted.   Will route to Dr. Patsey Berthold as an Juluis Rainier.

## 2022-06-10 NOTE — Telephone Encounter (Signed)
Lm for patient to schedule with Dr. Patsey Berthold.

## 2022-06-10 NOTE — Telephone Encounter (Signed)
Received call report from [place] with [facility] on patient's CT done on 06/09/22. Dr.Kasa, please review the result/impression copied below:  IMPRESSION: 1. Increased size of the bilobed nodule in the anterior right upper lobe now measuring 8 x 5 mm. This is concerning for malignancy. Consider pulmonary consult and/or tissue sampling. 2. New 4 mm nodule in the right middle lobe. 3. Superior endplate compression fractures of T10, T11, and L1 are new since 03/07/2022. There is 6 mm of retropulsion of the superior endplate of L1 with mild to moderate spinal canal narrowing at this level. 4. Aortic Atherosclerosis (ICD10-I70.0) and Emphysema (ICD10-J43.9).  Please advise, thank you.

## 2022-06-12 ENCOUNTER — Telehealth: Payer: Self-pay | Admitting: Pulmonary Disease

## 2022-06-12 NOTE — Telephone Encounter (Signed)
Patient is returning a call to the nurse.  She missed the call and would like the nurse to call back at [phone]

## 2022-06-12 NOTE — Telephone Encounter (Signed)
Please see 06/10/2022 phone note. Patient has been scheduled for appt. She is aware that it was an old message that was left. Nothing further needed.

## 2022-06-13 ENCOUNTER — Other Ambulatory Visit: Payer: Self-pay | Admitting: Internal Medicine

## 2022-06-13 DIAGNOSIS — J449 Chronic obstructive pulmonary disease, unspecified: Secondary | ICD-10-CM

## 2022-06-17 ENCOUNTER — Encounter: Payer: Self-pay | Admitting: Pulmonary Disease

## 2022-06-17 ENCOUNTER — Ambulatory Visit: Payer: Medicare HMO | Admitting: Pulmonary Disease

## 2022-06-17 VITALS — BP 128/84 | HR 92 | Temp 97.6°F | Ht 62.0 in | Wt 185.4 lb

## 2022-06-17 DIAGNOSIS — J449 Chronic obstructive pulmonary disease, unspecified: Secondary | ICD-10-CM | POA: Diagnosis not present

## 2022-06-17 DIAGNOSIS — R911 Solitary pulmonary nodule: Secondary | ICD-10-CM

## 2022-06-17 NOTE — Patient Instructions (Addendum)
We will see you in follow-up in 6 weeks time. Hopefully, by that time your back issues will be resolved.

## 2022-06-17 NOTE — Progress Notes (Signed)
Subjective:    Patient ID: Alexis Reed, female    DOB: Jul 27, 1945, 76 y.o.   MRN: [id number] Patient Care Team: Dale [place], MD as PCP - General (Internal Medicine) Lemar Livings, Merrily Pew, MD (General Surgery) Dale Pritchett, MD (Internal Medicine)  Chief Complaint  Patient presents with   Follow-up    Lung nodule. No SOB, wheezing or cough.   HPI The patient is a 76 year old former smoker 30-pack-year history of smoking and a history as noted below, who presents for evaluation of potential robotic assisted bronchoscopy for the issue of a right upper lobe lung nodule.  Patient is usually followed by Dr. Santiago Glad.  She has been monitored for a right upper lobe nodule that is 8 mm in diameter and appears to be bilobed.  The patient presents for discussion of this issue.  She is having significant back issues and wants to postpone further workup.  Last CT chest was performed 09 June 2022.  The patient has significant pulmonary emphysema and the lung nodule in question is surrounded by areas of emphysema and close to the pleural surface.  She would be a high risk for pneumothorax with biopsy procedure.  The nodule is indeterminate for potential PET/CT imaging to size.  The patient at present would like to continue observational protocol.  She continues to have some issues with her back that need to be addressed.  She does not endorse any fevers, chills or sweats.  No chest pain, shortness of breath at baseline, dry nonproductive cough in the morning self-limiting.  No hemoptysis.  No weight loss or anorexia.  Does not endorse any other symptomatology.   She last saw Dr. Belia Heman on 19 March 2022.  Per Dr. Clovis Fredrickson note of that date he had noted that her October 2020 chest CTs showed right upper lobe nodular opacities with tree-in-bud pattern.  Subsequently on August 2023 a right upper lobe nodule was first noted in this area.  The November study was a follow-up to that August CT.  Review of  Systems A 10 point review of systems was performed and it is as noted above otherwise negative.  Patient Active Problem List   Diagnosis Date Noted   Compression fracture of T12 vertebra (HCC) 08/26/2022   Low back pain 05/27/2022   Anemia 11/02/2021   History of total knee arthroplasty 09/15/2021   B12 deficiency 09/15/2021   History of CVA (cerebrovascular accident) 08/18/2021   Edema of right lower leg 08/02/2021   Acute CVA (cerebrovascular accident) (HCC) 08/02/2021   AMS (altered mental status) 08/01/2021   Pre-op evaluation 07/24/2021   Lung nodules 07/24/2021   Dyspepsia    Burping    Polyp of cecum    Polyp of ascending colon    Vitamin D deficiency 05/11/2021   Increased heart rate 05/10/2021   Breast cancer screening 01/07/2021   Ear anomaly 07/18/2020   Complex tear of medial meniscus of right knee as current injury 06/22/2020   Breast nodule 06/10/2020   Thyroid nodule 05/29/2020   Asymmetry of clavicles 05/07/2020   Complex tear of lateral meniscus of right knee as current injury 04/30/2020   Primary osteoarthritis of right knee 04/30/2020   Leg wound, right 01/16/2020   Laceration of skin of knee 01/10/2020   Palpitations 10/10/2019   Breast pain 10/10/2019   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   Side pain 08/22/2018   History of colonic polyps 02/04/2016   Personal history of tobacco use, presenting hazards to health 08/07/2015  Health care maintenance 11/18/2014   Breast calcifications on mammogram 10/18/2014   Obesity 07/23/2014   COPD (chronic obstructive pulmonary disease) (HCC) 04/26/2014   Tobacco use disorder 04/26/2014   Abnormal mammogram 04/12/2014   UTI (urinary tract infection) 04/02/2014   SOB (shortness of breath) 02/25/2014   Stress 02/25/2014   Internal hemorrhoids without complication 09/24/2013   Osteopenia 06/18/2012   GERD (gastroesophageal reflux disease) 06/18/2012   Hypercholesterolemia 06/18/2012   Allergies  Allergen Reactions    Benzoin Compound Other (See Comments)    After breast surgery After breast surgery   Boniva [Ibandronic Acid] Other (See Comments)    Aching    Codeine Nausea Only   Fosamax [Alendronate Sodium]     Caused aching   Lipitor [Atorvastatin] Hives   Meloxicam Other (See Comments)    GI Upset   Penicillins Swelling    Facial swelling Did it involve swelling of the face/tongue/throat, SOB, or low BP? Yes Did it involve sudden or severe rash/hives, skin peeling, or any reaction on the inside of your mouth or nose? No Did you need to seek medical attention at a hospital or doctor's office? No When did it last happen?      within the last 10 years If all above answers are "NO", may proceed with cephalosporin use.    Social History   Tobacco Use   Smoking status: Former    Packs/day: 1.00    Years: 30.00    Additional pack years: 0.00    Total pack years: 30.00    Types: Cigarettes    Quit date: 04/26/2004    Years since quitting: 18.7    Passive exposure: Past   Smokeless tobacco: Never  Substance Use Topics   Alcohol use: Yes    Alcohol/week: 0.0 standard drinks of alcohol    Comment: occassional wine every other day   Current Meds  Medication Sig   acetaminophen (TYLENOL) 500 MG tablet Take 1,000 mg by mouth every 6 (six) hours as needed for moderate pain.   ADVAIR HFA 230-21 MCG/ACT inhaler INHALE 2 PUFFS BY MOUTH TWICE DAILY   aspirin EC 81 MG EC tablet Take 1 tablet (81 mg total) by mouth daily. Swallow whole.   Calcium Carb-Cholecalciferol (CALCIUM 500 +D) 500-400 MG-UNIT TABS Take 1 tablet by mouth daily.   Cholecalciferol (VITAMIN D) 2000 UNITS tablet Take 2,000 Units by mouth daily.   COMBIVENT RESPIMAT 20-100 MCG/ACT AERS respimat INHALE 1 PUF BY MOUTH EVERY 6 HOURS AS NEEDED WHEEZING   Cranberry 450 MG CAPS Take 450 mg by mouth daily.   cyclobenzaprine (FLEXERIL) 5 MG tablet Take 5 mg by mouth as needed.   ibuprofen (ADVIL) 800 MG tablet Take 800 mg by mouth 3  (three) times daily.   methocarbamol (ROBAXIN) 500 MG tablet Take 1 tablet by mouth every 6 (six) hours as needed.   omeprazole (PRILOSEC) 20 MG capsule TAKE 1 CAPSULE BY MOUTH ONCE DAILY   pravastatin (PRAVACHOL) 80 MG tablet Take 1 tablet (80 mg total) by mouth daily.   Immunization History  Administered Date(s) Administered   Fluad Quad(high Dose 65+) 04/13/2020, 05/10/2021, 05/28/2022   Influenza Split 05/12/2012, 04/11/2013   Influenza, High Dose Seasonal PF 04/28/2017, 05/10/2018   Influenza-Unspecified 05/18/2014, 05/15/2015   PFIZER Comirnaty(Gray Top)Covid-19 Tri-Sucrose Vaccine 08/05/2019, 08/26/2019, 04/27/2020, 05/07/2021   PFIZER(Purple Top)SARS-COV-2 Vaccination 08/05/2019, 08/26/2019, 04/27/2020   Pneumococcal Conjugate-13 07/29/2017   Pneumococcal Polysaccharide-23 04/26/2014   Zoster, Live 06/13/2013       Objective:  Physical Exam BP 128/84 (BP Location: Left Arm, Cuff Size: Normal)   Pulse 92   Temp 97.6 F (36.4 C)   Ht 5\' 2"  (1.575 m)   Wt 185 lb 6.4 oz (84.1 kg)   LMP 07/20/1995   SpO2 92%   BMI 33.91 kg/m   SpO2: 92 % O2 Device: None (Room air)  GENERAL: Well-developed, well-nourished woman, no acute distress, fully ambulatory, no conversational dyspnea. HEAD: Normocephalic, atraumatic.  EYES: Pupils equal, round, reactive to light.  No scleral icterus.  MOUTH: Dentition intact, no thrush. NECK: Supple. No thyromegaly. Trachea midline. No JVD.  No adenopathy. PULMONARY: Good air entry bilaterally.  Coarse, otherwise, no adventitious sounds. CARDIOVASCULAR: S1 and S2. Regular rate and rhythm.  No rubs, murmurs or gallops heard. ABDOMEN: Benign. MUSCULOSKELETAL: No joint deformity, no clubbing, no edema.  NEUROLOGIC: No overt focal deficit, no gait disturbance, speech is fluent. SKIN: Intact,warm,dry. PSYCH: Mood and behavior normal  Representative image from CT performed 09 June 2022 showing the nodule on the anterior right upper lobe  measuring 8 x 5 mm:      Assessment & Plan:     ICD-10-CM   1. Lung nodule  R91.1    Patient continues to prefer observational protocol Open 6 weeks time    2. Chronic obstructive pulmonary disease, unspecified COPD type (HCC)  J44.9    Continue current regimen On Advair twice a day On Combivent as needed Notes this helps     Patient is currently having issues with her back that need to be addressed.  She would like to postpone further workup of the nodule and continue observational protocol.  We will reconvene in 6 weeks time and reassess her at that time.  Gailen Shelter, MD Advanced Bronchoscopy PCCM [facility]    *This note was dictated using voice recognition software/Dragon.  Despite best efforts to proofread, errors can occur which can change the meaning. Any transcriptional errors that result from this process are unintentional and may not be fully corrected at the time of dictation.

## 2022-06-23 ENCOUNTER — Ambulatory Visit: Payer: Medicare HMO | Admitting: Internal Medicine

## 2022-07-16 ENCOUNTER — Encounter: Payer: Self-pay | Admitting: Internal Medicine

## 2022-07-16 DIAGNOSIS — M810 Age-related osteoporosis without current pathological fracture: Secondary | ICD-10-CM

## 2022-07-17 NOTE — Telephone Encounter (Signed)
Please notify pt will need labs (and results)  prior to receiving prolia.  Please schedule lab appt.  I have added vitamin D.  Will need vitamin D and all of the other fasting labs.

## 2022-07-17 NOTE — Telephone Encounter (Signed)
Any patient starting or restarting Prolia have to have a Vit D & Calcium checked within the last 6 months.  She will need a Vit D lab prior to restarting. Last Vit D check was in 2022.

## 2022-07-21 ENCOUNTER — Encounter: Payer: Self-pay | Admitting: Internal Medicine

## 2022-07-21 NOTE — Telephone Encounter (Signed)
Pt advised need vitamin d level to know if can start prolia. Appt moved up in order to get results sooner.

## 2022-07-21 NOTE — Telephone Encounter (Signed)
LM FOR PT TO CB - NEED TO MOVE UP LAB APPT

## 2022-07-23 ENCOUNTER — Other Ambulatory Visit (INDEPENDENT_AMBULATORY_CARE_PROVIDER_SITE_OTHER): Payer: Medicare HMO

## 2022-07-23 DIAGNOSIS — M810 Age-related osteoporosis without current pathological fracture: Secondary | ICD-10-CM | POA: Diagnosis not present

## 2022-07-23 DIAGNOSIS — E7800 Pure hypercholesterolemia, unspecified: Secondary | ICD-10-CM | POA: Diagnosis not present

## 2022-07-23 DIAGNOSIS — E041 Nontoxic single thyroid nodule: Secondary | ICD-10-CM

## 2022-07-23 LAB — CBC WITH DIFFERENTIAL/PLATELET
Basophils Absolute: 0 10*3/uL (ref 0.0–0.1)
Basophils Relative: 0.8 % (ref 0.0–3.0)
Eosinophils Absolute: 0.1 10*3/uL (ref 0.0–0.7)
Eosinophils Relative: 2.6 % (ref 0.0–5.0)
HCT: 42 % (ref 36.0–46.0)
Hemoglobin: 14.1 g/dL (ref 12.0–15.0)
Lymphocytes Relative: 21.1 % (ref 12.0–46.0)
Lymphs Abs: 1.1 10*3/uL (ref 0.7–4.0)
MCHC: 33.5 g/dL (ref 30.0–36.0)
MCV: 93.4 fl (ref 78.0–100.0)
Monocytes Absolute: 0.6 10*3/uL (ref 0.1–1.0)
Monocytes Relative: 10.7 % (ref 3.0–12.0)
Neutro Abs: 3.5 10*3/uL (ref 1.4–7.7)
Neutrophils Relative %: 64.8 % (ref 43.0–77.0)
Platelets: 308 10*3/uL (ref 150.0–400.0)
RBC: 4.5 Mil/uL (ref 3.87–5.11)
RDW: 14.4 % (ref 11.5–15.5)
WBC: 5.4 10*3/uL (ref 4.0–10.5)

## 2022-07-23 LAB — LIPID PANEL
Cholesterol: 157 mg/dL (ref 0–200)
HDL: 61.7 mg/dL (ref >39.00)
LDL Cholesterol: 74 mg/dL (ref 0–99)
NonHDL: 95.67
Total CHOL/HDL Ratio: 3
Triglycerides: 110 mg/dL (ref 0.0–149.0)
VLDL: 22 mg/dL (ref 0.0–40.0)

## 2022-07-23 LAB — BASIC METABOLIC PANEL
BUN: 20 mg/dL (ref 6–23)
CO2: 28 mEq/L (ref 19–32)
Calcium: 10 mg/dL (ref 8.4–10.5)
Chloride: 102 mEq/L (ref 96–112)
Creatinine, Ser: 0.74 mg/dL (ref 0.40–1.20)
GFR: 79.72 mL/min (ref >60.00)
Glucose, Bld: 92 mg/dL (ref 70–99)
Potassium: 4.4 mEq/L (ref 3.5–5.1)
Sodium: 142 mEq/L (ref 135–145)

## 2022-07-23 LAB — VITAMIN D 25 HYDROXY (VIT D DEFICIENCY, FRACTURES): VITD: 29.31 ng/mL — ABNORMAL LOW (ref 30.00–100.00)

## 2022-07-23 LAB — HEPATIC FUNCTION PANEL
ALT: 15 U/L (ref 0–35)
AST: 15 U/L (ref 0–37)
Albumin: 4.4 g/dL (ref 3.5–5.2)
Alkaline Phosphatase: 138 U/L — ABNORMAL HIGH (ref 39–117)
Bilirubin, Direct: 0.1 mg/dL (ref 0.0–0.3)
Total Bilirubin: 0.7 mg/dL (ref 0.2–1.2)
Total Protein: 6.8 g/dL (ref 6.0–8.3)

## 2022-07-23 LAB — TSH: TSH: 1.73 u[IU]/mL (ref 0.35–5.50)

## 2022-07-28 ENCOUNTER — Other Ambulatory Visit: Payer: Medicare HMO

## 2022-07-29 ENCOUNTER — Encounter: Payer: Self-pay | Admitting: Internal Medicine

## 2022-07-29 ENCOUNTER — Ambulatory Visit: Payer: Medicare HMO | Admitting: Internal Medicine

## 2022-07-29 VITALS — BP 122/74 | HR 94 | Temp 97.8°F | Resp 16 | Ht 62.0 in | Wt 180.0 lb

## 2022-07-29 DIAGNOSIS — D649 Anemia, unspecified: Secondary | ICD-10-CM | POA: Diagnosis not present

## 2022-07-29 DIAGNOSIS — E7800 Pure hypercholesterolemia, unspecified: Secondary | ICD-10-CM | POA: Diagnosis not present

## 2022-07-29 DIAGNOSIS — E538 Deficiency of other specified B group vitamins: Secondary | ICD-10-CM

## 2022-07-29 DIAGNOSIS — R109 Unspecified abdominal pain: Secondary | ICD-10-CM

## 2022-07-29 DIAGNOSIS — Z8673 Personal history of transient ischemic attack (TIA), and cerebral infarction without residual deficits: Secondary | ICD-10-CM

## 2022-07-29 DIAGNOSIS — J449 Chronic obstructive pulmonary disease, unspecified: Secondary | ICD-10-CM | POA: Diagnosis not present

## 2022-07-29 DIAGNOSIS — F439 Reaction to severe stress, unspecified: Secondary | ICD-10-CM

## 2022-07-29 DIAGNOSIS — Z86010 Personal history of colonic polyps: Secondary | ICD-10-CM

## 2022-07-29 DIAGNOSIS — K219 Gastro-esophageal reflux disease without esophagitis: Secondary | ICD-10-CM

## 2022-07-29 DIAGNOSIS — E041 Nontoxic single thyroid nodule: Secondary | ICD-10-CM

## 2022-07-29 DIAGNOSIS — R918 Other nonspecific abnormal finding of lung field: Secondary | ICD-10-CM

## 2022-07-29 DIAGNOSIS — M8580 Other specified disorders of bone density and structure, unspecified site: Secondary | ICD-10-CM

## 2022-07-29 MED ORDER — PRAVASTATIN SODIUM 80 MG PO TABS: 80.0000 mg | ORAL_TABLET | Freq: Every day | ORAL | 3 refills | Status: DC

## 2022-07-29 NOTE — Progress Notes (Addendum)
Subjective:    Patient ID: Alexis Reed, female    DOB: [birth year], 77 y.o.   MRN: [id number]  Patient here for  Chief Complaint  Patient presents with   Medical Management of Chronic Issues    HPI Here for f/u regarding history of CVA, sleep apnea and hypercholesterolemia.  Saw Dr Gabriel Carina 07/28/22 - f/u thyroid nodule.  Had thyroid ultrasound - stable.  Also f/u osteoporosis.  Recommended prolia.  Had questions regarding injection. Also noted lung nodule -  Increased size of the bilobed nodule in the anterior right upper lobe now measuring 8 x 5 mm. Recommended f/u with pulmonary. Has appt with Dr Patsey Berthold next week.  Back is better.  No chest pain reported.  No nausea or vomiting.  Left side pain - constant.  Feels bloated.  When sits - eases off.     Past Medical History:  Diagnosis Date   Arthritis    Benign breast lumps    multiple lumps biopsy and remove x's 5 last being 1979 by Dr. Sharlet Salina   COPD (chronic obstructive pulmonary disease) ([place])    MILD-RARELY EVER HAS TO USE COMBIVENT INHALER   Fibroids    s/p hysterectomy   GERD (gastroesophageal reflux disease)    Hypercholesterolemia    Osteoporosis    Personal history of tobacco use, presenting hazards to health 08/07/2015   Urinary tract infection    Past Surgical History:  Procedure Laterality Date   ABDOMINAL HYSTERECTOMY  1997   with bilateral oophorectomy   BREAST BIOPSY Left    multiple done   BREAST BIOPSY Right 2016   stereo- neg. FC changes   BREAST EXCISIONAL BIOPSY Right 1975   multiple biopsies done   BREAST SURGERY Left    lump removed. Unsure of date   BREAST SURGERY Right    lump removed. Unsure of date   COLONOSCOPY  2010   Dr. Vira Agar   COLONOSCOPY WITH PROPOFOL N/A 06/17/2021   Procedure: COLONOSCOPY WITH PROPOFOL;  Surgeon: Lin Landsman, MD;  Location: [facility] ENDOSCOPY;  Service: Gastroenterology;  Laterality: N/A;   ESOPHAGOGASTRODUODENOSCOPY (EGD) WITH PROPOFOL  06/17/2021    Procedure: ESOPHAGOGASTRODUODENOSCOPY (EGD) WITH PROPOFOL;  Surgeon: Lin Landsman, MD;  Location: [facility] ENDOSCOPY;  Service: Gastroenterology;;   KNEE ARTHROSCOPY WITH LATERAL MENISECTOMY Right 05/08/2020   Procedure: RIGHT KNEE ARTHROSCOPY WITH DEBRIDEMENT AND PARTIAL LATERAL MENISCECTOMY;  Surgeon: Corky Mull, MD;  Location: [facility] ORS;  Service: Orthopedics;  Laterality: Right;   LAPAROSCOPIC APPENDECTOMY N/A 12/21/2018   Procedure: APPENDECTOMY LAPAROSCOPIC CONVERTED TO OPEN;  Surgeon: Benjamine Sprague, DO;  Location: [facility] ORS;  Service: General;  Laterality: N/A;   TONSILLECTOMY     Family History  Problem Relation Age of Onset   Hyperlipidemia Mother    Thyroid disease Mother    Hypertension Mother    Urinary tract infection Mother    Colon cancer Father    Stroke Father    Heart disease Father        myocardial infarction   Breast cancer Neg Hx    Social History   Socioeconomic History   Marital status: Married    Spouse name: Not on file   Number of children: 2   Years of education: Not on file   Highest education level: Not on file  Occupational History   Not on file  Tobacco Use   Smoking status: Former    Packs/day: 1.00    Years: 30.00    Total pack years: 30.00  Types: Cigarettes    Quit date: 04/26/2004    Years since quitting: 18.2    Passive exposure: Past   Smokeless tobacco: Never  Vaping Use   Vaping Use: Never used  Substance and Sexual Activity   Alcohol use: Yes    Alcohol/week: 0.0 standard drinks of alcohol    Comment: occassional wine every other day   Drug use: No   Sexual activity: Yes    Birth control/protection: Post-menopausal  Other Topics Concern   Not on file  Social History Narrative   She is married, has two children. Works at [employer] Strain: Not on file  Food Insecurity: Not on file  Transportation Needs: Not on file  Physical Activity: Not  on file  Stress: Not on file  Social Connections: Not on file     Review of Systems  Constitutional:  Negative for appetite change and unexpected weight change.  HENT:  Negative for congestion and sinus pressure.   Respiratory:  Negative for cough, chest tightness and shortness of breath.   Cardiovascular:  Negative for chest pain, palpitations and leg swelling.  Gastrointestinal:  Negative for abdominal pain, diarrhea, nausea and vomiting.  Genitourinary:  Negative for difficulty urinating and dysuria.  Musculoskeletal:  Negative for joint swelling and myalgias.       Back is better.   Skin:  Negative for color change and rash.  Neurological:  Negative for dizziness and headaches.  Psychiatric/Behavioral:  Negative for agitation and dysphoric mood.        Objective:     BP 122/74 (BP Location: Left Arm, Patient Position: Sitting, Cuff Size: Normal)   Pulse 94   Temp 97.8 F (36.6 C) (Oral)   Resp 16   Ht '5\' 2"'$  (1.575 m)   Wt 180 lb (81.6 kg)   LMP 07/20/1995   SpO2 97%   BMI 32.92 kg/m  Wt Readings from Last 3 Encounters:  08/05/22 181 lb 6.4 oz (82.3 kg)  07/29/22 180 lb (81.6 kg)  06/17/22 185 lb 6.4 oz (84.1 kg)    Physical Exam Vitals reviewed.  Constitutional:      General: She is not in acute distress.    Appearance: Normal appearance.  HENT:     Head: Normocephalic and atraumatic.     Right Ear: External ear normal.     Left Ear: External ear normal.  Eyes:     General: No scleral icterus.       Right eye: No discharge.        Left eye: No discharge.     Conjunctiva/sclera: Conjunctivae normal.  Neck:     Thyroid: No thyromegaly.  Cardiovascular:     Rate and Rhythm: Normal rate and regular rhythm.  Pulmonary:     Effort: No respiratory distress.     Breath sounds: Normal breath sounds. No wheezing.  Abdominal:     General: Bowel sounds are normal.     Palpations: Abdomen is soft.     Tenderness: There is no abdominal tenderness.   Musculoskeletal:        General: No swelling or tenderness.     Cervical back: Neck supple. No tenderness.  Lymphadenopathy:     Cervical: No cervical adenopathy.  Skin:    Findings: No erythema or rash.  Neurological:     Mental Status: She is alert.  Psychiatric:        Mood and Affect: Mood normal.  Behavior: Behavior normal.      Outpatient Encounter Medications as of 07/29/2022  Medication Sig   acetaminophen (TYLENOL) 500 MG tablet Take 1,000 mg by mouth every 6 (six) hours as needed for moderate pain.   ADVAIR HFA 230-21 MCG/ACT inhaler INHALE 2 PUFFS BY MOUTH TWICE DAILY   aspirin EC 81 MG EC tablet Take 1 tablet (81 mg total) by mouth daily. Swallow whole.   Calcium Carb-Cholecalciferol (CALCIUM 500 +D) 500-400 MG-UNIT TABS Take 1 tablet by mouth daily.   Cholecalciferol (VITAMIN D) 2000 UNITS tablet Take 2,000 Units by mouth daily.   COMBIVENT RESPIMAT 20-100 MCG/ACT AERS respimat INHALE 1 PUF BY MOUTH EVERY 6 HOURS AS NEEDED WHEEZING   Cranberry 450 MG CAPS Take 450 mg by mouth daily.   cyclobenzaprine (FLEXERIL) 5 MG tablet Take 5 mg by mouth as needed.   ibuprofen (ADVIL) 800 MG tablet Take 800 mg by mouth 3 (three) times daily.   methocarbamol (ROBAXIN) 500 MG tablet Take 1 tablet by mouth every 6 (six) hours as needed.   omeprazole (PRILOSEC) 20 MG capsule TAKE 1 CAPSULE BY MOUTH ONCE DAILY   pravastatin (PRAVACHOL) 80 MG tablet Take 1 tablet (80 mg total) by mouth daily.   [DISCONTINUED] pravastatin (PRAVACHOL) 80 MG tablet Take 1 tablet (80 mg total) by mouth daily.   No facility-administered encounter medications on file as of 07/29/2022.     Lab Results  Component Value Date   WBC 5.4 07/23/2022   HGB 14.1 07/23/2022   HCT 42.0 07/23/2022   PLT 308.0 07/23/2022   GLUCOSE 92 07/23/2022   CHOL 157 07/23/2022   TRIG 110.0 07/23/2022   HDL 61.70 07/23/2022   LDLDIRECT 153.0 07/17/2015   LDLCALC 74 07/23/2022   ALT 15 07/23/2022   AST 15 07/23/2022    NA 142 07/23/2022   K 4.4 07/23/2022   CL 102 07/23/2022   CREATININE 0.74 07/23/2022   BUN 20 07/23/2022   CO2 28 07/23/2022   TSH 1.73 07/23/2022   INR 0.9 07/23/2021   HGBA1C 5.5 08/02/2021    CT CHEST WO CONTRAST  Result Date: 06/10/2022 CLINICAL DATA:  Follow-up lung nodule EXAM: CT CHEST WITHOUT CONTRAST TECHNIQUE: Multidetector CT imaging of the chest was performed following the standard protocol without IV contrast. RADIATION DOSE REDUCTION: This exam was performed according to the departmental dose-optimization program which includes automated exposure control, adjustment of the mA and/or kV according to patient size and/or use of iterative reconstruction technique. COMPARISON:  CT 03/07/2022 and priors FINDINGS: Cardiovascular: Coronary artery atherosclerosis. Aortic calcification. Normal heart size. Trace pericardial effusion. Mediastinum/Nodes: No thoracic adenopathy by size. Normal thyroid. Unremarkable esophagus. Lungs/Pleura: Diffuse predominantly centrilobular emphysema. Biapical pleural-parenchymal scarring. Lingular scarring/atelectasis. Clustered micro nodularity in the lateral right upper lobe. Likely infectious/inflammatory. No pleural effusion or pneumothorax. Bilobed nodule in the anterior right upper lobe has continued to increase in size now measuring 8 x 5 mm (3/42). There is a new nodule in the right middle lobe along the minor fissure measuring 4 mm (3/107)., previously 7 x 4 mm. Upper Abdomen: No acute abnormality. Musculoskeletal: Superior endplate compression fractures of T10, T11, and L1 are new since 03/07/2022. There is 2-3 mm of posterior retropulsion of the superior endplates of O67 and T24 and approximately 6 mm of retropulsion of the superior endplate of L1. This causes mild to moderate narrowing of the spinal canal at this level. IMPRESSION: 1. Increased size of the bilobed nodule in the anterior right upper lobe now measuring  8 x 5 mm. This is concerning for  malignancy. Consider pulmonary consult and/or tissue sampling. 2. New 4 mm nodule in the right middle lobe. 3. Superior endplate compression fractures of T10, T11, and L1 are new since 03/07/2022. There is 6 mm of retropulsion of the superior endplate of L1 with mild to moderate spinal canal narrowing at this level. 4. Aortic Atherosclerosis (ICD10-I70.0) and Emphysema (ICD10-J43.9). These results will be called to the ordering clinician or representative by the Radiologist Assistant, and communication documented in the PACS or [employer]. Electronically Signed   By: Placido Sou M.D.   On: 06/10/2022 00:42       Assessment & Plan:  Hypercholesterolemia Assessment & Plan: On pravastatin.  Low cholesterol diet and exercise. Follow lipid panel and liver function tests.    Orders: -     Basic metabolic panel; Future -     Lipid panel; Future -     Hepatic function panel; Future  Anemia, unspecified type Assessment & Plan: Follow cbc.    B12 deficiency Assessment & Plan: Oral B12.    Chronic obstructive pulmonary disease, unspecified COPD type ([place]) Assessment & Plan: Followed by Dr Mortimer Fries - abnormal CT RUL nodule - f/u CT scan in 3 months.  Evaluated 03/2022.  Had f/u CT 06/09/22 - Lung nodule -  Increased size of the bilobed nodule in the anterior right upper lobe now measuring 8 x 5 mm. Recommended f/u with pulmonary. Has appt with Dr Patsey Berthold next week.  Recommended continuing inhaled corticosteroids and LABA. Breathing stable.    Gastroesophageal reflux disease, unspecified whether esophagitis present Assessment & Plan: Continue prilosec.    History of colonic polyps Assessment & Plan: Colonoscopy 06/2021.  Recommended f/u in 3 years.    History of CVA (cerebrovascular accident) Assessment & Plan: Found to have acute corpus callosum infarct.  On plavix and aspirin.  Symptoms improved.  Seeing Dr Manuella Ghazi. Continue aspirin daily and pravastatin.    Lung  nodules Assessment & Plan: Dr Mortimer Fries f/u 03/19/22 -  Abnormal CT chest right upper lobe nodule Follow-up CT scan in 3 months Patient is at a high risk for malignancy due to extensive smoking history.  Just had f/u CT 05/2022.  Recommended f/u with pulmonary.  Has f/u next week with Dr Patsey Berthold.    Stress Assessment & Plan: Overall appears to be handling things well.  Follow.    Thyroid nodule Assessment & Plan: Saw endocrinology.  Recommended f/u ultrasound and TSH in 12 months.     Osteopenia, unspecified location Assessment & Plan: Recent bone density 10/2021 - osteopenia.  With compression fractures.  Saw Endocrinology.  Recommended prolia.  Discussed. Wants to start getting injections here.  Check vitamin d level and renal function. Check calcium level.    Side pain Assessment & Plan: Left side pain/abdominal pain as outlined. Some bloating.  Monitor for triggers.  Check CT - abdomen and pelvis give persistent.    Orders: -     CT ABDOMEN PELVIS W CONTRAST; Future  Other orders -     Pravastatin Sodium; Take 1 tablet (80 mg total) by mouth daily.  Dispense: 90 tablet; Refill: 3     Einar Pheasant, MD

## 2022-08-02 ENCOUNTER — Encounter: Payer: Self-pay | Admitting: Internal Medicine

## 2022-08-02 ENCOUNTER — Telehealth: Payer: Self-pay | Admitting: *Deleted

## 2022-08-02 ENCOUNTER — Other Ambulatory Visit: Payer: Self-pay | Admitting: *Deleted

## 2022-08-02 DIAGNOSIS — M810 Age-related osteoporosis without current pathological fracture: Secondary | ICD-10-CM

## 2022-08-02 NOTE — Assessment & Plan Note (Signed)
On pravastatin.  Low cholesterol diet and exercise.  Follow lipid panel and liver function tests.   

## 2022-08-02 NOTE — Telephone Encounter (Signed)
Pt will owe $30 for Prolia Injection; PA required. Will forward to prior auth team.  Also need a Vit D lab (order placed)

## 2022-08-02 NOTE — Assessment & Plan Note (Signed)
Continue prilosec

## 2022-08-02 NOTE — Assessment & Plan Note (Signed)
Colonoscopy 06/2021.  Recommended f/u in 3 years.

## 2022-08-02 NOTE — Assessment & Plan Note (Signed)
Oral B12.

## 2022-08-02 NOTE — Assessment & Plan Note (Signed)
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  On plavix and aspirin.  Symptoms improved.  Seeing Dr Manuella Ghazi. Continue aspirin daily and pravastatin.

## 2022-08-02 NOTE — Assessment & Plan Note (Signed)
Followed by Dr Mortimer Fries - abnormal CT RUL nodule - f/u CT scan in 3 months.  Evaluated 03/2022.  Had f/u CT 06/09/22 - Lung nodule -  Increased size of the bilobed nodule in the anterior right upper lobe now measuring 8 x 5 mm. Recommended f/u with pulmonary. Has appt with Dr Patsey Berthold next week.  Recommended continuing inhaled corticosteroids and LABA. Breathing stable.

## 2022-08-02 NOTE — Assessment & Plan Note (Signed)
Follow cbc.  

## 2022-08-02 NOTE — Assessment & Plan Note (Signed)
Dr Mortimer Fries f/u 03/19/22 -  Abnormal CT chest right upper lobe nodule Follow-up CT scan in 3 months Patient is at a high risk for malignancy due to extensive smoking history.  Just had f/u CT 05/2022.  Recommended f/u with pulmonary.  Has f/u next week with Dr Patsey Berthold.

## 2022-08-03 NOTE — Assessment & Plan Note (Signed)
Saw endocrinology.  Recommended f/u ultrasound and TSH in 12 months.

## 2022-08-03 NOTE — Assessment & Plan Note (Signed)
Overall appears to be handling things well.  Follow.  ?

## 2022-08-03 NOTE — Assessment & Plan Note (Addendum)
Left side pain/abdominal pain as outlined. Some bloating.  Monitor for triggers.  Check CT - abdomen and pelvis give persistent.

## 2022-08-03 NOTE — Assessment & Plan Note (Signed)
Recent bone density 10/2021 - osteopenia.  With compression fractures.  Saw Endocrinology.  Recommended prolia.  Discussed. Wants to start getting injections here.  Check vitamin d level and renal function. Check calcium level.

## 2022-08-04 ENCOUNTER — Other Ambulatory Visit (HOSPITAL_COMMUNITY): Payer: Self-pay

## 2022-08-04 ENCOUNTER — Encounter: Payer: Self-pay | Admitting: *Deleted

## 2022-08-04 ENCOUNTER — Encounter: Payer: Self-pay | Admitting: Internal Medicine

## 2022-08-04 NOTE — Telephone Encounter (Signed)
Submitted PA through [employer]. No PA required

## 2022-08-04 NOTE — Telephone Encounter (Signed)
Left voicemail to return call & sent mychart.

## 2022-08-05 ENCOUNTER — Encounter: Payer: Self-pay | Admitting: Pulmonary Disease

## 2022-08-05 ENCOUNTER — Ambulatory Visit: Payer: Medicare HMO | Admitting: Pulmonary Disease

## 2022-08-05 VITALS — BP 132/86 | HR 82 | Temp 97.6°F | Ht 62.0 in | Wt 181.4 lb

## 2022-08-05 DIAGNOSIS — J449 Chronic obstructive pulmonary disease, unspecified: Secondary | ICD-10-CM

## 2022-08-05 DIAGNOSIS — R911 Solitary pulmonary nodule: Secondary | ICD-10-CM

## 2022-08-05 MED ORDER — TRELEGY ELLIPTA 100-62.5-25 MCG/ACT IN AEPB: 1.0000 | INHALATION_SPRAY | Freq: Every day | RESPIRATORY_TRACT | 0 refills | Status: DC

## 2022-08-05 NOTE — Addendum Note (Signed)
Addended by: Alisa Graff on: 08/05/2022 12:26 PM   Modules accepted: Orders

## 2022-08-05 NOTE — Telephone Encounter (Signed)
Pt advised - no allergies

## 2022-08-05 NOTE — Telephone Encounter (Signed)
Are they're any ofc notes to back up the need for the CT pt ins is going to want ofc notes. Please advise and Thank you!

## 2022-08-05 NOTE — Patient Instructions (Addendum)
You are getting some breathing tests to reestablish a baseline.  We are repeating a chest CT in 3 months time.  We have performed today a blood test that will tell us more about the possibility of lung cancer in your nodule.  We provided you with samples of Trelegy 100, this is 1 puff daily.  Please let us know how this inhaler does for you.  Do not take the Advair while on the Trelegy.  We will see you in follow-up in 3 months time AFTER YOUR CT CHEST.

## 2022-08-05 NOTE — Telephone Encounter (Signed)
Please call her and let her know that I have ordered the CT scan  someone should be contacting her with an appt date and time.  Also, confirm with her no allergy to contrast dye, shellfish or iodine.

## 2022-08-05 NOTE — Progress Notes (Signed)
Subjective:    Patient ID: Alexis Reed, female    DOB: [birth year], 77 y.o.   MRN: [id number] Patient Care Team: Einar Pheasant, MD as PCP - General (Internal Medicine) Bary Castilla Forest Gleason, MD (General Surgery) Einar Pheasant, MD (Internal Medicine)  Chief Complaint  Patient presents with   Follow-up    Discuss CT done 06/09/22. Breathing is overall doing well and denies any new complaints.    HPI The patient is a 77 year old former smoker 30-pack-year history of smoking and a history as noted below, who presents for follow-up on the issue of a right upper lobe lung nodule.  Patient is usually followed by Dr. Patricia Pesa.  She has been monitored for a right upper lobe nodule that is 8 mm in diameter and appears to be bilobed.  First evaluated the patient on 17 June 2022 for this nodule.  At that time however the patient was having significant back issues and wanted to postpone further workup.  We therefore opted to reconvene today.  Last CT chest was performed 09 June 2022.  The patient has significant pulmonary emphysema and the lung nodule in question is surrounded by areas of emphysema and close to the pleural surface.  The nodule is indeterminate for potential PET/CT imaging.  The patient at present would like to continue observational protocol.  She continues to have some issues with her back that need to be addressed.  She has been maintained on Advair for her COPD however is having difficulties due to insurance coverage, also it appears that the medication is not as effective as it used to be previously.  She does not endorse any fevers, chills or sweats.  No chest pain, shortness of breath at baseline, dry nonproductive cough in the morning self-limiting.  No hemoptysis.  No weight loss or anorexia.  Does not endorse any other symptomatology.  Review of Systems A 10 point review of systems was performed and it is as noted above otherwise negative.  Past Medical History:   Diagnosis Date   Arthritis    Benign breast lumps    multiple lumps biopsy and remove x's 5 last being 1979 by Dr. Sharlet Salina   COPD (chronic obstructive pulmonary disease) ([place])    MILD-RARELY EVER HAS TO USE COMBIVENT INHALER   Fibroids    s/p hysterectomy   GERD (gastroesophageal reflux disease)    Hypercholesterolemia    Osteoporosis    Personal history of tobacco use, presenting hazards to health 08/07/2015   Urinary tract infection    Past Surgical History:  Procedure Laterality Date   ABDOMINAL HYSTERECTOMY  1997   with bilateral oophorectomy   BREAST BIOPSY Left    multiple done   BREAST BIOPSY Right 2016   stereo- neg. FC changes   BREAST EXCISIONAL BIOPSY Right 1975   multiple biopsies done   BREAST SURGERY Left    lump removed. Unsure of date   BREAST SURGERY Right    lump removed. Unsure of date   COLONOSCOPY  2010   Dr. Vira Agar   COLONOSCOPY WITH PROPOFOL N/A 06/17/2021   Procedure: COLONOSCOPY WITH PROPOFOL;  Surgeon: Lin Landsman, MD;  Location: [facility] ENDOSCOPY;  Service: Gastroenterology;  Laterality: N/A;   ESOPHAGOGASTRODUODENOSCOPY (EGD) WITH PROPOFOL  06/17/2021   Procedure: ESOPHAGOGASTRODUODENOSCOPY (EGD) WITH PROPOFOL;  Surgeon: Lin Landsman, MD;  Location: [facility] ENDOSCOPY;  Service: Gastroenterology;;   KNEE ARTHROSCOPY WITH LATERAL MENISECTOMY Right 05/08/2020   Procedure: RIGHT KNEE ARTHROSCOPY WITH DEBRIDEMENT AND PARTIAL LATERAL MENISCECTOMY;  Surgeon: Corky Mull, MD;  Location: [facility] ORS;  Service: Orthopedics;  Laterality: Right;   LAPAROSCOPIC APPENDECTOMY N/A 12/21/2018   Procedure: APPENDECTOMY LAPAROSCOPIC CONVERTED TO OPEN;  Surgeon: Benjamine Sprague, DO;  Location: [facility] ORS;  Service: General;  Laterality: N/A;   TONSILLECTOMY     Patient Active Problem List   Diagnosis Date Noted   Low back pain 05/27/2022   Anemia 11/02/2021   History of total knee arthroplasty 09/15/2021   B12 deficiency 09/15/2021   History of CVA  (cerebrovascular accident) 08/18/2021   Edema of right lower leg 08/02/2021   Acute CVA (cerebrovascular accident) ([place]) 08/02/2021   AMS (altered mental status) 08/01/2021   Pre-op evaluation 07/24/2021   Lung nodules 07/24/2021   Dyspepsia    Burping    Polyp of cecum    Polyp of ascending colon    Vitamin D deficiency 05/11/2021   Increased heart rate 05/10/2021   Breast cancer screening 01/07/2021   Ear anomaly 07/18/2020   Complex tear of medial meniscus of right knee as current injury 06/22/2020   Breast nodule 06/10/2020   Thyroid nodule 05/29/2020   Asymmetry of clavicles 05/07/2020   Complex tear of lateral meniscus of right knee as current injury 04/30/2020   Primary osteoarthritis of right knee 04/30/2020   Leg wound, right 01/16/2020   Laceration of skin of knee 01/10/2020   Palpitations 10/10/2019   Breast pain 10/10/2019   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   Side pain 08/22/2018   History of colonic polyps 02/04/2016   Personal history of tobacco use, presenting hazards to health 08/07/2015   Health care maintenance 11/18/2014   Breast calcifications on mammogram 10/18/2014   Obesity 07/23/2014   COPD (chronic obstructive pulmonary disease) ([place]) 04/26/2014   Tobacco use disorder 04/26/2014   Abnormal mammogram 04/12/2014   UTI (urinary tract infection) 04/02/2014   SOB (shortness of breath) 02/25/2014   Stress 02/25/2014   Internal hemorrhoids without complication 29/51/8841   Osteopenia 06/18/2012   GERD (gastroesophageal reflux disease) 06/18/2012   Hypercholesterolemia 06/18/2012   Family History  Problem Relation Age of Onset   Hyperlipidemia Mother    Thyroid disease Mother    Hypertension Mother    Urinary tract infection Mother    Colon cancer Father    Stroke Father    Heart disease Father        myocardial infarction   Breast cancer Neg Hx    Social History   Tobacco Use   Smoking status: Former    Packs/day: 1.00    Years: 30.00     Total pack years: 30.00    Types: Cigarettes    Quit date: 04/26/2004    Years since quitting: 18.2    Passive exposure: Past   Smokeless tobacco: Never  Substance Use Topics   Alcohol use: Yes    Alcohol/week: 0.0 standard drinks of alcohol    Comment: occassional wine every other day   Allergies  Allergen Reactions   Benzoin Compound Other (See Comments)    After breast surgery After breast surgery   Boniva [Ibandronic Acid] Other (See Comments)    Aching    Codeine Nausea Only   Fosamax [Alendronate Sodium]     Caused aching   Lipitor [Atorvastatin] Hives   Meloxicam Other (See Comments)    GI Upset   Penicillins Swelling    Facial swelling Did it involve swelling of the face/tongue/throat, SOB, or low BP? Yes Did it involve sudden or severe rash/hives, skin peeling,  or any reaction on the inside of your mouth or nose? No Did you need to seek medical attention at a hospital or doctor's office? No When did it last happen?      within the last 10 years If all above answers are "NO", may proceed with cephalosporin use.    Current Meds  Medication Sig   acetaminophen (TYLENOL) 500 MG tablet Take 1,000 mg by mouth every 6 (six) hours as needed for moderate pain.   ADVAIR HFA 230-21 MCG/ACT inhaler INHALE 2 PUFFS BY MOUTH TWICE DAILY   aspirin EC 81 MG EC tablet Take 1 tablet (81 mg total) by mouth daily. Swallow whole.   Calcium Carb-Cholecalciferol (CALCIUM 500 +D) 500-400 MG-UNIT TABS Take 1 tablet by mouth daily.   Cholecalciferol (VITAMIN D) 2000 UNITS tablet Take 2,000 Units by mouth daily.   COMBIVENT RESPIMAT 20-100 MCG/ACT AERS respimat INHALE 1 PUF BY MOUTH EVERY 6 HOURS AS NEEDED WHEEZING   Cranberry 450 MG CAPS Take 450 mg by mouth daily.   cyclobenzaprine (FLEXERIL) 5 MG tablet Take 5 mg by mouth as needed.   ibuprofen (ADVIL) 800 MG tablet Take 800 mg by mouth 3 (three) times daily.   methocarbamol (ROBAXIN) 500 MG tablet Take 1 tablet by mouth every 6 (six)  hours as needed.   omeprazole (PRILOSEC) 20 MG capsule TAKE 1 CAPSULE BY MOUTH ONCE DAILY   pravastatin (PRAVACHOL) 80 MG tablet Take 1 tablet (80 mg total) by mouth daily.   Immunization History  Administered Date(s) Administered   Fluad Quad(high Dose 65+) 04/13/2020, 05/10/2021, 05/28/2022   Influenza Split 05/12/2012, 04/11/2013   Influenza, High Dose Seasonal PF 04/28/2017, 05/10/2018   Influenza-Unspecified 05/18/2014, 05/15/2015   PFIZER Comirnaty(Gray Top)Covid-19 Tri-Sucrose Vaccine 08/05/2019, 08/26/2019, 04/27/2020, 05/07/2021   PFIZER(Purple Top)SARS-COV-2 Vaccination 08/05/2019, 08/26/2019, 04/27/2020   Pneumococcal Conjugate-13 07/29/2017   Pneumococcal Polysaccharide-23 04/26/2014   Zoster, Live 06/13/2013       Objective:   Physical Exam BP 132/86 (BP Location: Left Arm, Cuff Size: Normal)   Pulse 82   Temp 97.6 F (36.4 C) (Oral)   Ht '5\' 2"'$  (1.575 m)   Wt 181 lb 6.4 oz (82.3 kg)   LMP 07/20/1995   SpO2 97% Comment: on RA  BMI 33.18 kg/m   SpO2: 97 % (on RA) O2 Device: None (Room air)  GENERAL: Well-developed, well-nourished woman, no acute distress, fully ambulatory, no conversational dyspnea. HEAD: Normocephalic, atraumatic.  EYES: Pupils equal, round, reactive to light.  No scleral icterus.  MOUTH: Dentition intact, no thrush. NECK: Supple. No thyromegaly. Trachea midline. No JVD.  No adenopathy. PULMONARY: Good air entry bilaterally.  Coarse, otherwise, no adventitious sounds. CARDIOVASCULAR: S1 and S2. Regular rate and rhythm.  No rubs, murmurs or gallops heard. ABDOMEN: Benign. MUSCULOSKELETAL: No joint deformity, no clubbing, no edema.  NEUROLOGIC: No overt focal deficit, no gait disturbance, speech is fluent. SKIN: Intact,warm,dry. PSYCH: Mood and behavior normal   SPN probability of malignancy ([place]): 20.8%      Assessment & Plan:     ICD-10-CM   1. Lung nodule  R91.1 CT CHEST WO CONTRAST    Pulmonary Function Test [facility] Only   Will  repeat CT chest no contrast Nodify Lung ([employer])    2. Chronic obstructive pulmonary disease, unspecified COPD type ([place])  J44.9 Pulmonary Function Test [facility] Only   PFTs to reassess Trial of Trelegy Ellipta 100 Stop Advair while on Trelegy     Orders Placed This Encounter  Procedures   CT CHEST  WO CONTRAST    3 months    Standing Status:   Future    Standing Expiration Date:   08/06/2023    Order Specific Question:   Preferred imaging location?    Answer:   [facility]   Pulmonary Function Test [facility] Only    Standing Status:   Future    Standing Expiration Date:   08/06/2023    Order Specific Question:   Full PFT: includes the following: basic spirometry, spirometry pre & post bronchodilator, diffusion capacity (DLCO), lung volumes    Answer:   Full PFT   Ordered Nodify Lung assay: Blood drawn today.  Meds ordered this encounter  Medications   Fluticasone-Umeclidin-Vilant (TRELEGY ELLIPTA) 100-62.5-25 MCG/ACT AEPB    Sig: Inhale 1 puff into the lungs daily.    Dispense:  14 each    Refill:  0    Order Specific Question:   Lot Number?    Answer:   [id number]    Order Specific Question:   Expiration Date?    Answer:   12/13/2023    Order Specific Question:   Manufacturer?    Answer:   [employer] [[id number]]    Order Specific Question:   Quantity    Answer:   2   We will see the patient in follow-up in 3 months time after CT chest has been performed.  We will also notify the patient if the appointment needs to be moved up (in the case of a positive Nodify Lung assay).  Patient is to contact us prior to follow-up appointment should any new difficulties arise.   Renold Don, MD Advanced Bronchoscopy PCCM [facility]    *This note was dictated using voice recognition software/Dragon.  Despite best efforts to proofread, errors can occur which can change the meaning. Any transcriptional errors that result from this process are unintentional and may not be  fully corrected at the time of dictation.

## 2022-08-05 NOTE — Telephone Encounter (Signed)
The note from 07/29/22 - the information is in the note.

## 2022-08-06 NOTE — Telephone Encounter (Signed)
Pt scheduled for Prolia inj on 08/07/22

## 2022-08-07 ENCOUNTER — Ambulatory Visit: Payer: Medicare HMO | Admitting: *Deleted

## 2022-08-07 ENCOUNTER — Encounter: Payer: Self-pay | Admitting: *Deleted

## 2022-08-07 DIAGNOSIS — M810 Age-related osteoporosis without current pathological fracture: Secondary | ICD-10-CM | POA: Diagnosis not present

## 2022-08-07 MED ORDER — DENOSUMAB 60 MG/ML ~~LOC~~ SOSY
60.0000 mg | PREFILLED_SYRINGE | Freq: Once | SUBCUTANEOUS | Status: AC
  Administered 2022-08-07: 60 mg via SUBCUTANEOUS

## 2022-08-07 NOTE — Progress Notes (Addendum)
Pt received Prolia injection in left arm (subcutaneous)- pt tolerated it well with no complaints or concerns.

## 2022-08-12 ENCOUNTER — Ambulatory Visit
Admission: RE | Admit: 2022-08-12 | Discharge: 2022-08-12 | Disposition: A | Discharge status: Home / Self Care | Payer: Medicare HMO | Source: Ambulatory Visit | Attending: Internal Medicine | Admitting: Internal Medicine

## 2022-08-12 DIAGNOSIS — R109 Unspecified abdominal pain: Secondary | ICD-10-CM | POA: Diagnosis not present

## 2022-08-12 MED ORDER — IOHEXOL 300 MG/ML  SOLN
100.0000 mL | Freq: Once | INTRAMUSCULAR | Status: AC | PRN
  Administered 2022-08-12: 100 mL via INTRAVENOUS

## 2022-08-13 ENCOUNTER — Encounter: Payer: Self-pay | Admitting: Pulmonary Disease

## 2022-08-15 ENCOUNTER — Telehealth: Payer: Self-pay

## 2022-08-15 DIAGNOSIS — R911 Solitary pulmonary nodule: Secondary | ICD-10-CM

## 2022-08-15 MED ORDER — TRELEGY ELLIPTA 100-62.5-25 MCG/ACT IN AEPB: 1.0000 | INHALATION_SPRAY | Freq: Every day | RESPIRATORY_TRACT | 5 refills | Status: DC

## 2022-08-15 NOTE — Telephone Encounter (Signed)
Nodify test reviewed by Dr. Patsey Berthold-- recommend PET. Order has been placed.  Dr. Patsey Berthold spoke with patient and made her aware. Patient requested trelegy. Rx sent to preferred pharmacy.  Nothing further needed.

## 2022-08-26 ENCOUNTER — Encounter: Payer: Self-pay | Admitting: Internal Medicine

## 2022-08-26 DIAGNOSIS — S22080A Wedge compression fracture of T11-T12 vertebra, initial encounter for closed fracture: Secondary | ICD-10-CM | POA: Insufficient documentation

## 2022-08-27 ENCOUNTER — Ambulatory Visit
Admission: RE | Admit: 2022-08-27 | Discharge: 2022-08-27 | Disposition: A | Discharge status: Home / Self Care | Payer: Medicare HMO | Source: Ambulatory Visit | Attending: Pulmonary Disease | Admitting: Pulmonary Disease

## 2022-08-27 DIAGNOSIS — R911 Solitary pulmonary nodule: Secondary | ICD-10-CM | POA: Insufficient documentation

## 2022-08-27 DIAGNOSIS — I2510 Atherosclerotic heart disease of native coronary artery without angina pectoris: Secondary | ICD-10-CM | POA: Diagnosis not present

## 2022-08-27 DIAGNOSIS — M8008XA Age-related osteoporosis with current pathological fracture, vertebra(e), initial encounter for fracture: Secondary | ICD-10-CM | POA: Insufficient documentation

## 2022-08-27 DIAGNOSIS — J439 Emphysema, unspecified: Secondary | ICD-10-CM | POA: Insufficient documentation

## 2022-08-27 DIAGNOSIS — M545 Low back pain, unspecified: Secondary | ICD-10-CM | POA: Diagnosis not present

## 2022-08-27 DIAGNOSIS — I700 Atherosclerosis of aorta: Secondary | ICD-10-CM | POA: Insufficient documentation

## 2022-08-27 DIAGNOSIS — E041 Nontoxic single thyroid nodule: Secondary | ICD-10-CM | POA: Diagnosis not present

## 2022-08-27 LAB — GLUCOSE, CAPILLARY: Glucose-Capillary: 90 mg/dL (ref 70–99)

## 2022-08-27 MED ORDER — FLUDEOXYGLUCOSE F - 18 (FDG) INJECTION
9.4000 | Freq: Once | INTRAVENOUS | Status: AC | PRN
  Administered 2022-08-27: 10.32 via INTRAVENOUS

## 2022-08-28 ENCOUNTER — Other Ambulatory Visit: Payer: Self-pay

## 2022-08-28 DIAGNOSIS — R911 Solitary pulmonary nodule: Secondary | ICD-10-CM

## 2022-09-02 NOTE — Progress Notes (Signed)
As documented on progress notes.  The patient has a lung nodule that had a high probability for malignancy according to [facility] Pulmonary Nodule Malignancy Risk Score and Nodify Lung ([employer]) assay.  PET/CT was necessary to evaluate for possibility of malignancy and direct next steps for management.  Renold Don, MD Advanced Bronchoscopy PCCM [facility]

## 2022-10-31 ENCOUNTER — Other Ambulatory Visit (INDEPENDENT_AMBULATORY_CARE_PROVIDER_SITE_OTHER): Payer: Medicare HMO

## 2022-10-31 DIAGNOSIS — M810 Age-related osteoporosis without current pathological fracture: Secondary | ICD-10-CM | POA: Diagnosis not present

## 2022-10-31 DIAGNOSIS — E7800 Pure hypercholesterolemia, unspecified: Secondary | ICD-10-CM

## 2022-10-31 LAB — VITAMIN D 25 HYDROXY (VIT D DEFICIENCY, FRACTURES): VITD: 28.44 ng/mL — ABNORMAL LOW (ref 30.00–100.00)

## 2022-10-31 LAB — HEPATIC FUNCTION PANEL
ALT: 10 U/L (ref 0–35)
AST: 13 U/L (ref 0–37)
Albumin: 4.2 g/dL (ref 3.5–5.2)
Alkaline Phosphatase: 83 U/L (ref 39–117)
Bilirubin, Direct: 0.1 mg/dL (ref 0.0–0.3)
Total Bilirubin: 0.5 mg/dL (ref 0.2–1.2)
Total Protein: 6.4 g/dL (ref 6.0–8.3)

## 2022-10-31 LAB — LIPID PANEL
Cholesterol: 159 mg/dL (ref 0–200)
HDL: 57.8 mg/dL (ref >39.00)
LDL Cholesterol: 84 mg/dL (ref 0–99)
NonHDL: 100.89
Total CHOL/HDL Ratio: 3
Triglycerides: 83 mg/dL (ref 0.0–149.0)
VLDL: 16.6 mg/dL (ref 0.0–40.0)

## 2022-10-31 LAB — BASIC METABOLIC PANEL
BUN: 13 mg/dL (ref 6–23)
CO2: 29 mEq/L (ref 19–32)
Calcium: 9.2 mg/dL (ref 8.4–10.5)
Chloride: 105 mEq/L (ref 96–112)
Creatinine, Ser: 0.7 mg/dL (ref 0.40–1.20)
GFR: 85.06 mL/min (ref >60.00)
Glucose, Bld: 92 mg/dL (ref 70–99)
Potassium: 4.2 mEq/L (ref 3.5–5.1)
Sodium: 142 mEq/L (ref 135–145)

## 2022-11-04 ENCOUNTER — Ambulatory Visit: Payer: Medicare HMO | Admitting: Urology

## 2022-11-05 ENCOUNTER — Ambulatory Visit (INDEPENDENT_AMBULATORY_CARE_PROVIDER_SITE_OTHER): Payer: Medicare HMO | Admitting: Internal Medicine

## 2022-11-05 ENCOUNTER — Encounter: Payer: Self-pay | Admitting: Internal Medicine

## 2022-11-05 VITALS — BP 118/70 | HR 85 | Temp 97.9°F | Resp 16 | Ht 63.0 in | Wt 178.0 lb

## 2022-11-05 DIAGNOSIS — E559 Vitamin D deficiency, unspecified: Secondary | ICD-10-CM

## 2022-11-05 DIAGNOSIS — F439 Reaction to severe stress, unspecified: Secondary | ICD-10-CM

## 2022-11-05 DIAGNOSIS — K219 Gastro-esophageal reflux disease without esophagitis: Secondary | ICD-10-CM

## 2022-11-05 DIAGNOSIS — E041 Nontoxic single thyroid nodule: Secondary | ICD-10-CM

## 2022-11-05 DIAGNOSIS — Z8673 Personal history of transient ischemic attack (TIA), and cerebral infarction without residual deficits: Secondary | ICD-10-CM

## 2022-11-05 DIAGNOSIS — D649 Anemia, unspecified: Secondary | ICD-10-CM

## 2022-11-05 DIAGNOSIS — R918 Other nonspecific abnormal finding of lung field: Secondary | ICD-10-CM

## 2022-11-05 DIAGNOSIS — Z86010 Personal history of colonic polyps: Secondary | ICD-10-CM

## 2022-11-05 DIAGNOSIS — E7800 Pure hypercholesterolemia, unspecified: Secondary | ICD-10-CM

## 2022-11-05 DIAGNOSIS — J449 Chronic obstructive pulmonary disease, unspecified: Secondary | ICD-10-CM | POA: Diagnosis not present

## 2022-11-05 DIAGNOSIS — S22080S Wedge compression fracture of T11-T12 vertebra, sequela: Secondary | ICD-10-CM | POA: Diagnosis not present

## 2022-11-05 NOTE — Progress Notes (Signed)
Subjective:    Patient ID: Alexis Reed, female    DOB: 1946-02-14, 77 y.o.   MRN: [id number]  Patient here for  Chief Complaint  Patient presents with   Medical Management of Chronic Issues    HPI Here for follow up regarding history of CVA, sleep apnea and hypercholesterolemia. Saw Dr Jayme Cloud - 08/05/22 - f/u lung nodule.  Started on trelegy. Had PET scan which revealed right upper lobe pulmonary nodule - stable from recent studies, but decreased in size from more remote priors - favored to reflect a resolving infectious/inflammatory process.  States recommended f/u in 6 months.  Has been seeing ortho- compression fractures. s. MRI of the lumbar spine shows L4-5 spondylolisthesis. She has compression fracture at T10 and T11 superior end plates, then W09 as well. Essentially compression fractures at T10 through L1, L3, L5.  Going to PT.  Helping.  Receiving prolia.  Overall feels things are stable.  No chest pain reported.  Breathing stable.  No abdominal pain or bowels moving.    Past Medical History:  Diagnosis Date   Arthritis    Benign breast lumps    multiple lumps biopsy and remove x's 5 last being 1979 by Dr. Okey Dupre   COPD (chronic obstructive pulmonary disease) (HCC)    MILD-RARELY EVER HAS TO USE COMBIVENT INHALER   Fibroids    s/p hysterectomy   GERD (gastroesophageal reflux disease)    Hypercholesterolemia    Osteoporosis    Personal history of tobacco use, presenting hazards to health 08/07/2015   Urinary tract infection    Past Surgical History:  Procedure Laterality Date   ABDOMINAL HYSTERECTOMY  1997   with bilateral oophorectomy   BREAST BIOPSY Left    multiple done   BREAST BIOPSY Right 2016   stereo- neg. FC changes   BREAST EXCISIONAL BIOPSY Right 1975   multiple biopsies done   BREAST SURGERY Left    lump removed. Unsure of date   BREAST SURGERY Right    lump removed. Unsure of date   COLONOSCOPY  2010   Dr. Mechele Collin   COLONOSCOPY WITH PROPOFOL  N/A 06/17/2021   Procedure: COLONOSCOPY WITH PROPOFOL;  Surgeon: Toney Reil, MD;  Location: [facility] ENDOSCOPY;  Service: Gastroenterology;  Laterality: N/A;   ESOPHAGOGASTRODUODENOSCOPY (EGD) WITH PROPOFOL  06/17/2021   Procedure: ESOPHAGOGASTRODUODENOSCOPY (EGD) WITH PROPOFOL;  Surgeon: Toney Reil, MD;  Location: [facility] ENDOSCOPY;  Service: Gastroenterology;;   KNEE ARTHROSCOPY WITH LATERAL MENISECTOMY Right 05/08/2020   Procedure: RIGHT KNEE ARTHROSCOPY WITH DEBRIDEMENT AND PARTIAL LATERAL MENISCECTOMY;  Surgeon: Christena Flake, MD;  Location: [facility] ORS;  Service: Orthopedics;  Laterality: Right;   LAPAROSCOPIC APPENDECTOMY N/A 12/21/2018   Procedure: APPENDECTOMY LAPAROSCOPIC CONVERTED TO OPEN;  Surgeon: Sung Amabile, DO;  Location: [facility] ORS;  Service: General;  Laterality: N/A;   TONSILLECTOMY     Family History  Problem Relation Age of Onset   Hyperlipidemia Mother    Thyroid disease Mother    Hypertension Mother    Urinary tract infection Mother    Colon cancer Father    Stroke Father    Heart disease Father        myocardial infarction   Breast cancer Neg Hx    Social History   Socioeconomic History   Marital status: Married    Spouse name: Not on file   Number of children: 2   Years of education: Not on file   Highest education level: Not on file  Occupational History  Not on file  Tobacco Use   Smoking status: Former    Packs/day: 1.00    Years: 30.00    Additional pack years: 0.00    Total pack years: 30.00    Types: Cigarettes    Quit date: 04/26/2004    Years since quitting: 18.5    Passive exposure: Past   Smokeless tobacco: Never  Vaping Use   Vaping Use: Never used  Substance and Sexual Activity   Alcohol use: Yes    Alcohol/week: 0.0 standard drinks of alcohol    Comment: occassional wine every other day   Drug use: No   Sexual activity: Yes    Birth control/protection: Post-menopausal  Other Topics Concern   Not on file  Social History  Narrative   She is married, has two children. 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   Social Determinants of Health   Financial Resource Strain: Low Risk  (11/01/2022)   Overall Financial Resource Strain (CARDIA)    Difficulty of Paying Living Expenses: Not hard at all  Food Insecurity: No Food Insecurity (11/01/2022)   Hunger Vital Sign    Worried About Running Out of Food in the Last Year: Never true    Ran Out of Food in the Last Year: Never true  Transportation Needs: No Transportation Needs (11/01/2022)   PRAPARE - Administrator, Civil Service (Medical): No    Lack of Transportation (Non-Medical): No  Physical Activity: Insufficiently Active (11/01/2022)   Exercise Vital Sign    Days of Exercise per Week: 1 day    Minutes of Exercise per Session: 20 min  Stress: No Stress Concern Present (11/01/2022)   [employer] of Occupational Health - Occupational Stress Questionnaire    Feeling of Stress : Not at all  Social Connections: Socially Integrated (11/01/2022)   Social Connection and Isolation Panel [NHANES]    Frequency of Communication with Friends and Family: More than three times a week    Frequency of Social Gatherings with Friends and Family: Twice a week    Attends Religious Services: More than 4 times per year    Active Member of [employer] or Organizations: Yes    Attends Banker Meetings: 1 to 4 times per year    Marital Status: Married     Review of Systems  Constitutional:  Negative for appetite change and unexpected weight change.  HENT:  Negative for congestion and sinus pressure.   Respiratory:  Negative for cough, chest tightness and shortness of breath.   Cardiovascular:  Negative for chest pain and palpitations.  Gastrointestinal:  Negative for abdominal pain, diarrhea, nausea and vomiting.  Genitourinary:  Negative for difficulty urinating and dysuria.  Musculoskeletal:  Negative for joint swelling and myalgias.       Back  pain - compression fractures - PT helping.   Skin:  Negative for color change and rash.  Neurological:  Negative for dizziness and headaches.  Psychiatric/Behavioral:  Negative for agitation and dysphoric mood.        Objective:     BP 118/70   Pulse 85   Temp 97.9 F (36.6 C)   Resp 16   Ht 5\' 3"  (1.6 m)   Wt 178 lb (80.7 kg)   LMP 07/20/1995   SpO2 97%   BMI 31.53 kg/m  Wt Readings from Last 3 Encounters:  11/05/22 178 lb (80.7 kg)  08/05/22 181 lb 6.4 oz (82.3 kg)  07/29/22 180 lb (81.6 kg)  Physical Exam Vitals reviewed.  Constitutional:      General: She is not in acute distress.    Appearance: Normal appearance.  HENT:     Head: Normocephalic and atraumatic.     Right Ear: External ear normal.     Left Ear: External ear normal.  Eyes:     General: No scleral icterus.       Right eye: No discharge.        Left eye: No discharge.     Conjunctiva/sclera: Conjunctivae normal.  Neck:     Thyroid: No thyromegaly.  Cardiovascular:     Rate and Rhythm: Normal rate and regular rhythm.  Pulmonary:     Effort: No respiratory distress.     Breath sounds: Normal breath sounds. No wheezing.  Abdominal:     General: Bowel sounds are normal.     Palpations: Abdomen is soft.     Tenderness: There is no abdominal tenderness.  Musculoskeletal:        General: No swelling or tenderness.     Cervical back: Neck supple. No tenderness.  Lymphadenopathy:     Cervical: No cervical adenopathy.  Skin:    Findings: No erythema or rash.  Neurological:     Mental Status: She is alert.  Psychiatric:        Mood and Affect: Mood normal.        Behavior: Behavior normal.      Outpatient Encounter Medications as of 11/05/2022  Medication Sig   acetaminophen (TYLENOL) 500 MG tablet Take 1,000 mg by mouth every 6 (six) hours as needed for moderate pain.   aspirin EC 81 MG EC tablet Take 1 tablet (81 mg total) by mouth daily. Swallow whole.   Calcium Carb-Cholecalciferol  (CALCIUM 500 +D) 500-400 MG-UNIT TABS Take 1 tablet by mouth daily.   Cholecalciferol (VITAMIN D) 2000 UNITS tablet Take 2,000 Units by mouth daily.   COMBIVENT RESPIMAT 20-100 MCG/ACT AERS respimat INHALE 1 PUF BY MOUTH EVERY 6 HOURS AS NEEDED WHEEZING   Cranberry 450 MG CAPS Take 450 mg by mouth daily.   cyclobenzaprine (FLEXERIL) 5 MG tablet Take 5 mg by mouth as needed.   Fluticasone-Umeclidin-Vilant (TRELEGY ELLIPTA) 100-62.5-25 MCG/ACT AEPB Inhale 1 puff into the lungs daily.   ibuprofen (ADVIL) 800 MG tablet Take 800 mg by mouth 3 (three) times daily.   methocarbamol (ROBAXIN) 500 MG tablet Take 1 tablet by mouth every 6 (six) hours as needed.   omeprazole (PRILOSEC) 20 MG capsule TAKE 1 CAPSULE BY MOUTH ONCE DAILY   pravastatin (PRAVACHOL) 80 MG tablet Take 1 tablet (80 mg total) by mouth daily.   No facility-administered encounter medications on file as of 11/05/2022.     Lab Results  Component Value Date   WBC 5.4 07/23/2022   HGB 14.1 07/23/2022   HCT 42.0 07/23/2022   PLT 308.0 07/23/2022   GLUCOSE 92 10/31/2022   CHOL 159 10/31/2022   TRIG 83.0 10/31/2022   HDL 57.80 10/31/2022   LDLDIRECT 153.0 07/17/2015   LDLCALC 84 10/31/2022   ALT 10 10/31/2022   AST 13 10/31/2022   NA 142 10/31/2022   K 4.2 10/31/2022   CL 105 10/31/2022   CREATININE 0.70 10/31/2022   BUN 13 10/31/2022   CO2 29 10/31/2022   TSH 1.73 07/23/2022   INR 0.9 07/23/2021   HGBA1C 5.5 08/02/2021    NM PET Image Initial (PI) Skull Base To Thigh (F-18 FDG)  Result Date: 08/27/2022 CLINICAL DATA:  Initial treatment  strategy for pulmonary nodule. EXAM: NUCLEAR MEDICINE PET SKULL BASE TO THIGH TECHNIQUE: 10.32 mCi F-18 FDG was injected intravenously. Full-ring PET imaging was performed from the skull base to thigh after the radiotracer. CT data was obtained and used for attenuation correction and anatomic localization. Fasting blood glucose: 90 mg/dl COMPARISON:  CT abdomen pelvis August 12, 2022 and  chest CT May 20, 2022 FINDINGS: Mediastinal blood pool activity: SUV max 1.8 Liver activity: SUV max NA NECK: No hypermetabolic cervical adenopathy. Incidental CT findings: Hypodense non hypermetabolic 1.8 cm nodule posterior to the right thyroid is stable and has been previously evaluated on prior thyroid ultrasound June 19, 2020 CHEST: Minimal FDG avidity in a right upper lobe pulmonary nodule, similar to background mediastinal blood pool, which measures 8 mm on image 71/2 with a max SUV of 1.9. This previously measured 16 x 11 mm. No hypermetabolic thoracic adenopathy. Incidental CT findings: Biapical pleuroparenchymal scarring. Centrilobular and paraseptal emphysema. Scattered areas of atelectasis/scarring. Aortic atherosclerosis. Coronary artery calcifications. Patulous esophagus. ABDOMEN/PELVIS: No abnormal hypermetabolic activity within the liver, pancreas, adrenal glands, or spleen. No hypermetabolic lymph nodes in the abdomen or pelvis. Incidental CT findings: Aortic atherosclerosis. SKELETON: No focal hypermetabolic activity to suggest skeletal metastasis. Incidental CT findings: No FDG avidity about the multilevel compression deformities at T10, T11, L1, L3 and L5. Diffuse demineralization of bone. IMPRESSION: 1. Minimal FDG avidity in the 8 mm right upper lobe pulmonary nodule, similar intensity to background mediastinal blood pool, which is stable in size from recent studies but decreased in size from more remote priors 16 x 11 mm, favored to reflect a resolving infectious/inflammatory process. However this warrants continued attention on follow-up chest CT. 2. No FDG avidity about the multilevel compression deformities at T10, T11, L1, L3 and L5. 3.  Aortic Atherosclerosis (ICD10-I70.0). Electronically Signed   By: Maudry Mayhew M.D.   On: 08/27/2022 13:55       Assessment & Plan:  Hypercholesterolemia Assessment & Plan: On pravastatin.  Low cholesterol diet and exercise. Follow lipid  panel and liver function tests.    Orders: -     Lipid panel; Future -     Basic metabolic panel; Future -     Hepatic function panel; Future  Anemia, unspecified type Assessment & Plan: Follow cbc.    Compression fracture of T12 vertebra, sequela Assessment & Plan: EMERGE - T 11-12 -  PT - helping.    Chronic obstructive pulmonary disease, unspecified COPD type (HCC) Assessment & Plan: Followed by Dr Belia Heman - abnormal CT RUL nodule - f/u CT scan in 3 months.  Evaluated 03/2022.  Had f/u CT 06/09/22 - Lung nodule -  Increased size of the bilobed nodule in the anterior right upper lobe now measuring 8 x 5 mm. Recommended f/u with pulmonary. Saw Dr Jayme Cloud.  PET as outlined - revealed right upper lobe pulmonary nodule - stable from recent studies, but decreased in size from more remote priors - favored to reflect a resolving infectious/inflammatory process.  Continue inhaled corticosteroids and LABA. Breathing stable. F/u scan in 6 months.    Gastroesophageal reflux disease, unspecified whether esophagitis present Assessment & Plan: Continue prilosec.    History of colonic polyps Assessment & Plan: Colonoscopy 06/2021.  Recommended f/u in 3 years.    History of CVA (cerebrovascular accident) Assessment & Plan: Found to have acute corpus callosum infarct.  On plavix and aspirin.  Symptoms improved.  Seeing Dr Sherryll Burger. Continue aspirin daily and pravastatin.    Lung nodules  Assessment & Plan: Dr Belia Heman f/u 03/19/22 -  Abnormal CT chest right upper lobe nodule Follow-up CT scan in 3 months Patient is at a high risk for malignancy due to extensive smoking history  08/12/22 - Dr Jayme Cloud - PET as outlined. PFTs.  Trelegy 100   Stress Assessment & Plan: Overall appears to be handling things well.  Follow.    Thyroid nodule Assessment & Plan: Saw endocrinology.  Recommended f/u ultrasound and TSH in 12 months.     Vitamin D deficiency Assessment & Plan: Follow vitamin D level.    Orders: -     VITAMIN D 25 Hydroxy (Vit-D Deficiency, Fractures); Future     Dale San Fernando, MD

## 2022-11-09 ENCOUNTER — Encounter: Payer: Self-pay | Admitting: Internal Medicine

## 2022-11-09 NOTE — Assessment & Plan Note (Signed)
Follow cbc.  

## 2022-11-09 NOTE — Assessment & Plan Note (Signed)
Overall appears to be handling things well.  Follow.  ?

## 2022-11-09 NOTE — Assessment & Plan Note (Signed)
Follow vitamin D level.  

## 2022-11-09 NOTE — Assessment & Plan Note (Signed)
Continue prilosec

## 2022-11-09 NOTE — Assessment & Plan Note (Signed)
On pravastatin.  Low cholesterol diet and exercise.  Follow lipid panel and liver function tests.   

## 2022-11-09 NOTE — Assessment & Plan Note (Signed)
Saw endocrinology.  Recommended f/u ultrasound and TSH in 12 months.   °

## 2022-11-09 NOTE — Assessment & Plan Note (Signed)
EMERGE - T 11-12 -  PT - helping.

## 2022-11-09 NOTE — Assessment & Plan Note (Signed)
Followed by Dr Belia Heman - abnormal CT RUL nodule - f/u CT scan in 3 months.  Evaluated 03/2022.  Had f/u CT 06/09/22 - Lung nodule -  Increased size of the bilobed nodule in the anterior right upper lobe now measuring 8 x 5 mm. Recommended f/u with pulmonary. Saw Dr Jayme Cloud.  PET as outlined - revealed right upper lobe pulmonary nodule - stable from recent studies, but decreased in size from more remote priors - favored to reflect a resolving infectious/inflammatory process.  Continue inhaled corticosteroids and LABA. Breathing stable. F/u scan in 6 months.

## 2022-11-09 NOTE — Assessment & Plan Note (Signed)
Colonoscopy 06/2021.  Recommended f/u in 3 years.  ?

## 2022-11-09 NOTE — Assessment & Plan Note (Signed)
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  On plavix and aspirin.  Symptoms improved.  Seeing Dr Shah. Continue aspirin daily and pravastatin.  

## 2022-11-09 NOTE — Assessment & Plan Note (Signed)
Dr Belia Heman f/u 03/19/22 -  Abnormal CT chest right upper lobe nodule Follow-up CT scan in 3 months Patient is at a high risk for malignancy due to extensive smoking history  08/12/22 - Dr Jayme Cloud - PET as outlined. PFTs.  Trelegy 100

## 2022-11-27 ENCOUNTER — Other Ambulatory Visit: Payer: Self-pay | Admitting: Family

## 2023-01-07 ENCOUNTER — Telehealth: Payer: Self-pay

## 2023-01-07 NOTE — Telephone Encounter (Signed)
PA submitted via [employer].   Authorization Number: [id number]  Status pending

## 2023-01-08 ENCOUNTER — Ambulatory Visit: Payer: Medicare HMO | Attending: Neurology | Admitting: Speech Pathology

## 2023-01-08 DIAGNOSIS — R41841 Cognitive communication deficit: Secondary | ICD-10-CM | POA: Diagnosis present

## 2023-01-08 NOTE — Therapy (Signed)
OUTPATIENT SPEECH LANGUAGE PATHOLOGY  EVALUATION   Patient Name: Alexis Reed MRN: [id number] DOB:09-04-1945, 77 y.o., female Today's Date: 01/08/2023  PCP: Lorin Picket, MD REFERRING PROVIDER: Sherryll Burger, MD   End of Session - 01/08/23 1338     Visit Number 1    Number of Visits 25    Date for SLP Re-Evaluation 04/02/23    SLP Start Time 1100    SLP Stop Time  1155    SLP Time Calculation (min) 55 min             Patient Active Problem List   Diagnosis Date Noted   Compression fracture of T12 vertebra (HCC) 08/26/2022   Low back pain 05/27/2022   Anemia 11/02/2021   History of total knee arthroplasty 09/15/2021   B12 deficiency 09/15/2021   History of CVA (cerebrovascular accident) 08/18/2021   Edema of right lower leg 08/02/2021   Acute CVA (cerebrovascular accident) (HCC) 08/02/2021   AMS (altered mental status) 08/01/2021   Pre-op evaluation 07/24/2021   Lung nodules 07/24/2021   Dyspepsia    Burping    Polyp of cecum    Polyp of ascending colon    Vitamin D deficiency 05/11/2021   Increased heart rate 05/10/2021   Breast cancer screening 01/07/2021   Ear anomaly 07/18/2020   Complex tear of medial meniscus of right knee as current injury 06/22/2020   Breast nodule 06/10/2020   Thyroid nodule 05/29/2020   Asymmetry of clavicles 05/07/2020   Complex tear of lateral meniscus of right knee as current injury 04/30/2020   Primary osteoarthritis of right knee 04/30/2020   Leg wound, right 01/16/2020   Laceration of skin of knee 01/10/2020   Palpitations 10/10/2019   Breast pain 10/10/2019   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   Side pain 08/22/2018   History of colonic polyps 02/04/2016   Personal history of tobacco use, presenting hazards to health 08/07/2015   Health care maintenance 11/18/2014   Breast calcifications on mammogram 10/18/2014   Obesity 07/23/2014   COPD (chronic obstructive pulmonary disease) (HCC) 04/26/2014   Tobacco use disorder 04/26/2014    Abnormal mammogram 04/12/2014   UTI (urinary tract infection) 04/02/2014   SOB (shortness of breath) 02/25/2014   Stress 02/25/2014   Internal hemorrhoids without complication 09/24/2013   Osteopenia 06/18/2012   GERD (gastroesophageal reflux disease) 06/18/2012   Hypercholesterolemia 06/18/2012    ONSET DATE:  08/01/21 (stroke); 11/24/22 (referral date)  REFERRING DIAG: cognitive deficit; post stroke  THERAPY DIAG:  Cognitive communication deficit  Rationale for Evaluation and Treatment Rehabilitation  SUBJECTIVE:   SUBJECTIVE STATEMENT: Pt pleasant and cooperative. "I lose conversations." Pt accompanied by: self  PERTINENT HISTORY:  Pt is a 77 y.o. female s/p stroke in January 2023. MRI showed abnormal restricted diffusion involving the central and left aspect of the anterior genu of the corpus callosum, with extension to involve the fornices bilaterally. Finding likely reflects changes of an acute ACA distribution infarct. PMH: arthritis, COPD, osteoporosis. Pt had course of HH SLP, but endorses continued difficulty with memory.    PAIN:  Are you having pain? No   FALLS: Has patient fallen in last 6 months?  Yes  LIVING ENVIRONMENT: Lives with: lives with their spouse Lives in: House/apartment  PLOF:  Level of assistance: Independent with ADLs Employment: Other: works occasionally for the [employer], sport and exercise; retired from [employer] DSS   PATIENT GOALS   to figure out a way to improve memory  OBJECTIVE:   COGNITIVE COMMUNICATION: Overall cognitive  status: Impaired Areas of impairment:  Memory: Impaired: Working Development worker, community comprehension: WFL Verbal expression: WFL Functional communication: WFL Functional deficits: subjective report of difficulty recalling conversatoins  AUDITORY COMPREHENSION: Overall auditory comprehension: Appears intact    READING COMPREHENSION: Intact  EXPRESSION: verbal  VERBAL EXPRESSION: Level of  generative/spontaneous verbalization: conversation Automatic speech: name: intact and social response: intact  Repetition: Appears intact Naming: Responsive: WFL, Confrontation: WFL, and Divergent: 6 "p" words and 15 "animals" in 60s Pragmatics: Appears intact  Interfering components:  suspect working memory interfering   WRITTEN EXPRESSION: Dominant hand: right   Written expression: Appears intact  MOTOR SPEECH: Overall motor speech: Appears intact   ORAL MOTOR EXAMINATION: WFL  STANDARDIZED ASSESSMENTS: Addenbrooke's Cognitive Examination - ACE III The Addenbrooke's Cognitive Examination-III (ACE-III) is a brief cognitive test that assesses five cognitive domains. The total score is 100 with higher scores indicating better cognitive functioning. Cut off scores of 88 and 82 are recommended for suspicion of dementia (88 has sensitivity of 1.00 and specificity of 0.96, 82 has sensitivity of 0.93 and specificity of 1.00). American Version A  Attention 17/18  Memory 22/26  Fluency 8/14  Language 26/26  Visuospatial 14/16  TOTAL ACE- III Score 87/100     PATIENT REPORTED OUTCOME MEASURES (PROM):  MULTIFACTORIAL MEMORY QUESTIONNAIRE (MMQ)  Administered patient self-reported outcome measure Multifactorial Memory Questionnaire (MMQ). The Multifactorial Memory Questionnaire [facility]) consists of three scales measuring separate aspects of metamemory; Satisfaction, Ability and Strategy.   Pt's responses are converted to T-Scores with severity levels based on pt's T-Score.   Severity Levels (T-score) Very Low - < 20 Low - 20 to 29 Below Average - 30-39 Average - 40 to 60 Above Average - 60 to 70 High - 71 to 80 Very High - > 80  Pt reports:  Average - 40 to 60 Memory Satisfaction (T-score: 48) Average - 40 to 60 Memory Ability (T-score: 45) Below Average - 30-39 use of Memory Strategies (T-score: 52)      PATIENT EDUCATION: Education details: role of SLP, results of  assessment, SLP POC  Person educated: Patient Education method: Explanation Education comprehension: verbalized understanding  HOME EXERCISE PROGRAM:        To be given in next 1-2 sessions    GOALS:  Goals reviewed with patient? Yes  SHORT TERM GOALS: Target date: 10 sessions  Patient will implement compensations for memory and attention in 5 minutes conversation or listening activity to recall 80% of details with use of external aid.  Baseline: Goal status: INITIAL   2.  Patient will identify x3 compensations to improve functional attention and memory x2 sessions.  Baseline:  Goal status: INITIAL    LONG TERM GOALS: Target date: 04/02/23  Patient will use compensations for memory and attention for 15 minutes during mod complex conversation or healthcare visit to recall 80% of details with external aid.  Baseline:  Goal status: INITIAL  2.  Patient will report use of attention and memory compensations for "critical" conversations at home. Baseline:  Goal status: INITIAL   ASSESSMENT:  CLINICAL IMPRESSION:  Patient is a 77  y.o. female who was seen today for cognitive-communication evaluation due to cognitive deficits following stroke in January 2023. Evaluation completed via ACE-III and PROM. Pt presents with cognitive-linguistic deficits mainly affecting attention and memory (working, short term). Reduced verbal fluency appreciated on testing during generative naming tasks; however, suspect verbal fluency impacted by attention and memory impairments. Other domains of cognition were judged to be [facility].  Pt reports being "bothered" by her memory especially in conversations with family. I recommend course of skilled ST for training in strategies to manage cognitive impairments in order to improve function and quality of life.   OBJECTIVE IMPAIRMENTS include attention and memory. These impairments are limiting patient's QoL and participation in conversations in home  environment. Factors affecting potential to achieve goals and functional outcome are  medical diagnosis Patient will benefit from skilled SLP services to address above impairments and improve overall function.  REHAB POTENTIAL: Good  PLAN: SLP FREQUENCY: 1-2x/week  SLP DURATION: 12 weeks  PLANNED INTERVENTIONS: Internal/external aids, Multimodal communication approach, SLP instruction and feedback, Compensatory strategies, Patient/family education, and Re-evaluation    Clyde Canterbury, M.S., CCC-SLP Speech-Language Pathologist   [facility] at [facility] [address] [place], [place], [address] Phone: [phone]   Fax:  [phone]

## 2023-01-08 NOTE — Addendum Note (Signed)
Addended by: Woodroe Chen on: 01/08/2023 02:15 PM   Modules accepted: Orders

## 2023-01-09 ENCOUNTER — Other Ambulatory Visit (HOSPITAL_COMMUNITY): Payer: Self-pay

## 2023-01-09 ENCOUNTER — Encounter: Payer: Self-pay | Admitting: Pulmonary Disease

## 2023-01-09 NOTE — Telephone Encounter (Signed)
Pt ready for scheduling for PROLIA on or after : 02/04/23  Out-of-pocket cost due at time of visit: $30  Primary: [employer] Prolia co-insurance: $10 Admin fee co-insurance: $20  Secondary: --- Prolia co-insurance:  Admin fee co-insurance:   Medical Benefit Details: Date Benefits were checked: 01/07/23 Deductible: NO/ Coinsurance: $10/ Admin Fee: $20  Prior Auth: APPROVED PA# [id number]  Expiration Date: 01/07/23-01/07/24   Pharmacy benefit: Copay $60 If patient wants fill through the pharmacy benefit please send prescription to: [employer], and include estimated need by date in rx notes. Pharmacy will ship medication directly to the office.  Patient not eligible for Prolia Copay Card. Copay Card can make patient's cost as little as $25. Link to apply: [email or web address]://[email or web address].[email or web address].[email or web address]  ** This summary of benefits is an estimation of the patient's out-of-pocket cost. Exact cost may very based on individual plan coverage.

## 2023-01-14 ENCOUNTER — Encounter: Payer: Self-pay | Admitting: *Deleted

## 2023-01-14 NOTE — Telephone Encounter (Signed)
Appt on 02/10/23-mychart message sent

## 2023-01-20 ENCOUNTER — Ambulatory Visit: Payer: Medicare HMO | Attending: Neurology | Admitting: Speech Pathology

## 2023-01-20 DIAGNOSIS — R41841 Cognitive communication deficit: Secondary | ICD-10-CM | POA: Insufficient documentation

## 2023-01-20 NOTE — Therapy (Signed)
OUTPATIENT SPEECH LANGUAGE PATHOLOGY  COGNITIVE-COMMUNICATION TREATMENT   Patient Name: Alexis Reed MRN: [id number] DOB:15-Feb-1946, 77 y.o., female Today's Date: 01/20/2023  PCP: Lorin Picket, MD REFERRING PROVIDER: Sherryll Burger, MD   End of Session - 01/20/23 1536     Visit Number 2    Number of Visits 25    Date for SLP Re-Evaluation 04/02/23    SLP Start Time 1355    SLP Stop Time  1445    SLP Time Calculation (min) 50 min    Activity Tolerance Patient tolerated treatment well             Patient Active Problem List   Diagnosis Date Noted   Compression fracture of T12 vertebra (HCC) 08/26/2022   Low back pain 05/27/2022   Anemia 11/02/2021   History of total knee arthroplasty 09/15/2021   B12 deficiency 09/15/2021   History of CVA (cerebrovascular accident) 08/18/2021   Edema of right lower leg 08/02/2021   Acute CVA (cerebrovascular accident) (HCC) 08/02/2021   AMS (altered mental status) 08/01/2021   Pre-op evaluation 07/24/2021   Lung nodules 07/24/2021   Dyspepsia    Burping    Polyp of cecum    Polyp of ascending colon    Vitamin D deficiency 05/11/2021   Increased heart rate 05/10/2021   Breast cancer screening 01/07/2021   Ear anomaly 07/18/2020   Complex tear of medial meniscus of right knee as current injury 06/22/2020   Breast nodule 06/10/2020   Thyroid nodule 05/29/2020   Asymmetry of clavicles 05/07/2020   Complex tear of lateral meniscus of right knee as current injury 04/30/2020   Primary osteoarthritis of right knee 04/30/2020   Leg wound, right 01/16/2020   Laceration of skin of knee 01/10/2020   Palpitations 10/10/2019   Breast pain 10/10/2019   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   Side pain 08/22/2018   History of colonic polyps 02/04/2016   Personal history of tobacco use, presenting hazards to health 08/07/2015   Health care maintenance 11/18/2014   Breast calcifications on mammogram 10/18/2014   Obesity 07/23/2014   COPD (chronic obstructive  pulmonary disease) (HCC) 04/26/2014   Tobacco use disorder 04/26/2014   Abnormal mammogram 04/12/2014   UTI (urinary tract infection) 04/02/2014   SOB (shortness of breath) 02/25/2014   Stress 02/25/2014   Internal hemorrhoids without complication 09/24/2013   Osteopenia 06/18/2012   GERD (gastroesophageal reflux disease) 06/18/2012   Hypercholesterolemia 06/18/2012    ONSET DATE:  08/01/21 (stroke); 11/24/22 (referral date)  REFERRING DIAG: cognitive deficit; post stroke  THERAPY DIAG:  Cognitive communication deficit  Rationale for Evaluation and Treatment Rehabilitation  SUBJECTIVE:   SUBJECTIVE STATEMENT: Pt pleasant and cooperative. "I lose conversations." Pt accompanied by: self  PERTINENT HISTORY:  Pt is a 77 y.o. female s/p stroke in January 2023. MRI showed abnormal restricted diffusion involving the central and left aspect of the anterior genu of the corpus callosum, with extension to involve the fornices bilaterally. Finding likely reflects changes of an acute ACA distribution infarct. PMH: arthritis, COPD, osteoporosis. Pt had course of HH SLP, but endorses continued difficulty with memory.    PAIN:  Are you having pain? No   FALLS: Has patient fallen in last 6 months?  Yes  LIVING ENVIRONMENT: Lives with: lives with their spouse Lives in: House/apartment  PLOF:  Level of assistance: Independent with ADLs Employment: Other: works occasionally for the [employer], sport and exercise; retired from [employer] DSS   PATIENT GOALS   to figure out a way to  improve memory  OBJECTIVE:   TODAY'S TREATMENT: Skilled education provided this date re: metacognition, changes to attention following stroke, types of attention, environmental modifications to improve attention, factors that may affect attention/overall cognition, and concept of "cognitive load." Pt provided with handout regarding attention strategies for reinforcement of content.Pt able to identify x3 personally  relevant strategies to implement during crucial conversations to help improve attention following education with min assistance. Discussed possible factors that may be attributing to pt's episodes of memory lapse. For HEP, pt tasked with journaling episodes of memory lapse to help better understand pt's current difficulty.       PATIENT EDUCATION: Education details: as above  Person educated: Patient Education method: Chief Technology Officer Education comprehension: verbalized understanding and recollection of strategies for use in daily life  HOME EXERCISE PROGRAM:        Pt tasked with journaling episodes of memory lapse with specific information including date, time, location, etc.     GOALS:  Goals reviewed with patient? Yes  SHORT TERM GOALS: Target date: 10 sessions  Patient will implement compensations for memory and attention in 5 minutes conversation or listening activity to recall 80% of details with use of external aid.  Baseline: Goal status: INITIAL   2.  Patient will identify x3 compensations to improve functional attention and memory x2 sessions.  Baseline:  Goal status: INITIAL    LONG TERM GOALS: Target date: 04/02/23  Patient will use compensations for memory and attention for 15 minutes during mod complex conversation or healthcare visit to recall 80% of details with external aid.  Baseline:  Goal status: INITIAL  2.  Patient will report use of attention and memory compensations for "critical" conversations at home. Baseline:  Goal status: INITIAL   ASSESSMENT:  CLINICAL IMPRESSION:  Patient is a 77  y.o. female who was seen today for cognitive-communication evaluation due to cognitive deficits following stroke in January 2023. Pt continues to present with cognitive-linguistic deficits mainly affecting attention and memory (working, short term).  See details of treatment session above. I recommend continued course of skilled ST for training in  strategies to manage cognitive impairments in order to improve function and quality of life.   OBJECTIVE IMPAIRMENTS include attention and memory. These impairments are limiting patient's QoL and participation in conversations in home environment. Factors affecting potential to achieve goals and functional outcome are  medical diagnosis Patient will benefit from skilled SLP services to address above impairments and improve overall function.  REHAB POTENTIAL: Good  PLAN: SLP FREQUENCY: 1-2x/week  SLP DURATION: 12 weeks  PLANNED INTERVENTIONS: Internal/external aids, Multimodal communication approach, SLP instruction and feedback, Compensatory strategies, Patient/family education, and Re-evaluation    Clyde Canterbury, M.S., CCC-SLP Speech-Language Pathologist   [facility] at [facility] [address] [place], [place], [address] Phone: [phone]   Fax:  [phone]

## 2023-01-22 ENCOUNTER — Ambulatory Visit: Payer: Medicare HMO | Admitting: Speech Pathology

## 2023-02-02 ENCOUNTER — Encounter: Payer: Self-pay | Admitting: Internal Medicine

## 2023-02-02 ENCOUNTER — Encounter: Payer: Self-pay | Admitting: Pulmonary Disease

## 2023-02-02 NOTE — Telephone Encounter (Signed)
Anita, please advise. Thanks 

## 2023-02-03 MED ORDER — NITROFURANTOIN MONOHYD MACRO 100 MG PO CAPS: 100.0000 mg | ORAL_CAPSULE | Freq: Two times a day (BID) | ORAL | 0 refills | Status: DC

## 2023-02-03 NOTE — Telephone Encounter (Signed)
Confirm she has taken macrobid (nitrofurantoin) previously and no problems taking.  If this is correct, then can call in macrobid 100mg  bid x 5 days to take with her.  Let us now if any problems.

## 2023-02-03 NOTE — Telephone Encounter (Signed)
I have spoke with Alexis Reed and her CT has been scheduled on 03/10/23 @ 11:30am at [facility]. She is aware of the appt and location

## 2023-02-10 ENCOUNTER — Ambulatory Visit (INDEPENDENT_AMBULATORY_CARE_PROVIDER_SITE_OTHER): Payer: Medicare HMO

## 2023-02-10 DIAGNOSIS — M8580 Other specified disorders of bone density and structure, unspecified site: Secondary | ICD-10-CM

## 2023-02-10 MED ORDER — DENOSUMAB 60 MG/ML ~~LOC~~ SOSY
60.0000 mg | PREFILLED_SYRINGE | Freq: Once | SUBCUTANEOUS | Status: AC
  Administered 2023-02-10: 60 mg via SUBCUTANEOUS

## 2023-02-10 NOTE — Progress Notes (Signed)
Patient arrived for a Prolia injection and it was administered into her right arm SQ. Patient tolerated the injection well and did not show any signs of distress or voice any concerns.

## 2023-02-11 ENCOUNTER — Ambulatory Visit: Payer: Medicare HMO | Admitting: Speech Pathology

## 2023-02-11 DIAGNOSIS — R41841 Cognitive communication deficit: Secondary | ICD-10-CM

## 2023-02-11 NOTE — Therapy (Signed)
OUTPATIENT SPEECH LANGUAGE PATHOLOGY  COGNITIVE-COMMUNICATION TREATMENT   Patient Name: Alexis Reed MRN: [id number] DOB:[birth year], 77 y.o., female Today's Date: 02/11/2023  PCP: Lorin Picket, MD REFERRING PROVIDER: Sherryll Burger, MD   End of Session - 02/11/23 1400     Visit Number 3    Number of Visits 25    Date for SLP Re-Evaluation 04/02/23    SLP Start Time 1315    SLP Stop Time  1400    SLP Time Calculation (min) 45 min    Activity Tolerance Patient tolerated treatment well             Patient Active Problem List   Diagnosis Date Noted   Compression fracture of T12 vertebra (HCC) 08/26/2022   Low back pain 05/27/2022   Anemia 11/02/2021   History of total knee arthroplasty 09/15/2021   B12 deficiency 09/15/2021   History of CVA (cerebrovascular accident) 08/18/2021   Edema of right lower leg 08/02/2021   Acute CVA (cerebrovascular accident) (HCC) 08/02/2021   AMS (altered mental status) 08/01/2021   Pre-op evaluation 07/24/2021   Lung nodules 07/24/2021   Dyspepsia    Burping    Polyp of cecum    Polyp of ascending colon    Vitamin D deficiency 05/11/2021   Increased heart rate 05/10/2021   Breast cancer screening 01/07/2021   Ear anomaly 07/18/2020   Complex tear of medial meniscus of right knee as current injury 06/22/2020   Breast nodule 06/10/2020   Thyroid nodule 05/29/2020   Asymmetry of clavicles 05/07/2020   Complex tear of lateral meniscus of right knee as current injury 04/30/2020   Primary osteoarthritis of right knee 04/30/2020   Leg wound, right 01/16/2020   Laceration of skin of knee 01/10/2020   Palpitations 10/10/2019   Breast pain 10/10/2019   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   Side pain 08/22/2018   History of colonic polyps 02/04/2016   Personal history of tobacco use, presenting hazards to health 08/07/2015   Health care maintenance 11/18/2014   Breast calcifications on mammogram 10/18/2014   Obesity 07/23/2014   COPD (chronic  obstructive pulmonary disease) (HCC) 04/26/2014   Tobacco use disorder 04/26/2014   Abnormal mammogram 04/12/2014   UTI (urinary tract infection) 04/02/2014   SOB (shortness of breath) 02/25/2014   Stress 02/25/2014   Internal hemorrhoids without complication 09/24/2013   Osteopenia 06/18/2012   GERD (gastroesophageal reflux disease) 06/18/2012   Hypercholesterolemia 06/18/2012    ONSET DATE:  08/01/21 (stroke); 11/24/22 (referral date)  REFERRING DIAG: cognitive deficit; post stroke  THERAPY DIAG:  Cognitive communication deficit  Rationale for Evaluation and Treatment Rehabilitation  SUBJECTIVE:   SUBJECTIVE STATEMENT: Pt pleasant and cooperative. "I lose conversations." Pt accompanied by: self  PERTINENT HISTORY:  Pt is a 77 y.o. female s/p stroke in January 2023. MRI showed abnormal restricted diffusion involving the central and left aspect of the anterior genu of the corpus callosum, with extension to involve the fornices bilaterally. Finding likely reflects changes of an acute ACA distribution infarct. PMH: arthritis, COPD, osteoporosis. Pt had course of HH SLP, but endorses continued difficulty with memory.    PAIN:  Are you having pain? No   FALLS: Has patient fallen in last 6 months?  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 Pt able to identify x2 personally relevant active listening strategies to implement in discussions with family. Pt brought in log of memory lapses over the last 20 days, x4 situations logged which appeared to be mainly passing conversations. For HEP, pt tasked with continuing to  journal episodes of memory lapse to help better understand pt's current difficulty.       PATIENT EDUCATION: Education details: as above  Person educated: Patient Education method: Chief Technology Officer Education comprehension: verbalized understanding and recollection of strategies for use in daily life  HOME EXERCISE PROGRAM:        Pt tasked with journaling episodes of memory lapse with specific information including date, time, location, etc.; implementation of active listening strategies    GOALS:  Goals reviewed with patient? Yes  SHORT TERM GOALS: Target date: 10 sessions  Patient will implement compensations for memory and attention in 5 minutes conversation or listening activity to recall 80% of details with use of external aid.  Baseline: Goal status: INITIAL   2.  Patient will identify x3 compensations to improve functional attention and memory x2 sessions.  Baseline:  Goal status: INITIAL    LONG TERM GOALS: Target date: 04/02/23  Patient will use compensations for memory and attention for 15 minutes during mod complex conversation or healthcare visit to recall 80% of details with external aid.  Baseline:  Goal status: INITIAL  2.  Patient will report use of attention and memory compensations for "critical" conversations at home. Baseline:  Goal status: INITIAL   ASSESSMENT:  CLINICAL IMPRESSION:  Patient is a 77  y.o. female who was seen today for cognitive-communication evaluation due to cognitive deficits following stroke in January 2023. Pt continues to present with cognitive-linguistic deficits mainly affecting attention and memory (working, short term).  See details of treatment session above. I recommend continued course of skilled ST for training in strategies to manage cognitive impairments in order to improve function and quality of life.   OBJECTIVE IMPAIRMENTS include attention and memory. These impairments are limiting patient's QoL and  participation in conversations in home environment. Factors affecting potential to achieve goals and functional outcome are  medical diagnosis Patient will benefit from skilled SLP services to address above impairments and improve overall function.  REHAB POTENTIAL: Good  PLAN: SLP FREQUENCY: 1-2x/week  SLP DURATION: 12 weeks  PLANNED INTERVENTIONS: Internal/external aids, Multimodal communication approach, SLP instruction and feedback, Compensatory strategies, Patient/family education, and Re-evaluation    Clyde Canterbury, M.S., CCC-SLP Speech-Language Pathologist   [facility] Outpatient Rehabilitation at [facility] [address] [place], [place], [address] Phone: [phone]   Fax:  [phone]

## 2023-02-16 ENCOUNTER — Other Ambulatory Visit: Payer: Self-pay | Admitting: Pulmonary Disease

## 2023-02-19 ENCOUNTER — Ambulatory Visit: Payer: Medicare HMO | Attending: Neurology | Admitting: Speech Pathology

## 2023-02-19 ENCOUNTER — Telehealth: Payer: Self-pay

## 2023-02-19 DIAGNOSIS — R41841 Cognitive communication deficit: Secondary | ICD-10-CM | POA: Insufficient documentation

## 2023-02-19 NOTE — Telephone Encounter (Signed)
Pt "no showed" for today's ST appointment. Contacted pt via telephone. Pt stated she wrote Friday, 8/9, on the calendar. Reminded pt of next appointment scheduled for 8/29 at 1100.   Clyde Canterbury, M.S., CCC-SLP Speech-Language Pathologist [place] [phone] (ASCOM)

## 2023-02-26 ENCOUNTER — Encounter (INDEPENDENT_AMBULATORY_CARE_PROVIDER_SITE_OTHER): Payer: Self-pay

## 2023-02-26 ENCOUNTER — Encounter: Payer: Medicare HMO | Admitting: Speech Pathology

## 2023-02-28 ENCOUNTER — Encounter: Payer: Self-pay | Admitting: Pulmonary Disease

## 2023-03-05 ENCOUNTER — Encounter: Payer: Medicare HMO | Admitting: Speech Pathology

## 2023-03-09 ENCOUNTER — Telehealth: Payer: Self-pay | Admitting: Pulmonary Disease

## 2023-03-09 MED ORDER — AZITHROMYCIN 250 MG PO TABS: ORAL_TABLET | ORAL | 0 refills | Status: DC

## 2023-03-09 MED ORDER — METHYLPREDNISOLONE 4 MG PO TBPK: ORAL_TABLET | ORAL | 0 refills | Status: DC

## 2023-03-09 MED ORDER — ALBUTEROL SULFATE HFA 108 (90 BASE) MCG/ACT IN AERS: 2.0000 | INHALATION_SPRAY | Freq: Four times a day (QID) | RESPIRATORY_TRACT | 2 refills | Status: AC | PRN

## 2023-03-09 NOTE — Telephone Encounter (Signed)
Called and spoke to patient.  She stated that she developed a head cold last week. Cold has now moved into her chest C/o Prod cough with green to yellow sputum, increased SOB with exertion and chest congestion x1wk. She recently traveled and flew.  She is using Trelegy once daily. She does not have albuterol.  She has not tested for covid. She will test and call  back with results.   Dr. Jayme Cloud, please advise.

## 2023-03-09 NOTE — Telephone Encounter (Signed)
Per Dr. Jayme Cloud, can send in Albuterol as needed, Medrol and a Zpak.

## 2023-03-09 NOTE — Telephone Encounter (Signed)
Covid test taken today was neg.

## 2023-03-09 NOTE — Telephone Encounter (Signed)
Patient called stating she has had an upper respiratory infection for about a week and a half, coughing, congestion that is now in her head, greenish/yellowish mucus. Uses [facility] in [place]. Denied making a follow up until she is over this and gets her CT scan. Please advise.

## 2023-03-10 ENCOUNTER — Ambulatory Visit: Payer: Medicare HMO

## 2023-03-12 ENCOUNTER — Ambulatory Visit: Payer: Medicare HMO

## 2023-03-12 ENCOUNTER — Other Ambulatory Visit (INDEPENDENT_AMBULATORY_CARE_PROVIDER_SITE_OTHER): Payer: Medicare HMO

## 2023-03-12 DIAGNOSIS — E7800 Pure hypercholesterolemia, unspecified: Secondary | ICD-10-CM

## 2023-03-12 DIAGNOSIS — R41841 Cognitive communication deficit: Secondary | ICD-10-CM

## 2023-03-12 DIAGNOSIS — E559 Vitamin D deficiency, unspecified: Secondary | ICD-10-CM

## 2023-03-12 LAB — BASIC METABOLIC PANEL
BUN: 16 mg/dL (ref 6–23)
CO2: 31 mEq/L (ref 19–32)
Calcium: 9.5 mg/dL (ref 8.4–10.5)
Chloride: 102 mEq/L (ref 96–112)
Creatinine, Ser: 0.72 mg/dL (ref 0.40–1.20)
GFR: 82.02 mL/min (ref >60.00)
Glucose, Bld: 81 mg/dL (ref 70–99)
Potassium: 4.4 mEq/L (ref 3.5–5.1)
Sodium: 141 mEq/L (ref 135–145)

## 2023-03-12 LAB — LIPID PANEL
Cholesterol: 153 mg/dL (ref 0–200)
HDL: 49 mg/dL (ref >39.00)
LDL Cholesterol: 84 mg/dL (ref 0–99)
NonHDL: 103.65
Total CHOL/HDL Ratio: 3
Triglycerides: 98 mg/dL (ref 0.0–149.0)
VLDL: 19.6 mg/dL (ref 0.0–40.0)

## 2023-03-12 LAB — HEPATIC FUNCTION PANEL
ALT: 11 U/L (ref 0–35)
AST: 9 U/L (ref 0–37)
Albumin: 3.8 g/dL (ref 3.5–5.2)
Alkaline Phosphatase: 74 U/L (ref 39–117)
Bilirubin, Direct: 0.1 mg/dL (ref 0.0–0.3)
Total Bilirubin: 0.5 mg/dL (ref 0.2–1.2)
Total Protein: 6.5 g/dL (ref 6.0–8.3)

## 2023-03-12 LAB — VITAMIN D 25 HYDROXY (VIT D DEFICIENCY, FRACTURES): VITD: 37.72 ng/mL (ref 30.00–100.00)

## 2023-03-12 NOTE — Therapy (Signed)
OUTPATIENT SPEECH LANGUAGE PATHOLOGY  COGNITIVE-COMMUNICATION TREATMENT / DISCHARGE SUMMARY   Patient Name: Alexis Reed MRN: [id number] DOB:[birth year], 77 y.o., female Today's Date: 03/12/2023  PCP: Lorin Picket, MD REFERRING PROVIDER: Sherryll Burger, MD   End of Session - 03/12/23 1117     Visit Number 4    Number of Visits 25    Date for SLP Re-Evaluation 04/02/23    SLP Start Time 1054    SLP Stop Time  1118    SLP Time Calculation (min) 24 min             Patient Active Problem List   Diagnosis Date Noted   Compression fracture of T12 vertebra (HCC) 08/26/2022   Low back pain 05/27/2022   Anemia 11/02/2021   History of total knee arthroplasty 09/15/2021   B12 deficiency 09/15/2021   History of CVA (cerebrovascular accident) 08/18/2021   Edema of right lower leg 08/02/2021   Acute CVA (cerebrovascular accident) (HCC) 08/02/2021   AMS (altered mental status) 08/01/2021   Pre-op evaluation 07/24/2021   Lung nodules 07/24/2021   Dyspepsia    Burping    Polyp of cecum    Polyp of ascending colon    Vitamin D deficiency 05/11/2021   Increased heart rate 05/10/2021   Breast cancer screening 01/07/2021   Ear anomaly 07/18/2020   Complex tear of medial meniscus of right knee as current injury 06/22/2020   Breast nodule 06/10/2020   Thyroid nodule 05/29/2020   Asymmetry of clavicles 05/07/2020   Complex tear of lateral meniscus of right knee as current injury 04/30/2020   Primary osteoarthritis of right knee 04/30/2020   Leg wound, right 01/16/2020   Laceration of skin of knee 01/10/2020   Palpitations 10/10/2019   Breast pain 10/10/2019   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   Side pain 08/22/2018   History of colonic polyps 02/04/2016   Personal history of tobacco use, presenting hazards to health 08/07/2015   Health care maintenance 11/18/2014   Breast calcifications on mammogram 10/18/2014   Obesity 07/23/2014   COPD (chronic obstructive pulmonary disease) (HCC)  04/26/2014   Tobacco use disorder 04/26/2014   Abnormal mammogram 04/12/2014   UTI (urinary tract infection) 04/02/2014   SOB (shortness of breath) 02/25/2014   Stress 02/25/2014   Internal hemorrhoids without complication 09/24/2013   Osteopenia 06/18/2012   GERD (gastroesophageal reflux disease) 06/18/2012   Hypercholesterolemia 06/18/2012    ONSET DATE:  08/01/21 (stroke); 11/24/22 (referral date)  REFERRING DIAG: cognitive deficit; post stroke  THERAPY DIAG:  Cognitive communication deficit  Rationale for Evaluation and Treatment Rehabilitation  SUBJECTIVE:   SUBJECTIVE STATEMENT: Pt pleasant and cooperative. "I'm doing much better with using my strategies." Pt accompanied by: self  PERTINENT HISTORY:  Pt is a 77 y.o. female s/p stroke in January 2023. MRI showed abnormal restricted diffusion involving the central and left aspect of the anterior genu of the corpus callosum, with extension to involve the fornices bilaterally. Finding likely reflects changes of an acute ACA distribution infarct. PMH: arthritis, COPD, osteoporosis. Pt had course of HH SLP, but endorses continued difficulty with memory.    PAIN:  Are you having pain? No   FALLS: Has patient fallen in last 6 months?  Yes  LIVING ENVIRONMENT: Lives with: lives with their spouse Lives in: House/apartment  PLOF:  Level of assistance: Independent with ADLs Employment: Other: works occasionally for the [employer], sport and exercise; retired from [employer]   PATIENT GOALS   to figure out a way to improve  memory  OBJECTIVE:   TODAY'S TREATMENT: Skilled education provided attention, memory, and active listening strategies. Pt able to recall >x5 personally relevant strategies. Pt endorses successful implementation of strategies during critical conversations and every day interactions with friends and family in a variety of settings (e.g. at home, restaurant, on vacation). Pt reports husband commenting on improve  functional attention and memory.      PATIENT EDUCATION: Education details: as above  Person educated: Patient Education method: Chief Technology Officer Education comprehension: verbalized understanding and recollection of strategies for use in daily life  HOME EXERCISE PROGRAM:        N/A    GOALS:  Goals reviewed with patient? Yes  SHORT TERM GOALS: Target date: 10 sessions  Patient will implement compensations for memory and attention in 5 minutes conversation or listening activity to recall 80% of details with use of external aid.  Baseline: Goal status: MET  2.  Patient will identify x3 compensations to improve functional attention and memory x2 sessions.  Baseline:  Goal status: MET    LONG TERM GOALS: Target date: 04/02/23  Patient will use compensations for memory and attention for 15 minutes during mod complex conversation or healthcare visit to recall 80% of details with external aid.  Baseline:  Goal status: MET - per pt report  2.  Patient will report use of attention and memory compensations for "critical" conversations at home. Baseline:  Goal status: MET   ASSESSMENT:  CLINICAL IMPRESSION:  Patient is a 77  y.o. female who was seen today for cognitive-communication evaluation due to cognitive deficits following stroke in January 2023. Pt continues to present with cognitive-linguistic deficits mainly affecting attention and memory (working, short term).  See details of treatment session above. Pt has met all SLP goals and is now d/c'd from SLP services.   OBJECTIVE IMPAIRMENTS include attention and memory. These impairments are limiting patient's QoL and participation in conversations in home environment. Factors affecting potential to achieve goals and functional outcome are  medical diagnosis Patient will benefit from skilled SLP services to address above impairments and improve overall function.  REHAB POTENTIAL: Good  PLAN: D/C SLP  services    Clyde Canterbury, M.S., CCC-SLP Speech-Language Pathologist   [facility] Rehabilitation at [facility] [address] [place], [place], [address] Phone: [phone]   Fax:  [phone]

## 2023-03-18 ENCOUNTER — Encounter: Payer: Self-pay | Admitting: Internal Medicine

## 2023-03-18 ENCOUNTER — Ambulatory Visit (INDEPENDENT_AMBULATORY_CARE_PROVIDER_SITE_OTHER): Payer: Medicare HMO | Admitting: Internal Medicine

## 2023-03-18 VITALS — BP 118/74 | HR 83 | Temp 97.9°F | Ht 63.0 in | Wt 175.6 lb

## 2023-03-18 DIAGNOSIS — E041 Nontoxic single thyroid nodule: Secondary | ICD-10-CM

## 2023-03-18 DIAGNOSIS — E7800 Pure hypercholesterolemia, unspecified: Secondary | ICD-10-CM | POA: Diagnosis not present

## 2023-03-18 DIAGNOSIS — K219 Gastro-esophageal reflux disease without esophagitis: Secondary | ICD-10-CM

## 2023-03-18 DIAGNOSIS — Z8673 Personal history of transient ischemic attack (TIA), and cerebral infarction without residual deficits: Secondary | ICD-10-CM

## 2023-03-18 DIAGNOSIS — D649 Anemia, unspecified: Secondary | ICD-10-CM

## 2023-03-18 DIAGNOSIS — F439 Reaction to severe stress, unspecified: Secondary | ICD-10-CM

## 2023-03-18 DIAGNOSIS — E559 Vitamin D deficiency, unspecified: Secondary | ICD-10-CM

## 2023-03-18 DIAGNOSIS — S22080S Wedge compression fracture of T11-T12 vertebra, sequela: Secondary | ICD-10-CM | POA: Diagnosis not present

## 2023-03-18 DIAGNOSIS — J449 Chronic obstructive pulmonary disease, unspecified: Secondary | ICD-10-CM | POA: Diagnosis not present

## 2023-03-18 DIAGNOSIS — Z86010 Personal history of colonic polyps: Secondary | ICD-10-CM

## 2023-03-18 NOTE — Progress Notes (Signed)
Subjective:    Patient ID: Alexis Reed, female    DOB: [birth year], 77 y.o.   MRN: [id number]  Patient here for  Chief Complaint  Patient presents with   Medication Management    HPI Here for follow up regarding history of CVA, sleep apnea and hypercholesterolemia. Saw Dr Jayme Cloud - 08/05/22 - f/u lung nodule.  Started on trelegy. Had PET scan which revealed right upper lobe pulmonary nodule - stable from recent studies, but decreased in size from more remote priors - favored to reflect a resolving infectious/inflammatory process.  States recommended f/u in 6 months. Continue inhaled corticosteroids and LABA.  Has been seeing ortho- compression fractures.  MRI of the lumbar spine shows L4-5 spondylolisthesis. She has compression fracture at T10 and T11 superior end plates, then W09 as well. Essentially compression fractures at T10 through L1, L3, L5.  Went to PT.  Helped.  Receiving prolia. Last evaluated 02/11/23. Was having issues with left hip bursitis - s/p injection. Helped. Ultram prn. Had f/u with neurology - 11/24/22 - s/p previous acute infarct.  Recommended continuing aspirin daily.  Declines cpap. Called into pulmonary 03/09/23 - reported cough and congestion. Symptoms started with head cold and moved into her chest.  Was treated with zpak, steroids and albuterol inhaler.  Continued trelegy.  Breathing is better. Cough is better.  Just minimal residual cough.  No chest pain or sob reported.  No abdominal pain.  Bowels stable. Still some back pain.  Is overall better.  Yesterday pm - had pain - radiated around upper abdomen -under right ribs.  No pain now. Overall feels things are stable.    Past Medical History:  Diagnosis Date   Arthritis    Benign breast lumps    multiple lumps biopsy and remove x's 5 last being 1979 by Dr. Okey Dupre   COPD (chronic obstructive pulmonary disease) (HCC)    MILD-RARELY EVER HAS TO USE COMBIVENT INHALER   Fibroids    s/p hysterectomy   GERD  (gastroesophageal reflux disease)    Hypercholesterolemia    Osteoporosis    Personal history of tobacco use, presenting hazards to health 08/07/2015   Urinary tract infection    Past Surgical History:  Procedure Laterality Date   ABDOMINAL HYSTERECTOMY  1997   with bilateral oophorectomy   BREAST BIOPSY Left    multiple done   BREAST BIOPSY Right 2016   stereo- neg. FC changes   BREAST EXCISIONAL BIOPSY Right 1975   multiple biopsies done   BREAST SURGERY Left    lump removed. Unsure of date   BREAST SURGERY Right    lump removed. Unsure of date   COLONOSCOPY  2010   Dr. Mechele Collin   COLONOSCOPY WITH PROPOFOL N/A 06/17/2021   Procedure: COLONOSCOPY WITH PROPOFOL;  Surgeon: Toney Reil, MD;  Location: [facility] ENDOSCOPY;  Service: Gastroenterology;  Laterality: N/A;   ESOPHAGOGASTRODUODENOSCOPY (EGD) WITH PROPOFOL  06/17/2021   Procedure: ESOPHAGOGASTRODUODENOSCOPY (EGD) WITH PROPOFOL;  Surgeon: Toney Reil, MD;  Location: [facility] ENDOSCOPY;  Service: Gastroenterology;;   KNEE ARTHROSCOPY WITH LATERAL MENISECTOMY Right 05/08/2020   Procedure: RIGHT KNEE ARTHROSCOPY WITH DEBRIDEMENT AND PARTIAL LATERAL MENISCECTOMY;  Surgeon: Christena Flake, MD;  Location: [facility] ORS;  Service: Orthopedics;  Laterality: Right;   LAPAROSCOPIC APPENDECTOMY N/A 12/21/2018   Procedure: APPENDECTOMY LAPAROSCOPIC CONVERTED TO OPEN;  Surgeon: Sung Amabile, DO;  Location: [facility] ORS;  Service: General;  Laterality: N/A;   TONSILLECTOMY     Family History  Problem Relation Age  of Onset   Hyperlipidemia Mother    Thyroid disease Mother    Hypertension Mother    Urinary tract infection Mother    Colon cancer Father    Stroke Father    Heart disease Father        myocardial infarction   Breast cancer Neg Hx    Social History   Socioeconomic History   Marital status: Married    Spouse name: Not on file   Number of children: 2   Years of education: Not on file   Highest education level: Not on file   Occupational History   Not on file  Tobacco Use   Smoking status: Former    Current packs/day: 0.00    Average packs/day: 1 pack/day for 30.0 years (30.0 ttl pk-yrs)    Types: Cigarettes    Start date: 04/26/1974    Quit date: 04/26/2004    Years since quitting: 18.9    Passive exposure: Past   Smokeless tobacco: Never  Vaping Use   Vaping status: Never Used  Substance and Sexual Activity   Alcohol use: Yes    Alcohol/week: 0.0 standard drinks of alcohol    Comment: occassional wine every other day   Drug use: No   Sexual activity: Yes    Birth control/protection: Post-menopausal  Other Topics Concern   Not on file  Social History Narrative   She is married, has two children. 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   Social Determinants of Health   Financial Resource Strain: Low Risk  (11/01/2022)   Overall Financial Resource Strain (CARDIA)    Difficulty of Paying Living Expenses: Not hard at all  Food Insecurity: No Food Insecurity (11/01/2022)   Hunger Vital Sign    Worried About Running Out of Food in the Last Year: Never true    Ran Out of Food in the Last Year: Never true  Transportation Needs: No Transportation Needs (11/01/2022)   PRAPARE - Administrator, Civil Service (Medical): No    Lack of Transportation (Non-Medical): No  Physical Activity: Insufficiently Active (11/01/2022)   Exercise Vital Sign    Days of Exercise per Week: 1 day    Minutes of Exercise per Session: 20 min  Stress: No Stress Concern Present (11/01/2022)   [employer] of Occupational Health - Occupational Stress Questionnaire    Feeling of Stress : Not at all  Social Connections: Socially Integrated (11/01/2022)   Social Connection and Isolation Panel [NHANES]    Frequency of Communication with Friends and Family: More than three times a week    Frequency of Social Gatherings with Friends and Family: Twice a week    Attends Religious Services: More than 4 times per  year    Active Member of [employer] or Organizations: Yes    Attends Banker Meetings: 1 to 4 times per year    Marital Status: Married     Review of Systems  Constitutional:  Negative for appetite change and unexpected weight change.  HENT:  Negative for congestion and sinus pressure.   Respiratory:  Negative for chest tightness and shortness of breath.        Minimal cough.   Cardiovascular:  Negative for chest pain, palpitations and leg swelling.  Gastrointestinal:  Negative for abdominal pain, diarrhea, nausea and vomiting.  Genitourinary:  Negative for difficulty urinating and dysuria.  Musculoskeletal:  Negative for joint swelling and myalgias.  Skin:  Negative for color change and rash.  Neurological:  Negative for dizziness and headaches.  Psychiatric/Behavioral:  Negative for agitation and dysphoric mood.        Objective:     BP 118/74   Pulse 83   Temp 97.9 F (36.6 C) (Oral)   Ht 5\' 3"  (1.6 m)   Wt 175 lb 9.6 oz (79.7 kg)   LMP 07/20/1995   SpO2 97%   BMI 31.11 kg/m  Wt Readings from Last 3 Encounters:  03/18/23 175 lb 9.6 oz (79.7 kg)  11/05/22 178 lb (80.7 kg)  08/05/22 181 lb 6.4 oz (82.3 kg)    Physical Exam Vitals reviewed.  Constitutional:      General: She is not in acute distress.    Appearance: Normal appearance.  HENT:     Head: Normocephalic and atraumatic.     Right Ear: External ear normal.     Left Ear: External ear normal.  Eyes:     General: No scleral icterus.       Right eye: No discharge.        Left eye: No discharge.     Conjunctiva/sclera: Conjunctivae normal.  Neck:     Thyroid: No thyromegaly.  Cardiovascular:     Rate and Rhythm: Normal rate and regular rhythm.  Pulmonary:     Effort: No respiratory distress.     Breath sounds: Normal breath sounds. No wheezing.  Abdominal:     General: Bowel sounds are normal.     Palpations: Abdomen is soft.     Tenderness: There is no abdominal tenderness.   Musculoskeletal:        General: No swelling or tenderness.     Cervical back: Neck supple. No tenderness.  Lymphadenopathy:     Cervical: No cervical adenopathy.  Skin:    Findings: No erythema or rash.  Neurological:     Mental Status: She is alert.  Psychiatric:        Mood and Affect: Mood normal.        Behavior: Behavior normal.      Outpatient Encounter Medications as of 03/18/2023  Medication Sig   acetaminophen (TYLENOL) 500 MG tablet Take 1,000 mg by mouth every 6 (six) hours as needed for moderate pain.   albuterol (VENTOLIN HFA) 108 (90 Base) MCG/ACT inhaler Inhale 2 puffs into the lungs every 6 (six) hours as needed for wheezing or shortness of breath.   aspirin EC 81 MG EC tablet Take 1 tablet (81 mg total) by mouth daily. Swallow whole.   azithromycin (ZITHROMAX) 250 MG tablet Take 2 tablets (500 mg) on  Day 1,  followed by 1 tablet (250 mg) once daily on Days 2 through 5.   Calcium Carb-Cholecalciferol (CALCIUM 500 +D) 500-400 MG-UNIT TABS Take 1 tablet by mouth daily.   Cholecalciferol (VITAMIN D) 2000 UNITS tablet Take 2,000 Units by mouth daily.   Cranberry 450 MG CAPS Take 450 mg by mouth daily.   cyclobenzaprine (FLEXERIL) 5 MG tablet Take 5 mg by mouth as needed.   ibuprofen (ADVIL) 800 MG tablet Take 800 mg by mouth 3 (three) times daily.   methocarbamol (ROBAXIN) 500 MG tablet Take 1 tablet by mouth every 6 (six) hours as needed.   methylPREDNISolone (MEDROL DOSEPAK) 4 MG TBPK tablet Take as directed on the package.   nitrofurantoin, macrocrystal-monohydrate, (MACROBID) 100 MG capsule Take 1 capsule (100 mg total) by mouth 2 (two) times daily.   omeprazole (PRILOSEC) 20 MG capsule TAKE 1 CAPSULE BY MOUTH ONCE DAILY   pravastatin (PRAVACHOL) 80  MG tablet Take 1 tablet (80 mg total) by mouth daily.   TRELEGY ELLIPTA 100-62.5-25 MCG/ACT AEPB USE ONE INHALATION INTO THE LUNGS ONCE A DAY RINSE MOUTH AFTER EACH USE   No facility-administered encounter medications  on file as of 03/18/2023.     Lab Results  Component Value Date   WBC 5.4 07/23/2022   HGB 14.1 07/23/2022   HCT 42.0 07/23/2022   PLT 308.0 07/23/2022   GLUCOSE 81 03/12/2023   CHOL 153 03/12/2023   TRIG 98.0 03/12/2023   HDL 49.00 03/12/2023   LDLDIRECT 153.0 07/17/2015   LDLCALC 84 03/12/2023   ALT 11 03/12/2023   AST 9 03/12/2023   NA 141 03/12/2023   K 4.4 03/12/2023   CL 102 03/12/2023   CREATININE 0.72 03/12/2023   BUN 16 03/12/2023   CO2 31 03/12/2023   TSH 1.73 07/23/2022   INR 0.9 07/23/2021   HGBA1C 5.5 08/02/2021    NM PET Image Initial (PI) Skull Base To Thigh (F-18 FDG)  Result Date: 08/27/2022 CLINICAL DATA:  Initial treatment strategy for pulmonary nodule. EXAM: NUCLEAR MEDICINE PET SKULL BASE TO THIGH TECHNIQUE: 10.32 mCi F-18 FDG was injected intravenously. Full-ring PET imaging was performed from the skull base to thigh after the radiotracer. CT data was obtained and used for attenuation correction and anatomic localization. Fasting blood glucose: 90 mg/dl COMPARISON:  CT abdomen pelvis August 12, 2022 and chest CT May 20, 2022 FINDINGS: Mediastinal blood pool activity: SUV max 1.8 Liver activity: SUV max NA NECK: No hypermetabolic cervical adenopathy. Incidental CT findings: [place] non hypermetabolic 1.8 cm nodule posterior to the right thyroid is stable and has been previously evaluated on prior thyroid ultrasound June 19, 2020 CHEST: Minimal FDG avidity in a right upper lobe pulmonary nodule, similar to background mediastinal blood pool, which measures 8 mm on image 71/2 with a max SUV of 1.9. This previously measured 16 x 11 mm. No hypermetabolic thoracic adenopathy. Incidental CT findings: Biapical pleuroparenchymal scarring. Centrilobular and paraseptal emphysema. Scattered areas of atelectasis/scarring. Aortic atherosclerosis. Coronary artery calcifications. Patulous esophagus. ABDOMEN/PELVIS: No abnormal hypermetabolic activity within the liver,  pancreas, adrenal glands, or spleen. No hypermetabolic lymph nodes in the abdomen or pelvis. Incidental CT findings: Aortic atherosclerosis. SKELETON: No focal hypermetabolic activity to suggest skeletal metastasis. Incidental CT findings: No FDG avidity about the multilevel compression deformities at T10, T11, L1, L3 and L5. Diffuse demineralization of bone. IMPRESSION: 1. Minimal FDG avidity in the 8 mm right upper lobe pulmonary nodule, similar intensity to background mediastinal blood pool, which is stable in size from recent studies but decreased in size from more remote priors 16 x 11 mm, favored to reflect a resolving infectious/inflammatory process. However this warrants continued attention on follow-up chest CT. 2. No FDG avidity about the multilevel compression deformities at T10, T11, L1, L3 and L5. 3.  Aortic Atherosclerosis (ICD10-I70.0). Electronically Signed   By: Maudry Mayhew M.D.   On: 08/27/2022 13:55       Assessment & Plan:  Hypercholesterolemia Assessment & Plan: On pravastatin.  Low cholesterol diet and exercise. Follow lipid panel and liver function tests.    Orders: -     Hepatic function panel; Future -     Lipid panel; Future  Anemia, unspecified type Assessment & Plan: Follow cbc.   Orders: -     CBC with Differential/Platelet; Future -     Basic metabolic panel; Future -     TSH; Future  Compression fracture of T12 vertebra, sequela  Assessment & Plan: EMERGE - T 11-12 -  s/p PT - helped.  Continue prolia.    Chronic obstructive pulmonary disease, unspecified COPD type (HCC) Assessment & Plan: Followed by Dr Belia Heman - abnormal CT RUL nodule - f/u CT scan in 3 months.  Evaluated 03/2022.  Had f/u CT 06/09/22 - Lung nodule -  Increased size of the bilobed nodule in the anterior right upper lobe now measuring 8 x 5 mm. Recommended f/u with pulmonary. Saw Dr Jayme Cloud.  PET as outlined - revealed right upper lobe pulmonary nodule - stable from recent studies, but  decreased in size from more remote priors - favored to reflect a resolving infectious/inflammatory process.  Continue inhaled corticosteroids and LABA. Breathing stable. F/u scan in 6 months. She will reschedule.  Recently treated for cough and congestion - zpak and prednisone.  Better. Continue trelegy.  Albuterol inhaler prn.    Gastroesophageal reflux disease, unspecified whether esophagitis present Assessment & Plan: Continue prilosec.    History of colonic polyps Assessment & Plan: Colonoscopy 06/2021.  Recommended f/u in 3 years.    History of CVA (cerebrovascular accident) Assessment & Plan: Found to have acute corpus callosum infarct.  On plavix and aspirin.  Symptoms improved.  Seeing Dr Sherryll Burger. Continue aspirin daily and pravastatin.    Stress Assessment & Plan: Overall appears to be handling things well.  Follow.    Thyroid nodule Assessment & Plan: Saw endocrinology 07/28/22.  Recommended f/u ultrasound and TSH in 12 months.     Vitamin D deficiency Assessment & Plan: Vitamin D level 03/12/23 - wnl.       Dale Pablo, MD

## 2023-03-22 ENCOUNTER — Encounter: Payer: Self-pay | Admitting: Internal Medicine

## 2023-03-22 NOTE — Assessment & Plan Note (Signed)
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  On plavix and aspirin.  Symptoms improved.  Seeing Dr Shah. Continue aspirin daily and pravastatin.  

## 2023-03-22 NOTE — Assessment & Plan Note (Signed)
Follow cbc.  

## 2023-03-22 NOTE — Assessment & Plan Note (Signed)
Followed by Dr Belia Heman - abnormal CT RUL nodule - f/u CT scan in 3 months.  Evaluated 03/2022.  Had f/u CT 06/09/22 - Lung nodule -  Increased size of the bilobed nodule in the anterior right upper lobe now measuring 8 x 5 mm. Recommended f/u with pulmonary. Saw Dr Jayme Cloud.  PET as outlined - revealed right upper lobe pulmonary nodule - stable from recent studies, but decreased in size from more remote priors - favored to reflect a resolving infectious/inflammatory process.  Continue inhaled corticosteroids and LABA. Breathing stable. F/u scan in 6 months. She will reschedule.  Recently treated for cough and congestion - zpak and prednisone.  Better. Continue trelegy.  Albuterol inhaler prn.

## 2023-03-22 NOTE — Assessment & Plan Note (Signed)
EMERGE - T 11-12 -  s/p PT - helped.  Continue prolia.

## 2023-03-22 NOTE — Assessment & Plan Note (Signed)
Saw endocrinology 07/28/22.  Recommended f/u ultrasound and TSH in 12 months.

## 2023-03-22 NOTE — Assessment & Plan Note (Signed)
Colonoscopy 06/2021.  Recommended f/u in 3 years.  

## 2023-03-22 NOTE — Assessment & Plan Note (Signed)
Overall appears to be handling things well.  Follow.  ?

## 2023-03-22 NOTE — Assessment & Plan Note (Signed)
On pravastatin.  Low cholesterol diet and exercise.  Follow lipid panel and liver function tests.   

## 2023-03-22 NOTE — Assessment & Plan Note (Signed)
Vitamin D level 03/12/23 - wnl.

## 2023-03-22 NOTE — Assessment & Plan Note (Signed)
Continue prilosec

## 2023-04-01 ENCOUNTER — Encounter: Payer: Self-pay | Admitting: Pulmonary Disease

## 2023-04-01 NOTE — Telephone Encounter (Signed)
Lets push the CT out 3 to 4 weeks.

## 2023-04-06 ENCOUNTER — Ambulatory Visit: Admission: RE | Admit: 2023-04-06 | Payer: Medicare HMO | Source: Ambulatory Visit

## 2023-04-13 ENCOUNTER — Other Ambulatory Visit: Payer: Self-pay | Admitting: Internal Medicine

## 2023-04-13 DIAGNOSIS — Z1231 Encounter for screening mammogram for malignant neoplasm of breast: Secondary | ICD-10-CM

## 2023-04-30 ENCOUNTER — Encounter: Payer: Self-pay | Admitting: Internal Medicine

## 2023-05-01 ENCOUNTER — Other Ambulatory Visit: Payer: Self-pay

## 2023-05-01 MED ORDER — OMEPRAZOLE 20 MG PO CPDR: 20.0000 mg | DELAYED_RELEASE_CAPSULE | Freq: Every day | ORAL | 3 refills | Status: DC

## 2023-05-04 ENCOUNTER — Ambulatory Visit
Admission: RE | Admit: 2023-05-04 | Discharge: 2023-05-04 | Disposition: A | Discharge status: Home / Self Care | Payer: Medicare HMO | Source: Ambulatory Visit | Attending: Pulmonary Disease | Admitting: Pulmonary Disease

## 2023-05-04 DIAGNOSIS — R911 Solitary pulmonary nodule: Secondary | ICD-10-CM | POA: Insufficient documentation

## 2023-05-05 ENCOUNTER — Ambulatory Visit
Admission: RE | Admit: 2023-05-05 | Discharge: 2023-05-05 | Disposition: A | Discharge status: Home / Self Care | Payer: Medicare HMO | Source: Ambulatory Visit | Attending: Internal Medicine | Admitting: Internal Medicine

## 2023-05-05 DIAGNOSIS — Z1231 Encounter for screening mammogram for malignant neoplasm of breast: Secondary | ICD-10-CM | POA: Insufficient documentation

## 2023-05-06 ENCOUNTER — Other Ambulatory Visit: Payer: Self-pay | Admitting: Internal Medicine

## 2023-05-06 DIAGNOSIS — R928 Other abnormal and inconclusive findings on diagnostic imaging of breast: Secondary | ICD-10-CM

## 2023-05-06 NOTE — Progress Notes (Signed)
Orders placed for f/u left breast mammogram and ultrasound 

## 2023-05-08 ENCOUNTER — Telehealth: Payer: Self-pay

## 2023-05-08 NOTE — Telephone Encounter (Signed)
-----   Message from McBride sent at 05/06/2023 11:53 PM EDT ----- Notify - I reviewed her mammogram result and radiology is recommending a f/u left breast mammogram and possible ultrasound to further evaluate.  Order has been placed. Someone should be contacting her with an appt date and time.  If not scheduled, let me know.

## 2023-05-08 NOTE — Telephone Encounter (Signed)
Patient just called back. I read her the message. She said radiology has already contacted her and her appointment is on Monday.

## 2023-05-11 ENCOUNTER — Inpatient Hospital Stay
Admission: RE | Admit: 2023-05-11 | Discharge: 2023-05-11 | Disposition: A | Discharge status: Home / Self Care | Payer: Medicare HMO | Source: Ambulatory Visit | Attending: Internal Medicine | Admitting: Internal Medicine

## 2023-05-11 ENCOUNTER — Ambulatory Visit
Admission: RE | Admit: 2023-05-11 | Discharge: 2023-05-11 | Disposition: A | Discharge status: Home / Self Care | Payer: Medicare HMO | Source: Ambulatory Visit | Attending: Internal Medicine | Admitting: Internal Medicine

## 2023-05-11 DIAGNOSIS — R928 Other abnormal and inconclusive findings on diagnostic imaging of breast: Secondary | ICD-10-CM | POA: Diagnosis present

## 2023-05-12 ENCOUNTER — Ambulatory Visit: Payer: Medicare HMO | Admitting: Pulmonary Disease

## 2023-05-12 ENCOUNTER — Encounter: Payer: Self-pay | Admitting: Pulmonary Disease

## 2023-05-12 ENCOUNTER — Other Ambulatory Visit: Payer: Self-pay | Admitting: Internal Medicine

## 2023-05-12 VITALS — BP 126/80 | HR 76 | Temp 97.5°F | Ht 63.0 in | Wt 184.0 lb

## 2023-05-12 DIAGNOSIS — R911 Solitary pulmonary nodule: Secondary | ICD-10-CM

## 2023-05-12 DIAGNOSIS — R928 Other abnormal and inconclusive findings on diagnostic imaging of breast: Secondary | ICD-10-CM

## 2023-05-12 DIAGNOSIS — N63 Unspecified lump in unspecified breast: Secondary | ICD-10-CM

## 2023-05-12 DIAGNOSIS — J449 Chronic obstructive pulmonary disease, unspecified: Secondary | ICD-10-CM

## 2023-05-12 DIAGNOSIS — Z23 Encounter for immunization: Secondary | ICD-10-CM

## 2023-05-12 NOTE — Patient Instructions (Signed)
VISIT SUMMARY:  Tekoa Marohl, a 77 year old patient with a history of COPD and a lung nodule, visited for a follow-up appointment. She reported feeling well overall, with no new health concerns. She recently recovered from a severe upper respiratory infection and has been managing her COPD effectively with daily Trelegy. A recent CT scan showed that her lung nodule is stable or possibly decreasing in size. She is due fo breathing test and plans to get her flu and RSV vaccinations soon.  YOUR PLAN:  -CHRONIC OBSTRUCTIVE PULMONARY DISEASE (COPD): COPD is a chronic lung condition that makes it hard to breathe. Carolyn's COPD is stable with the daily use of Trelegy, and she has not needed a rescue inhaler recently. She should continue taking Trelegy daily and reschedule her pulmonary function tests to monitor the disease.  -LUNG NODULE: A lung nodule is a small growth in the lung. Carolyn's lung nodule appears stable or slightly smaller on recent imaging. She should continue to follow up as recommended by the radiologist once the official report is available.  -VACCINATIONS: Eber Jones is due for her flu shot, which will be administered today. Due to her age and COPD, she is also advised to get the RSV vaccination in 3-4 weeks at a local pharmacy.  INSTRUCTIONS:  Please reschedule your pulmonary function tests to assess your COPD. Follow up in 4 months. Get your RSV vaccination in 3-4 weeks at a local pharmacy.

## 2023-05-12 NOTE — Progress Notes (Signed)
Subjective:    Patient ID: Alexis Reed, female    DOB: March 28, 1946, 77 y.o.   MRN: [id number]  Patient Care Team: Alexis Tynan, MD as PCP - General (Internal Medicine) Alexis Reed, Alexis Pew, MD (General Surgery) Alexis , MD (Internal Medicine)  Chief Complaint  Patient presents with   Follow-up    DOE. No wheezing or cough.   BACKGROUND/INTERVAL:The patient is a 77 year old former smoker with a 30-pack-year history of smoking and a history as noted below, who presents for follow-up on the issue of a right upper lobe lung nodule. She has been monitored for a right upper lobe nodule that is 8 mm in diameter and appears to be bilobed.  I first evaluated the patient on 17 June 2022 for this nodule.  Send was last seen on 05 August 2022.  PET/CT was obtained in 3 2024 that showed no hypermetabolic activity on the nodule and that the nodule had decreased in size.  Post recent CT chest was obtained 04 May 2023 and nodule appears stable on independent review.  Formal interpretation pending.  She has not had PFTs previously ordered.  HPI Discussed the use of AI scribe software for clinical note transcription with the patient, who gave verbal consent to proceed.  History of Present Illness   Alexis Reed, a 77 year old former smoker with a history of COPD and a lung nodule, presents for follow-up. She reports feeling well overall, with no new health concerns. She recently returned from a trip to [place], during which she developed a severe upper respiratory infection. She sought treatment during the clinic's move and reported significant improvement with the prescribed medication.  She has been managing her COPD with daily Trelegy and has not needed a rescue inhaler for an extended period. She reports no significant shortness of breath in her daily activities, although she acknowledges that more strenuous exertion might present a challenge.  She also mentions a recent CT scan for  a lung nodule. The nodule appears to be stable or possibly decreasing in size, which she finds reassuring. She is awaiting the radiologist's report to determine the next steps for follow-up.  She has not experienced any cough or expectoration. She is due for a breathing test, which was postponed and needs to be rescheduled. She has not yet received her flu shot for the season, which she plans to get during this visit. She also expresses interest in receiving the RSV shot due to her age and COPD, which she plans to get at a pharmacy in a few weeks.     Review of Systems A 10 point review of systems was performed and it is as noted above otherwise negative.   Patient Active Problem List   Diagnosis Date Noted   Compression fracture of T12 vertebra (HCC) 08/26/2022   Low back pain 05/27/2022   Anemia 11/02/2021   History of total knee arthroplasty 09/15/2021   B12 deficiency 09/15/2021   History of CVA (cerebrovascular accident) 08/18/2021   Edema of right lower leg 08/02/2021   Acute CVA (cerebrovascular accident) (HCC) 08/02/2021   AMS (altered mental status) 08/01/2021   Pre-op evaluation 07/24/2021   Lung nodules 07/24/2021   Dyspepsia    Burping    Polyp of cecum    Polyp of ascending colon    Vitamin D deficiency 05/11/2021   Increased heart rate 05/10/2021   Breast cancer screening 01/07/2021   Ear anomaly 07/18/2020   Complex tear of medial meniscus of right knee as current  injury 06/22/2020   Breast nodule 06/10/2020   Thyroid nodule 05/29/2020   Asymmetry of clavicles 05/07/2020   Complex tear of lateral meniscus of right knee as current injury 04/30/2020   Primary osteoarthritis of right knee 04/30/2020   Leg wound, right 01/16/2020   Laceration of skin of knee 01/10/2020   Palpitations 10/10/2019   Breast pain 10/10/2019   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   Side pain 08/22/2018   History of colonic polyps 02/04/2016   Personal history of tobacco use, presenting  hazards to health 08/07/2015   Health care maintenance 11/18/2014   Breast calcifications on mammogram 10/18/2014   Obesity 07/23/2014   COPD (chronic obstructive pulmonary disease) (HCC) 04/26/2014   Tobacco use disorder 04/26/2014   Abnormal mammogram 04/12/2014   UTI (urinary tract infection) 04/02/2014   SOB (shortness of breath) 02/25/2014   Stress 02/25/2014   Internal hemorrhoids without complication 09/24/2013   Osteopenia 06/18/2012   GERD (gastroesophageal reflux disease) 06/18/2012   Hypercholesterolemia 06/18/2012    Social History   Tobacco Use   Smoking status: Former    Current packs/day: 0.00    Average packs/day: 1 pack/day for 30.0 years (30.0 ttl pk-yrs)    Types: Cigarettes    Start date: 04/26/1974    Quit date: 04/26/2004    Years since quitting: 19.0    Passive exposure: Past   Smokeless tobacco: Never  Substance Use Topics   Alcohol use: Yes    Alcohol/week: 0.0 standard drinks of alcohol    Comment: occassional wine every other day    Allergies  Allergen Reactions   Benzoin Other (See Comments)    After breast surgery After breast surgery   Boniva [Ibandronic Acid] Other (See Comments)    Aching    Codeine Nausea Only   Fosamax [Alendronate Sodium]     Caused aching   Lipitor [Atorvastatin] Hives   Meloxicam Other (See Comments)    GI Upset   Penicillins Swelling    Facial swelling Did it involve swelling of the face/tongue/throat, SOB, or low BP? Yes Did it involve sudden or severe rash/hives, skin peeling, or any reaction on the inside of your mouth or nose? No Did you need to seek medical attention at a hospital or doctor's office? No When did it last happen?      within the last 10 years If all above answers are "NO", may proceed with cephalosporin use.     Current Meds  Medication Sig   acetaminophen (TYLENOL) 500 MG tablet Take 1,000 mg by mouth every 6 (six) hours as needed for moderate pain.   albuterol (VENTOLIN HFA) 108  (90 Base) MCG/ACT inhaler Inhale 2 puffs into the lungs every 6 (six) hours as needed for wheezing or shortness of breath.   aspirin EC 81 MG EC tablet Take 1 tablet (81 mg total) by mouth daily. Swallow whole.   Calcium Carb-Cholecalciferol (CALCIUM 500 +D) 500-400 MG-UNIT TABS Take 1 tablet by mouth daily.   Cholecalciferol (VITAMIN D) 2000 UNITS tablet Take 2,000 Units by mouth daily.   Cranberry 450 MG CAPS Take 450 mg by mouth daily.   cyclobenzaprine (FLEXERIL) 5 MG tablet Take 5 mg by mouth as needed.   denosumab (PROLIA) 60 MG/ML SOSY injection Inject 60 mg into the skin every 6 (six) months.   ibuprofen (ADVIL) 800 MG tablet Take 800 mg by mouth 3 (three) times daily.   methocarbamol (ROBAXIN) 500 MG tablet Take 1 tablet by mouth every 6 (six) hours as  needed.   nitrofurantoin, macrocrystal-monohydrate, (MACROBID) 100 MG capsule Take 1 capsule (100 mg total) by mouth 2 (two) times daily.   omeprazole (PRILOSEC) 20 MG capsule Take 1 capsule (20 mg total) by mouth daily.   pravastatin (PRAVACHOL) 80 MG tablet Take 1 tablet (80 mg total) by mouth daily.   TRELEGY ELLIPTA 100-62.5-25 MCG/ACT AEPB USE ONE INHALATION INTO THE LUNGS ONCE A DAY RINSE MOUTH AFTER EACH USE    Immunization History  Administered Date(s) Administered   Fluad Quad(high Dose 65+) 04/13/2020, 05/10/2021, 05/28/2022   Influenza Split 05/12/2012, 04/11/2013   Influenza, High Dose Seasonal PF 04/28/2017, 05/10/2018   Influenza-Unspecified 05/18/2014, 05/15/2015   PFIZER Comirnaty(Gray Top)Covid-19 Tri-Sucrose Vaccine 08/05/2019, 08/26/2019, 04/27/2020, 05/07/2021   PFIZER(Purple Top)SARS-COV-2 Vaccination 08/05/2019, 08/26/2019, 04/27/2020   Pneumococcal Conjugate-13 07/29/2017   Pneumococcal Polysaccharide-23 04/26/2014   Zoster, Live 06/13/2013        Objective:     BP 126/80 (BP Location: Right Arm, Cuff Size: Normal)   Pulse 76   Temp (!) 97.5 F (36.4 C)   Ht 5\' 3"  (1.6 m)   Wt 184 lb (83.5 kg)    LMP 07/20/1995   SpO2 97%   BMI 32.59 kg/m   SpO2: 97 % O2 Device: None (Room air)  GENERAL: Well-developed, well-nourished woman, no acute distress, fully ambulatory, no conversational dyspnea. HEAD: Normocephalic, atraumatic.  EYES: Pupils equal, round, reactive to light.  No scleral icterus.  MOUTH: Dentition intact, no thrush. NECK: Supple. No thyromegaly. Trachea midline. No JVD.  No adenopathy. PULMONARY: Good air entry bilaterally.  Coarse, otherwise, no adventitious sounds. CARDIOVASCULAR: S1 and S2. Regular rate and rhythm.  No rubs, murmurs or gallops heard. ABDOMEN: Benign. MUSCULOSKELETAL: No joint deformity, no clubbing, no edema.  NEUROLOGIC: No overt focal deficit, no gait disturbance, speech is fluent. SKIN: Intact,warm,dry. PSYCH: Mood and behavior normal.  Assessment & Plan:     ICD-10-CM   1. Chronic obstructive pulmonary disease, unspecified COPD type (HCC)  J44.9     2. Lung nodule  R91.1     3. Need for influenza vaccination  Z23 Flu Vaccine Trivalent High Dose (Fluad)      Orders Placed This Encounter  Procedures   Flu Vaccine Trivalent High Dose (Fluad)   Discussion:    Chronic Obstructive Pulmonary Disease (COPD) Stable with daily use of Trelegy. No recent use of rescue inhaler. No cough or sputum production. -Continue Trelegy daily. -Reschedule pulmonary function tests to assess disease progression.  Lung Nodule Stable to slightly decreased in size on recent imaging. Awaiting official radiology report. -Continue surveillance as per radiologist's recommendation once report is available.  Vaccinations Flu shot to be administered today. Discussion regarding RSV vaccination due to COPD and age. -Administer flu shot today. -Recommend RSV vaccination in 3-4 weeks at local pharmacy.  Follow-up in 4 months.      Gailen Shelter, MD Advanced Bronchoscopy PCCM [facility]    *This note was generated using voice  recognition software/Dragon and/or AI transcription program.  Despite best efforts to proofread, errors can occur which can change the meaning. Any transcriptional errors that result from this process are unintentional and may not be fully corrected at the time of dictation.

## 2023-05-13 ENCOUNTER — Telehealth: Payer: Self-pay

## 2023-05-13 NOTE — Telephone Encounter (Signed)
Patient states she is returning our call.  I read Dr. Westley Hummer Scott's message to patient.  Patient states they are working on scheduling her biopsy.

## 2023-05-13 NOTE — Telephone Encounter (Signed)
-----   Message from Dale Cedar sent at 05/13/2023  4:52 AM EDT ----- Bethann Berkshire, please call [facility].  It appears that radiology has already discussed her mammogram result with her.  Please notify that I received her mammogram report and radiology is recommending further evaluation with a biopsy.  The order has been signed.  Someone should be contacting her with an appt date and time.  Let us know if not scheduled.  Also, let me know if she has any questions or if I need to talk with her.

## 2023-05-13 NOTE — Telephone Encounter (Signed)
Noted  

## 2023-05-21 ENCOUNTER — Other Ambulatory Visit: Payer: Self-pay

## 2023-05-21 DIAGNOSIS — R911 Solitary pulmonary nodule: Secondary | ICD-10-CM

## 2023-06-02 ENCOUNTER — Ambulatory Visit
Admission: RE | Admit: 2023-06-02 | Discharge: 2023-06-02 | Disposition: A | Discharge status: Home / Self Care | Payer: Medicare HMO | Source: Ambulatory Visit | Attending: Internal Medicine | Admitting: Internal Medicine

## 2023-06-02 DIAGNOSIS — R928 Other abnormal and inconclusive findings on diagnostic imaging of breast: Secondary | ICD-10-CM

## 2023-06-02 DIAGNOSIS — N63 Unspecified lump in unspecified breast: Secondary | ICD-10-CM

## 2023-06-02 DIAGNOSIS — N6032 Fibrosclerosis of left breast: Secondary | ICD-10-CM | POA: Diagnosis present

## 2023-06-02 HISTORY — PX: BREAST BIOPSY: SHX20

## 2023-06-02 MED ORDER — LIDOCAINE HCL (PF) 1 % IJ SOLN
2.0000 mL | Freq: Once | INTRAMUSCULAR | Status: AC
  Administered 2023-06-02: 2 mL
  Filled 2023-06-02: qty 2

## 2023-06-02 MED ORDER — LIDOCAINE-EPINEPHRINE 1 %-1:100000 IJ SOLN
8.0000 mL | Freq: Once | INTRAMUSCULAR | Status: AC
  Administered 2023-06-02: 8 mL
  Filled 2023-06-02: qty 8

## 2023-06-03 ENCOUNTER — Encounter: Payer: Self-pay | Admitting: *Deleted

## 2023-06-03 LAB — SURGICAL PATHOLOGY

## 2023-06-03 NOTE — Progress Notes (Signed)
Referral recieved from [facility] for benign breast mass.  She would like to see Dr. Tonna Boehringer, appt. Scheduled for Tuesday 11/26 at 9:15, waiting for return call to confirm.

## 2023-06-04 ENCOUNTER — Encounter: Payer: Self-pay | Admitting: *Deleted

## 2023-06-04 NOTE — Progress Notes (Signed)
Ms. Florestal confirmed appt with Dr. Tonna Boehringer.  No further needs at this time.

## 2023-06-22 ENCOUNTER — Other Ambulatory Visit (INDEPENDENT_AMBULATORY_CARE_PROVIDER_SITE_OTHER): Payer: Medicare HMO

## 2023-06-22 DIAGNOSIS — E7800 Pure hypercholesterolemia, unspecified: Secondary | ICD-10-CM | POA: Diagnosis not present

## 2023-06-22 DIAGNOSIS — D649 Anemia, unspecified: Secondary | ICD-10-CM

## 2023-06-22 LAB — CBC WITH DIFFERENTIAL/PLATELET
Basophils Absolute: 0 10*3/uL (ref 0.0–0.1)
Basophils Relative: 0.6 % (ref 0.0–3.0)
Eosinophils Absolute: 0.2 10*3/uL (ref 0.0–0.7)
Eosinophils Relative: 3.5 % (ref 0.0–5.0)
HCT: 44.1 % (ref 36.0–46.0)
Hemoglobin: 14.1 g/dL (ref 12.0–15.0)
Lymphocytes Relative: 30.1 % (ref 12.0–46.0)
Lymphs Abs: 1.3 10*3/uL (ref 0.7–4.0)
MCHC: 32 g/dL (ref 30.0–36.0)
MCV: 96.8 fL (ref 78.0–100.0)
Monocytes Absolute: 0.4 10*3/uL (ref 0.1–1.0)
Monocytes Relative: 9.7 % (ref 3.0–12.0)
Neutro Abs: 2.5 10*3/uL (ref 1.4–7.7)
Neutrophils Relative %: 56.1 % (ref 43.0–77.0)
Platelets: 254 10*3/uL (ref 150.0–400.0)
RBC: 4.55 Mil/uL (ref 3.87–5.11)
RDW: 13.9 % (ref 11.5–15.5)
WBC: 4.5 10*3/uL (ref 4.0–10.5)

## 2023-06-22 LAB — TSH: TSH: 1.8 u[IU]/mL (ref 0.35–5.50)

## 2023-06-23 LAB — HEPATIC FUNCTION PANEL
ALT: 16 U/L (ref 0–35)
AST: 19 U/L (ref 0–37)
Albumin: 4.2 g/dL (ref 3.5–5.2)
Alkaline Phosphatase: 76 U/L (ref 39–117)
Bilirubin, Direct: 0.1 mg/dL (ref 0.0–0.3)
Total Bilirubin: 0.5 mg/dL (ref 0.2–1.2)
Total Protein: 7 g/dL (ref 6.0–8.3)

## 2023-06-23 LAB — LIPID PANEL
Cholesterol: 165 mg/dL (ref 0–200)
HDL: 57.5 mg/dL (ref >39.00)
LDL Cholesterol: 89 mg/dL (ref 0–99)
NonHDL: 107.05
Total CHOL/HDL Ratio: 3
Triglycerides: 90 mg/dL (ref 0.0–149.0)
VLDL: 18 mg/dL (ref 0.0–40.0)

## 2023-06-23 LAB — BASIC METABOLIC PANEL
BUN: 17 mg/dL (ref 6–23)
CO2: 29 meq/L (ref 19–32)
Calcium: 9.4 mg/dL (ref 8.4–10.5)
Chloride: 103 meq/L (ref 96–112)
Creatinine, Ser: 0.7 mg/dL (ref 0.40–1.20)
GFR: 84.68 mL/min (ref >60.00)
Glucose, Bld: 86 mg/dL (ref 70–99)
Potassium: 4.3 meq/L (ref 3.5–5.1)
Sodium: 141 meq/L (ref 135–145)

## 2023-06-25 ENCOUNTER — Ambulatory Visit (INDEPENDENT_AMBULATORY_CARE_PROVIDER_SITE_OTHER): Payer: Medicare HMO | Admitting: Internal Medicine

## 2023-06-25 VITALS — BP 126/68 | HR 75 | Temp 98.0°F | Ht 63.0 in | Wt 187.2 lb

## 2023-06-25 DIAGNOSIS — E7800 Pure hypercholesterolemia, unspecified: Secondary | ICD-10-CM

## 2023-06-25 DIAGNOSIS — J449 Chronic obstructive pulmonary disease, unspecified: Secondary | ICD-10-CM | POA: Diagnosis not present

## 2023-06-25 DIAGNOSIS — S22080S Wedge compression fracture of T11-T12 vertebra, sequela: Secondary | ICD-10-CM

## 2023-06-25 DIAGNOSIS — Z860100 Personal history of colon polyps, unspecified: Secondary | ICD-10-CM

## 2023-06-25 DIAGNOSIS — E041 Nontoxic single thyroid nodule: Secondary | ICD-10-CM

## 2023-06-25 DIAGNOSIS — M545 Low back pain, unspecified: Secondary | ICD-10-CM

## 2023-06-25 DIAGNOSIS — K219 Gastro-esophageal reflux disease without esophagitis: Secondary | ICD-10-CM

## 2023-06-25 DIAGNOSIS — Z8673 Personal history of transient ischemic attack (TIA), and cerebral infarction without residual deficits: Secondary | ICD-10-CM

## 2023-06-25 DIAGNOSIS — F439 Reaction to severe stress, unspecified: Secondary | ICD-10-CM

## 2023-06-25 DIAGNOSIS — R918 Other nonspecific abnormal finding of lung field: Secondary | ICD-10-CM

## 2023-06-25 MED ORDER — PRAVASTATIN SODIUM 80 MG PO TABS: 80.0000 mg | ORAL_TABLET | Freq: Every day | ORAL | 3 refills | Status: AC

## 2023-06-25 NOTE — Progress Notes (Signed)
Subjective:    Patient ID: Alexis Reed, female    DOB: 1946/01/28, 77 y.o.   MRN: [id number]  Patient here for  Chief Complaint  Patient presents with   Medical Management of Chronic Issues    3 month f/u    HPI Here for follow up regarding history of CVA, sleep apnea and hypercholesterolemia. Saw Dr Jayme Cloud - 08/05/22 - f/u lung nodule.  Started on trelegy. Had PET scan which revealed right upper lobe pulmonary nodule - stable from recent studies, but decreased in size from more remote priors - favored to reflect a resolving infectious/inflammatory process.  States recommended f/u in 6 months. Continue inhaled corticosteroids and LABA.  Had f/u CT 04/2023 - stable lung nodule.  Recommended f/u CT chest in 6 months. Has been seeing ortho- compression fractures.  MRI of the lumbar spine shows L4-5 spondylolisthesis. She has compression fracture at T10 and T11 superior end plates, then W09 as well. Essentially compression fractures at T10 through L1, L3, L5.  Went to PT.  Helped.  Receiving prolia. Ortho prescribing tramadol. Takes prn. Saw Dr Tonna Boehringer 05/2023 - f/u left breast mass. Elected f/u mammogram in 6 months. Discussed. F/u thyroid nodule - scheduled for 07/2023. Did report increased lower back pain and pain radiating around to side. (Left). Intermittent since Thanksgiving. Noticed rash. Rash persistent. Pain is some better. Rash appears to be consistent with shingles. Discussed shingles.    Past Medical History:  Diagnosis Date   Arthritis    Benign breast lumps    multiple lumps biopsy and remove x's 5 last being 1979 by Dr. Okey Dupre   COPD (chronic obstructive pulmonary disease) (HCC)    MILD-RARELY EVER HAS TO USE COMBIVENT INHALER   Fibroids    s/p hysterectomy   GERD (gastroesophageal reflux disease)    Hypercholesterolemia    Osteoporosis    Personal history of tobacco use, presenting hazards to health 08/07/2015   Urinary tract infection    Past Surgical History:   Procedure Laterality Date   ABDOMINAL HYSTERECTOMY  1997   with bilateral oophorectomy   BREAST BIOPSY Left    multiple done   BREAST BIOPSY Right 2016   stereo- neg. FC changes   BREAST BIOPSY Left 06/02/2023   [place] Core Bx, Ribbon clip - path pending   BREAST BIOPSY Left 06/02/2023   [place] LT BREAST BX W LOC DEV 1ST LESION IMG BX SPEC US GUIDE 06/02/2023 [facility]   BREAST EXCISIONAL BIOPSY Right 1975   multiple biopsies done   BREAST SURGERY Left    lump removed. Unsure of date   BREAST SURGERY Right    lump removed. Unsure of date   COLONOSCOPY  2010   Dr. Mechele Collin   COLONOSCOPY WITH PROPOFOL N/A 06/17/2021   Procedure: COLONOSCOPY WITH PROPOFOL;  Surgeon: Toney Reil, MD;  Location: [facility] ENDOSCOPY;  Service: Gastroenterology;  Laterality: N/A;   ESOPHAGOGASTRODUODENOSCOPY (EGD) WITH PROPOFOL  06/17/2021   Procedure: ESOPHAGOGASTRODUODENOSCOPY (EGD) WITH PROPOFOL;  Surgeon: Toney Reil, MD;  Location: [facility] ENDOSCOPY;  Service: Gastroenterology;;   KNEE ARTHROSCOPY WITH LATERAL MENISECTOMY Right 05/08/2020   Procedure: RIGHT KNEE ARTHROSCOPY WITH DEBRIDEMENT AND PARTIAL LATERAL MENISCECTOMY;  Surgeon: Christena Flake, MD;  Location: [facility] ORS;  Service: Orthopedics;  Laterality: Right;   LAPAROSCOPIC APPENDECTOMY N/A 12/21/2018   Procedure: APPENDECTOMY LAPAROSCOPIC CONVERTED TO OPEN;  Surgeon: Sung Amabile, DO;  Location: [facility] ORS;  Service: General;  Laterality: N/A;   TONSILLECTOMY     Family History  Problem Relation Age of Onset   Hyperlipidemia Mother    Thyroid disease Mother    Hypertension Mother    Urinary tract infection Mother    Colon cancer Father    Stroke Father    Heart disease Father        myocardial infarction   Breast cancer Neg Hx    Social History   Socioeconomic History   Marital status: Married    Spouse name: Not on file   Number of children: 2   Years of education: Not on file   Highest education level: Some college, no  degree  Occupational History   Not on file  Tobacco Use   Smoking status: Former    Current packs/day: 0.00    Average packs/day: 1 pack/day for 30.0 years (30.0 ttl pk-yrs)    Types: Cigarettes    Start date: 04/26/1974    Quit date: 04/26/2004    Years since quitting: 19.1    Passive exposure: Past   Smokeless tobacco: Never  Vaping Use   Vaping status: Never Used  Substance and Sexual Activity   Alcohol use: Yes    Alcohol/week: 0.0 standard drinks of alcohol    Comment: occassional wine every other day   Drug use: No   Sexual activity: Yes    Birth control/protection: Post-menopausal  Other Topics Concern   Not on file  Social History Narrative   She is married, has two children. 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   Social Drivers of Corporate investment banker Strain: Low Risk  (06/22/2023)   Overall Financial Resource Strain (CARDIA)    Difficulty of Paying Living Expenses: Not hard at all  Food Insecurity: No Food Insecurity (06/22/2023)   Hunger Vital Sign    Worried About Running Out of Food in the Last Year: Never true    Ran Out of Food in the Last Year: Never true  Transportation Needs: No Transportation Needs (06/22/2023)   PRAPARE - Administrator, Civil Service (Medical): No    Lack of Transportation (Non-Medical): No  Physical Activity: Insufficiently Active (06/22/2023)   Exercise Vital Sign    Days of Exercise per Week: 1 day    Minutes of Exercise per Session: 20 min  Stress: No Stress Concern Present (06/22/2023)   [employer] of Occupational Health - Occupational Stress Questionnaire    Feeling of Stress : Not at all  Social Connections: Socially Integrated (06/22/2023)   Social Connection and Isolation Panel [NHANES]    Frequency of Communication with Friends and Family: More than three times a week    Frequency of Social Gatherings with Friends and Family: Twice a week    Attends Religious Services: More than 4 times per  year    Active Member of [employer] or Organizations: Yes    Attends Engineer, structural: More than 4 times per year    Marital Status: Married     Review of Systems  Constitutional:  Negative for appetite change and unexpected weight change.  HENT:  Negative for congestion and sinus pressure.   Respiratory:  Negative for cough, chest tightness and shortness of breath.   Cardiovascular:  Negative for chest pain and palpitations.  Gastrointestinal:  Negative for abdominal pain, diarrhea, nausea and vomiting.  Genitourinary:  Negative for difficulty urinating and dysuria.  Musculoskeletal:  Positive for back pain. Negative for joint swelling and myalgias.  Skin:  Positive for rash. Negative for wound.  Neurological:  Negative  for dizziness and headaches.  Psychiatric/Behavioral:  Negative for agitation and dysphoric mood.        Objective:     BP 126/68   Pulse 75   Temp 98 F (36.7 C)   Ht 5\' 3"  (1.6 m)   Wt 187 lb 3.2 oz (84.9 kg)   LMP 07/20/1995   SpO2 98%   BMI 33.16 kg/m  Wt Readings from Last 3 Encounters:  06/25/23 187 lb 3.2 oz (84.9 kg)  05/12/23 184 lb (83.5 kg)  03/18/23 175 lb 9.6 oz (79.7 kg)    Physical Exam Vitals reviewed.  Constitutional:      General: She is not in acute distress.    Appearance: Normal appearance.  HENT:     Head: Normocephalic and atraumatic.     Right Ear: External ear normal.     Left Ear: External ear normal.     Mouth/Throat:     Pharynx: No oropharyngeal exudate or posterior oropharyngeal erythema.  Eyes:     General: No scleral icterus.       Right eye: No discharge.        Left eye: No discharge.     Conjunctiva/sclera: Conjunctivae normal.  Neck:     Thyroid: No thyromegaly.  Cardiovascular:     Rate and Rhythm: Normal rate and regular rhythm.  Pulmonary:     Effort: No respiratory distress.     Breath sounds: Normal breath sounds. No wheezing.  Abdominal:     General: Bowel sounds are normal.      Palpations: Abdomen is soft.     Tenderness: There is no abdominal tenderness.  Musculoskeletal:        General: No swelling or tenderness.     Cervical back: Neck supple. No tenderness.  Lymphadenopathy:     Cervical: No cervical adenopathy.  Skin:    Comments: Residual rash lower back and left flank - c/w shingles rash.  No open vesicles.   Neurological:     Mental Status: She is alert.  Psychiatric:        Mood and Affect: Mood normal.        Behavior: Behavior normal.      Outpatient Encounter Medications as of 06/25/2023  Medication Sig   traMADol (ULTRAM) 50 MG tablet Take 50 mg by mouth.   acetaminophen (TYLENOL) 500 MG tablet Take 1,000 mg by mouth every 6 (six) hours as needed for moderate pain.   albuterol (VENTOLIN HFA) 108 (90 Base) MCG/ACT inhaler Inhale 2 puffs into the lungs every 6 (six) hours as needed for wheezing or shortness of breath.   aspirin EC 81 MG EC tablet Take 1 tablet (81 mg total) by mouth daily. Swallow whole.   Calcium Carb-Cholecalciferol (CALCIUM 500 +D) 500-400 MG-UNIT TABS Take 1 tablet by mouth daily.   Cholecalciferol (VITAMIN D) 2000 UNITS tablet Take 2,000 Units by mouth daily.   Cranberry 450 MG CAPS Take 450 mg by mouth daily.   denosumab (PROLIA) 60 MG/ML SOSY injection Inject 60 mg into the skin every 6 (six) months.   ibuprofen (ADVIL) 800 MG tablet Take 800 mg by mouth 3 (three) times daily.   omeprazole (PRILOSEC) 20 MG capsule Take 1 capsule (20 mg total) by mouth daily.   pravastatin (PRAVACHOL) 80 MG tablet Take 1 tablet (80 mg total) by mouth daily.   TRELEGY ELLIPTA 100-62.5-25 MCG/ACT AEPB USE ONE INHALATION INTO THE LUNGS ONCE A DAY RINSE MOUTH AFTER EACH USE   [DISCONTINUED] azithromycin (ZITHROMAX) 250  MG tablet Take 2 tablets (500 mg) on  Day 1,  followed by 1 tablet (250 mg) once daily on Days 2 through 5. (Patient not taking: Reported on 05/12/2023)   [DISCONTINUED] cyclobenzaprine (FLEXERIL) 5 MG tablet Take 5 mg by mouth  as needed.   [DISCONTINUED] methocarbamol (ROBAXIN) 500 MG tablet Take 1 tablet by mouth every 6 (six) hours as needed.   [DISCONTINUED] methylPREDNISolone (MEDROL DOSEPAK) 4 MG TBPK tablet Take as directed on the package. (Patient not taking: Reported on 05/12/2023)   [DISCONTINUED] nitrofurantoin, macrocrystal-monohydrate, (MACROBID) 100 MG capsule Take 1 capsule (100 mg total) by mouth 2 (two) times daily.   [DISCONTINUED] pravastatin (PRAVACHOL) 80 MG tablet Take 1 tablet (80 mg total) by mouth daily.   No facility-administered encounter medications on file as of 06/25/2023.     Lab Results  Component Value Date   WBC 4.5 06/22/2023   HGB 14.1 06/22/2023   HCT 44.1 06/22/2023   PLT 254.0 06/22/2023   GLUCOSE 86 06/22/2023   CHOL 165 06/22/2023   TRIG 90.0 06/22/2023   HDL 57.50 06/22/2023   LDLDIRECT 153.0 07/17/2015   LDLCALC 89 06/22/2023   ALT 16 06/22/2023   AST 19 06/22/2023   NA 141 06/22/2023   K 4.3 06/22/2023   CL 103 06/22/2023   CREATININE 0.70 06/22/2023   BUN 17 06/22/2023   CO2 29 06/22/2023   TSH 1.80 06/22/2023   INR 0.9 07/23/2021   HGBA1C 5.5 08/02/2021    [place] LT BREAST BX W LOC DEV 1ST LESION IMG BX SPEC US GUIDE Addendum Date: 06/03/2023 ADDENDUM REPORT: 06/03/2023 14:03 ADDENDUM: PATHOLOGY revealed: 1. Breast, left, needle core biopsy, 12:00 2 cmfn, mass with architectural distortion, ribbon :- GIANT CELL REACTION, HEMOSIDERIN DEPOSITION AND STROMAL FIBROSIS. NEGATIVE FOR ATYPIA OR MALIGNANCY. Pathology results are DISCORDANT with imaging findings, per Dr. Meda Klinefelter with excision recommended. Pathology results and recommendations were discussed with patient via telephone on 06/03/2023 by Randa Lynn RN. Patient reported biopsy site doing well with no adverse symptoms, and only slight tenderness at the site. Post biopsy care instructions were reviewed, questions were answered and my direct phone number was provided. Patient was instructed to call  [facility] for any additional questions or concerns related to biopsy site. RECOMMENDATION: Surgical consultation for consideration of excision. Request for surgical consultation relayed to Irving Shows RN at [facility] by Randa Lynn RN on 06/03/2023. Pathology results reported by Randa Lynn RN on 06/03/2023. Electronically Signed   By: Meda Klinefelter M.D.   On: 06/03/2023 14:03   Result Date: 06/03/2023 CLINICAL DATA:  LEFT breast mass with architectural distortion EXAM: ULTRASOUND GUIDED LEFT BREAST CORE NEEDLE BIOPSY COMPARISON:  Previous exam(s). PROCEDURE: I met with the patient and we discussed the procedure of ultrasound-guided biopsy, including benefits and alternatives. We discussed the high likelihood of a successful procedure. We discussed the risks of the procedure, including infection, bleeding, tissue injury, clip migration, and inadequate sampling. Informed written consent was given. The usual time-out protocol was performed immediately prior to the procedure. Lesion quadrant: Upper outer quadrant Using sterile technique and 1% lidocaine and 1% lidocaine with epinephrine as local anesthetic, under direct ultrasound visualization, a 14 gauge spring-loaded device was used to perform biopsy of a mass at 12 o'clock 2 cm from the nipple using a lateral approach. At the conclusion of the procedure a RIBBON shaped tissue marker clip was deployed into the biopsy cavity. Follow up 2 view mammogram was performed and dictated  separately. IMPRESSION: Ultrasound guided biopsy of a LEFT breast mass at 12. No apparent complications. Electronically Signed: By: Meda Klinefelter M.D. On: 06/02/2023 13:58   MM CLIP PLACEMENT LEFT Result Date: 06/02/2023 CLINICAL DATA:  Status post ultrasound-guided biopsy EXAM: 3D DIAGNOSTIC LEFT MAMMOGRAM POST ULTRASOUND BIOPSY COMPARISON:  Previous exam(s). FINDINGS: 3D Mammographic images were obtained following ultrasound guided biopsy of a  LEFT breast mass. The RIBBON biopsy marking clip is in expected position at the site of biopsy. This is at site of screening mammographic concern. IMPRESSION: Appropriate positioning of the RIBBON shaped biopsy marking clip at the site of biopsy in the upper breast. Final Assessment: Post Procedure Mammograms for Marker Placement Electronically Signed   By: Meda Klinefelter M.D.   On: 06/02/2023 13:57       Assessment & Plan:  Hypercholesterolemia Assessment & Plan: On pravastatin.  Low cholesterol diet and exercise. Follow lipid panel and liver function tests.    Orders: -     Lipid panel; Future -     Hepatic function panel; Future -     Basic metabolic panel; Future  Compression fracture of T12 vertebra, sequela Assessment & Plan: [facility] - T 11-12 -  s/p PT - helped.  Continue prolia.    Chronic obstructive pulmonary disease, unspecified COPD type (HCC) Assessment & Plan: Followed by Dr Belia Heman - abnormal CT RUL nodule - f/u CT scan in 3 months.  Evaluated 03/2022.  Had f/u CT 06/09/22 - Lung nodule -  Increased size of the bilobed nodule in the anterior right upper lobe now measuring 8 x 5 mm. Recommended f/u with pulmonary. Saw Dr Jayme Cloud.  PET as outlined - revealed right upper lobe pulmonary nodule - stable from recent studies, but decreased in size from more remote priors - favored to reflect a resolving infectious/inflammatory process.  Continue inhaled corticosteroids and LABA. Breathing stable. Had f/u CT 04/2023 - stable lung nodule.  Recommended f/u CT chest in 6 months. Breathing stable.    Gastroesophageal reflux disease, unspecified whether esophagitis present Assessment & Plan: Continue prilosec.    History of colonic polyps Assessment & Plan: Colonoscopy 06/2021.  Recommended f/u in 3 years.    History of CVA (cerebrovascular accident) Assessment & Plan: Found to have acute corpus callosum infarct.  On plavix and aspirin.  Symptoms improved.  Seeing Dr Sherryll Burger.  Continue aspirin daily and pravastatin. S/p ST.    Acute low back pain without sciatica, unspecified back pain laterality Assessment & Plan: Being followed by [facility] as outlined. Has tramadol prn. Recent back and flank pain - rash - appears to be c/w shingles.  Rash resolving. Pain improved.  Follow    Lung nodules Assessment & Plan: Saw Dr Jayme Cloud - 08/05/22 - f/u lung nodule.  Started on trelegy. Had PET scan which revealed right upper lobe pulmonary nodule - stable from recent studies, but decreased in size from more remote priors - favored to reflect a resolving infectious/inflammatory process.  States recommended f/u in 6 months. Continue inhaled corticosteroids and LABA.  Had f/u CT 04/2023 - stable lung nodule.  Recommended f/u CT chest in 6 months.   Stress Assessment & Plan: Overall appears to be handling things well.  Follow.    Thyroid nodule Assessment & Plan: Saw endocrinology 07/28/22.  Recommended f/u ultrasound and TSH in 12 months.  Should be due 07/2023.    Other orders -     Pravastatin Sodium; Take 1 tablet (80 mg total) by mouth daily.  Dispense:  90 tablet; Refill: 3     Dale Shinnston, MD

## 2023-06-28 ENCOUNTER — Encounter: Payer: Self-pay | Admitting: Internal Medicine

## 2023-06-28 NOTE — Assessment & Plan Note (Signed)
Saw Dr Jayme Cloud - 08/05/22 - f/u lung nodule.  Started on trelegy. Had PET scan which revealed right upper lobe pulmonary nodule - stable from recent studies, but decreased in size from more remote priors - favored to reflect a resolving infectious/inflammatory process.  States recommended f/u in 6 months. Continue inhaled corticosteroids and LABA.  Had f/u CT 04/2023 - stable lung nodule.  Recommended f/u CT chest in 6 months.

## 2023-06-28 NOTE — Assessment & Plan Note (Signed)
Colonoscopy 06/2021.  Recommended f/u in 3 years.  

## 2023-06-28 NOTE — Assessment & Plan Note (Signed)
On pravastatin.  Low cholesterol diet and exercise.  Follow lipid panel and liver function tests.   

## 2023-06-28 NOTE — Assessment & Plan Note (Signed)
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  On plavix and aspirin.  Symptoms improved.  Seeing Dr Sherryll Burger. Continue aspirin daily and pravastatin. S/p ST.

## 2023-06-28 NOTE — Assessment & Plan Note (Signed)
Continue prilosec

## 2023-06-28 NOTE — Assessment & Plan Note (Addendum)
Followed by Dr Belia Heman - abnormal CT RUL nodule - f/u CT scan in 3 months.  Evaluated 03/2022.  Had f/u CT 06/09/22 - Lung nodule -  Increased size of the bilobed nodule in the anterior right upper lobe now measuring 8 x 5 mm. Recommended f/u with pulmonary. Saw Dr Jayme Cloud.  PET as outlined - revealed right upper lobe pulmonary nodule - stable from recent studies, but decreased in size from more remote priors - favored to reflect a resolving infectious/inflammatory process.  Continue inhaled corticosteroids and LABA. Breathing stable. Had f/u CT 04/2023 - stable lung nodule.  Recommended f/u CT chest in 6 months. Breathing stable.

## 2023-06-28 NOTE — Assessment & Plan Note (Signed)
EMERGE - T 11-12 -  s/p PT - helped.  Continue prolia.

## 2023-06-28 NOTE — Assessment & Plan Note (Signed)
Saw endocrinology 07/28/22.  Recommended f/u ultrasound and TSH in 12 months.  Should be due 07/2023.

## 2023-06-28 NOTE — Assessment & Plan Note (Signed)
Overall appears to be handling things well.  Follow.  ?

## 2023-06-28 NOTE — Assessment & Plan Note (Signed)
Being followed by [facility] as outlined. Has tramadol prn. Recent back and flank pain - rash - appears to be c/w shingles.  Rash resolving. Pain improved.  Follow

## 2023-07-27 ENCOUNTER — Encounter: Payer: Self-pay | Admitting: Pulmonary Disease

## 2023-07-27 ENCOUNTER — Encounter: Payer: Self-pay | Admitting: Internal Medicine

## 2023-07-27 NOTE — Telephone Encounter (Signed)
 Patient ROV is not until 09/11/23 and I don't have any appts for PFTs for February yet

## 2023-07-31 ENCOUNTER — Encounter: Payer: Self-pay | Admitting: Internal Medicine

## 2023-07-31 MED ORDER — DENOSUMAB 60 MG/ML ~~LOC~~ SOSY
60.0000 mg | PREFILLED_SYRINGE | Freq: Once | SUBCUTANEOUS | Status: AC
  Administered 2023-08-13: 60 mg via SUBCUTANEOUS

## 2023-07-31 NOTE — Addendum Note (Signed)
Addended by: Warden Fillers on: 07/31/2023 01:00 PM   Modules accepted: Orders

## 2023-08-01 ENCOUNTER — Other Ambulatory Visit: Payer: Self-pay | Admitting: Pulmonary Disease

## 2023-08-13 ENCOUNTER — Ambulatory Visit: Payer: Medicare HMO

## 2023-08-13 DIAGNOSIS — M810 Age-related osteoporosis without current pathological fracture: Secondary | ICD-10-CM | POA: Diagnosis not present

## 2023-08-13 MED ORDER — DENOSUMAB 60 MG/ML ~~LOC~~ SOSY
60.0000 mg | PREFILLED_SYRINGE | Freq: Once | SUBCUTANEOUS | Status: AC
  Administered 2024-02-17: 60 mg via SUBCUTANEOUS

## 2023-08-13 NOTE — Progress Notes (Signed)
Pt presented today for her Prolia injection. Left arm, SQ. Pt voiced no concerns nor showed any signs of distress during injection.

## 2023-08-26 ENCOUNTER — Encounter: Payer: Self-pay | Admitting: Pulmonary Disease

## 2023-08-26 NOTE — Telephone Encounter (Signed)
There are no  more PFT appts to be scheduled for February. There are several that will not have the PFT done before they return

## 2023-09-01 NOTE — Telephone Encounter (Signed)
We apologize for the delay.  We are working on getting extra pulmonary function test slots.  If she is not having any acute difficulties, lets make sure that her PFTs get done within the next month and then follow-up after that.

## 2023-09-11 ENCOUNTER — Ambulatory Visit: Payer: Medicare HMO | Admitting: Pulmonary Disease

## 2023-10-06 ENCOUNTER — Other Ambulatory Visit: Payer: Self-pay | Admitting: Surgery

## 2023-10-06 DIAGNOSIS — N6320 Unspecified lump in the left breast, unspecified quadrant: Secondary | ICD-10-CM

## 2023-10-15 ENCOUNTER — Other Ambulatory Visit: Payer: Self-pay

## 2023-10-20 ENCOUNTER — Other Ambulatory Visit (INDEPENDENT_AMBULATORY_CARE_PROVIDER_SITE_OTHER): Payer: Medicare HMO

## 2023-10-20 ENCOUNTER — Ambulatory Visit
Admission: RE | Admit: 2023-10-20 | Discharge: 2023-10-20 | Disposition: A | Discharge status: Home / Self Care | Source: Ambulatory Visit | Attending: Pulmonary Disease | Admitting: Pulmonary Disease

## 2023-10-20 DIAGNOSIS — R911 Solitary pulmonary nodule: Secondary | ICD-10-CM | POA: Insufficient documentation

## 2023-10-20 DIAGNOSIS — E7800 Pure hypercholesterolemia, unspecified: Secondary | ICD-10-CM | POA: Diagnosis not present

## 2023-10-20 LAB — LIPID PANEL
Cholesterol: 170 mg/dL (ref 0–200)
HDL: 58.4 mg/dL (ref >39.00)
LDL Cholesterol: 96 mg/dL (ref 0–99)
NonHDL: 111.85
Total CHOL/HDL Ratio: 3
Triglycerides: 79 mg/dL (ref 0.0–149.0)
VLDL: 15.8 mg/dL (ref 0.0–40.0)

## 2023-10-20 LAB — BASIC METABOLIC PANEL WITH GFR
BUN: 15 mg/dL (ref 6–23)
CO2: 29 meq/L (ref 19–32)
Calcium: 9.5 mg/dL (ref 8.4–10.5)
Chloride: 104 meq/L (ref 96–112)
Creatinine, Ser: 0.77 mg/dL (ref 0.40–1.20)
GFR: 75.35 mL/min (ref >60.00)
Glucose, Bld: 91 mg/dL (ref 70–99)
Potassium: 4.8 meq/L (ref 3.5–5.1)
Sodium: 143 meq/L (ref 135–145)

## 2023-10-20 LAB — HEPATIC FUNCTION PANEL
ALT: 13 U/L (ref 0–35)
AST: 15 U/L (ref 0–37)
Albumin: 4.5 g/dL (ref 3.5–5.2)
Alkaline Phosphatase: 92 U/L (ref 39–117)
Bilirubin, Direct: 0.1 mg/dL (ref 0.0–0.3)
Total Bilirubin: 0.5 mg/dL (ref 0.2–1.2)
Total Protein: 6.8 g/dL (ref 6.0–8.3)

## 2023-10-26 ENCOUNTER — Ambulatory Visit (INDEPENDENT_AMBULATORY_CARE_PROVIDER_SITE_OTHER): Payer: Medicare HMO | Admitting: Internal Medicine

## 2023-10-26 ENCOUNTER — Encounter: Payer: Self-pay | Admitting: Internal Medicine

## 2023-10-26 VITALS — BP 128/70 | HR 83 | Temp 98.0°F | Resp 16 | Ht 62.0 in | Wt 189.4 lb

## 2023-10-26 DIAGNOSIS — E7800 Pure hypercholesterolemia, unspecified: Secondary | ICD-10-CM

## 2023-10-26 DIAGNOSIS — E041 Nontoxic single thyroid nodule: Secondary | ICD-10-CM

## 2023-10-26 DIAGNOSIS — Z0000 Encounter for general adult medical examination without abnormal findings: Secondary | ICD-10-CM | POA: Diagnosis not present

## 2023-10-26 DIAGNOSIS — R918 Other nonspecific abnormal finding of lung field: Secondary | ICD-10-CM

## 2023-10-26 DIAGNOSIS — J449 Chronic obstructive pulmonary disease, unspecified: Secondary | ICD-10-CM

## 2023-10-26 DIAGNOSIS — Z8673 Personal history of transient ischemic attack (TIA), and cerebral infarction without residual deficits: Secondary | ICD-10-CM | POA: Diagnosis not present

## 2023-10-26 DIAGNOSIS — E559 Vitamin D deficiency, unspecified: Secondary | ICD-10-CM

## 2023-10-26 NOTE — Assessment & Plan Note (Signed)
 Saw Dr Viva Grise - 08/05/22 - f/u lung nodule.  Started on trelegy. Had PET scan which revealed right upper lobe pulmonary nodule - stable from recent studies, but decreased in size from more remote priors - favored to reflect a resolving infectious/inflammatory process.  States recommended f/u in 6 months. Continue inhaled corticosteroids and LABA.  Had f/u CT 04/2023 - stable lung nodule.  Recommended f/u CT chest in 6 months. Had scan last week. Results pending.

## 2023-10-26 NOTE — Assessment & Plan Note (Signed)
 Followed by Dr Auston Left - abnormal CT RUL nodule - f/u CT scan in 3 months.  Evaluated 03/2022.  Had f/u CT 06/09/22 - Lung nodule -  Increased size of the bilobed nodule in the anterior right upper lobe now measuring 8 x 5 mm. Recommended f/u with pulmonary. Saw Dr Viva Grise.  PET as outlined - revealed right upper lobe pulmonary nodule - stable from recent studies, but decreased in size from more remote priors - favored to reflect a resolving infectious/inflammatory process.  Continue inhaled corticosteroids and LABA. Breathing stable. Had f/u CT 04/2023 - stable lung nodule.  Recommended f/u CT chest in 6 months. Has CT last week. Results pending. Breathing stable. Continues on trelegy. Not needing rescue inhaler.

## 2023-10-26 NOTE — Assessment & Plan Note (Signed)
 Saw endocrinology 07/28/22.  Recommended f/u ultrasound and TSH in 12 months.  Thyroid ultrasound 09/02/23 - stable.

## 2023-10-26 NOTE — Assessment & Plan Note (Signed)
 Check vitamin D level with next labs.  ?

## 2023-10-26 NOTE — Progress Notes (Signed)
 Subjective:    Patient ID: Alexis Reed, female    DOB: 06-19-1946, 78 y.o.   MRN: [id number]  Patient here for  Chief Complaint  Patient presents with   Annual Exam    HPI Here for a physical exam.  Had f/u with Dr Sherryll Burger 09/01/23. F/u - h/o CVA. Recommended to continue aspirin and pravastatin. Also discussed MCI. Consider donepezil. Seeing Dr Tedd Sias for f/u thyroid nodules. Recommended f/u ultrasound. Saw Dr Jayme Cloud - 08/05/22 - f/u lung nodule. Started on trelegy. Had PET scan which revealed right upper lobe pulmonary nodule - stable from recent studies, but decreased in size from more remote priors - favored to reflect a resolving infectious/inflammatory process. States recommended f/u in 6 months. Continue inhaled corticosteroids and LABA. Had f/u CT 04/2023 - stable lung nodule. Recommended f/u CT chest in 6 months. She had her CT last week. Results pending. Had an episode last week - just did not feel well. No specific complaints. Was questioning if starting to get a UTI, but symptoms resolved. She is feeling fine now. No urinary symptoms. Eating and drinking well. No chest pain or sob. Using trelegy. No vomiting or diarrhea.    Past Medical History:  Diagnosis Date   Arthritis    Benign breast lumps    multiple lumps biopsy and remove x's 5 last being 1979 by Dr. Okey Dupre   COPD (chronic obstructive pulmonary disease) (HCC)    MILD-RARELY EVER HAS TO USE COMBIVENT INHALER   Fibroids    s/p hysterectomy   GERD (gastroesophageal reflux disease)    Hypercholesterolemia    Osteoporosis    Personal history of tobacco use, presenting hazards to health 08/07/2015   Urinary tract infection    Past Surgical History:  Procedure Laterality Date   ABDOMINAL HYSTERECTOMY  1997   with bilateral oophorectomy   BREAST BIOPSY Left    multiple done   BREAST BIOPSY Right 2016   stereo- neg. FC changes   BREAST BIOPSY Left 06/02/2023   [place] Core Bx, Ribbon clip - path pending   BREAST  BIOPSY Left 06/02/2023   [place] LT BREAST BX W LOC DEV 1ST LESION IMG BX SPEC US GUIDE 06/02/2023 [facility]   BREAST EXCISIONAL BIOPSY Right 1975   multiple biopsies done   BREAST SURGERY Left    lump removed. Unsure of date   BREAST SURGERY Right    lump removed. Unsure of date   COLONOSCOPY  2010   Dr. Mechele Collin   COLONOSCOPY WITH PROPOFOL N/A 06/17/2021   Procedure: COLONOSCOPY WITH PROPOFOL;  Surgeon: Toney Reil, MD;  Location[facility] ENDOSCOPY;  Service: Gastroenterology;  Laterality: N/A;   ESOPHAGOGASTRODUODENOSCOPY (EGD) WITH PROPOFOL  06/17/2021   Procedure: ESOPHAGOGASTRODUODENOSCOPY (EGD) WITH PROPOFOL;  Surgeon: Toney Reil, MD;  Location: [facility] ENDOSCOPY;  Service: Gastroenterology;;   KNEE ARTHROSCOPY WITH LATERAL MENISECTOMY Right 05/08/2020   Procedure: RIGHT KNEE ARTHROSCOPY WITH DEBRIDEMENT AND PARTIAL LATERAL MENISCECTOMY;  Surgeon: Christena Flake, MD;  Location: [facility] ORS;  Service: Orthopedics;  Laterality: Right;   LAPAROSCOPIC APPENDECTOMY N/A 12/21/2018   Procedure: APPENDECTOMY LAPAROSCOPIC CONVERTED TO OPEN;  Surgeon: Sung Amabile, DO;  Location: [facility] ORS;  Service: General;  Laterality: N/A;   TONSILLECTOMY     Family History  Problem Relation Age of Onset   Hyperlipidemia Mother    Thyroid disease Mother    Hypertension Mother    Urinary tract infection Mother    Colon cancer Father    Stroke Father  Heart disease Father        myocardial infarction   Breast cancer Neg Hx    Social History   Socioeconomic History   Marital status: Married    Spouse name: Not on file   Number of children: 2   Years of education: Not on file   Highest education level: Some college, no degree  Occupational History   Not on file  Tobacco Use   Smoking status: Former    Current packs/day: 0.00    Average packs/day: 1 pack/day for 30.0 years (30.0 ttl pk-yrs)    Types: Cigarettes    Start date: 04/26/1974    Quit date: 04/26/2004    Years since  quitting: 19.5    Passive exposure: Past   Smokeless tobacco: Never  Vaping Use   Vaping status: Never Used  Substance and Sexual Activity   Alcohol use: Yes    Alcohol/week: 0.0 standard drinks of alcohol    Comment: occassional wine every other day   Drug use: No   Sexual activity: Yes    Birth control/protection: Post-menopausal  Other Topics Concern   Not on file  Social History Narrative   She is married, has two children. 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   Social Drivers of Corporate investment banker Strain: Low Risk  (10/22/2023)   Overall Financial Resource Strain (CARDIA)    Difficulty of Paying Living Expenses: Not hard at all  Food Insecurity: No Food Insecurity (10/22/2023)   Hunger Vital Sign    Worried About Running Out of Food in the Last Year: Never true    Ran Out of Food in the Last Year: Never true  Transportation Needs: No Transportation Needs (10/22/2023)   PRAPARE - Administrator, Civil Service (Medical): No    Lack of Transportation (Non-Medical): No  Physical Activity: Insufficiently Active (10/22/2023)   Exercise Vital Sign    Days of Exercise per Week: 3 days    Minutes of Exercise per Session: 30 min  Stress: No Stress Concern Present (10/22/2023)   [employer] of Occupational Health - Occupational Stress Questionnaire    Feeling of Stress : Not at all  Social Connections: Socially Integrated (10/22/2023)   Social Connection and Isolation Panel [NHANES]    Frequency of Communication with Friends and Family: More than three times a week    Frequency of Social Gatherings with Friends and Family: Twice a week    Attends Religious Services: More than 4 times per year    Active Member of [employer] or Organizations: Yes    Attends Banker Meetings: 1 to 4 times per year    Marital Status: Married     Review of Systems  Constitutional:  Negative for appetite change and unexpected weight change.  HENT:  Negative  for congestion, sinus pressure and sore throat.   Eyes:  Negative for pain and visual disturbance.  Respiratory:  Negative for cough, chest tightness and shortness of breath.   Cardiovascular:  Negative for chest pain, palpitations and leg swelling.  Gastrointestinal:  Negative for abdominal pain, diarrhea, nausea and vomiting.  Genitourinary:  Negative for difficulty urinating and dysuria.  Musculoskeletal:  Negative for joint swelling and myalgias.  Skin:  Negative for color change and rash.  Neurological:  Negative for dizziness and headaches.  Hematological:  Negative for adenopathy. Does not bruise/bleed easily.  Psychiatric/Behavioral:  Negative for agitation and dysphoric mood.        Objective:  BP 128/70   Pulse 83   Temp 98 F (36.7 C)   Resp 16   Ht 5\' 2"  (1.575 m)   Wt 189 lb 6.4 oz (85.9 kg)   LMP 07/20/1995   SpO2 97%   BMI 34.64 kg/m  Wt Readings from Last 3 Encounters:  10/26/23 189 lb 6.4 oz (85.9 kg)  06/25/23 187 lb 3.2 oz (84.9 kg)  05/12/23 184 lb (83.5 kg)    Physical Exam Vitals reviewed.  Constitutional:      General: She is not in acute distress.    Appearance: Normal appearance. She is well-developed.  HENT:     Head: Normocephalic and atraumatic.     Right Ear: External ear normal.     Left Ear: External ear normal.     Mouth/Throat:     Pharynx: No oropharyngeal exudate or posterior oropharyngeal erythema.  Eyes:     General: No scleral icterus.       Right eye: No discharge.        Left eye: No discharge.     Conjunctiva/sclera: Conjunctivae normal.  Neck:     Thyroid: No thyromegaly.  Cardiovascular:     Rate and Rhythm: Normal rate and regular rhythm.  Pulmonary:     Effort: No tachypnea, accessory muscle usage or respiratory distress.     Breath sounds: Normal breath sounds. No decreased breath sounds or wheezing.  Chest:  Breasts:    Right: No inverted nipple, mass, nipple discharge or tenderness (no axillary adenopathy).      Left: No inverted nipple, mass, nipple discharge or tenderness (no axilarry adenopathy).  Abdominal:     General: Bowel sounds are normal.     Palpations: Abdomen is soft.     Tenderness: There is no abdominal tenderness.  Musculoskeletal:        General: No swelling or tenderness.     Cervical back: Neck supple.  Lymphadenopathy:     Cervical: No cervical adenopathy.  Skin:    General: Skin is warm.     Findings: No erythema or rash.  Neurological:     Mental Status: She is alert and oriented to person, place, and time.  Psychiatric:        Mood and Affect: Mood normal.        Behavior: Behavior normal.         Outpatient Encounter Medications as of 10/26/2023  Medication Sig   acetaminophen (TYLENOL) 500 MG tablet Take 1,000 mg by mouth every 6 (six) hours as needed for moderate pain.   albuterol (VENTOLIN HFA) 108 (90 Base) MCG/ACT inhaler Inhale 2 puffs into the lungs every 6 (six) hours as needed for wheezing or shortness of breath.   aspirin EC 81 MG EC tablet Take 1 tablet (81 mg total) by mouth daily. Swallow whole.   Calcium Carb-Cholecalciferol (CALCIUM 500 +D) 500-400 MG-UNIT TABS Take 1 tablet by mouth daily.   Cholecalciferol (VITAMIN D) 2000 UNITS tablet Take 2,000 Units by mouth daily.   Cranberry 450 MG CAPS Take 450 mg by mouth daily.   denosumab (PROLIA) 60 MG/ML SOSY injection Inject 60 mg into the skin every 6 (six) months.   ibuprofen (ADVIL) 800 MG tablet Take 800 mg by mouth 3 (three) times daily.   omeprazole (PRILOSEC) 20 MG capsule Take 1 capsule (20 mg total) by mouth daily.   pravastatin (PRAVACHOL) 80 MG tablet Take 1 tablet (80 mg total) by mouth daily.   traMADol (ULTRAM) 50 MG tablet Take 50  mg by mouth. (Patient not taking: Reported on 10/26/2023)   TRELEGY ELLIPTA 100-62.5-25 MCG/ACT AEPB USE ONE INHALATION INTO THE LUNGS ONCE A DAY RINSE MOUTH AFTER EACH USE   Facility-Administered Encounter Medications as of 10/26/2023  Medication    [START ON 02/10/2024] denosumab (PROLIA) injection 60 mg     Lab Results  Component Value Date   WBC 4.5 06/22/2023   HGB 14.1 06/22/2023   HCT 44.1 06/22/2023   PLT 254.0 06/22/2023   GLUCOSE 91 10/20/2023   CHOL 170 10/20/2023   TRIG 79.0 10/20/2023   HDL 58.40 10/20/2023   LDLDIRECT 153.0 07/17/2015   LDLCALC 96 10/20/2023   ALT 13 10/20/2023   AST 15 10/20/2023   NA 143 10/20/2023   K 4.8 10/20/2023   CL 104 10/20/2023   CREATININE 0.77 10/20/2023   BUN 15 10/20/2023   CO2 29 10/20/2023   TSH 1.80 06/22/2023   INR 0.9 07/23/2021   HGBA1C 5.5 08/02/2021       Assessment & Plan:  Routine general medical examination at a health care facility  Hypercholesterolemia Assessment & Plan: Tolerating pravastatin 80mg  q day. Low cholesterol diet and exercise. Follow lipid panel.   Orders: -     Lipid panel; Future -     Hepatic function panel; Future -     Basic metabolic panel with GFR; Future  Health care maintenance Assessment & Plan: Physical today 10/26/23.  Mammogram scheduled for 11/2023. Colonoscopy 06/17/21 -  One 4 mm polyp in the cecum. One 12 mm polyp in the descending colon. Non-bleeding external hemorrhoids. Continue prolia.    Chronic obstructive pulmonary disease, unspecified COPD type (HCC) Assessment & Plan: Followed by Dr Belia Heman - abnormal CT RUL nodule - f/u CT scan in 3 months.  Evaluated 03/2022.  Had f/u CT 06/09/22 - Lung nodule -  Increased size of the bilobed nodule in the anterior right upper lobe now measuring 8 x 5 mm. Recommended f/u with pulmonary. Saw Dr Jayme Cloud.  PET as outlined - revealed right upper lobe pulmonary nodule - stable from recent studies, but decreased in size from more remote priors - favored to reflect a resolving infectious/inflammatory process.  Continue inhaled corticosteroids and LABA. Breathing stable. Had f/u CT 04/2023 - stable lung nodule.  Recommended f/u CT chest in 6 months. Has CT last week. Results pending. Breathing  stable. Continues on trelegy. Not needing rescue inhaler.    History of CVA (cerebrovascular accident) Assessment & Plan: Found to have acute corpus callosum infarct.  On plavix and aspirin.  Symptoms improved.  Seeing Dr Sherryll Burger. Continue aspirin daily and pravastatin. No change in symptoms.    Lung nodules Assessment & Plan: Saw Dr Jayme Cloud - 08/05/22 - f/u lung nodule.  Started on trelegy. Had PET scan which revealed right upper lobe pulmonary nodule - stable from recent studies, but decreased in size from more remote priors - favored to reflect a resolving infectious/inflammatory process.  States recommended f/u in 6 months. Continue inhaled corticosteroids and LABA.  Had f/u CT 04/2023 - stable lung nodule.  Recommended f/u CT chest in 6 months. Had scan last week. Results pending.    Thyroid nodule Assessment & Plan: Saw endocrinology 07/28/22.  Recommended f/u ultrasound and TSH in 12 months.  Thyroid ultrasound 09/02/23 - stable.    Vitamin D deficiency Assessment & Plan: Check vitamin D level with next labs.       Dale , MD

## 2023-10-26 NOTE — Assessment & Plan Note (Signed)
 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  On plavix and aspirin.  Symptoms improved.  Seeing Dr Mason Sole. Continue aspirin daily and pravastatin. No change in symptoms.

## 2023-10-26 NOTE — Assessment & Plan Note (Signed)
 Tolerating pravastatin 80mg  q day. Low cholesterol diet and exercise. Follow lipid panel.

## 2023-10-26 NOTE — Assessment & Plan Note (Signed)
 Physical today 10/26/23.  Mammogram scheduled for 11/2023. Colonoscopy 06/17/21 -  One 4 mm polyp in the cecum. One 12 mm polyp in the descending colon. Non-bleeding external hemorrhoids. Continue prolia.

## 2023-11-07 ENCOUNTER — Other Ambulatory Visit: Payer: Self-pay | Admitting: Pulmonary Disease

## 2023-11-07 DIAGNOSIS — R911 Solitary pulmonary nodule: Secondary | ICD-10-CM

## 2023-11-09 ENCOUNTER — Telehealth: Payer: Self-pay

## 2023-11-09 NOTE — Telephone Encounter (Signed)
 Alexis Reed please call and schedule PET scan appt. Thank you!

## 2023-11-09 NOTE — Telephone Encounter (Signed)
 I called Mrs. Alexis Reed to see if Abe Abed had spoken with her. She stated yes and that she can't do 11/16/23. Mrs. Taffe called Elora Hales and rescheduled her PET for 11/17/2023 @ 8:00am

## 2023-11-09 NOTE — Telephone Encounter (Signed)
-----   Message from Alexis Reed sent at 11/07/2023  9:51 AM EDT ----- Chest CT performed on 20 October 2023 not resulted until 06 November 2023.  I have communicated with the patient via telephone conversation on 07 November 2023 at 9:45 AM.  Patient aware that lung nodule of concern has increased in size and that a follow-up PET/CT will be necessary.  Patient will be out of town from 28 April through 3 May however states that she will be available via phone to schedule PET/CT.  Please schedule a follow-up appointment with me after PET/CT is done so we can discuss next steps.

## 2023-11-09 NOTE — Telephone Encounter (Signed)
 Alexis Reed with with [facility] scheduled the PET for the patient on 11/16/23 @ 11:30am

## 2023-11-16 ENCOUNTER — Ambulatory Visit

## 2023-11-17 ENCOUNTER — Ambulatory Visit

## 2023-11-25 ENCOUNTER — Ambulatory Visit
Admission: RE | Admit: 2023-11-25 | Discharge: 2023-11-25 | Disposition: A | Discharge status: Home / Self Care | Source: Ambulatory Visit | Attending: Pulmonary Disease | Admitting: Pulmonary Disease

## 2023-11-25 DIAGNOSIS — R911 Solitary pulmonary nodule: Secondary | ICD-10-CM | POA: Diagnosis present

## 2023-11-25 LAB — GLUCOSE, CAPILLARY: Glucose-Capillary: 94 mg/dL (ref 70–99)

## 2023-11-25 MED ORDER — FLUDEOXYGLUCOSE F - 18 (FDG) INJECTION
9.8000 | Freq: Once | INTRAVENOUS | Status: AC | PRN
  Administered 2023-11-25: 10.44 via INTRAVENOUS

## 2023-11-27 ENCOUNTER — Ambulatory Visit: Payer: Self-pay | Admitting: Pulmonary Disease

## 2023-12-01 ENCOUNTER — Ambulatory Visit
Admission: RE | Admit: 2023-12-01 | Discharge: 2023-12-01 | Disposition: A | Discharge status: Home / Self Care | Source: Ambulatory Visit | Attending: Surgery | Admitting: Surgery

## 2023-12-01 ENCOUNTER — Telehealth: Payer: Self-pay

## 2023-12-01 ENCOUNTER — Encounter: Payer: Self-pay | Admitting: Pulmonary Disease

## 2023-12-01 ENCOUNTER — Ambulatory Visit: Admitting: Pulmonary Disease

## 2023-12-01 VITALS — BP 136/80 | HR 77 | Temp 97.6°F | Ht 62.0 in | Wt 191.2 lb

## 2023-12-01 DIAGNOSIS — R911 Solitary pulmonary nodule: Secondary | ICD-10-CM | POA: Diagnosis not present

## 2023-12-01 DIAGNOSIS — J439 Emphysema, unspecified: Secondary | ICD-10-CM

## 2023-12-01 DIAGNOSIS — N6342 Unspecified lump in left breast, subareolar: Secondary | ICD-10-CM | POA: Insufficient documentation

## 2023-12-01 DIAGNOSIS — N6320 Unspecified lump in the left breast, unspecified quadrant: Secondary | ICD-10-CM | POA: Diagnosis present

## 2023-12-01 DIAGNOSIS — Z8673 Personal history of transient ischemic attack (TIA), and cerebral infarction without residual deficits: Secondary | ICD-10-CM | POA: Diagnosis not present

## 2023-12-01 DIAGNOSIS — Z87891 Personal history of nicotine dependence: Secondary | ICD-10-CM | POA: Diagnosis not present

## 2023-12-01 DIAGNOSIS — J449 Chronic obstructive pulmonary disease, unspecified: Secondary | ICD-10-CM

## 2023-12-01 NOTE — Telephone Encounter (Signed)
 Patient is aware of date and times. Bronch email has been sent.

## 2023-12-01 NOTE — Telephone Encounter (Signed)
 Robotic bronchoscopy with EBUS 12/14/2023 at 10:00am  Lung Nodule [id number], [id number]. [id number]  Alexis Reed please see bronch info.

## 2023-12-01 NOTE — Telephone Encounter (Signed)
 For the codes 96045, [id number], [phone] Prior Auth Not Required Refer # [id number]

## 2023-12-01 NOTE — Patient Instructions (Addendum)
 VISIT SUMMARY:  During your visit today, we discussed the recent changes in your lung nodule, which has increased in size and activity. We also reviewed your history of emphysema and stroke, and planned the next steps for your care.  YOUR PLAN:  -LUNG NODULE WITH INCREASED ACTIVITY: The lung nodule in your right upper lung has grown and shows significant activity, which may indicate cancer. We need to perform a biopsy to confirm this. If it is cancer, we will likely treat it with a type of targeted radiation called SBRT. You will need a robotic bronchoscopy under general anesthesia to get a tissue sample, and we will closely monitor your blood pressure during the procedure. After treatment, you will have regular CT scans every six months initially, then annually for five years if stable.  -EMPHYSEMA: Emphysema is a lung condition that makes it hard to breathe. You are currently using a Trelegy inhaler, which you find helpful.  -STROKE: You had a stroke over two years ago after knee replacement surgery. We will need to use general anesthesia for your upcoming bronchoscopy, and we will closely monitor your blood pressure to reduce the risk of another stroke.  We discussed that the procedure would have to be done under general anesthesia. The anesthesia team will discuss his part of the process with you.  Complications from the procedure itself are usually minor. One potential complication would be collapse of the lung which can occur in 1-3% of the cases. If  this happens we would have to put a small tube to relieve the collapse and you would have to spend the night in the hospital in that event.  Another possibility would be that of bleeding, this is usually taken care of during the procedure.  In the situations you may need to be observed overnight.  For the most part though, should be able to go home the same day.  Other possibilities could include that the procedure would be nondiagnostic meaning that  no definitive diagnosis could be attained.  We strive to try to decrease these potential issues.    INSTRUCTIONS:  Please schedule your robotic bronchoscopy for June 2nd for the biopsy. Before the procedure, you will need a CT scan to help map your airways. If the biopsy confirms cancer, we will refer you to radiation oncology for SBRT treatment. Also, schedule a follow-up appointment 3 to 4 weeks after the procedure.

## 2023-12-01 NOTE — Progress Notes (Unsigned)
 Subjective:    Patient ID: Alexis Reed, female    DOB: 1945/09/19, 78 y.o.   MRN: [id number]  Patient Care Team: Dellar Fenton, MD as PCP - General (Internal Medicine) Marquita Situ, Magali Schmitz, MD (General Surgery) Dellar Fenton, MD (Internal Medicine)  Chief Complaint  Patient presents with   Follow-up    Lung nodule, discuss next steps.    BACKGROUND/INTERVAL:The patient is a 78 year old former smoker with a 30-pack-year history of smoking and a history as noted below, who presents for follow-up on the issue of a right upper lobe lung nodule. She has been monitored for a right upper lobe nodule that is 8 mm in diameter and appears to be bilobed.  I first evaluated the patient on 17 June 2022 for this nodule.  She was last seen on 12 May 2023.  PET/CT was obtained in February 2024 that showed no hypermetabolic activity on the nodule and that the nodule had decreased in size.  After that CT chest was obtained 04 May 2023 and nodule was stable.  She was set up for follow-up of the CT which she had on 20 October 2023.  This showed significant increase in size and mild spiculation was extension of the pleural surface of this nodule.  Nodule now measures 13 x 10 mm.  PET/CT obtained 14 May shows FDG avidity.  Patient presents to discuss next steps.  HPI Discussed the use of AI scribe software for clinical note transcription with the patient, who gave verbal consent to proceed.  History of Present Illness   Alexis Reed is a 78 year old female with a lung nodule who presents for follow-up. She is accompanied by her daughter, Alexis Reed.  The lung nodule, initially identified in May 2022 and initially identified as a subsolid, was previously noted to have regressed slightly and showed no activity on a PET CT in February 2024. However, recent imaging indicates an increase in size and significant activity on the PET CT. The nodule is located in the right upper lobe of her lung,  anteriorly. Due to its small size and lack of prior activity, a biopsy was not performed, but the recent changes now make biopsy necessary.  She has a history of emphysema most notably surrounding the nodule.  She uses Trelegy daily, which she finds helpful. Her medical history also includes a stroke following anesthesia for a knee replacement over two years ago, raising concerns about undergoing anesthesia again.  However it is noted that the stroke occurred 2 days after the surgical event and it is not necessarily related to the anesthesia.  She quit smoking 20 years ago. No swelling in her legs and no other significant symptoms are reported.  Overall she feels well and looks well.  She has had prior PFTs in 2022 that showed moderate COPD, PFTs have been requested since then however these have not been completed.    Review of Systems A 10 point review of systems was performed and it is as noted above otherwise negative.   Patient Active Problem List   Diagnosis Date Noted   Compression fracture of T12 vertebra (HCC) 08/26/2022   Low back pain 05/27/2022   Anemia 11/02/2021   History of total knee arthroplasty 09/15/2021   B12 deficiency 09/15/2021   History of CVA (cerebrovascular accident) 08/18/2021   Edema of right lower leg 08/02/2021   Acute CVA (cerebrovascular accident) (HCC) 08/02/2021   AMS (altered mental status) 08/01/2021   Pre-op evaluation 07/24/2021   Lung  nodules 07/24/2021   Dyspepsia    Burping    Polyp of cecum    Polyp of ascending colon    Vitamin D  deficiency 05/11/2021   Increased heart rate 05/10/2021   Breast cancer screening 01/07/2021   Ear anomaly 07/18/2020   Complex tear of medial meniscus of right knee as current injury 06/22/2020   Breast nodule 06/10/2020   Thyroid  nodule 05/29/2020   Asymmetry of clavicles 05/07/2020   Complex tear of lateral meniscus of right knee as current injury 04/30/2020   Primary osteoarthritis of right knee 04/30/2020    Leg wound, right 01/16/2020   Laceration of skin of knee 01/10/2020   Palpitations 10/10/2019   Breast pain 10/10/2019   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   Side pain 08/22/2018   History of colonic polyps 02/04/2016   Personal history of tobacco use, presenting hazards to health 08/07/2015   Health care maintenance 11/18/2014   Breast calcifications on mammogram 10/18/2014   Obesity 07/23/2014   COPD (chronic obstructive pulmonary disease) (HCC) 04/26/2014   Tobacco use disorder 04/26/2014   Abnormal mammogram 04/12/2014   UTI (urinary tract infection) 04/02/2014   SOB (shortness of breath) 02/25/2014   Stress 02/25/2014   Internal hemorrhoids without complication 09/24/2013   Osteopenia 06/18/2012   GERD (gastroesophageal reflux disease) 06/18/2012   Hypercholesterolemia 06/18/2012    Social History   Tobacco Use   Smoking status: Former    Current packs/day: 0.00    Average packs/day: 1 pack/day for 30.0 years (30.0 ttl pk-yrs)    Types: Cigarettes    Start date: 04/26/1974    Quit date: 04/26/2004    Years since quitting: 19.6    Passive exposure: Past   Smokeless tobacco: Never  Substance Use Topics   Alcohol use: Yes    Alcohol/week: 0.0 standard drinks of alcohol    Comment: occassional wine every other day    Allergies  Allergen Reactions   Benzoin Other (See Comments)    After breast surgery After breast surgery   Boniva [Ibandronate] Other (See Comments)    Aching    Codeine Nausea Only   Fosamax [Alendronate Sodium]     Caused aching   Lipitor [Atorvastatin ] Hives   Meloxicam  Other (See Comments)    GI Upset   Penicillins Swelling    Facial swelling Did it involve swelling of the face/tongue/throat, SOB, or low BP? Yes Did it involve sudden or severe rash/hives, skin peeling, or any reaction on the inside of your mouth or nose? No Did you need to seek medical attention at a hospital or doctor's office? No When did it last happen?      within the  last 10 years If all above answers are "NO", may proceed with cephalosporin use.     Current Meds  Medication Sig   acetaminophen  (TYLENOL ) 500 MG tablet Take 1,000 mg by mouth every 6 (six) hours as needed for moderate pain.   albuterol  (VENTOLIN  HFA) 108 (90 Base) MCG/ACT inhaler Inhale 2 puffs into the lungs every 6 (six) hours as needed for wheezing or shortness of breath.   aspirin  EC 81 MG EC tablet Take 1 tablet (81 mg total) by mouth daily. Swallow whole.   Calcium  Carb-Cholecalciferol  (CALCIUM  500 +D) 500-400 MG-UNIT TABS Take 1 tablet by mouth daily.   Cholecalciferol  (VITAMIN D ) 2000 UNITS tablet Take 2,000 Units by mouth daily.   Cranberry 450 MG CAPS Take 450 mg by mouth daily.   denosumab  (PROLIA ) 60 MG/ML SOSY injection  Inject 60 mg into the skin every 6 (six) months.   ibuprofen  (ADVIL ) 800 MG tablet Take 800 mg by mouth 3 (three) times daily.   omeprazole  (PRILOSEC) 20 MG capsule Take 1 capsule (20 mg total) by mouth daily.   pravastatin  (PRAVACHOL ) 80 MG tablet Take 1 tablet (80 mg total) by mouth daily.   traMADol  (ULTRAM ) 50 MG tablet Take 50 mg by mouth.   TRELEGY ELLIPTA  100-62.5-25 MCG/ACT AEPB USE ONE INHALATION INTO THE LUNGS ONCE A DAY RINSE MOUTH AFTER EACH USE   Current Facility-Administered Medications for the 12/01/23 encounter (Office Visit) with Marc Senior, MD  Medication   [START ON 02/10/2024] denosumab  (PROLIA ) injection 60 mg    Immunization History  Administered Date(s) Administered   Fluad Quad(high Dose 65+) 04/13/2020, 05/10/2021, 05/28/2022   Fluad Trivalent(High Dose 65+) 05/12/2023   Influenza Split 05/12/2012, 04/11/2013   Influenza, High Dose Seasonal PF 04/28/2017, 05/10/2018   Influenza-Unspecified 05/18/2014, 05/15/2015   PFIZER Comirnaty(Gray Top)Covid-19 Tri-Sucrose Vaccine 08/05/2019, 08/26/2019, 04/27/2020, 05/07/2021   PFIZER(Purple Top)SARS-COV-2 Vaccination 08/05/2019, 08/26/2019, 04/27/2020   Pneumococcal Conjugate-13  07/29/2017   Pneumococcal Polysaccharide-23 04/26/2014   Zoster, Live 06/13/2013        Objective:     BP 136/80 (BP Location: Left Arm, Patient Position: Sitting, Cuff Size: Normal)   Pulse 77   Temp 97.6 F (36.4 C) (Temporal)   Ht 5\' 2"  (1.575 m)   Wt 191 lb 3.2 oz (86.7 kg)   LMP 07/20/1995   SpO2 98%   BMI 34.97 kg/m   SpO2: 98 %  GENERAL: Well-developed, well-nourished woman, no acute distress, fully ambulatory, no conversational dyspnea. HEAD: Normocephalic, atraumatic.  EYES: Pupils equal, round, reactive to light.  No scleral icterus.  MOUTH: Dentition intact, multiple fillings noted, no thrush.  Mallampati II airway. NECK: Supple. No thyromegaly. Trachea midline. No JVD.  No adenopathy. PULMONARY: Good air entry bilaterally.  Coarse, otherwise, no adventitious sounds. CARDIOVASCULAR: S1 and S2. Regular rate and rhythm.  No rubs, murmurs or gallops heard. ABDOMEN: Benign. MUSCULOSKELETAL: No joint deformity, no clubbing, no edema.  NEUROLOGIC: No overt focal deficit, no gait disturbance, speech is fluent. SKIN: Intact,warm,dry. PSYCH: Mood and behavior normal.  Representative image from CT performed 20 October 2023 show an enlarging nodule on the right upper lobe with spiculated margins and extension to pleura (arrow):    Representative image from PET/CT performed 25 Nov 2023 showing increased uptake on the nodule on the right upper lobe anterior subsegment (arrow):    Assessment & Plan:     ICD-10-CM   1. Nodule of upper lobe of right lung  R91.1 CT SUPER D CHEST WO [facility] PILOT    2. Chronic obstructive pulmonary disease, unspecified COPD type (HCC)  J44.9       Orders Placed This Encounter  Procedures   CT SUPER D CHEST WO [facility] PILOT    Standing Status:   Future    Expiration Date:   11/30/2024    Scheduling Instructions:     Please do before 09 Dec 2023 if possible (patient preference).  Patient has robotic assisted navigational bronchoscopy on 2  June.    Preferred imaging location?:   [facility]   Discussion:    Lung nodule with increased activity The lung nodule in the right upper lobe has increased in size and demonstrates significant activity on PET CT, suggesting possible malignant transformation. Previous PET CT indicated no activity. Biopsy is necessary to confirm malignancy. Due to emphysema and previous stroke  post-anesthesia, surgical intervention is not preferred. SBRT is considered appropriate if malignancy is confirmed, given the nodule's stage 1 classification and absence of lymph node involvement. General anesthesia will be required for bronchoscopy, with close monitoring of blood pressure to mitigate risks. The likelihood of requiring radiation if malignant is high. Post-treatment, she will need CT scans every six months initially, then annually for five years if stable. - Schedule robotic bronchoscopy on June 2nd for biopsy. - Order pre-procedure CT scan to map airways for robotic bronchoscopy. - If biopsy confirms malignancy, refer to radiation oncology for SBRT. - Monitor with CT scans every six months initially, then annually for five years if stable.  Emphysema Emphysema is present surrounding the lung nodule. Current management includes Trelegy inhaler, which is effective.  Stroke Stroke occurred following knee replacement surgery over two years ago, unlikely related to anesthesia due to timing. General anesthesia will be required for the upcoming bronchoscopy, with close monitoring of blood pressure to mitigate risks.  Follow-up - Schedule follow-up appointment 3 to 4 weeks after the procedure.      Advised if symptoms do not improve or worsen, to please contact office for sooner follow up or seek emergency care.    I spent 40 minutes of dedicated to the care of this patient on the date of this encounter to include pre-visit review of records, face-to-face time with the patient discussing conditions  above, post visit ordering of testing, clinical documentation with the electronic health record, making appropriate referrals as documented, and communicating necessary findings to members of the patients care team.     C. [name], MD Advanced [facility]ch[facility]  [facility]    *This note was generated using voice recognition software/Dragon and/or AI transcription program.  Despite best efforts to proofread, errors can occur which can change the meaning. Any transcriptional errors that result from this process are unintentional and may not be fully corrected at the time of dictation.

## 2023-12-01 NOTE — H&P (View-Only) (Signed)
 Subjective:    Patient ID: Alexis Reed, female    DOB: 1945/09/19, 78 y.o.   MRN: [id number]  Patient Care Team: Dellar Fenton, MD as PCP - General (Internal Medicine) Marquita Situ, Magali Schmitz, MD (General Surgery) Dellar Fenton, MD (Internal Medicine)  Chief Complaint  Patient presents with   Follow-up    Lung nodule, discuss next steps.    BACKGROUND/INTERVAL:The patient is a 78 year old former smoker with a 30-pack-year history of smoking and a history as noted below, who presents for follow-up on the issue of a right upper lobe lung nodule. She has been monitored for a right upper lobe nodule that is 8 mm in diameter and appears to be bilobed.  I first evaluated the patient on 17 June 2022 for this nodule.  She was last seen on 12 May 2023.  PET/CT was obtained in February 2024 that showed no hypermetabolic activity on the nodule and that the nodule had decreased in size.  After that CT chest was obtained 04 May 2023 and nodule was stable.  She was set up for follow-up of the CT which she had on 20 October 2023.  This showed significant increase in size and mild spiculation was extension of the pleural surface of this nodule.  Nodule now measures 13 x 10 mm.  PET/CT obtained 14 May shows FDG avidity.  Patient presents to discuss next steps.  HPI Discussed the use of AI scribe software for clinical note transcription with the patient, who gave verbal consent to proceed.  History of Present Illness   Alexis Reed is a 78 year old female with a lung nodule who presents for follow-up. She is accompanied by her daughter, [name].  The lung nodule, initially identified in May 2022 and initially identified as a subsolid, was previously noted to have regressed slightly and showed no activity on a PET CT in February 2024. However, recent imaging indicates an increase in size and significant activity on the PET CT. The nodule is located in the right upper lobe of her lung,  anteriorly. Due to its small size and lack of prior activity, a biopsy was not performed, but the recent changes now make biopsy necessary.  She has a history of emphysema most notably surrounding the nodule.  She uses Trelegy daily, which she finds helpful. Her medical history also includes a stroke following anesthesia for a knee replacement over two years ago, raising concerns about undergoing anesthesia again.  However it is noted that the stroke occurred 2 days after the surgical event and it is not necessarily related to the anesthesia.  She quit smoking 20 years ago. No swelling in her legs and no other significant symptoms are reported.  Overall she feels well and looks well.  She has had prior PFTs in 2022 that showed moderate COPD, PFTs have been requested since then however these have not been completed.    Review of Systems A 10 point review of systems was performed and it is as noted above otherwise negative.   Patient Active Problem List   Diagnosis Date Noted   Compression fracture of T12 vertebra (HCC) 08/26/2022   Low back pain 05/27/2022   Anemia 11/02/2021   History of total knee arthroplasty 09/15/2021   B12 deficiency 09/15/2021   History of CVA (cerebrovascular accident) 08/18/2021   Edema of right lower leg 08/02/2021   Acute CVA (cerebrovascular accident) (HCC) 08/02/2021   AMS (altered mental status) 08/01/2021   Pre-op evaluation 07/24/2021   Lung  nodules 07/24/2021   Dyspepsia    Burping    Polyp of cecum    Polyp of ascending colon    Vitamin D  deficiency 05/11/2021   Increased heart rate 05/10/2021   Breast cancer screening 01/07/2021   Ear anomaly 07/18/2020   Complex tear of medial meniscus of right knee as current injury 06/22/2020   Breast nodule 06/10/2020   Thyroid  nodule 05/29/2020   Asymmetry of clavicles 05/07/2020   Complex tear of lateral meniscus of right knee as current injury 04/30/2020   Primary osteoarthritis of right knee 04/30/2020    Leg wound, right 01/16/2020   Laceration of skin of knee 01/10/2020   Palpitations 10/10/2019   Breast pain 10/10/2019   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   Side pain 08/22/2018   History of colonic polyps 02/04/2016   Personal history of tobacco use, presenting hazards to health 08/07/2015   Health care maintenance 11/18/2014   Breast calcifications on mammogram 10/18/2014   Obesity 07/23/2014   COPD (chronic obstructive pulmonary disease) (HCC) 04/26/2014   Tobacco use disorder 04/26/2014   Abnormal mammogram 04/12/2014   UTI (urinary tract infection) 04/02/2014   SOB (shortness of breath) 02/25/2014   Stress 02/25/2014   Internal hemorrhoids without complication 09/24/2013   Osteopenia 06/18/2012   GERD (gastroesophageal reflux disease) 06/18/2012   Hypercholesterolemia 06/18/2012    Social History   Tobacco Use   Smoking status: Former    Current packs/day: 0.00    Average packs/day: 1 pack/day for 30.0 years (30.0 ttl pk-yrs)    Types: Cigarettes    Start date: 04/26/1974    Quit date: 04/26/2004    Years since quitting: 19.6    Passive exposure: Past   Smokeless tobacco: Never  Substance Use Topics   Alcohol use: Yes    Alcohol/week: 0.0 standard drinks of alcohol    Comment: occassional wine every other day    Allergies  Allergen Reactions   Benzoin Other (See Comments)    After breast surgery After breast surgery   Boniva [Ibandronate] Other (See Comments)    Aching    Codeine Nausea Only   Fosamax [Alendronate Sodium]     Caused aching   Lipitor [Atorvastatin ] Hives   Meloxicam  Other (See Comments)    GI Upset   Penicillins Swelling    Facial swelling Did it involve swelling of the face/tongue/throat, SOB, or low BP? Yes Did it involve sudden or severe rash/hives, skin peeling, or any reaction on the inside of your mouth or nose? No Did you need to seek medical attention at a hospital or doctor's office? No When did it last happen?      within the  last 10 years If all above answers are "NO", may proceed with cephalosporin use.     Current Meds  Medication Sig   acetaminophen  (TYLENOL ) 500 MG tablet Take 1,000 mg by mouth every 6 (six) hours as needed for moderate pain.   albuterol  (VENTOLIN  HFA) 108 (90 Base) MCG/ACT inhaler Inhale 2 puffs into the lungs every 6 (six) hours as needed for wheezing or shortness of breath.   aspirin  EC 81 MG EC tablet Take 1 tablet (81 mg total) by mouth daily. Swallow whole.   Calcium  Carb-Cholecalciferol  (CALCIUM  500 +D) 500-400 MG-UNIT TABS Take 1 tablet by mouth daily.   Cholecalciferol  (VITAMIN D ) 2000 UNITS tablet Take 2,000 Units by mouth daily.   Cranberry 450 MG CAPS Take 450 mg by mouth daily.   denosumab  (PROLIA ) 60 MG/ML SOSY injection  Inject 60 mg into the skin every 6 (six) months.   ibuprofen  (ADVIL ) 800 MG tablet Take 800 mg by mouth 3 (three) times daily.   omeprazole  (PRILOSEC) 20 MG capsule Take 1 capsule (20 mg total) by mouth daily.   pravastatin  (PRAVACHOL ) 80 MG tablet Take 1 tablet (80 mg total) by mouth daily.   traMADol  (ULTRAM ) 50 MG tablet Take 50 mg by mouth.   TRELEGY ELLIPTA  100-62.5-25 MCG/ACT AEPB USE ONE INHALATION INTO THE LUNGS ONCE A DAY RINSE MOUTH AFTER EACH USE   Current Facility-Administered Medications for the 12/01/23 encounter (Office Visit) with Marc Senior, MD  Medication   [START ON 02/10/2024] denosumab  (PROLIA ) injection 60 mg    Immunization History  Administered Date(s) Administered   Fluad Quad(high Dose 65+) 04/13/2020, 05/10/2021, 05/28/2022   Fluad Trivalent(High Dose 65+) 05/12/2023   Influenza Split 05/12/2012, 04/11/2013   Influenza, High Dose Seasonal PF 04/28/2017, 05/10/2018   Influenza-Unspecified 05/18/2014, 05/15/2015   PFIZER Comirnaty(Gray Top)Covid-19 Tri-Sucrose Vaccine 08/05/2019, 08/26/2019, 04/27/2020, 05/07/2021   PFIZER(Purple Top)SARS-COV-2 Vaccination 08/05/2019, 08/26/2019, 04/27/2020   Pneumococcal Conjugate-13  07/29/2017   Pneumococcal Polysaccharide-23 04/26/2014   Zoster, Live 06/13/2013        Objective:     BP 136/80 (BP Location: Left Arm, Patient Position: Sitting, Cuff Size: Normal)   Pulse 77   Temp 97.6 F (36.4 C) (Temporal)   Ht 5\' 2"  (1.575 m)   Wt 191 lb 3.2 oz (86.7 kg)   LMP 07/20/1995   SpO2 98%   BMI 34.97 kg/m   SpO2: 98 %  GENERAL: Well-developed, well-nourished woman, no acute distress, fully ambulatory, no conversational dyspnea. HEAD: Normocephalic, atraumatic.  EYES: Pupils equal, round, reactive to light.  No scleral icterus.  MOUTH: Dentition intact, multiple fillings noted, no thrush.  Mallampati II airway. NECK: Supple. No thyromegaly. Trachea midline. No JVD.  No adenopathy. PULMONARY: Good air entry bilaterally.  Coarse, otherwise, no adventitious sounds. CARDIOVASCULAR: S1 and S2. Regular rate and rhythm.  No rubs, murmurs or gallops heard. ABDOMEN: Benign. MUSCULOSKELETAL: No joint deformity, no clubbing, no edema.  NEUROLOGIC: No overt focal deficit, no gait disturbance, speech is fluent. SKIN: Intact,warm,dry. PSYCH: Mood and behavior normal.  Representative image from CT performed 20 October 2023 show an enlarging nodule on the right upper lobe with spiculated margins and extension to pleura (arrow):    Representative image from PET/CT performed 25 Nov 2023 showing increased uptake on the nodule on the right upper lobe anterior subsegment (arrow):    Assessment & Plan:     ICD-10-CM   1. Nodule of upper lobe of right lung  R91.1 CT SUPER D CHEST WO [facility]    2. Chronic obstructive pulmonary disease, unspecified COPD type (HCC)  J44.9       Orders Placed This Encounter  Procedures   CT SUPER D CHEST WO [facility]    Standing Status:   Future    Expiration Date:   11/30/2024    Scheduling Instructions:     Please do before 09 Dec 2023 if possible (patient preference).  Patient has robotic assisted navigational bronchoscopy on 2  June.    Preferred imaging location?:   [facility]   Discussion:    Lung nodule with increased activity The lung nodule in the right upper lobe has increased in size and demonstrates significant activity on PET CT, suggesting possible malignant transformation. Previous PET CT indicated no activity. Biopsy is necessary to confirm malignancy. Due to emphysema and previous stroke  post-anesthesia, surgical intervention is not preferred. SBRT is considered appropriate if malignancy is confirmed, given the nodule's stage 1 classification and absence of lymph node involvement. General anesthesia will be required for bronchoscopy, with close monitoring of blood pressure to mitigate risks. The likelihood of requiring radiation if malignant is high. Post-treatment, she will need CT scans every six months initially, then annually for five years if stable. - Schedule robotic bronchoscopy on June 2nd for biopsy. - Order pre-procedure CT scan to map airways for robotic bronchoscopy. - If biopsy confirms malignancy, refer to radiation oncology for SBRT. - Monitor with CT scans every six months initially, then annually for five years if stable.  Emphysema Emphysema is present surrounding the lung nodule. Current management includes Trelegy inhaler, which is effective.  Stroke Stroke occurred following knee replacement surgery over two years ago, unlikely related to anesthesia due to timing. General anesthesia will be required for the upcoming bronchoscopy, with close monitoring of blood pressure to mitigate risks.  Follow-up - Schedule follow-up appointment 3 to 4 weeks after the procedure.      Advised if symptoms do not improve or worsen, to please contact office for sooner follow up or seek emergency care.    I spent 40 minutes of dedicated to the care of this patient on the date of this encounter to include pre-visit review of records, face-to-face time with the patient discussing conditions  above, post visit ordering of testing, clinical documentation with the electronic health record, making appropriate referrals as documented, and communicating necessary findings to members of the patients care team.     C. [name], MD Advanced Bronchoscopy PCCM  [facility]    *This note was generated using voice recognition software/Dragon and/or AI transcription program.  Despite best efforts to proofread, errors can occur which can change the meaning. Any transcriptional errors that result from this process are unintentional and may not be fully corrected at the time of dictation.

## 2023-12-02 NOTE — Telephone Encounter (Signed)
 Noted. Nothing further needed.

## 2023-12-04 ENCOUNTER — Other Ambulatory Visit

## 2023-12-09 ENCOUNTER — Ambulatory Visit
Admission: RE | Admit: 2023-12-09 | Discharge: 2023-12-09 | Disposition: A | Discharge status: Home / Self Care | Source: Ambulatory Visit | Attending: Pulmonary Disease | Admitting: Pulmonary Disease

## 2023-12-09 ENCOUNTER — Encounter: Payer: Self-pay | Admitting: Pulmonary Disease

## 2023-12-09 ENCOUNTER — Encounter
Admission: RE | Admit: 2023-12-09 | Discharge: 2023-12-09 | Disposition: A | Discharge status: Home / Self Care | Source: Ambulatory Visit | Attending: Pulmonary Disease | Admitting: Pulmonary Disease

## 2023-12-09 DIAGNOSIS — R911 Solitary pulmonary nodule: Secondary | ICD-10-CM

## 2023-12-09 DIAGNOSIS — Z01812 Encounter for preprocedural laboratory examination: Secondary | ICD-10-CM | POA: Insufficient documentation

## 2023-12-09 NOTE — Patient Instructions (Signed)
 Your procedure is scheduled on: Monday, June 2 Report to the Registration Desk on the 1st floor of the [facility] To find out your arrival time, please call [phone] between 1PM - 3PM on: Friday, May 30th If your arrival time is 6:00 am, do not arrive before that time as the [facility] entrance doors do not open until 6:00 am.  REMEMBER: Instructions that are not followed completely may result in serious medical risk, up to and including death; or upon the discretion of your surgeon and anesthesiologist your surgery may need to be rescheduled.  Do not eat food and drink after midnight the night before surgery.  No gum chewing or hard candies.  One week prior to surgery: Stop Anti-inflammatories (NSAIDS) such as Advil , Aleve, Ibuprofen , Motrin , Naproxen, Naprosyn and Aspirin  based products such as Excedrin, Goody's Powder, BC Powder. Stop ANY OVER THE COUNTER supplements until after surgery. Like Vit D, cranberry, and calcium .  You may however, continue to take Tylenol  if needed for pain up until the day of surgery.   Do not take Aspirin  on day of surgery.   Continue taking all of your other prescription medications up until the day of surgery.  ON THE DAY OF SURGERY ONLY TAKE THESE MEDICATIONS WITH SIPS OF WATER:  omeprazole  (PRILOSEC)  pravastatin  (PRAVACHOL ) traMADol  (ULTRAM ) -as needed   Use inhalers ; Trelegy at home and bring albuterol  on the day of surgery and bring to the hospital.   No Alcohol for 24 hours before or after surgery.  No Smoking including e-cigarettes for 24 hours before surgery.  No chewable tobacco products for at least 6 hours before surgery.  No nicotine patches on the day of surgery.  Do not use any "recreational" drugs for at least a week (preferably 2 weeks) before your surgery.  Please be advised that the combination of cocaine and anesthesia may have negative outcomes, up to and including death. If you test positive for cocaine, your  surgery will be cancelled.  On the morning of surgery brush your teeth with toothpaste and water, you may rinse your mouth with mouthwash if you wish. Do not swallow any toothpaste or mouthwash.  Please shower on day of surgery.  Do not wear jewelry, make-up, hairpins, clips or nail polish.  Do not wear lotions, powders, or perfumes.   Do not shave body hair from the neck down 48 hours before surgery.  Contact lenses, hearing aids and dentures may not be worn into surgery.  Do not bring valuables to the hospital. [facility] is not responsible for any missing/lost belongings or valuables.     Notify your doctor if there is any change in your medical condition (cold, fever, infection).  Wear comfortable clothing (specific to your surgery type) to the hospital.  After surgery, you can help prevent lung complications by doing breathing exercises.  Take deep breaths and cough every 1-2 hours. Your doctor may order a device called an Incentive Spirometer to help you take deep breaths.  If you are being discharged the day of surgery, you will not be allowed to drive home. You will need a responsible individual to drive you home and stay with you for 24 hours after surgery.    Please call the Pre-admissions Testing Dept. at [phone] if you have any questions about these instructions.  Surgery Visitation Policy:  Patients having surgery or a procedure may have two visitors.  Children under the age of 19 must have an adult with them  who is not the patient.

## 2023-12-09 NOTE — Patient Instructions (Signed)
 Your procedure is scheduled on: Monday,June 2 Report to the Registration Desk on the 1st floor of the [facility]. To find out your arrival time, please call [phone] between 1PM - 3PM on: Friday, May 30th If your arrival time is 6:00 am, do not arrive before that time as the [facility] entrance doors do not open until 6:00 am.  REMEMBER: Instructions that are not followed completely may result in serious medical risk, up to and including death; or upon the discretion of your surgeon and anesthesiologist your surgery may need to be rescheduled.  Do not eat food after midnight the night before surgery.  No gum chewing or hard candies.  One week prior to surgery: Stop Anti-inflammatories (NSAIDS) such as Advil , Aleve, Ibuprofen , Motrin , Naproxen, Naprosyn and Aspirin  based products such as Excedrin, Goody's Powder, BC Powder. Stop ANY OVER THE COUNTER supplements until after surgery.  You may however, continue to take Tylenol  if needed for pain up until the day of surgery.   Do not take Aspirin  on day of surgery.   Continue taking all of your other prescription medications up until the day of surgery.  ON THE DAY OF SURGERY ONLY TAKE THESE MEDICATIONS WITH SIPS OF WATER:    Use inhalers Trelegy at home and bring albuterol  on the day of surgery and bring to the hospital.   No Alcohol for 24 hours before or after surgery.  No Smoking including e-cigarettes for 24 hours before surgery.  No chewable tobacco products for at least 6 hours before surgery.  No nicotine patches on the day of surgery.  Do not use any "recreational" drugs for at least a week (preferably 2 weeks) before your surgery.  Please be advised that the combination of cocaine and anesthesia may have negative outcomes, up to and including death. If you test positive for cocaine, your surgery will be cancelled.  On the morning of surgery brush your teeth with toothpaste and water, you may rinse your mouth with  mouthwash if you wish. Do not swallow any toothpaste or mouthwash.  Please shower on day of surgery.  Do not wear jewelry, make-up, hairpins, clips or nail polish.  Do not wear lotions, powders, or perfumes.   Do not shave body hair from the neck down 48 hours before surgery.  Contact lenses, hearing aids and dentures may not be worn into surgery.  Do not bring valuables to the hospital. [facility] is not responsible for any missing/lost belongings or valuables.    Bring your C-PAP to the hospital in case you may have to spend the night.   Notify your doctor if there is any change in your medical condition (cold, fever, infection).  Wear comfortable clothing (specific to your surgery type) to the hospital.  After surgery, you can help prevent lung complications by doing breathing exercises.  Take deep breaths and cough every 1-2 hours. Your doctor may order a device called an Incentive Spirometer to help you take deep breaths. If you are being admitted to the hospital overnight, leave your suitcase in the car. After surgery it may be brought to your room.  In case of increased patient census, it may be necessary for you, the patient, to continue your postoperative care in the Same Day Surgery department.  If you are being discharged the day of surgery, you will not be allowed to drive home. You will need a responsible individual to drive you home and stay with you for 24 hours after surgery.  Please call the Pre-admissions Testing Dept. at [phone] if you have any questions about these instructions.  Surgery Visitation Policy:  Patients having surgery or a procedure may have two visitors.  Children under the age of 52 must have an adult with them who is not the patient.  Inpatient Visitation:    Visiting hours are 7 a.m. to 8 p.m. Up to four visitors are allowed at one time in a patient room. The visitors may rotate out with other people during the day.  One  visitor age 60 or older may stay with the patient overnight and must be in the room by 8 p.m.

## 2023-12-10 ENCOUNTER — Encounter
Admission: RE | Admit: 2023-12-10 | Discharge: 2023-12-10 | Disposition: A | Discharge status: Home / Self Care | Source: Ambulatory Visit | Attending: Pulmonary Disease | Admitting: Pulmonary Disease

## 2023-12-10 ENCOUNTER — Encounter: Payer: Self-pay | Admitting: Urgent Care

## 2023-12-10 DIAGNOSIS — R9431 Abnormal electrocardiogram [ECG] [EKG]: Secondary | ICD-10-CM | POA: Insufficient documentation

## 2023-12-10 DIAGNOSIS — Z01812 Encounter for preprocedural laboratory examination: Secondary | ICD-10-CM

## 2023-12-10 DIAGNOSIS — R911 Solitary pulmonary nodule: Secondary | ICD-10-CM | POA: Diagnosis not present

## 2023-12-10 DIAGNOSIS — Z01818 Encounter for other preprocedural examination: Secondary | ICD-10-CM | POA: Insufficient documentation

## 2023-12-10 LAB — CBC
HCT: 44.4 % (ref 36.0–46.0)
Hemoglobin: 14.8 g/dL (ref 12.0–15.0)
MCH: 31.2 pg (ref 26.0–34.0)
MCHC: 33.3 g/dL (ref 30.0–36.0)
MCV: 93.7 fL (ref 80.0–100.0)
Platelets: 271 10*3/uL (ref 150–400)
RBC: 4.74 MIL/uL (ref 3.87–5.11)
RDW: 13.3 % (ref 11.5–15.5)
WBC: 5.6 10*3/uL (ref 4.0–10.5)
nRBC: 0 % (ref 0.0–0.2)

## 2023-12-14 ENCOUNTER — Encounter: Payer: Self-pay | Admitting: Pulmonary Disease

## 2023-12-14 ENCOUNTER — Ambulatory Visit: Admitting: Anesthesiology

## 2023-12-14 ENCOUNTER — Encounter
Admission: RE | Disposition: A | Discharge status: Home / Self Care | Payer: Self-pay | Source: Home / Self Care | Attending: Pulmonary Disease

## 2023-12-14 ENCOUNTER — Other Ambulatory Visit: Payer: Self-pay

## 2023-12-14 ENCOUNTER — Ambulatory Visit
Admission: RE | Admit: 2023-12-14 | Discharge: 2023-12-14 | Disposition: A | Discharge status: Home / Self Care | Attending: Pulmonary Disease | Admitting: Pulmonary Disease

## 2023-12-14 ENCOUNTER — Ambulatory Visit

## 2023-12-14 DIAGNOSIS — Z87891 Personal history of nicotine dependence: Secondary | ICD-10-CM | POA: Insufficient documentation

## 2023-12-14 DIAGNOSIS — M8580 Other specified disorders of bone density and structure, unspecified site: Secondary | ICD-10-CM | POA: Diagnosis not present

## 2023-12-14 DIAGNOSIS — C3411 Malignant neoplasm of upper lobe, right bronchus or lung: Secondary | ICD-10-CM | POA: Diagnosis present

## 2023-12-14 DIAGNOSIS — K219 Gastro-esophageal reflux disease without esophagitis: Secondary | ICD-10-CM | POA: Diagnosis not present

## 2023-12-14 DIAGNOSIS — R911 Solitary pulmonary nodule: Secondary | ICD-10-CM | POA: Diagnosis not present

## 2023-12-14 DIAGNOSIS — Z8673 Personal history of transient ischemic attack (TIA), and cerebral infarction without residual deficits: Secondary | ICD-10-CM | POA: Insufficient documentation

## 2023-12-14 HISTORY — PX: BRONCHOSCOPY, WITH BIOPSY USING ELECTROMAGNETIC NAVIGATION: SHX7536

## 2023-12-14 HISTORY — DX: Solitary pulmonary nodule: R91.1

## 2023-12-14 HISTORY — DX: Emphysema, unspecified: J43.9

## 2023-12-14 HISTORY — PX: VIDEO BRONCHOSCOPY WITH ENDOBRONCHIAL ULTRASOUND: SHX6177

## 2023-12-14 SURGERY — BRONCHOSCOPY, WITH BIOPSY USING ELECTROMAGNETIC NAVIGATION
Anesthesia: General | Laterality: Right

## 2023-12-14 MED ORDER — LACTATED RINGERS IV SOLN: INTRAVENOUS | Status: DC | PRN

## 2023-12-14 MED ORDER — FENTANYL CITRATE (PF) 100 MCG/2ML IJ SOLN
INTRAMUSCULAR | Status: AC
  Filled 2023-12-14: qty 2

## 2023-12-14 MED ORDER — OXYCODONE HCL 5 MG PO TABS: 5.0000 mg | ORAL_TABLET | Freq: Once | ORAL | Status: DC | PRN

## 2023-12-14 MED ORDER — PHENYLEPHRINE 80 MCG/ML (10ML) SYRINGE FOR IV PUSH (FOR BLOOD PRESSURE SUPPORT)
PREFILLED_SYRINGE | INTRAVENOUS | Status: DC | PRN
  Administered 2023-12-14: 80 ug via INTRAVENOUS

## 2023-12-14 MED ORDER — ROCURONIUM BROMIDE 100 MG/10ML IV SOLN
INTRAVENOUS | Status: DC | PRN
  Administered 2023-12-14: 50 mg via INTRAVENOUS

## 2023-12-14 MED ORDER — ONDANSETRON HCL 4 MG/2ML IJ SOLN: 4.0000 mg | Freq: Once | INTRAMUSCULAR | Status: DC | PRN

## 2023-12-14 MED ORDER — OXYCODONE HCL 5 MG/5ML PO SOLN: 5.0000 mg | Freq: Once | ORAL | Status: DC | PRN

## 2023-12-14 MED ORDER — ONDANSETRON HCL 4 MG/2ML IJ SOLN
INTRAMUSCULAR | Status: AC
  Filled 2023-12-14: qty 2

## 2023-12-14 MED ORDER — SODIUM CHLORIDE 0.9 % IV SOLN: Freq: Once | INTRAVENOUS | Status: DC

## 2023-12-14 MED ORDER — FENTANYL CITRATE (PF) 100 MCG/2ML IJ SOLN
INTRAMUSCULAR | Status: DC | PRN
Start: 2023-12-14 — End: 2023-12-14
  Administered 2023-12-14: 50 ug via INTRAVENOUS

## 2023-12-14 MED ORDER — LIDOCAINE HCL (CARDIAC) PF 100 MG/5ML IV SOSY
PREFILLED_SYRINGE | INTRAVENOUS | Status: DC | PRN
  Administered 2023-12-14: 60 mg via INTRAVENOUS

## 2023-12-14 MED ORDER — IPRATROPIUM-ALBUTEROL 0.5-2.5 (3) MG/3ML IN SOLN
RESPIRATORY_TRACT | Status: AC
  Filled 2023-12-14: qty 3

## 2023-12-14 MED ORDER — PHENYLEPHRINE 80 MCG/ML (10ML) SYRINGE FOR IV PUSH (FOR BLOOD PRESSURE SUPPORT)
PREFILLED_SYRINGE | INTRAVENOUS | Status: AC
  Filled 2023-12-14: qty 10

## 2023-12-14 MED ORDER — FENTANYL CITRATE (PF) 100 MCG/2ML IJ SOLN: 25.0000 ug | INTRAMUSCULAR | Status: DC | PRN

## 2023-12-14 MED ORDER — ACETAMINOPHEN 10 MG/ML IV SOLN: 1000.0000 mg | Freq: Once | INTRAVENOUS | Status: DC | PRN

## 2023-12-14 MED ORDER — MIDAZOLAM HCL 2 MG/2ML IJ SOLN
INTRAMUSCULAR | Status: AC
Start: 2023-12-14 — End: ?
  Filled 2023-12-14: qty 2

## 2023-12-14 MED ORDER — CHLORHEXIDINE GLUCONATE 0.12 % MT SOLN
OROMUCOSAL | Status: AC
  Filled 2023-12-14: qty 15

## 2023-12-14 MED ORDER — SUGAMMADEX SODIUM 200 MG/2ML IV SOLN
INTRAVENOUS | Status: DC | PRN
  Administered 2023-12-14: 200 mg via INTRAVENOUS

## 2023-12-14 MED ORDER — PROPOFOL 10 MG/ML IV BOLUS
INTRAVENOUS | Status: AC
  Filled 2023-12-14: qty 20

## 2023-12-14 MED ORDER — DEXAMETHASONE SODIUM PHOSPHATE 10 MG/ML IJ SOLN
INTRAMUSCULAR | Status: AC
  Filled 2023-12-14: qty 1

## 2023-12-14 MED ORDER — IPRATROPIUM-ALBUTEROL 0.5-2.5 (3) MG/3ML IN SOLN
3.0000 mL | Freq: Once | RESPIRATORY_TRACT | Status: AC
  Administered 2023-12-14: 3 mL via RESPIRATORY_TRACT

## 2023-12-14 MED ORDER — DEXAMETHASONE SODIUM PHOSPHATE 10 MG/ML IJ SOLN
INTRAMUSCULAR | Status: DC | PRN
  Administered 2023-12-14: 5 mg via INTRAVENOUS

## 2023-12-14 MED ORDER — CHLORHEXIDINE GLUCONATE 0.12 % MT SOLN
15.0000 mL | Freq: Once | OROMUCOSAL | Status: AC
  Administered 2023-12-14: 15 mL via OROMUCOSAL

## 2023-12-14 MED ORDER — ORAL CARE MOUTH RINSE: 15.0000 mL | Freq: Once | OROMUCOSAL | Status: AC

## 2023-12-14 MED ORDER — PROPOFOL 10 MG/ML IV BOLUS
INTRAVENOUS | Status: DC | PRN
  Administered 2023-12-14: 150 mg via INTRAVENOUS

## 2023-12-14 MED ORDER — LACTATED RINGERS IV SOLN: INTRAVENOUS | Status: DC

## 2023-12-14 MED ORDER — ONDANSETRON HCL 4 MG/2ML IJ SOLN
INTRAMUSCULAR | Status: DC | PRN
  Administered 2023-12-14: 4 mg via INTRAVENOUS

## 2023-12-14 NOTE — Transfer of Care (Signed)
 Immediate Anesthesia Transfer of Care Note  Patient: Alexis Reed  Procedure(s) Performed: BRONCHOSCOPY, WITH BIOPSY USING ELECTROMAGNETIC NAVIGATION (Right) BRONCHOSCOPY, WITH EBUS (Right)  Patient Location: PACU  Anesthesia Type:General  Level of Consciousness: drowsy  Airway & Oxygen Therapy: Patient Spontanous Breathing and Patient connected to face mask oxygen  Post-op Assessment: Report given to RN  Post vital signs: stable  Last Vitals:  Vitals Value Taken Time  BP 110/57 12/14/23 1205  Temp 36 C 12/14/23 1205  Pulse 85 12/14/23 1209  Resp 10 12/14/23 1209  SpO2 99 % 12/14/23 1209  Vitals shown include unfiled device data.  Last Pain:  Vitals:   12/14/23 0931  TempSrc: Temporal  PainSc: 0-No pain         Complications: No notable events documented.

## 2023-12-14 NOTE — Anesthesia Preprocedure Evaluation (Addendum)
 Anesthesia Evaluation  Patient identified by MRN, date of birth, ID band Patient awake    Reviewed: Allergy & Precautions, NPO status , Patient's Chart, lab work & pertinent test results  History of Anesthesia Complications (+) history of anesthetic complications  Airway Mallampati: III  TM Distance: >3 FB Neck ROM: full    Dental  (+) Dental Advidsory Given   Pulmonary COPD, former smoker   Pulmonary exam normal        Cardiovascular negative cardio ROS Normal cardiovascular exam     Neuro/Psych CVA, No Residual Symptoms  negative psych ROS   GI/Hepatic Neg liver ROS,GERD  Medicated,,  Endo/Other  negative endocrine ROS    Renal/GU      Musculoskeletal   Abdominal   Peds  Hematology negative hematology ROS (+)   Anesthesia Other Findings Patient had a stroke after a total knee replacement in 2023. States that she also doesn't remember anything that happened that week. States she wants to avoid any medications that can cause memory loss. Discussed that we will hold off on versed  or ketamine today.  Past Medical History: No date: Arthritis No date: Benign breast lumps     Comment:  multiple lumps biopsy and remove x's 5 last being 1979               by Dr. Nicolette Barrio No date: COPD (chronic obstructive pulmonary disease) (HCC)     Comment:  MILD-RARELY EVER HAS TO USE COMBIVENT  INHALER No date: Emphysema lung (HCC) No date: Fibroids     Comment:  s/p hysterectomy No date: GERD (gastroesophageal reflux disease) No date: Hypercholesterolemia No date: Lung nodule No date: Osteoporosis 08/07/2015: Personal history of tobacco use, presenting hazards to  health 2023: Stroke (HCC) No date: Urinary tract infection  Past Surgical History: 1997: ABDOMINAL HYSTERECTOMY     Comment:  with bilateral oophorectomy No date: BREAST BIOPSY; Left     Comment:  multiple done 2016: BREAST BIOPSY; Right     Comment:  stereo-  neg. FC changes 06/02/2023: BREAST BIOPSY; Left     Comment:  US  LT BREAST BX W LOC DEV 1ST LESION IMG BX SPEC US                GUIDE 06/02/2023 [facility] 1975: BREAST EXCISIONAL BIOPSY; Right     Comment:  multiple biopsies done No date: BREAST SURGERY; Left     Comment:  lump removed. Unsure of date No date: BREAST SURGERY; Right     Comment:  lump removed. Unsure of date 2010: COLONOSCOPY     Comment:  Dr. Felicita Horns 06/17/2021: COLONOSCOPY WITH PROPOFOL ; N/A     Comment:  Procedure: COLONOSCOPY WITH PROPOFOL ;  Surgeon: Selena Daily, MD;  Location: [facility];  Service:               Gastroenterology;  Laterality: N/A; 06/17/2021: ESOPHAGOGASTRODUODENOSCOPY (EGD) WITH PROPOFOL      Comment:  Procedure: ESOPHAGOGASTRODUODENOSCOPY (EGD) WITH               PROPOFOL ;  Surgeon: Selena Daily, MD;  Location:               [facility];  Service: Gastroenterology;; 05/08/2020: KNEE ARTHROSCOPY WITH LATERAL MENISECTOMY; Right     Comment:  Procedure: RIGHT KNEE ARTHROSCOPY WITH DEBRIDEMENT AND               PARTIAL LATERAL MENISCECTOMY;  Surgeon: Daun Epstein,  Kaylene Pascal,               MD;  Location: [facility];  Service: Orthopedics;                Laterality: Right; 12/21/2018: LAPAROSCOPIC APPENDECTOMY; N/A     Comment:  Procedure: APPENDECTOMY LAPAROSCOPIC CONVERTED TO OPEN;               Surgeon: Conrado Delay, DO;  Location: [facility];  Service:              General;  Laterality: N/A; No date: TONSILLECTOMY  BMI    Body Mass Index: 34.97 kg/m      Reproductive/Obstetrics negative OB ROS                             Anesthesia Physical Anesthesia Plan  ASA: 3  Anesthesia Plan: General ETT   Post-op Pain Management:    Induction: Intravenous  PONV Risk Score and Plan: 3 and Ondansetron  and Dexamethasone   Airway Management Planned: Oral ETT  Additional Equipment:   Intra-op Plan:   Post-operative Plan: Extubation in  OR  Informed Consent: I have reviewed the patients History and Physical, chart, labs and discussed the procedure including the risks, benefits and alternatives for the proposed anesthesia with the patient or authorized representative who has indicated his/her understanding and acceptance.     Dental Advisory Given  Plan Discussed with: Anesthesiologist, CRNA and Surgeon  Anesthesia Plan Comments: (Patient consented for risks of anesthesia including but not limited to:  - adverse reactions to medications - damage to eyes, teeth, lips or other oral mucosa - nerve damage due to positioning  - sore throat or hoarseness - Damage to heart, brain, nerves, lungs, other parts of body or loss of life  Patient voiced understanding and assent.)       Anesthesia Quick Evaluation

## 2023-12-14 NOTE — Anesthesia Procedure Notes (Signed)
 Procedure Name: Intubation Date/Time: 12/14/2023 10:45 AM  Performed by: Bill Budd, CRNAPre-anesthesia Checklist: Patient identified, Patient being monitored, Timeout performed, Emergency Drugs available and Suction available Patient Re-evaluated:Patient Re-evaluated prior to induction Oxygen Delivery Method: Circle system utilized Preoxygenation: Pre-oxygenation with 100% oxygen Induction Type: IV induction Ventilation: Mask ventilation without difficulty Laryngoscope Size: Mac and 4 Grade View: Grade I Tube type: Oral Tube size: 8.5 mm Number of attempts: 1 Airway Equipment and Method: Stylet Placement Confirmation: ETT inserted through vocal cords under direct vision, positive ETCO2 and breath sounds checked- equal and bilateral Secured at: 21 cm Tube secured with: Tape Dental Injury: Teeth and Oropharynx as per pre-operative assessment

## 2023-12-14 NOTE — Interval H&P Note (Signed)
 Alexis Reed has presented today for surgery, with the diagnosis of that.  The various methods of treatment have been discussed with the patient and family. After consideration of risks, benefits and other options for treatment, the patient has consented to  Procedure(s): ROBOTIC ASSISTED NAVIGATIONAL BRONCHOSCOPY AND ENDOBRONCHIAL ULTRASOUND-RIGHT as a surgical intervention.  The patient's history has been reviewed, patient examined, no change in status, stable for surgery.  I have reviewed the patient's chart and labs.  Questions were answered to the patient's satisfaction.  Patient agrees to proceed   C. [name], MD Advanced Bronchoscopy PCCM [facility]    *This note was generated using voice recognition software/Dragon and/or AI transcription program.  Despite best efforts to proofread, errors can occur which can change the meaning. Any transcriptional errors that result from this process are unintentional and may not be fully corrected at the time of dictation.

## 2023-12-14 NOTE — Op Note (Signed)
 PROCEDURES: Robotic assisted bronchoscopy Radial probe ultrasound Endobronchial ultrasound  3D CBCT   Indication: 78 year old former smoker with a 30-pack-year history of smoking with a right upper lobe nodule.  Nodule measuring 13 x 10 mm and now PET avid.  Preoperative Diagnosis: Right upper lobe nodule 13 x 10 mm, rule out cancer Post Procedure Diagnosis: Same as above Consent: Verbal/Written: obtained  Benefits, limitations and potential complications of the procedure were discussed with the patient/family.  Complications from bronchoscopy are rare and most often minor, but if they occur they may include breathing difficulty, vocal cord spasm, hoarseness, slight fever, vomiting, dizziness, bronchospasm, infection, low blood oxygen, bleeding from biopsy site, or an allergic reaction to medications.  It is uncommon for patients to experience other more serious complications for example: Collapsed lung requiring chest tube placement, respiratory failure, heart attack and/or cardiac arrhythmia.  Patient understood the potential complications and agreed to proceed.  Surgeon: Acey Ace, MD Assistant/Scrub: Suszanne Eriksson, RRT Circulator: N/A Anesthesiologist/CRNA: Enrique Harvest, MD/Philip Neldon [place], CRNA Cytotechnology: [facility] pathology, present Fluoroscopy technician: Ree Candy, RT, Lem Pyle, RT Representatives: Dick Fothergill, Intuitive/Ion.  Ion Proctor, Boyd Cabal, MD  Type of Anesthesia: General endotracheal  Procedures Performed:   Robotic bronchoscopy: Procedure consists of robotic navigation comprised of shape sensing technology, optical pattern recognition and robotic kinematic data - to triangulate bronchoscope location during the procedure and provide accurate positional data to biopsy a lesion. Radial EBUS,Linear EBUS. 3D CBCT  Description of Procedure:  Robotic bronchoscopy: The patient was brought to Procedure Room 2 (Bronchoscopy Suite) in the OR  area where appropriate timeout was taken with the staff after the patient was inducted under general anesthesia.  The patient was inducted under general anesthesia and intubated by the anesthesia team.  Patient was intubated with a 8.5 ET tube without difficulty.  Tube was secured at 4 cm above the carina.  A Ion bronchoscopic adapter was placed on the ET tube flange.  Once the patient was under adequate general anesthesia the Olympus therapeutic video bronchoscope was advanced and an anatomic airway tour and surveillance bronchoscopy was performed.The distal trachea appeared unremarkable. The main carina was sharp.  No secretions were seen in either right or left mainstem bronchi. The RUL, RLL, RML appeared to be free of endobronchial masses, lesions, or purulent secretions. Likewise, the LLL/LUL appeared to be free of endobronchial masses, lesions, or purulent secretions.  There were some scant benign appearing secretions on the right upper lobe that were suctioned until cleared.  Once the survey bronchoscopy was completed, the robotic bronchoscope catheter was advanced through the endotracheal tube adapter.  At this point registration was performed with the Ion.  There was good correlation between the robotic mapping and bronchoscopic mapping. With the assistance of shape sensing navigation, the bronchoscope was advanced to the right upper lobe nodule/mass. The tip of the working channel sat within 20 mm of the nodule  Positioning was confirmed with 3D CBCT.  At this point an Ion transbronchial biopsy needle was advanced into the target and a spin was performed with the CBCT to ensure tool in lesion.  Biopsy pass was then obtained with a 23-gauge needle.  The radial ultrasound probe was then inserted and it confirmed concentric imaging consistent with target acquisition.  Utilizing the information from the radial ultrasound probe additional passes with the Ion biopsy needle were obtained.  ROSE noted inflammatory  cells and histiocytes with some atypical cells noted.  A total of 6 passes with the transbronchial biopsy  needle were performed and placed in CytoLyt preservative.  At this point targeted bronchoalveolar lavage was performed of this right upper lobe a total of 20 mL were instilled yielding approximately 6 mL of aliquot.  The aliquot was placed in CytoLyt preservative.    The robotic bronchoscope was then retracted all the way out after confirmation of excellent hemostasis.  At this point examination of the mediastinum was performed with an endobronchial ultrasound linear scope.  Examination of the precarinal, subcarinal hilar stations showed no evidence of pathologic lymphadenopathy.  Once this was completed, the patient received bronchial lavage with 9 mL of 1% lidocaine  via the ET tube.  And the bronchoscope was retrieved and the procedure terminated.  The patient tolerated the procedure well. No significant bleeding was observed at the conclusion of the procedure.  At this point, the patient was allowed to emerge from general anesthesia, and was extubated in the procedure room without incident.  The patient  was taken to the PACU in satisfactory condition.  Auscultation of the lungs showed no change from pre bronchoscopy examination.  Patient tolerated the procedure very well with no untoward effects of anesthesia noted.   Specimens Obtained:  Transbronchial Forceps Biopsy: N/A  Transbronchial Brush: N/A  Targeted BAL:  6 mL aliquot sent for cytology  Transbronchial needle aspirate: X 6, RUL  Fluoroscopy: Augmented fluoroscopy (Body Vision) was utilized during the course of this procedure to assure that biopsies were taken in a safe manner under fluoroscopic guidance with spot films required.  Total fluoroscopy time: 4 minutes 11 seconds, total dose 173 mGy.  Intraoperative images:  Catheter in position taking specimens of the lesion:   Catheter navigation to lesion   Tool in  lesion   Robotic images     Complications:None, no pneumothorax on post film:   Estimated Blood Loss: Nil    Assessment and Plan/Additional Comments: Right upper lobe lesion, rule out malignancy, status post technically successful robotic assisted navigational bronchoscopy with endobronchial ultrasound. Await pathology reports.     Acey Ace, MD Advanced Bronchoscopy PCCM [facility]    *This note was dictated using voice recognition software/Dragon.  Despite best efforts to proofread, errors can occur which can change the meaning.  Any change was purely unintentional.

## 2023-12-15 ENCOUNTER — Encounter: Payer: Self-pay | Admitting: Pulmonary Disease

## 2023-12-15 ENCOUNTER — Encounter: Payer: Self-pay | Admitting: Internal Medicine

## 2023-12-15 NOTE — Anesthesia Postprocedure Evaluation (Signed)
 Anesthesia Post Note  Patient: Alexis Reed  Procedure(s) Performed: BRONCHOSCOPY, WITH BIOPSY USING ELECTROMAGNETIC NAVIGATION (Right) BRONCHOSCOPY, WITH EBUS (Right)  Patient location during evaluation: PACU Anesthesia Type: General Level of consciousness: awake and alert Pain management: pain level controlled Vital Signs Assessment: post-procedure vital signs reviewed and stable Respiratory status: spontaneous breathing, nonlabored ventilation, respiratory function stable and patient connected to nasal cannula oxygen Cardiovascular status: blood pressure returned to baseline and stable Postop Assessment: no apparent nausea or vomiting Anesthetic complications: no   No notable events documented.   Last Vitals:  Vitals:   12/14/23 1245 12/14/23 1246  BP: 114/76 (!) 145/65  Pulse: 81 86  Resp: 11 17  Temp:  (!) 36.1 C  SpO2: 90% 95%    Last Pain:  Vitals:   12/14/23 1246  TempSrc: Temporal  PainSc: 0-No pain                 Enrique Harvest

## 2023-12-16 ENCOUNTER — Ambulatory Visit: Payer: Self-pay | Admitting: Pulmonary Disease

## 2023-12-16 ENCOUNTER — Telehealth: Payer: Self-pay | Admitting: Pulmonary Disease

## 2023-12-16 DIAGNOSIS — C3491 Malignant neoplasm of unspecified part of right bronchus or lung: Secondary | ICD-10-CM

## 2023-12-16 DIAGNOSIS — R911 Solitary pulmonary nodule: Secondary | ICD-10-CM

## 2023-12-16 LAB — CYTOLOGY - NON PAP

## 2023-12-16 NOTE — Telephone Encounter (Signed)
 LM for patient

## 2023-12-16 NOTE — Telephone Encounter (Signed)
 Noted

## 2023-12-16 NOTE — Telephone Encounter (Signed)
 PT received a call this morning from Dr Viva Grise. PT attempted to call back but was unable to reach Dr. Viva Grise. PT states they will be available this afternoon to talk. PT states the best number to reach them is at [phone].

## 2023-12-16 NOTE — Telephone Encounter (Signed)
 See results note.  Spoke with patient, referred to radiation therapy for SBRT to stage I A2 non-small cell carcinoma of right upper lobe.  Patient is not operative candidate by FEV1 criteria.

## 2023-12-16 NOTE — Telephone Encounter (Signed)
 Alexis Reed has rescheduled appt

## 2023-12-22 ENCOUNTER — Ambulatory Visit: Admitting: Pulmonary Disease

## 2023-12-24 ENCOUNTER — Ambulatory Visit
Admission: RE | Admit: 2023-12-24 | Discharge: 2023-12-24 | Disposition: A | Discharge status: Home / Self Care | Source: Ambulatory Visit | Attending: Radiation Oncology | Admitting: Radiation Oncology

## 2023-12-24 ENCOUNTER — Encounter: Payer: Self-pay | Admitting: Radiation Oncology

## 2023-12-24 ENCOUNTER — Encounter: Payer: Self-pay | Admitting: *Deleted

## 2023-12-24 VITALS — BP 142/88 | HR 91 | Resp 16 | Ht 62.0 in | Wt 191.0 lb

## 2023-12-24 DIAGNOSIS — Z510 Encounter for antineoplastic radiation therapy: Secondary | ICD-10-CM | POA: Insufficient documentation

## 2023-12-24 DIAGNOSIS — C3411 Malignant neoplasm of upper lobe, right bronchus or lung: Secondary | ICD-10-CM | POA: Insufficient documentation

## 2023-12-24 NOTE — Progress Notes (Signed)
 NEW PATIENT EVALUATION  Name: Alexis Reed  MRN: [id number]  Date:   12/24/2023     DOB: [birth year]   This 78 y.o. female patient presents to the clinic for initial evaluation of stage I A2 non-small cell lung cancer of the right upper lobe.  REFERRING PHYSICIAN: Marc Senior, MD  CHIEF COMPLAINT:  Chief Complaint  Patient presents with   Lung Cancer    DIAGNOSIS: The encounter diagnosis was Malignant neoplasm of upper lobe, right bronchus or lung (HCC).   PREVIOUS INVESTIGATIONS:  PET/CT and CT scans reviewed Pathology reports reviewed Clinical notes reviewed  HPI: Patient is an 78 year old female being followed by Dr. Viva Grise for increasing right upper lobe lung nodule.  First was presented in December 23 showed no hypermetabolic activity at that time.  CT scan in April 2025 showed significant increase in size and mild spiculation measuring 1.3 x 1.0 cm.  PET scan in May showed hypermetabolic activity of the lesion with no evidence of mediastinal or distant metastatic disease.  Patient underwent bronchoscopy of the lesion which was positive for non-small cell lung cancer.  According to Dr. Viva Grise her pulmonary functions are not suitable for surgical resection.  She is fairly asymptomatic does have a dry cough since her bronchoscopy I have asked her to start using over-the-counter Mucinex.  PLANNED TREATMENT REGIMEN: SBRT treatment  PAST MEDICAL HISTORY:  has a past medical history of Arthritis, Benign breast lumps, COPD (chronic obstructive pulmonary disease) (HCC), Emphysema lung (HCC), Fibroids, GERD (gastroesophageal reflux disease), Hypercholesterolemia, Lung nodule, Osteoporosis, Personal history of tobacco use, presenting hazards to health (08/07/2015), Stroke (HCC) (2023), and Urinary tract infection.    PAST SURGICAL HISTORY:  Past Surgical History:  Procedure Laterality Date   ABDOMINAL HYSTERECTOMY  1997   with bilateral oophorectomy   BREAST BIOPSY Left     multiple done   BREAST BIOPSY Right 2016   stereo- neg. FC changes   BREAST BIOPSY Left 06/02/2023   US  LT BREAST BX W LOC DEV 1ST LESION IMG BX SPEC US  GUIDE 06/02/2023 [facility]   BREAST EXCISIONAL BIOPSY Right 1975   multiple biopsies done   BREAST SURGERY Left    lump removed. Unsure of date   BREAST SURGERY Right    lump removed. Unsure of date   BRONCHOSCOPY, WITH BIOPSY USING ELECTROMAGNETIC NAVIGATION Right 12/14/2023   Procedure: BRONCHOSCOPY, WITH BIOPSY USING ELECTROMAGNETIC NAVIGATION;  Surgeon: Marc Senior, MD;  Location: [facility];  Service: Pulmonary;  Laterality: Right;   COLONOSCOPY  2010   Dr. Felicita Horns   COLONOSCOPY WITH PROPOFOL  N/A 06/17/2021   Procedure: COLONOSCOPY WITH PROPOFOL ;  Surgeon: Selena Daily, MD;  Location: [facility];  Service: Gastroenterology;  Laterality: N/A;   ESOPHAGOGASTRODUODENOSCOPY (EGD) WITH PROPOFOL   06/17/2021   Procedure: ESOPHAGOGASTRODUODENOSCOPY (EGD) WITH PROPOFOL ;  Surgeon: Selena Daily, MD;  Location: [facility];  Service: Gastroenterology;;   KNEE ARTHROSCOPY WITH LATERAL MENISECTOMY Right 05/08/2020   Procedure: RIGHT KNEE ARTHROSCOPY WITH DEBRIDEMENT AND PARTIAL LATERAL MENISCECTOMY;  Surgeon: Elner Hahn, MD;  Location: [facility];  Service: Orthopedics;  Laterality: Right;   LAPAROSCOPIC APPENDECTOMY N/A 12/21/2018   Procedure: APPENDECTOMY LAPAROSCOPIC CONVERTED TO OPEN;  Surgeon: Conrado Delay, DO;  Location: [facility];  Service: General;  Laterality: N/A;   TONSILLECTOMY     VIDEO BRONCHOSCOPY WITH ENDOBRONCHIAL ULTRASOUND Right 12/14/2023   Procedure: BRONCHOSCOPY, WITH EBUS;  Surgeon: Marc Senior, MD;  Location: [facility];  Service: Pulmonary;  Laterality: Right;    FAMILY  HISTORY: family history includes Colon cancer in her father; Heart disease in her father; Hyperlipidemia in her mother; Hypertension in her mother; Stroke in her father; Thyroid  disease in her mother; Urinary tract  infection in her mother.  SOCIAL HISTORY:  reports that she quit smoking about 19 years ago. Her smoking use included cigarettes. She started smoking about 49 years ago. She has a 30 pack-year smoking history. She has been exposed to tobacco smoke. She has never used smokeless tobacco. She reports current alcohol use. She reports that she does not use drugs.  ALLERGIES: Benzoin, Boniva [ibandronate], Codeine, Fosamax [alendronate sodium], Lipitor [atorvastatin ], Meloxicam , and Penicillins  MEDICATIONS:  Current Outpatient Medications  Medication Sig Dispense Refill   acetaminophen  (TYLENOL ) 500 MG tablet Take 1,000 mg by mouth every 6 (six) hours as needed for moderate pain.     albuterol  (VENTOLIN  HFA) 108 (90 Base) MCG/ACT inhaler Inhale 2 puffs into the lungs every 6 (six) hours as needed for wheezing or shortness of breath. 8 g 2   aspirin  EC 81 MG EC tablet Take 1 tablet (81 mg total) by mouth daily. Swallow whole.     Calcium  Carb-Cholecalciferol  (CALCIUM  500 +D) 500-400 MG-UNIT TABS Take 1 tablet by mouth daily.     Cholecalciferol  (VITAMIN D ) 2000 UNITS tablet Take 2,000 Units by mouth daily.     Cranberry 450 MG CAPS Take 450 mg by mouth daily.     denosumab  (PROLIA ) 60 MG/ML SOSY injection Inject 60 mg into the skin every 6 (six) months.     ibuprofen  (ADVIL ) 800 MG tablet Take 800 mg by mouth as needed.     omeprazole  (PRILOSEC) 20 MG capsule Take 1 capsule (20 mg total) by mouth daily. 90 capsule 3   pravastatin  (PRAVACHOL ) 80 MG tablet Take 1 tablet (80 mg total) by mouth daily. 90 tablet 3   traMADol  (ULTRAM ) 50 MG tablet Take 50 mg by mouth as needed.     TRELEGY ELLIPTA  100-62.5-25 MCG/ACT AEPB USE ONE INHALATION INTO THE LUNGS ONCE A DAY RINSE MOUTH AFTER EACH USE 60 each 5   Current Facility-Administered Medications  Medication Dose Route Frequency Provider Last Rate Last Admin   [START ON 02/10/2024] denosumab  (PROLIA ) injection 60 mg  60 mg Subcutaneous Once Dellar Fenton, MD        ECOG PERFORMANCE STATUS:  0 - Asymptomatic  REVIEW OF SYSTEMS: Patient denies any weight loss, fatigue, weakness, fever, chills or night sweats. Patient denies any loss of vision, blurred vision. Patient denies any ringing  of the ears or hearing loss. No irregular heartbeat. Patient denies heart murmur or history of fainting. Patient denies any chest pain or pain radiating to her upper extremities. Patient denies any shortness of breath, difficulty breathing at night, cough or hemoptysis. Patient denies any swelling in the lower legs. Patient denies any nausea vomiting, vomiting of blood, or coffee ground material in the vomitus. Patient denies any stomach pain. Patient states has had normal bowel movements no significant constipation or diarrhea. Patient denies any dysuria, hematuria or significant nocturia. Patient denies any problems walking, swelling in the joints or loss of balance. Patient denies any skin changes, loss of hair or loss of weight. Patient denies any excessive worrying or anxiety or significant depression. Patient denies any problems with insomnia. Patient denies excessive thirst, polyuria, polydipsia. Patient denies any swollen glands, patient denies easy bruising or easy bleeding. Patient denies any recent infections, allergies or URI. Patient s visual fields have not changed significantly  in recent time.   PHYSICAL EXAM: BP (!) 142/88   Pulse 91   Resp 16   Ht 5' 2 (1.575 m)   Wt 191 lb (86.6 kg)   LMP 07/20/1995   BMI 34.93 kg/m  Well-developed well-nourished patient in NAD. HEENT reveals PERLA, EOMI, discs not visualized.  Oral cavity is clear. No oral mucosal lesions are identified. Neck is clear without evidence of cervical or supraclavicular adenopathy. Lungs are clear to A&P. Cardiac examination is essentially unremarkable with regular rate and rhythm without murmur rub or thrill. Abdomen is benign with no organomegaly or masses noted. Motor  sensory and DTR levels are equal and symmetric in the upper and lower extremities. Cranial nerves II through XII are grossly intact. Proprioception is intact. No peripheral adenopathy or edema is identified. No motor or sensory levels are noted. Crude visual fields are within normal range.  LABORATORY DATA: Pathology reports reviewed    RADIOLOGY RESULTS: CT scans and PET CT scans all reviewed compatible with above-stated findings   IMPRESSION: Stage I A2 non-small cell lung cancer of the right upper lobe in a 78 year old female  PLAN: At this time I am planning SBRT to the right upper lobe nodule.  Would plan on delivering 60 Gray in 5 fractions.  Risks and benefits of treatment including low side effect profile possibly developing a cough several weeks out from treatment fatigue skin reaction all were reviewed in detail with the patient and her family.  They will comprehend my treatment plan well.  I have personally set up and ordered CT simulation for next week.  Patient comprehends my recommendations well.  I would like to take this opportunity to thank you for allowing me to participate in the care of your patient.Glenis Langdon, MD

## 2023-12-31 ENCOUNTER — Ambulatory Visit
Admission: RE | Admit: 2023-12-31 | Discharge: 2023-12-31 | Disposition: A | Discharge status: Home / Self Care | Source: Ambulatory Visit | Attending: Radiation Oncology | Admitting: Radiation Oncology

## 2023-12-31 ENCOUNTER — Encounter: Payer: Self-pay | Admitting: *Deleted

## 2023-12-31 ENCOUNTER — Ambulatory Visit

## 2023-12-31 DIAGNOSIS — Z510 Encounter for antineoplastic radiation therapy: Secondary | ICD-10-CM | POA: Diagnosis present

## 2023-12-31 DIAGNOSIS — C3411 Malignant neoplasm of upper lobe, right bronchus or lung: Secondary | ICD-10-CM | POA: Diagnosis present

## 2024-01-01 DIAGNOSIS — Z510 Encounter for antineoplastic radiation therapy: Secondary | ICD-10-CM | POA: Diagnosis not present

## 2024-01-07 ENCOUNTER — Encounter: Payer: Self-pay | Admitting: Pulmonary Disease

## 2024-01-11 ENCOUNTER — Other Ambulatory Visit: Payer: Self-pay

## 2024-01-11 ENCOUNTER — Encounter: Payer: Self-pay | Admitting: *Deleted

## 2024-01-11 ENCOUNTER — Other Ambulatory Visit (HOSPITAL_COMMUNITY): Payer: Self-pay

## 2024-01-11 ENCOUNTER — Telehealth: Payer: Self-pay

## 2024-01-11 ENCOUNTER — Ambulatory Visit
Admission: RE | Admit: 2024-01-11 | Discharge: 2024-01-11 | Disposition: A | Discharge status: Home / Self Care | Source: Ambulatory Visit | Attending: Radiation Oncology | Admitting: Radiation Oncology

## 2024-01-11 DIAGNOSIS — Z510 Encounter for antineoplastic radiation therapy: Secondary | ICD-10-CM | POA: Diagnosis not present

## 2024-01-11 LAB — RAD ONC ARIA SESSION SUMMARY
Course Elapsed Days: 0
Plan Fractions Treated to Date: 1
Plan Prescribed Dose Per Fraction: 12 Gy
Plan Total Fractions Prescribed: 5
Plan Total Prescribed Dose: 60 Gy
Reference Point Dosage Given to Date: 12 Gy
Reference Point Session Dosage Given: 12 Gy
Session Number: 1

## 2024-01-11 NOTE — Telephone Encounter (Signed)
 PA for medical submitted via [employer]. Authorization Number : [id number]   PHARMACY BENEFIT: $60

## 2024-01-11 NOTE — Telephone Encounter (Signed)
 SABRA

## 2024-01-11 NOTE — Telephone Encounter (Signed)
 Pt ready for scheduling for PROLIA  on or after : 02/10/24  Option# 1: Buy/Bill (Office supplied medication)  Out-of-pocket cost due at time of clinic visit: $80  Number of injection/visits approved: 2  Primary: [employer] Prolia  co-insurance: $40 Admin fee co-insurance: $40  Secondary: --- Prolia  co-insurance:  Admin fee co-insurance:   Medical Benefit Details: Date Benefits were checked: 01/11/24 Deductible: $200 Met of $200 Required/ Coinsurance: $40/ Admin Fee: $40  Prior Auth: APPROVED PA# [id number] Expiration Date: 01/11/24-01/10/25   # of doses approved: 2 ----------------------------------------------------------------------- Option# 2- Med Obtained from pharmacy:  Pharmacy benefit: Copay $60 (Paid to pharmacy) Admin Fee: $40 (Pay at clinic)  Prior Auth: N/A PA# Expiration Date:   # of doses approved:   If patient wants fill through the pharmacy benefit please send prescription to: [employer], and include estimated need by date in rx notes. Pharmacy will ship medication directly to the office.  Patient NOT eligible for Prolia  Copay Card. Copay Card can make patient's cost as little as $25. Link to apply: [email or web address]://[email or web address]  ** This summary of benefits is an estimation of the patient's out-of-pocket cost. Exact cost may very based on individual plan coverage.

## 2024-01-11 NOTE — Telephone Encounter (Signed)
 Prolia  VOB initiated via [email or web address]  Next Prolia  inj DUE: 02/10/24

## 2024-01-12 ENCOUNTER — Encounter: Payer: Self-pay | Admitting: *Deleted

## 2024-01-13 ENCOUNTER — Other Ambulatory Visit: Payer: Self-pay

## 2024-01-13 ENCOUNTER — Ambulatory Visit

## 2024-01-13 ENCOUNTER — Ambulatory Visit
Admission: RE | Admit: 2024-01-13 | Discharge: 2024-01-13 | Disposition: A | Discharge status: Home / Self Care | Source: Ambulatory Visit | Attending: Radiation Oncology | Admitting: Radiation Oncology

## 2024-01-13 DIAGNOSIS — C3411 Malignant neoplasm of upper lobe, right bronchus or lung: Secondary | ICD-10-CM | POA: Diagnosis present

## 2024-01-13 DIAGNOSIS — Z510 Encounter for antineoplastic radiation therapy: Secondary | ICD-10-CM | POA: Diagnosis present

## 2024-01-13 LAB — RAD ONC ARIA SESSION SUMMARY
Course Elapsed Days: 2
Plan Fractions Treated to Date: 2
Plan Prescribed Dose Per Fraction: 12 Gy
Plan Total Fractions Prescribed: 5
Plan Total Prescribed Dose: 60 Gy
Reference Point Dosage Given to Date: 24 Gy
Reference Point Session Dosage Given: 12 Gy
Session Number: 2

## 2024-01-14 ENCOUNTER — Ambulatory Visit

## 2024-01-18 ENCOUNTER — Ambulatory Visit

## 2024-01-19 ENCOUNTER — Other Ambulatory Visit: Payer: Self-pay

## 2024-01-19 ENCOUNTER — Ambulatory Visit
Admission: RE | Admit: 2024-01-19 | Discharge: 2024-01-19 | Disposition: A | Discharge status: Home / Self Care | Source: Ambulatory Visit | Attending: Radiation Oncology | Admitting: Radiation Oncology

## 2024-01-19 DIAGNOSIS — Z510 Encounter for antineoplastic radiation therapy: Secondary | ICD-10-CM | POA: Diagnosis not present

## 2024-01-19 LAB — RAD ONC ARIA SESSION SUMMARY
Course Elapsed Days: 8
Plan Fractions Treated to Date: 3
Plan Prescribed Dose Per Fraction: 12 Gy
Plan Total Fractions Prescribed: 5
Plan Total Prescribed Dose: 60 Gy
Reference Point Dosage Given to Date: 36 Gy
Reference Point Session Dosage Given: 12 Gy
Session Number: 3

## 2024-01-20 ENCOUNTER — Ambulatory Visit

## 2024-01-21 ENCOUNTER — Other Ambulatory Visit: Payer: Self-pay

## 2024-01-21 ENCOUNTER — Ambulatory Visit
Admission: RE | Admit: 2024-01-21 | Discharge: 2024-01-21 | Disposition: A | Discharge status: Home / Self Care | Source: Ambulatory Visit | Attending: Radiation Oncology | Admitting: Radiation Oncology

## 2024-01-21 DIAGNOSIS — Z510 Encounter for antineoplastic radiation therapy: Secondary | ICD-10-CM | POA: Diagnosis not present

## 2024-01-21 LAB — RAD ONC ARIA SESSION SUMMARY
Course Elapsed Days: 10
Plan Fractions Treated to Date: 4
Plan Prescribed Dose Per Fraction: 12 Gy
Plan Total Fractions Prescribed: 5
Plan Total Prescribed Dose: 60 Gy
Reference Point Dosage Given to Date: 48 Gy
Reference Point Session Dosage Given: 12 Gy
Session Number: 4

## 2024-01-25 ENCOUNTER — Ambulatory Visit
Admission: RE | Admit: 2024-01-25 | Discharge: 2024-01-25 | Disposition: A | Discharge status: Home / Self Care | Source: Ambulatory Visit | Attending: Radiation Oncology | Admitting: Radiation Oncology

## 2024-01-25 ENCOUNTER — Ambulatory Visit

## 2024-01-25 ENCOUNTER — Other Ambulatory Visit: Payer: Self-pay

## 2024-01-25 DIAGNOSIS — Z510 Encounter for antineoplastic radiation therapy: Secondary | ICD-10-CM | POA: Diagnosis not present

## 2024-01-25 LAB — RAD ONC ARIA SESSION SUMMARY
Course Elapsed Days: 14
Plan Fractions Treated to Date: 5
Plan Prescribed Dose Per Fraction: 12 Gy
Plan Total Fractions Prescribed: 5
Plan Total Prescribed Dose: 60 Gy
Reference Point Dosage Given to Date: 60 Gy
Reference Point Session Dosage Given: 12 Gy
Session Number: 5

## 2024-01-26 ENCOUNTER — Other Ambulatory Visit: Payer: Self-pay | Admitting: Pulmonary Disease

## 2024-01-26 DIAGNOSIS — J449 Chronic obstructive pulmonary disease, unspecified: Secondary | ICD-10-CM

## 2024-01-26 NOTE — Radiation Completion Notes (Signed)
 Patient Name: Alexis Reed, Alexis Reed MRN: [id number] Date of Birth: 12/16/1945 Referring Physician: ALLENA HAMILTON, M.D. Date of Service: 2024-01-26 Radiation Oncologist: Marcey Penton, M.D. [facility] [place]                             RADIATION ONCOLOGY END OF TREATMENT NOTE     Diagnosis: C34.11 Malignant neoplasm of upper lobe, right bronchus or lung Intent: Curative     HPI: Patient is an 78 year old female being followed by Dr. Tamea for increasing right upper lobe lung nodule.  First was presented in December 23 showed no hypermetabolic activity at that time.  CT scan in April 2025 showed significant increase in size and mild spiculation measuring 1.3 x 1.0 cm.  PET scan in May showed hypermetabolic activity of the lesion with no evidence of mediastinal or distant metastatic disease.  Patient underwent bronchoscopy of the lesion which was positive for non-small cell lung cancer.  According to Dr. Tamea her pulmonary functions are not suitable for surgical resection.  She is fairly asymptomatic does have a dry cough since her bronchoscopy I have asked her to start using over-the-counter Mucinex.      ==========DELIVERED PLANS==========  First Treatment Date: 2024-01-11 Last Treatment Date: 2024-01-25   Plan Name: Lung_R_SBRT Site: Lung, Right Technique: SBRT/SRT-IMRT Mode: Photon Dose Per Fraction: 12 Gy Prescribed Dose (Delivered / Prescribed): 60 Gy / 60 Gy Prescribed Fxs (Delivered / Prescribed): 5 / 5     ==========ON TREATMENT VISIT DATES========== 2024-01-11, 2024-01-13, 2024-01-19, 2024-01-19, 2024-01-21, 2024-01-25, 2024-01-25     ==========UPCOMING VISITS==========       ==========APPENDIX - ON TREATMENT VISIT NOTES==========   See weekly On Treatment Notes in Epic for details in the Media tab (listed as Progress notes on the On Treatment Visit Dates listed above).

## 2024-01-27 ENCOUNTER — Ambulatory Visit

## 2024-02-01 ENCOUNTER — Ambulatory Visit

## 2024-02-08 ENCOUNTER — Ambulatory Visit

## 2024-02-10 ENCOUNTER — Ambulatory Visit

## 2024-02-10 ENCOUNTER — Telehealth: Payer: Self-pay | Admitting: *Deleted

## 2024-02-10 NOTE — Telephone Encounter (Signed)
 Called patient regarding rash on chest advised patient to stop Cortaid and use Benadryl Gel as directed on product. Patient to call clinic if no improvement by Friday. Patient verbalized understanding.

## 2024-02-15 ENCOUNTER — Ambulatory Visit

## 2024-02-16 ENCOUNTER — Other Ambulatory Visit

## 2024-02-17 ENCOUNTER — Ambulatory Visit

## 2024-02-17 ENCOUNTER — Ambulatory Visit: Payer: Medicare HMO

## 2024-02-17 DIAGNOSIS — E7800 Pure hypercholesterolemia, unspecified: Secondary | ICD-10-CM

## 2024-02-17 DIAGNOSIS — E559 Vitamin D deficiency, unspecified: Secondary | ICD-10-CM

## 2024-02-17 DIAGNOSIS — M810 Age-related osteoporosis without current pathological fracture: Secondary | ICD-10-CM

## 2024-02-17 LAB — HEPATIC FUNCTION PANEL
ALT: 12 U/L (ref 0–35)
AST: 14 U/L (ref 0–37)
Albumin: 4.2 g/dL (ref 3.5–5.2)
Alkaline Phosphatase: 81 U/L (ref 39–117)
Bilirubin, Direct: 0.1 mg/dL (ref 0.0–0.3)
Total Bilirubin: 0.7 mg/dL (ref 0.2–1.2)
Total Protein: 7.1 g/dL (ref 6.0–8.3)

## 2024-02-17 LAB — BASIC METABOLIC PANEL WITH GFR
BUN: 17 mg/dL (ref 6–23)
CO2: 31 meq/L (ref 19–32)
Calcium: 9.6 mg/dL (ref 8.4–10.5)
Chloride: 101 meq/L (ref 96–112)
Creatinine, Ser: 0.83 mg/dL (ref 0.40–1.20)
GFR: 68.7 mL/min (ref >60.00)
Glucose, Bld: 98 mg/dL (ref 70–99)
Potassium: 4.1 meq/L (ref 3.5–5.1)
Sodium: 140 meq/L (ref 135–145)

## 2024-02-17 LAB — LIPID PANEL
Cholesterol: 189 mg/dL (ref 0–200)
HDL: 67 mg/dL (ref >39.00)
LDL Cholesterol: 102 mg/dL — ABNORMAL HIGH (ref 0–99)
NonHDL: 121.93
Total CHOL/HDL Ratio: 3
Triglycerides: 102 mg/dL (ref 0.0–149.0)
VLDL: 20.4 mg/dL (ref 0.0–40.0)

## 2024-02-17 LAB — VITAMIN D 25 HYDROXY (VIT D DEFICIENCY, FRACTURES): VITD: 51.06 ng/mL (ref 30.00–100.00)

## 2024-02-17 MED ORDER — DENOSUMAB 60 MG/ML ~~LOC~~ SOSY
60.0000 mg | PREFILLED_SYRINGE | SUBCUTANEOUS | Status: AC
Start: 2024-02-17 — End: ?

## 2024-02-17 NOTE — Progress Notes (Signed)
 Patient was administered a Prolia  injection into her right arm. Patient tolerated the Prolia  injection well.

## 2024-02-18 ENCOUNTER — Ambulatory Visit: Payer: Self-pay | Admitting: Internal Medicine

## 2024-02-24 ENCOUNTER — Ambulatory Visit (INDEPENDENT_AMBULATORY_CARE_PROVIDER_SITE_OTHER): Admitting: Internal Medicine

## 2024-02-24 ENCOUNTER — Encounter: Payer: Self-pay | Admitting: Internal Medicine

## 2024-02-24 VITALS — BP 120/62 | HR 97 | Temp 97.7°F | Ht 62.0 in | Wt 190.4 lb

## 2024-02-24 DIAGNOSIS — F439 Reaction to severe stress, unspecified: Secondary | ICD-10-CM

## 2024-02-24 DIAGNOSIS — J449 Chronic obstructive pulmonary disease, unspecified: Secondary | ICD-10-CM

## 2024-02-24 DIAGNOSIS — D649 Anemia, unspecified: Secondary | ICD-10-CM

## 2024-02-24 DIAGNOSIS — C3490 Malignant neoplasm of unspecified part of unspecified bronchus or lung: Secondary | ICD-10-CM

## 2024-02-24 DIAGNOSIS — E041 Nontoxic single thyroid nodule: Secondary | ICD-10-CM | POA: Diagnosis not present

## 2024-02-24 DIAGNOSIS — Z8673 Personal history of transient ischemic attack (TIA), and cerebral infarction without residual deficits: Secondary | ICD-10-CM

## 2024-02-24 DIAGNOSIS — Z860100 Personal history of colon polyps, unspecified: Secondary | ICD-10-CM

## 2024-02-24 DIAGNOSIS — E559 Vitamin D deficiency, unspecified: Secondary | ICD-10-CM

## 2024-02-24 DIAGNOSIS — E7800 Pure hypercholesterolemia, unspecified: Secondary | ICD-10-CM

## 2024-02-24 DIAGNOSIS — K219 Gastro-esophageal reflux disease without esophagitis: Secondary | ICD-10-CM

## 2024-02-24 NOTE — Progress Notes (Signed)
 Subjective:    Patient ID: Alexis Reed, female    DOB: 07-30-1945, 78 y.o.   MRN: [id number]  Patient here for  Chief Complaint  Patient presents with   Medical Management of Chronic Issues    4 mo f/u    HPI Here for a scheduled follow up. Had f/u with Dr Maree 09/01/23. F/u - h/o CVA. Recommended to continue aspirin  and pravastatin . Also discussed MCI. Consider donepezil. Seeing Dr Damian for f/u thyroid  nodules. Recommended f/u ultrasound. Has been seeing Dr Tamea for f/u lung nodule. Recent biopsy - non-small cell carcinoma. Recommended XRT. Saw Dr Camelia - SBRT to the right upper lobe nodule - completed. Occasional dry cough. No chest congestion. Continues f/u with Dr Camelia. Saw Dr Tye - f/u left breast mass. Recommended f/u 6 months - repeat diagnostic mammogram.    Past Medical History:  Diagnosis Date   Arthritis    Benign breast lumps    multiple lumps biopsy and remove x's 5 last being 1979 by Dr. Rollene   COPD (chronic obstructive pulmonary disease) (HCC)    MILD-RARELY EVER HAS TO USE COMBIVENT  INHALER   Emphysema lung (HCC)    Fibroids    s/p hysterectomy   GERD (gastroesophageal reflux disease)    Hypercholesterolemia    Lung nodule    Osteoporosis    Personal history of tobacco use, presenting hazards to health 08/07/2015   Stroke [facility]) 2023   Urinary tract infection    Past Surgical History:  Procedure Laterality Date   ABDOMINAL HYSTERECTOMY  1997   with bilateral oophorectomy   BREAST BIOPSY Left    multiple done   BREAST BIOPSY Right 2016   stereo- neg. FC changes   BREAST BIOPSY Left 06/02/2023   US  LT BREAST BX W LOC DEV 1ST LESION IMG BX SPEC US  GUIDE 06/02/2023 [facility]   BREAST EXCISIONAL BIOPSY Right 1975   multiple biopsies done   BREAST SURGERY Left    lump removed. Unsure of date   BREAST SURGERY Right    lump removed. Unsure of date   BRONCHOSCOPY, WITH BIOPSY USING ELECTROMAGNETIC NAVIGATION Right 12/14/2023    Procedure: BRONCHOSCOPY, WITH BIOPSY USING ELECTROMAGNETIC NAVIGATION;  Surgeon: Tamea Dedra CROME, MD;  Location: [facility] ORS;  Service: Pulmonary;  Laterality: Right;   COLONOSCOPY  2010   Dr. Viktoria   COLONOSCOPY WITH PROPOFOL  N/A 06/17/2021   Procedure: COLONOSCOPY WITH PROPOFOL ;  Surgeon: Unk Corinn Skiff, MD;  Location: [facility] ENDOSCOPY;  Service: Gastroenterology;  Laterality: N/A;   ESOPHAGOGASTRODUODENOSCOPY (EGD) WITH PROPOFOL   06/17/2021   Procedure: ESOPHAGOGASTRODUODENOSCOPY (EGD) WITH PROPOFOL ;  Surgeon: Unk Corinn Skiff, MD;  Location: [facility] ENDOSCOPY;  Service: Gastroenterology;;   KNEE ARTHROSCOPY WITH LATERAL MENISECTOMY Right 05/08/2020   Procedure: RIGHT KNEE ARTHROSCOPY WITH DEBRIDEMENT AND PARTIAL LATERAL MENISCECTOMY;  Surgeon: Edie Norleen PARAS, MD;  Location: [facility] ORS;  Service: Orthopedics;  Laterality: Right;   LAPAROSCOPIC APPENDECTOMY N/A 12/21/2018   Procedure: APPENDECTOMY LAPAROSCOPIC CONVERTED TO OPEN;  Surgeon: Tye Millet, DO;  Location: [facility] ORS;  Service: General;  Laterality: N/A;   TONSILLECTOMY     VIDEO BRONCHOSCOPY WITH ENDOBRONCHIAL ULTRASOUND Right 12/14/2023   Procedure: BRONCHOSCOPY, WITH EBUS;  Surgeon: Tamea Dedra CROME, MD;  Location: [facility] ORS;  Service: Pulmonary;  Laterality: Right;   Family History  Problem Relation Age of Onset   Hyperlipidemia Mother    Thyroid  disease Mother    Hypertension Mother    Urinary tract infection Mother    Colon cancer Father  Stroke Father    Heart disease Father        myocardial infarction   Breast cancer Neg Hx    Social History   Socioeconomic History   Marital status: Married    Spouse name: Not on file   Number of children: 2   Years of education: Not on file   Highest education level: Some college, no degree  Occupational History   Not on file  Tobacco Use   Smoking status: Former    Current packs/day: 0.00    Average packs/day: 1 pack/day for 30.0 years (30.0 ttl pk-yrs)    Types:  Cigarettes    Start date: 04/26/1974    Quit date: 04/26/2004    Years since quitting: 19.8    Passive exposure: Past   Smokeless tobacco: Never  Vaping Use   Vaping status: Never Used  Substance and Sexual Activity   Alcohol use: Yes    Alcohol/week: 0.0 standard drinks of alcohol    Comment: occassional wine every other day   Drug use: No   Sexual activity: Yes    Birth control/protection: Post-menopausal  Other Topics Concern   Not on file  Social History Narrative   She is married, has two children. 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   Social Drivers of Corporate investment banker Strain: Low Risk  (10/22/2023)   Overall Financial Resource Strain (CARDIA)    Difficulty of Paying Living Expenses: Not hard at all  Food Insecurity: No Food Insecurity (10/22/2023)   Hunger Vital Sign    Worried About Running Out of Food in the Last Year: Never true    Ran Out of Food in the Last Year: Never true  Transportation Needs: No Transportation Needs (10/22/2023)   PRAPARE - Administrator, Civil Service (Medical): No    Lack of Transportation (Non-Medical): No  Physical Activity: Insufficiently Active (10/22/2023)   Exercise Vital Sign    Days of Exercise per Week: 3 days    Minutes of Exercise per Session: 30 min  Stress: No Stress Concern Present (10/22/2023)   [employer] of Occupational Health - Occupational Stress Questionnaire    Feeling of Stress : Not at all  Social Connections: Socially Integrated (10/22/2023)   Social Connection and Isolation Panel    Frequency of Communication with Friends and Family: More than three times a week    Frequency of Social Gatherings with Friends and Family: Twice a week    Attends Religious Services: More than 4 times per year    Active Member of [employer] or Organizations: Yes    Attends Banker Meetings: 1 to 4 times per year    Marital Status: Married     Review of Systems  Constitutional:   Negative for appetite change and unexpected weight change.  HENT:  Negative for congestion and sinus pressure.   Respiratory:  Negative for chest tightness and shortness of breath.        Occasional dry cough.   Cardiovascular:  Negative for chest pain, palpitations and leg swelling.  Gastrointestinal:  Negative for abdominal pain, diarrhea, nausea and vomiting.  Genitourinary:  Negative for difficulty urinating and dysuria.  Musculoskeletal:  Negative for joint swelling and myalgias.  Skin:  Negative for color change and rash.  Neurological:  Negative for dizziness and headaches.  Psychiatric/Behavioral:  Negative for agitation and dysphoric mood.        Objective:     BP 120/62 (BP Location: Left  Arm, Patient Position: Sitting, Cuff Size: Normal)   Pulse 97   Temp 97.7 F (36.5 C) (Oral)   Ht 5' 2 (1.575 m)   Wt 190 lb 6.4 oz (86.4 kg)   LMP 07/20/1995   SpO2 96%   BMI 34.82 kg/m  Wt Readings from Last 3 Encounters:  02/25/24 192 lb (87.1 kg)  02/24/24 190 lb 6.4 oz (86.4 kg)  12/24/23 191 lb (86.6 kg)    Physical Exam Vitals reviewed.  Constitutional:      General: She is not in acute distress.    Appearance: Normal appearance.  HENT:     Head: Normocephalic and atraumatic.     Right Ear: External ear normal.     Left Ear: External ear normal.     Mouth/Throat:     Pharynx: No oropharyngeal exudate or posterior oropharyngeal erythema.  Eyes:     General: No scleral icterus.       Right eye: No discharge.        Left eye: No discharge.     Conjunctiva/sclera: Conjunctivae normal.  Neck:     Thyroid : No thyromegaly.  Cardiovascular:     Rate and Rhythm: Normal rate and regular rhythm.  Pulmonary:     Effort: No respiratory distress.     Breath sounds: Normal breath sounds. No wheezing.  Abdominal:     General: Bowel sounds are normal.     Palpations: Abdomen is soft.     Tenderness: There is no abdominal tenderness.  Musculoskeletal:        General: No  swelling or tenderness.     Cervical back: Neck supple. No tenderness.  Lymphadenopathy:     Cervical: No cervical adenopathy.  Skin:    Findings: No erythema or rash.  Neurological:     Mental Status: She is alert.  Psychiatric:        Mood and Affect: Mood normal.        Behavior: Behavior normal.         Outpatient Encounter Medications as of 02/24/2024  Medication Sig   acetaminophen  (TYLENOL ) 500 MG tablet Take 1,000 mg by mouth every 6 (six) hours as needed for moderate pain.   albuterol  (VENTOLIN  HFA) 108 (90 Base) MCG/ACT inhaler Inhale 2 puffs into the lungs every 6 (six) hours as needed for wheezing or shortness of breath.   aspirin  EC 81 MG EC tablet Take 1 tablet (81 mg total) by mouth daily. Swallow whole.   Calcium  Carb-Cholecalciferol  (CALCIUM  500 +D) 500-400 MG-UNIT TABS Take 1 tablet by mouth daily.   Cholecalciferol  (VITAMIN D ) 2000 UNITS tablet Take 2,000 Units by mouth daily.   Cranberry 450 MG CAPS Take 450 mg by mouth daily.   denosumab  (PROLIA ) 60 MG/ML SOSY injection Inject 60 mg into the skin every 6 (six) months.   ibuprofen  (ADVIL ) 800 MG tablet Take 800 mg by mouth as needed.   omeprazole  (PRILOSEC) 20 MG capsule Take 1 capsule (20 mg total) by mouth daily.   pravastatin  (PRAVACHOL ) 80 MG tablet Take 1 tablet (80 mg total) by mouth daily.   traMADol  (ULTRAM ) 50 MG tablet Take 50 mg by mouth as needed.   TRELEGY ELLIPTA  100-62.5-25 MCG/ACT AEPB USE ONE INHALATION INTO THE LUNGS ONCE A DAY RINSE MOUTH AFTER EACH USE   Facility-Administered Encounter Medications as of 02/24/2024  Medication   denosumab  (PROLIA ) injection 60 mg     Lab Results  Component Value Date   WBC 5.6 12/10/2023   HGB 14.8  12/10/2023   HCT 44.4 12/10/2023   PLT 271 12/10/2023   GLUCOSE 98 02/17/2024   CHOL 189 02/17/2024   TRIG 102.0 02/17/2024   HDL 67.00 02/17/2024   LDLDIRECT 153.0 07/17/2015   LDLCALC 102 (H) 02/17/2024   ALT 12 02/17/2024   AST 14 02/17/2024   NA  140 02/17/2024   K 4.1 02/17/2024   CL 101 02/17/2024   CREATININE 0.83 02/17/2024   BUN 17 02/17/2024   CO2 31 02/17/2024   TSH 1.80 06/22/2023   INR 0.9 07/23/2021   HGBA1C 5.5 08/02/2021       Assessment & Plan:  Vitamin D  deficiency Assessment & Plan: Vitamin D  level 02/17/24 - wnl.    Hypercholesterolemia Assessment & Plan: Tolerating pravastatin  80mg  q day. Low cholesterol diet and exercise. Follow lipid panel. No changes today.   Orders: -     Lipid panel; Future -     Hepatic function panel; Future -     Basic metabolic panel with GFR; Future  Anemia, unspecified type -     CBC with Differential/Platelet; Future -     TSH; Future  Thyroid  nodule Assessment & Plan: Saw endocrinology 07/28/22.  Recommended f/u ultrasound and TSH in 12 months.  Thyroid  ultrasound 09/02/23 - stable. Continue f/u with endocrinology.    Stress Assessment & Plan: Overall appears to be doing well. Follow.    History of CVA (cerebrovascular accident) Assessment & Plan: Found to have acute corpus callosum infarct.  On plavix  and aspirin .  Symptoms improved.  Seeing Dr Maree. Continue aspirin  and daily pravastatin .    History of colonic polyps Assessment & Plan: Colonoscopy 06/2021.  Recommended f/u in 3 years.    Gastroesophageal reflux disease, unspecified whether esophagitis present Assessment & Plan: No upper symptoms reported. Continue prilosec.    Chronic obstructive pulmonary disease, unspecified COPD type (HCC) Assessment & Plan: Breathing stable. Continues on trelegy. No needing rescue inhaler. Follow.    Malignant neoplasm of lung, unspecified laterality, unspecified part of lung [facility]) Assessment & Plan: Has been seeing Dr Tamea for f/u lung nodule. Recent biopsy - non-small cell carcinoma. Recommended XRT. Saw Dr Camelia - SBRT to the right upper lobe nodule - completed. Occasional dry cough. Continue f/u with Dr Camelia and pulmonary.       Allena Hamilton,  MD

## 2024-02-25 ENCOUNTER — Ambulatory Visit
Admission: RE | Admit: 2024-02-25 | Discharge: 2024-02-25 | Disposition: A | Discharge status: Home / Self Care | Source: Ambulatory Visit | Attending: Radiation Oncology | Admitting: Radiation Oncology

## 2024-02-25 ENCOUNTER — Encounter: Payer: Self-pay | Admitting: Radiation Oncology

## 2024-02-25 VITALS — BP 134/77 | HR 81 | Temp 97.0°F | Resp 16 | Ht 62.0 in | Wt 192.0 lb

## 2024-02-25 DIAGNOSIS — C3411 Malignant neoplasm of upper lobe, right bronchus or lung: Secondary | ICD-10-CM | POA: Diagnosis present

## 2024-02-25 NOTE — Progress Notes (Signed)
 Radiation Oncology Follow up Note  Name: Alexis Reed   Date:   02/25/2024 MRN:  [id number] DOB: Dec 13, 1945    This 78 y.o. female presents to the clinic today for 1 month follow-up status post SBRT to right upper lobe for stage Ia non-small cell lung cancer.  REFERRING PROVIDER: Glendia Shad, MD  HPI: Patient is a 78 year old female now out 1 month having completed SBRT to her right upper lobe stage I A2 non-small cell lung cancer.  Seen today in routine follow-up she is doing well specifically [place] cough any change in her pulmonary status or any dysphagia.  She developed a rash about 2 weeks out from radiation although I have assured her her skin received almost no radiation from her SBRT treatment.  This may have been sun exposure or allergic reaction.  COMPLICATIONS OF TREATMENT: none  FOLLOW UP COMPLIANCE: keeps appointments   PHYSICAL EXAM:  BP 134/77   Pulse 81   Temp (!) 97 F (36.1 C) (Tympanic)   Resp 16   Ht 5' 2 (1.575 m)   Wt 192 lb (87.1 kg)   LMP 07/20/1995   BMI 35.12 kg/m  Well-developed well-nourished patient in NAD. HEENT reveals PERLA, EOMI, discs not visualized.  Oral cavity is clear. No oral mucosal lesions are identified. Neck is clear without evidence of cervical or supraclavicular adenopathy. Lungs are clear to A&P. Cardiac examination is essentially unremarkable with regular rate and rhythm without murmur rub or thrill. Abdomen is benign with no organomegaly or masses noted. Motor sensory and DTR levels are equal and symmetric in the upper and lower extremities. Cranial nerves II through XII are grossly intact. Proprioception is intact. No peripheral adenopathy or edema is identified. No motor or sensory levels are noted. Crude visual fields are within normal range.  Rash is completely resolved  RADIOLOGY RESULTS: CT scan of the chest ordered in 3 months  PLAN: Present time patient is doing well very low side effect profile from SBRT.  And pleased with  her overall progress.  Of asked to see her back in 3 months with a repeat CT scan of her chest at that time.  Patient comprehends my recommendations well.  Patient knows to call with any concerns.  I would like to take this opportunity to thank you for allowing me to participate in the care of your patient.SABRA Marcey Penton, MD

## 2024-02-26 ENCOUNTER — Telehealth: Payer: Self-pay | Admitting: Radiation Oncology

## 2024-02-26 NOTE — Telephone Encounter (Signed)
 Pt called to r/s CT and left vm - called pt back and left vm - LH

## 2024-03-01 ENCOUNTER — Encounter: Payer: Self-pay | Admitting: Radiation Oncology

## 2024-03-05 ENCOUNTER — Encounter: Payer: Self-pay | Admitting: Internal Medicine

## 2024-03-05 DIAGNOSIS — C3490 Malignant neoplasm of unspecified part of unspecified bronchus or lung: Secondary | ICD-10-CM | POA: Insufficient documentation

## 2024-03-05 NOTE — Assessment & Plan Note (Signed)
 Tolerating pravastatin  80mg  q day. Low cholesterol diet and exercise. Follow lipid panel. No changes today.

## 2024-03-05 NOTE — Assessment & Plan Note (Signed)
 Overall appears to be doing well.  Follow.

## 2024-03-05 NOTE — Assessment & Plan Note (Signed)
 Has been seeing Dr Tamea for f/u lung nodule. Recent biopsy - non-small cell carcinoma. Recommended XRT. Saw Dr Camelia - SBRT to the right upper lobe nodule - completed. Occasional dry cough. Continue f/u with Dr Camelia and pulmonary.

## 2024-03-05 NOTE — Assessment & Plan Note (Signed)
 Breathing stable. Continues on trelegy. No needing rescue inhaler. Follow.

## 2024-03-05 NOTE — Assessment & Plan Note (Signed)
Colonoscopy 06/2021.  Recommended f/u in 3 years.  

## 2024-03-05 NOTE — Assessment & Plan Note (Signed)
 Vitamin D  level 02/17/24 - wnl.

## 2024-03-05 NOTE — Assessment & Plan Note (Signed)
 Saw endocrinology 07/28/22.  Recommended f/u ultrasound and TSH in 12 months.  Thyroid  ultrasound 09/02/23 - stable. Continue f/u with endocrinology.

## 2024-03-05 NOTE — Assessment & Plan Note (Signed)
No upper symptoms reported.  Continue prilosec.  

## 2024-03-05 NOTE — Assessment & Plan Note (Signed)
 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  On plavix  and aspirin .  Symptoms improved.  Seeing Dr Maree. Continue aspirin  and daily pravastatin .

## 2024-05-05 ENCOUNTER — Encounter: Payer: Self-pay | Admitting: Radiation Oncology

## 2024-05-05 ENCOUNTER — Telehealth: Payer: Self-pay | Admitting: Radiation Oncology

## 2024-05-05 NOTE — Telephone Encounter (Signed)
 Called pt to confirm CT appt - left vm with appt details to confirm - [facility]

## 2024-05-11 ENCOUNTER — Other Ambulatory Visit

## 2024-05-18 ENCOUNTER — Other Ambulatory Visit

## 2024-05-23 ENCOUNTER — Ambulatory Visit
Admission: RE | Admit: 2024-05-23 | Discharge: 2024-05-23 | Disposition: A | Discharge status: Home / Self Care | Source: Ambulatory Visit | Attending: Radiation Oncology | Admitting: Radiation Oncology

## 2024-05-23 DIAGNOSIS — C3411 Malignant neoplasm of upper lobe, right bronchus or lung: Secondary | ICD-10-CM | POA: Diagnosis present

## 2024-05-23 MED ORDER — IOHEXOL 300 MG/ML  SOLN
75.0000 mL | Freq: Once | INTRAMUSCULAR | Status: AC | PRN
  Administered 2024-05-23: 75 mL via INTRAVENOUS

## 2024-06-01 ENCOUNTER — Other Ambulatory Visit: Payer: Self-pay | Admitting: *Deleted

## 2024-06-01 ENCOUNTER — Telehealth: Payer: Self-pay | Admitting: Radiation Oncology

## 2024-06-01 ENCOUNTER — Ambulatory Visit
Admission: RE | Admit: 2024-06-01 | Discharge: 2024-06-01 | Disposition: A | Discharge status: Home / Self Care | Source: Ambulatory Visit | Attending: Radiation Oncology | Admitting: Radiation Oncology

## 2024-06-01 ENCOUNTER — Encounter: Payer: Self-pay | Admitting: Radiation Oncology

## 2024-06-01 VITALS — BP 130/81 | HR 76 | Temp 98.2°F | Resp 16 | Wt 192.0 lb

## 2024-06-01 DIAGNOSIS — C3411 Malignant neoplasm of upper lobe, right bronchus or lung: Secondary | ICD-10-CM

## 2024-06-01 DIAGNOSIS — Z923 Personal history of irradiation: Secondary | ICD-10-CM | POA: Diagnosis not present

## 2024-06-01 NOTE — Telephone Encounter (Signed)
 Called pt to sched CT - left vm asking pt to call back when she can - [facility]

## 2024-06-01 NOTE — Progress Notes (Signed)
 Radiation Oncology Follow up Note  Name: Alexis Reed   Date:   06/01/2024 MRN:  [id number] DOB: [birth year]    This 78 y.o. female presents to the clinic today for 24-month follow-up status post SBRT to her right upper lobe for stage Ia non-small cell lung cancer.  REFERRING PROVIDER: Glendia Shad, MD  HPI: Patient is a 78 year old female now out 4 months having completed SBRT to her right upper lobe for stage Ia non-small cell lung cancer.  Seen today in routine follow-up she is doing well.  She is a slight dry cough.  She states she may be slightly little more short of breath although she is carrying on normal activity.  She is having no hemoptysis or fatigue at this time.  She had a recent CT scan.  Which shows decreased size of spiculated anterior right upper lobe lesion with surrounding postradiation changes compatible with treatment response no new pulmonary lesions or nodules noted no evidence of metastatic disease.  COMPLICATIONS OF TREATMENT: none  FOLLOW UP COMPLIANCE: keeps appointments   PHYSICAL EXAM:  BP 130/81   Pulse 76   Temp 98.2 F (36.8 C) (Tympanic)   Resp 16   Wt 192 lb (87.1 kg)   LMP 07/20/1995   BMI 35.12 kg/m  Well-developed well-nourished patient in NAD. HEENT reveals PERLA, EOMI, discs not visualized.  Oral cavity is clear. No oral mucosal lesions are identified. Neck is clear without evidence of cervical or supraclavicular adenopathy. Lungs are clear to A&P. Cardiac examination is essentially unremarkable with regular rate and rhythm without murmur rub or thrill. Abdomen is benign with no organomegaly or masses noted. Motor sensory and DTR levels are equal and symmetric in the upper and lower extremities. Cranial nerves II through XII are grossly intact. Proprioception is intact. No peripheral adenopathy or edema is identified. No motor or sensory levels are noted. Crude visual fields are within normal range.  RADIOLOGY RESULTS: CT scans reviewed  compatible with above-stated findings  PLAN: At the present time patient is clinically doing well very low side effect profile from SBRT.  I have reviewed CT scan showing excellent response with contraction of scar around the treated area.  I am pleased with her overall progress.  Of asked to see her back in 6 months with a follow-up CT scan of the chest at that time.  Patient knows to call with any concerns.  I would like to take this opportunity to thank you for allowing me to participate in the care of your patient.Alexis Marcey Penton, MD

## 2024-06-02 ENCOUNTER — Telehealth: Payer: Self-pay | Admitting: Radiation Oncology

## 2024-06-02 NOTE — Telephone Encounter (Signed)
 Called pt to sched - left vm asking for call back - LH

## 2024-06-03 ENCOUNTER — Telehealth: Payer: Self-pay | Admitting: Radiation Oncology

## 2024-06-03 ENCOUNTER — Encounter: Payer: Self-pay | Admitting: Radiation Oncology

## 2024-06-03 NOTE — Telephone Encounter (Signed)
 Called pt to sched CT - confirmed date/time/location - sent appt reminder via mail [facility]

## 2024-06-06 ENCOUNTER — Other Ambulatory Visit: Payer: Self-pay | Admitting: Internal Medicine

## 2024-06-15 ENCOUNTER — Encounter: Payer: Self-pay | Admitting: Radiation Oncology

## 2024-06-15 ENCOUNTER — Telehealth: Payer: Self-pay | Admitting: Radiation Oncology

## 2024-06-15 NOTE — Telephone Encounter (Signed)
 Pt called and requested to r/s CT to different date - r/s w/pt - pt confirmed date/time/location - requested appt reminder via mail - [facility]

## 2024-06-27 ENCOUNTER — Other Ambulatory Visit (INDEPENDENT_AMBULATORY_CARE_PROVIDER_SITE_OTHER)

## 2024-06-27 DIAGNOSIS — E7800 Pure hypercholesterolemia, unspecified: Secondary | ICD-10-CM

## 2024-06-27 DIAGNOSIS — D649 Anemia, unspecified: Secondary | ICD-10-CM | POA: Diagnosis not present

## 2024-06-27 LAB — BASIC METABOLIC PANEL WITH GFR
BUN: 19 mg/dL (ref 6–23)
CO2: 30 meq/L (ref 19–32)
Calcium: 9.4 mg/dL (ref 8.4–10.5)
Chloride: 105 meq/L (ref 96–112)
Creatinine, Ser: 0.78 mg/dL (ref 0.40–1.20)
GFR: 73.84 mL/min (ref >60.00)
Glucose, Bld: 91 mg/dL (ref 70–99)
Potassium: 4.5 meq/L (ref 3.5–5.1)
Sodium: 141 meq/L (ref 135–145)

## 2024-06-27 LAB — LIPID PANEL
Cholesterol: 191 mg/dL (ref 0–200)
HDL: 64.3 mg/dL (ref >39.00)
LDL Cholesterol: 107 mg/dL — ABNORMAL HIGH (ref 0–99)
NonHDL: 126.43
Total CHOL/HDL Ratio: 3
Triglycerides: 98 mg/dL (ref 0.0–149.0)
VLDL: 19.6 mg/dL (ref 0.0–40.0)

## 2024-06-27 LAB — TSH: TSH: 2 u[IU]/mL (ref 0.35–5.50)

## 2024-06-27 LAB — CBC WITH DIFFERENTIAL/PLATELET
Basophils Absolute: 0 K/uL (ref 0.0–0.1)
Basophils Relative: 1.1 % (ref 0.0–3.0)
Eosinophils Absolute: 0.2 K/uL (ref 0.0–0.7)
Eosinophils Relative: 4.2 % (ref 0.0–5.0)
HCT: 42.1 % (ref 36.0–46.0)
Hemoglobin: 14.1 g/dL (ref 12.0–15.0)
Lymphocytes Relative: 22.3 % (ref 12.0–46.0)
Lymphs Abs: 0.9 K/uL (ref 0.7–4.0)
MCHC: 33.4 g/dL (ref 30.0–36.0)
MCV: 94.2 fl (ref 78.0–100.0)
Monocytes Absolute: 0.4 K/uL (ref 0.1–1.0)
Monocytes Relative: 9.8 % (ref 3.0–12.0)
Neutro Abs: 2.5 K/uL (ref 1.4–7.7)
Neutrophils Relative %: 62.6 % (ref 43.0–77.0)
Platelets: 258 K/uL (ref 150.0–400.0)
RBC: 4.47 Mil/uL (ref 3.87–5.11)
RDW: 13.5 % (ref 11.5–15.5)
WBC: 4 K/uL (ref 4.0–10.5)

## 2024-06-27 LAB — HEPATIC FUNCTION PANEL
ALT: 12 U/L (ref 0–35)
AST: 12 U/L (ref 0–37)
Albumin: 4.3 g/dL (ref 3.5–5.2)
Alkaline Phosphatase: 83 U/L (ref 39–117)
Bilirubin, Direct: 0.1 mg/dL (ref 0.0–0.3)
Total Bilirubin: 0.6 mg/dL (ref 0.2–1.2)
Total Protein: 6.6 g/dL (ref 6.0–8.3)

## 2024-06-28 ENCOUNTER — Ambulatory Visit: Payer: Self-pay | Admitting: Internal Medicine

## 2024-06-29 ENCOUNTER — Ambulatory Visit (INDEPENDENT_AMBULATORY_CARE_PROVIDER_SITE_OTHER): Admitting: Internal Medicine

## 2024-06-29 ENCOUNTER — Encounter: Payer: Self-pay | Admitting: Internal Medicine

## 2024-06-29 VITALS — BP 122/72 | HR 73 | Temp 97.5°F | Ht 62.0 in | Wt 195.8 lb

## 2024-06-29 DIAGNOSIS — E7800 Pure hypercholesterolemia, unspecified: Secondary | ICD-10-CM | POA: Diagnosis not present

## 2024-06-29 DIAGNOSIS — K219 Gastro-esophageal reflux disease without esophagitis: Secondary | ICD-10-CM | POA: Diagnosis not present

## 2024-06-29 DIAGNOSIS — D649 Anemia, unspecified: Secondary | ICD-10-CM | POA: Diagnosis not present

## 2024-06-29 DIAGNOSIS — Z1231 Encounter for screening mammogram for malignant neoplasm of breast: Secondary | ICD-10-CM | POA: Diagnosis not present

## 2024-06-29 DIAGNOSIS — J449 Chronic obstructive pulmonary disease, unspecified: Secondary | ICD-10-CM | POA: Diagnosis not present

## 2024-06-29 DIAGNOSIS — Z860100 Personal history of colon polyps, unspecified: Secondary | ICD-10-CM | POA: Diagnosis not present

## 2024-06-29 DIAGNOSIS — E559 Vitamin D deficiency, unspecified: Secondary | ICD-10-CM | POA: Diagnosis not present

## 2024-06-29 DIAGNOSIS — C3490 Malignant neoplasm of unspecified part of unspecified bronchus or lung: Secondary | ICD-10-CM | POA: Diagnosis not present

## 2024-06-29 DIAGNOSIS — Z8673 Personal history of transient ischemic attack (TIA), and cerebral infarction without residual deficits: Secondary | ICD-10-CM

## 2024-06-29 DIAGNOSIS — Z1211 Encounter for screening for malignant neoplasm of colon: Secondary | ICD-10-CM | POA: Diagnosis not present

## 2024-06-29 DIAGNOSIS — E041 Nontoxic single thyroid nodule: Secondary | ICD-10-CM

## 2024-06-29 MED ORDER — EZETIMIBE 10 MG PO TABS: 10.0000 mg | ORAL_TABLET | Freq: Every day | ORAL | 1 refills | Status: AC

## 2024-06-29 MED ORDER — MOMETASONE FUROATE 0.1 % EX SOLN: Freq: Every day | CUTANEOUS | 0 refills | Status: AC

## 2024-06-29 NOTE — Progress Notes (Signed)
 "  Subjective:    Patient ID: Alexis Reed, female    DOB: 1946/01/13, 78 y.o.   MRN: [id number]  Patient here for  Chief Complaint  Patient presents with   Medical Management of Chronic Issues    HPI Here for a scheduled follow up. Had f/u with Dr Maree 09/01/23. F/u - h/o CVA. Recommended to continue aspirin  and pravastatin . Also discussed MCI. Consider donepezil. Had f/u 02/2024 - neurology - recommended to continue aspirin  and pravastatin . Seeing Dr Damian for f/u thyroid  nodules. Recommended f/u ultrasound. Has been seeing Dr Tamea for f/u lung nodule. Recent biopsy - non-small cell carcinoma. Recommended XRT. Saw Dr Camelia - SBRT to the right upper lobe nodule - completed. Last seen 05/2024 - recommended f/u 6 months with CT chest. Overall doing well. Seeing Dr Tye. Breathing stable. No chest pain reported. No abdominal pain or bowel change reported. Discussed recent labs. Discussed goal cholesterol. Discussed zetia .    Past Medical History:  Diagnosis Date   Arthritis    Benign breast lumps    multiple lumps biopsy and remove x's 5 last being 1979 by Dr. Rollene   COPD (chronic obstructive pulmonary disease) (HCC)    MILD-RARELY EVER HAS TO USE COMBIVENT  INHALER   Emphysema lung (HCC)    Fibroids    s/p hysterectomy   GERD (gastroesophageal reflux disease)    Hypercholesterolemia    Lung nodule    Osteoporosis    Personal history of tobacco use, presenting hazards to health 08/07/2015   Stroke (HCC) 01/23   Urinary tract infection    Past Surgical History:  Procedure Laterality Date   ABDOMINAL HYSTERECTOMY  1997   with bilateral oophorectomy   APPENDECTOMY     BREAST BIOPSY Left    multiple done   BREAST BIOPSY Right 2016   stereo- neg. FC changes   BREAST BIOPSY Left 06/02/2023   US  LT BREAST BX W LOC DEV 1ST LESION IMG BX SPEC US  GUIDE 06/02/2023 [facility]   BREAST EXCISIONAL BIOPSY Right 1975   multiple biopsies done   BREAST SURGERY Left    lump  removed. Unsure of date   BREAST SURGERY Right    lump removed. Unsure of date   BRONCHOSCOPY, WITH BIOPSY USING ELECTROMAGNETIC NAVIGATION Right 12/14/2023   Procedure: BRONCHOSCOPY, WITH BIOPSY USING ELECTROMAGNETIC NAVIGATION;  Surgeon: Tamea Dedra CROME, MD;  Location: [facility];  Service: Pulmonary;  Laterality: Right;   COLONOSCOPY  2010   Dr. Viktoria   COLONOSCOPY WITH PROPOFOL  N/A 06/17/2021   Procedure: COLONOSCOPY WITH PROPOFOL ;  Surgeon: Unk Corinn Skiff, MD;  Location: [facility] ENDOSCOPY;  Service: Gastroenterology;  Laterality: N/A;   ESOPHAGOGASTRODUODENOSCOPY (EGD) WITH PROPOFOL   06/17/2021   Procedure: ESOPHAGOGASTRODUODENOSCOPY (EGD) WITH PROPOFOL ;  Surgeon: Unk Corinn Skiff, MD;  Location: [facility] ENDOSCOPY;  Service: Gastroenterology;;   JOINT REPLACEMENT  01/23   KNEE ARTHROSCOPY WITH LATERAL MENISECTOMY Right 05/08/2020   Procedure: RIGHT KNEE ARTHROSCOPY WITH DEBRIDEMENT AND PARTIAL LATERAL MENISCECTOMY;  Surgeon: Edie Norleen PARAS, MD;  Location: [facility];  Service: Orthopedics;  Laterality: Right;   LAPAROSCOPIC APPENDECTOMY N/A 12/21/2018   Procedure: APPENDECTOMY LAPAROSCOPIC CONVERTED TO OPEN;  Surgeon: Tye Millet, DO;  Location: [facility];  Service: General;  Laterality: N/A;   TONSILLECTOMY     VIDEO BRONCHOSCOPY WITH ENDOBRONCHIAL ULTRASOUND Right 12/14/2023   Procedure: BRONCHOSCOPY, WITH EBUS;  Surgeon: Tamea Dedra CROME, MD;  Location: [facility];  Service: Pulmonary;  Laterality: Right;   Family History  Problem Relation Age of Onset  Hyperlipidemia Mother    Thyroid  disease Mother    Hypertension Mother    Urinary tract infection Mother    Colon cancer Father    Stroke Father    Heart disease Father        myocardial infarction   Breast cancer Neg Hx    Social History   Socioeconomic History   Marital status: Married    Spouse name: Not on file   Number of children: 2   Years of education: Not on file   Highest education level: Some college, no  degree  Occupational History   Not on file  Tobacco Use   Smoking status: Former    Current packs/day: 0.00    Average packs/day: 1 pack/day for 30.0 years (30.0 ttl pk-yrs)    Types: Cigarettes    Start date: 04/26/1974    Quit date: 04/26/2004    Years since quitting: 20.2    Passive exposure: Past   Smokeless tobacco: Never  Vaping Use   Vaping status: Never Used  Substance and Sexual Activity   Alcohol use: Yes    Alcohol/week: 0.0 standard drinks of alcohol    Comment: occassional wine every other day   Drug use: No   Sexual activity: Yes    Birth control/protection: Post-menopausal  Other Topics Concern   Not on file  Social History Narrative   She is married, has two children. 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   Social Drivers of Health   Tobacco Use: Medium Risk (07/08/2024)   Patient History    Smoking Tobacco Use: Former    Smokeless Tobacco Use: Never    Passive Exposure: Past  Physicist, Medical Strain: Low Risk (06/27/2024)   Overall Financial Resource Strain (CARDIA)    Difficulty of Paying Living Expenses: Not hard at all  Food Insecurity: No Food Insecurity (06/27/2024)   Epic    Worried About Programme Researcher, Broadcasting/film/video in the Last Year: Never true    Ran Out of Food in the Last Year: Never true  Transportation Needs: No Transportation Needs (06/27/2024)   Epic    Lack of Transportation (Medical): No    Lack of Transportation (Non-Medical): No  Physical Activity: Insufficiently Active (06/27/2024)   Exercise Vital Sign    Days of Exercise per Week: 1 day    Minutes of Exercise per Session: 30 min  Stress: No Stress Concern Present (06/27/2024)   [employer] of Occupational Health - Occupational Stress Questionnaire    Feeling of Stress: Not at all  Social Connections: Socially Integrated (06/27/2024)   Social Connection and Isolation Panel    Frequency of Communication with Friends and Family: More than three times a week    Frequency  of Social Gatherings with Friends and Family: Once a week    Attends Religious Services: More than 4 times per year    Active Member of Clubs or Organizations: Yes    Attends Banker Meetings: More than 4 times per year    Marital Status: Married  Depression (PHQ2-9): Low Risk (06/01/2024)   Depression (PHQ2-9)    PHQ-2 Score: 0  Alcohol Screen: Low Risk (06/27/2024)   Alcohol Screen    Last Alcohol Screening Score (AUDIT): 3  Housing: Low Risk (06/27/2024)   Epic    Unable to Pay for Housing in the Last Year: No    Number of Times Moved in the Last Year: 0    Homeless in the Last Year: No  Utilities: Not  At Risk (06/09/2023)   Received from [facility] Utilities    Threatened with loss of utilities: No  Health Literacy: Not on file     Review of Systems  Constitutional:  Negative for appetite change and unexpected weight change.  HENT:  Negative for congestion and sinus pressure.   Respiratory:  Negative for cough, chest tightness and shortness of breath.   Cardiovascular:  Negative for chest pain, palpitations and leg swelling.  Gastrointestinal:  Negative for abdominal pain, diarrhea, nausea and vomiting.  Genitourinary:  Negative for difficulty urinating and dysuria.  Musculoskeletal:  Negative for joint swelling and myalgias.  Skin:  Negative for color change and rash.  Neurological:  Negative for dizziness and headaches.  Psychiatric/Behavioral:  Negative for agitation and dysphoric mood.        Objective:     BP 122/72   Pulse 73   Temp (!) 97.5 F (36.4 C) (Oral)   Ht 5' 2 (1.575 m)   Wt 195 lb 12.8 oz (88.8 kg)   LMP 07/20/1995   SpO2 99%   BMI 35.81 kg/m  Wt Readings from Last 3 Encounters:  06/29/24 195 lb 12.8 oz (88.8 kg)  06/01/24 192 lb (87.1 kg)  02/25/24 192 lb (87.1 kg)    Physical Exam Vitals reviewed.  Constitutional:      General: She is not in acute distress.    Appearance: Normal appearance.   HENT:     Head: Normocephalic and atraumatic.     Right Ear: External ear normal.     Left Ear: External ear normal.     Mouth/Throat:     Pharynx: No oropharyngeal exudate or posterior oropharyngeal erythema.  Eyes:     General: No scleral icterus.       Right eye: No discharge.        Left eye: No discharge.     Conjunctiva/sclera: Conjunctivae normal.  Neck:     Thyroid : No thyromegaly.  Cardiovascular:     Rate and Rhythm: Normal rate and regular rhythm.  Pulmonary:     Effort: No respiratory distress.     Breath sounds: Normal breath sounds. No wheezing.  Abdominal:     General: Bowel sounds are normal.     Palpations: Abdomen is soft.     Tenderness: There is no abdominal tenderness.  Musculoskeletal:        General: No swelling or tenderness.     Cervical back: Neck supple. No tenderness.  Lymphadenopathy:     Cervical: No cervical adenopathy.  Skin:    Findings: No erythema or rash.  Neurological:     Mental Status: She is alert.  Psychiatric:        Mood and Affect: Mood normal.        Behavior: Behavior normal.         Outpatient Encounter Medications as of 06/29/2024  Medication Sig   acetaminophen  (TYLENOL ) 500 MG tablet Take 1,000 mg by mouth every 6 (six) hours as needed for moderate pain.   albuterol  (VENTOLIN  HFA) 108 (90 Base) MCG/ACT inhaler Inhale 2 puffs into the lungs every 6 (six) hours as needed for wheezing or shortness of breath.   aspirin  EC 81 MG EC tablet Take 1 tablet (81 mg total) by mouth daily. Swallow whole.   Calcium  Carb-Cholecalciferol  (CALCIUM  500 +D) 500-400 MG-UNIT TABS Take 1 tablet by mouth daily.   Cholecalciferol  (VITAMIN D ) 2000 UNITS tablet Take 2,000 Units by mouth daily.  Cranberry 450 MG CAPS Take 450 mg by mouth daily.   denosumab  (PROLIA ) 60 MG/ML SOSY injection Inject 60 mg into the skin every 6 (six) months.   ezetimibe  (ZETIA ) 10 MG tablet Take 1 tablet (10 mg total) by mouth daily.   ibuprofen  (ADVIL ) 800 MG  tablet Take 800 mg by mouth as needed.   mometasone  (ELOCON ) 0.1 % lotion Apply topically daily.   omeprazole  (PRILOSEC) 20 MG capsule TAKE 1 CAPSULE BY MOUTH ONCE DAILY   pravastatin  (PRAVACHOL ) 80 MG tablet Take 1 tablet (80 mg total) by mouth daily.   traMADol  (ULTRAM ) 50 MG tablet Take 50 mg by mouth as needed.   TRELEGY ELLIPTA  100-62.5-25 MCG/ACT AEPB USE ONE INHALATION INTO THE LUNGS ONCE A DAY RINSE MOUTH AFTER EACH USE   Facility-Administered Encounter Medications as of 06/29/2024  Medication   denosumab  (PROLIA ) injection 60 mg     Lab Results  Component Value Date   WBC 4.0 06/27/2024   HGB 14.1 06/27/2024   HCT 42.1 06/27/2024   PLT 258.0 06/27/2024   GLUCOSE 91 06/27/2024   CHOL 191 06/27/2024   TRIG 98.0 06/27/2024   HDL 64.30 06/27/2024   LDLDIRECT 153.0 07/17/2015   LDLCALC 107 (H) 06/27/2024   ALT 12 06/27/2024   AST 12 06/27/2024   NA 141 06/27/2024   K 4.5 06/27/2024   CL 105 06/27/2024   CREATININE 0.78 06/27/2024   BUN 19 06/27/2024   CO2 30 06/27/2024   TSH 2.00 06/27/2024   INR 0.9 07/23/2021   HGBA1C 5.5 08/02/2021       Assessment & Plan:  Encounter for screening mammogram for malignant neoplasm of breast  History of colonic polyps  Colon cancer screening  Anemia, unspecified type Assessment & Plan: Follow cbc.   Orders: -     CBC with Differential/Platelet; Future  Hypercholesterolemia Assessment & Plan: Tolerating pravastatin  80mg  q day. Low cholesterol diet and exercise. Discussed recent labs. Discussed LDL 107. Discussed goal LDL. Add zetia . Follow lipid panel.   Orders: -     Basic metabolic panel with GFR; Future -     Hepatic function panel; Future -     Lipid panel; Future  Vitamin D  deficiency Assessment & Plan: Vitamin D  level 02/17/24 - wnl.    Thyroid  nodule Assessment & Plan: Saw endocrinology 07/28/22.  Recommended f/u ultrasound and TSH in 12 months.  Thyroid  ultrasound 09/02/23 - stable. Continue f/u with  endocrinology.    Malignant neoplasm of lung, unspecified laterality, unspecified part of lung [facility]) Assessment & Plan: Has been seeing Dr Tamea for f/u lung nodule. Recent biopsy - non-small cell carcinoma. Recommended XRT. Saw Dr Camelia - SBRT to the right upper lobe nodule - completed. Last seen 05/2024 - recommended f/u 6 months with CT chest.    History of CVA (cerebrovascular accident) Assessment & Plan: Found to have acute corpus callosum infarct.  On plavix  and aspirin .  Symptoms improved.  Seeing Dr Maree. Continue aspirin  and daily pravastatin . Add zetia  as outlined - LDL above goal.    Gastroesophageal reflux disease, unspecified whether esophagitis present Assessment & Plan: No upper symptoms reported. Continue prilosec.    Chronic obstructive pulmonary disease, unspecified COPD type (HCC) Assessment & Plan: Continue trelegy. Breathing stable.    Other orders -     Ezetimibe ; Take 1 tablet (10 mg total) by mouth daily.  Dispense: 90 tablet; Refill: 1 -     Mometasone  Furoate; Apply topically daily.  Dispense: 60 mL; Refill: 0  Allena Hamilton, MD "

## 2024-06-29 NOTE — Assessment & Plan Note (Addendum)
 Tolerating pravastatin  80mg  q day. Low cholesterol diet and exercise. Discussed recent labs. Discussed LDL 107. Discussed goal LDL. Add zetia . Follow lipid panel.

## 2024-07-04 ENCOUNTER — Encounter: Payer: Self-pay | Admitting: Internal Medicine

## 2024-07-05 ENCOUNTER — Other Ambulatory Visit: Payer: Self-pay | Admitting: Surgery

## 2024-07-05 DIAGNOSIS — N63 Unspecified lump in unspecified breast: Secondary | ICD-10-CM

## 2024-07-08 ENCOUNTER — Encounter: Payer: Self-pay | Admitting: Internal Medicine

## 2024-07-08 NOTE — Assessment & Plan Note (Signed)
 Follow cbc.

## 2024-07-08 NOTE — Assessment & Plan Note (Signed)
Continue trelegy.  Breathing stable.   

## 2024-07-08 NOTE — Assessment & Plan Note (Signed)
 Has been seeing Dr Tamea for f/u lung nodule. Recent biopsy - non-small cell carcinoma. Recommended XRT. Saw Dr Camelia - SBRT to the right upper lobe nodule - completed. Last seen 05/2024 - recommended f/u 6 months with CT chest.

## 2024-07-08 NOTE — Assessment & Plan Note (Signed)
No upper symptoms reported.  Continue prilosec.  

## 2024-07-08 NOTE — Assessment & Plan Note (Signed)
 Vitamin D  level 02/17/24 - wnl.

## 2024-07-08 NOTE — Assessment & Plan Note (Signed)
 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  On plavix  and aspirin .  Symptoms improved.  Seeing Dr Maree. Continue aspirin  and daily pravastatin . Add zetia  as outlined - LDL above goal.

## 2024-07-08 NOTE — Assessment & Plan Note (Signed)
 Saw endocrinology 07/28/22.  Recommended f/u ultrasound and TSH in 12 months.  Thyroid  ultrasound 09/02/23 - stable. Continue f/u with endocrinology.

## 2024-07-26 ENCOUNTER — Other Ambulatory Visit (HOSPITAL_COMMUNITY): Payer: Self-pay

## 2024-07-26 ENCOUNTER — Telehealth: Payer: Self-pay

## 2024-07-26 NOTE — Telephone Encounter (Signed)
 Prolia  VOB initiated via [email or web address]  Next Prolia  inj DUE: 08/19/24

## 2024-07-27 ENCOUNTER — Other Ambulatory Visit (HOSPITAL_COMMUNITY): Payer: Self-pay

## 2024-07-27 NOTE — Telephone Encounter (Signed)
 Pt ready for scheduling for PROLIA  on or after : 08/19/24  Option# 1: Buy/Bill (Office supplied medication)  Out-of-pocket cost due at time of clinic visit: $580  Number of injection/visits approved: 2  Primary: [employer] Prolia  co-insurance: $40 Admin fee co-insurance: $40  Secondary: --- Prolia  co-insurance:  Admin fee co-insurance:   Medical Benefit Details: Date Benefits were checked: 07/26/24 Deductible: $0 Met of $500 Required/ Coinsurance: $40/ Admin Fee: $40  Prior Auth: APPROVED PA# [id number] Expiration Date: 01/11/24-01/10/25   # of doses approved: 2 ----------------------------------------------------------------------- Option# 2- Med Obtained from pharmacy:  Pharmacy benefit: Copay $--- (Paid to pharmacy) Admin Fee: --- (Pay at clinic)  Prior Auth: PLAN EXCLUSION PA# Expiration Date:   # of doses approved:   If patient wants fill through the pharmacy benefit please send prescription to: ---, and include estimated need by date in rx notes. Pharmacy will ship medication directly to the office.  Patient NOT eligible for Prolia  Copay Card. Copay Card can make patient's cost as little as $25. Link to apply: [email or web address]://[email or web address]  ** This summary of benefits is an estimation of the patient's out-of-pocket cost. Exact cost may very based on individual plan coverage.

## 2024-07-27 NOTE — Telephone Encounter (Signed)
 Alexis Reed

## 2024-07-30 ENCOUNTER — Other Ambulatory Visit: Payer: Self-pay | Admitting: Pulmonary Disease

## 2024-07-30 DIAGNOSIS — J449 Chronic obstructive pulmonary disease, unspecified: Secondary | ICD-10-CM

## 2024-08-05 ENCOUNTER — Ambulatory Visit
Admission: RE | Admit: 2024-08-05 | Discharge: 2024-08-05 | Disposition: A | Source: Ambulatory Visit | Attending: Surgery

## 2024-08-05 DIAGNOSIS — N63 Unspecified lump in unspecified breast: Secondary | ICD-10-CM

## 2024-08-05 DIAGNOSIS — N6325 Unspecified lump in the left breast, overlapping quadrants: Secondary | ICD-10-CM | POA: Insufficient documentation

## 2024-08-05 DIAGNOSIS — N6320 Unspecified lump in the left breast, unspecified quadrant: Secondary | ICD-10-CM | POA: Diagnosis present

## 2024-10-26 ENCOUNTER — Other Ambulatory Visit

## 2024-10-31 ENCOUNTER — Ambulatory Visit: Admitting: Internal Medicine

## 2024-11-23 ENCOUNTER — Ambulatory Visit

## 2024-11-29 ENCOUNTER — Other Ambulatory Visit

## 2024-11-30 ENCOUNTER — Ambulatory Visit: Admitting: Radiation Oncology
